# Patient Record
Sex: Male | Born: 1950 | Race: White | Hispanic: No | Smoking: Former smoker
Health system: Southern US, Community
[De-identification: ages and names within clinical notes are randomized; demographics above are authoritative.]

## PROBLEM LIST (undated history)

## (undated) DIAGNOSIS — I10 Essential (primary) hypertension: Secondary | ICD-10-CM

## (undated) DIAGNOSIS — J159 Unspecified bacterial pneumonia: Secondary | ICD-10-CM

## (undated) DIAGNOSIS — Z9889 Other specified postprocedural states: Secondary | ICD-10-CM

## (undated) DIAGNOSIS — J9811 Atelectasis: Secondary | ICD-10-CM

## (undated) DIAGNOSIS — E785 Hyperlipidemia, unspecified: Secondary | ICD-10-CM

## (undated) DIAGNOSIS — J449 Chronic obstructive pulmonary disease, unspecified: Secondary | ICD-10-CM

## (undated) DIAGNOSIS — K219 Gastro-esophageal reflux disease without esophagitis: Secondary | ICD-10-CM

## (undated) DIAGNOSIS — M1990 Unspecified osteoarthritis, unspecified site: Secondary | ICD-10-CM

## (undated) DIAGNOSIS — J45909 Unspecified asthma, uncomplicated: Secondary | ICD-10-CM

## (undated) DIAGNOSIS — J1282 Pneumonia due to coronavirus disease 2019: Secondary | ICD-10-CM

## (undated) DIAGNOSIS — R7303 Prediabetes: Secondary | ICD-10-CM

## (undated) DIAGNOSIS — R112 Nausea with vomiting, unspecified: Secondary | ICD-10-CM

## (undated) DIAGNOSIS — G629 Polyneuropathy, unspecified: Secondary | ICD-10-CM

## (undated) DIAGNOSIS — U071 COVID-19: Secondary | ICD-10-CM

## (undated) DIAGNOSIS — C61 Malignant neoplasm of prostate: Secondary | ICD-10-CM

## (undated) DIAGNOSIS — F4310 Post-traumatic stress disorder, unspecified: Secondary | ICD-10-CM

## (undated) HISTORY — PX: OTHER SURGICAL HISTORY: SHX169

---

## 2021-08-01 ENCOUNTER — Encounter (HOSPITAL_COMMUNITY): Payer: Self-pay

## 2021-08-01 ENCOUNTER — Other Ambulatory Visit: Payer: Self-pay

## 2021-08-01 ENCOUNTER — Ambulatory Visit (HOSPITAL_COMMUNITY)
Admission: EM | Admit: 2021-08-01 | Discharge: 2021-08-01 | Disposition: A | Discharge status: Home / Self Care | Payer: Medicare Other

## 2021-08-01 DIAGNOSIS — G629 Polyneuropathy, unspecified: Secondary | ICD-10-CM | POA: Diagnosis not present

## 2021-08-01 DIAGNOSIS — G479 Sleep disorder, unspecified: Secondary | ICD-10-CM | POA: Diagnosis not present

## 2021-08-01 DIAGNOSIS — Z760 Encounter for issue of repeat prescription: Secondary | ICD-10-CM | POA: Diagnosis not present

## 2021-08-01 DIAGNOSIS — G2581 Restless legs syndrome: Secondary | ICD-10-CM

## 2021-08-01 HISTORY — DX: Unspecified asthma, uncomplicated: J45.909

## 2021-08-01 HISTORY — DX: Malignant neoplasm of prostate: C61

## 2021-08-01 HISTORY — DX: Hyperlipidemia, unspecified: E78.5

## 2021-08-01 HISTORY — DX: Essential (primary) hypertension: I10

## 2021-08-01 HISTORY — DX: Gastro-esophageal reflux disease without esophagitis: K21.9

## 2021-08-01 MED ORDER — GABAPENTIN 300 MG PO CAPS
ORAL_CAPSULE | ORAL | 0 refills | Status: DC
Start: 2021-08-01 — End: 2023-03-30

## 2021-08-01 MED ORDER — TIZANIDINE HCL 2 MG PO CAPS: 2.0000 mg | ORAL_CAPSULE | Freq: Every evening | ORAL | 0 refills | Status: DC | PRN

## 2021-08-01 NOTE — ED Provider Notes (Signed)
[place]    CSN: [id number] Arrival date & time: 08/01/21  1558      History   Chief Complaint Chief Complaint  Patient presents with   Medication Refill    HPI [facility] Charles Weiss. is a 70 y.o. male.   Patient presents today requesting a refill of gabapentin.  He is new to the area and is in the process of establishing with a new primary care provider and has an appointment scheduled for Tuesday (08/05/2021).  He reports the medications he is primarily concerned about are the ones that manage his pain he has been having difficulty sleeping and is concerned that running out of these medications will exacerbate symptoms.  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  He also reports severe restless leg syndrome, that is only minimally improved with gabapentin.  He reports minimal sleep as result of symptoms and feels compelled to walk throughout the night as he gets a stinging/burning sensation throughout his legs.  He is open to any medications that may help provide some relief of the symptoms and allow him to rest.  He does have a history of hypertension but continues to take his medication as prescribed.  He intends to follow-up with primary care provider for ongoing management of this condition.  Denies any chest pain, shortness of breath, leg swelling.   Past Medical History:  Diagnosis Date   Asthma    GERD (gastroesophageal reflux disease)    Hyperlipidemia    Hypertension    Prostate cancer ([place])     There are no problems to display for this patient.   History reviewed. No pertinent surgical history.     Home Medications    Prior to Admission medications   Medication Sig Start Date End Date Taking? Authorizing Provider   acetaminophen (TYLENOL) 650 MG CR tablet Take 650 mg by mouth every 8 (eight) hours as needed for pain.   Yes [provider]  albuterol (VENTOLIN HFA) 108 (90 Base) MCG/ACT inhaler Inhale 2 puffs into the lungs every 6 (six) hours as needed for wheezing or shortness of breath.   Yes [provider]  Albuterol Sulfate 2.5 MG/0.5ML NEBU Take 1 each by nebulization.   Yes [provider]  amLODipine (NORVASC) 5 MG tablet Take 5 mg by mouth daily.   Yes [provider]  aspirin 81 MG chewable tablet Chew by mouth daily.   Yes [provider]  Cholecalciferol (VITAMIN D3) 25 MCG (1000 UT) CAPS Take by mouth.   Yes [provider]  ciclesonide (ALVESCO) 80 MCG/ACT inhaler Inhale 1 puff into the lungs 2 (two) times daily.   Yes [provider]  Dextran 70-Hypromellose 0.1-0.3 % SOLN Apply to eye.   Yes [provider]  docusate sodium (COLACE) 100 MG capsule Take 100 mg by mouth 2 (two) times daily.   Yes [provider]  hydrochlorothiazide (MICROZIDE) 12.5 MG capsule Take 12.5 mg by mouth daily.   Yes [provider]  indomethacin (INDOCIN SR) 75 MG CR capsule Take 75 mg by mouth 2 (two) times daily with a meal.   Yes [provider]  ipratropium (ATROVENT) 0.02 % nebulizer solution Take 0.5 mg by nebulization 4 (four) times daily.   Yes [provider]  ketotifen (ZADITOR) 0.025 % ophthalmic solution 1 drop 2 (two) times daily.   Yes [provider]  loratadine (CLARITIN) 10 MG tablet Take 10 mg by mouth daily.   Yes [provider]  losartan (COZAAR) 50 MG tablet Take 50 mg by mouth daily.   Yes [provider]  mirabegron ER (MYRBETRIQ) 25 MG TB24 tablet Take 25 mg by mouth daily.   Yes [provider]  omeprazole (PRILOSEC) 20 MG capsule Take 20 mg by mouth daily.   Yes [provider]  phenazopyridine (PYRIDIUM) 200 MG tablet Take 200 mg by mouth  3 (three) times daily as needed for pain.   Yes [provider]  polyethylene glycol (MIRALAX / GLYCOLAX) 17 g packet Take 17 g by mouth daily.   Yes [provider]  senna-docusate (SENOKOT-S) 8.6-50 MG tablet Take 1 tablet by mouth daily.   Yes [provider]  simvastatin (ZOCOR) 40 MG tablet Take 40 mg by mouth daily.   Yes [provider]  tamsulosin (FLOMAX) 0.4 MG CAPS capsule Take 0.4 mg by mouth.   Yes [provider]  tizanidine (ZANAFLEX) 2 MG capsule Take 1 capsule (2 mg total) by mouth at bedtime as needed for muscle spasms. 08/01/21  Yes Rosina Cressler K, PA-C  triamcinolone (NASACORT) 55 MCG/ACT AERO nasal inhaler Place 2 sprays into the nose daily.   Yes [provider]  trospium (SANCTURA) 20 MG tablet Take 20 mg by mouth 2 (two) times daily.   Yes [provider]  gabapentin (NEURONTIN) 300 MG capsule Take 1 tablet (300 mg) in the morning and 2 tablets (600 mg) before bed. 08/01/21   Jahzier Villalon, Derry Skill, PA-C    Family History History reviewed. No pertinent family history.  Social History Social History   Tobacco Use   Smoking status: Every Day    Types: Cigarettes   Smokeless tobacco: Never  Vaping Use   Vaping Use: Never used  Substance Use Topics   Alcohol use: Never   Drug use: Never     Allergies   Elavil [amitriptyline] and Tetracyclines & related   Review of Systems Review of Systems  Constitutional:  Positive for activity change and fatigue. Negative for appetite change and fever.  Respiratory:  Negative for cough and shortness of breath.   Cardiovascular:  Negative for chest pain, palpitations and leg swelling.  Musculoskeletal:  Positive for myalgias. Negative for arthralgias.  Neurological:  Negative for dizziness, light-headedness and headaches.  Psychiatric/Behavioral:  Positive for sleep disturbance.     Physical Exam Triage Vital Signs ED Triage Vitals  Enc Vitals Group     BP  08/01/21 1715 (!) 147/100     Pulse Rate 08/01/21 1715 90     Resp 08/01/21 1715 20     Temp 08/01/21 1715 98.5 F (36.9 C)     Temp Source 08/01/21 1715 Oral     SpO2 08/01/21 1715 97 %     Weight --      Height --      Head Circumference --      Peak Flow --      Pain Score 08/01/21 1709 0     Pain Loc --      Pain Edu? --      Excl. in [place]? --    No data found.  Updated Vital Signs BP (!) 147/100 (BP Location: Right Arm)   Pulse 90   Temp 98.5 F (36.9 C) (Oral)   Resp  20   SpO2 97%   Visual Acuity Right Eye Distance:   Left Eye Distance:   Bilateral Distance:    Right Eye Near:   Left Eye Near:    Bilateral Near:     Physical Exam Vitals reviewed.  Constitutional:      General: He is awake.     Appearance: Normal appearance. He is normal weight. He is not ill-appearing.     Comments: Very pleasant male appears stated age in no acute distress sitting comfortably in exam room  HENT:     Head: Normocephalic and atraumatic.     Mouth/Throat:     Pharynx: No oropharyngeal exudate, posterior oropharyngeal erythema or uvula swelling.  Cardiovascular:     Rate and Rhythm: Normal rate and regular rhythm.     Heart sounds: Normal heart sounds, S1 normal and S2 normal. No murmur heard.    Comments: Negative Homans' sign bilaterally Pulmonary:     Effort: Pulmonary effort is normal.     Breath sounds: Normal breath sounds. No stridor. No wheezing, rhonchi or rales.     Comments: Clear to auscultation bilaterally Abdominal:     Palpations: Abdomen is soft.     Tenderness: There is no abdominal tenderness.  Musculoskeletal:     Right lower leg: No deformity, tenderness or bony tenderness. No edema.     Left lower leg: No deformity, tenderness or bony tenderness. No edema.  Neurological:     Mental Status: He is alert.  Psychiatric:        Behavior: Behavior is cooperative.     UC Treatments / Results  Labs (all labs ordered are listed, but only abnormal  results are displayed) Labs Reviewed - No data to display  EKG   Radiology No results found.  Procedures Procedures (including critical care time)  Medications Ordered in UC Medications - No data to display  Initial Impression / Assessment and Plan / UC Course  I have reviewed the triage vital signs and the nursing notes.  Pertinent labs & imaging results that were available during my care of the patient were reviewed by me and considered in my medical decision making (see chart for details).      Patient was prescribed slightly higher dose of gabapentin to help manage symptoms with 300 mg for the day and 600 mg at night.  We will try low-dose tizanidine (2 mg) at night to see if this will help provide some relief of symptoms and allow him to rest.  Discussed conservative treatment measures including heating pads, stretch, massage for additional symptom relief.  Discussed the importance of following up with primary care for ongoing medication management.  Discussed alarm symptoms that warrant emergent evaluation.  Strict return precautions given to which patient expressed understanding.  Final Clinical Impressions(s) / UC Diagnoses   Final diagnoses:  Neuropathy  Restless leg  Sleep disturbance  Medication refill     Discharge Instructions      Take a slightly higher dose of gabapentin as we discussed to see if this will help manage your symptoms.  It is very important that you follow-up with primary care as we discussed.  I have called in tizanidine to be used at night.  This make you sleepy so do not drive or drink alcohol while taking it.  I recommend that you use heat, rest, stretch for additional symptom relief.  If you have any worsening symptoms please return for reevaluation as we discussed.     ED  Prescriptions     Medication Sig Dispense Auth. Provider   gabapentin (NEURONTIN) 300 MG capsule Take 1 tablet (300 mg) in the morning and 2 tablets (600 mg) before  bed. 90 capsule Muath Hallam K, PA-C   tizanidine (ZANAFLEX) 2 MG capsule Take 1 capsule (2 mg total) by mouth at bedtime as needed for muscle spasms. 10 capsule Latronda Spink, Derry Skill, PA-C      PDMP not reviewed this encounter.   Terrilee Croak, PA-C 08/01/21 1838

## 2021-08-01 NOTE — Discharge Instructions (Addendum)
Take a slightly higher dose of gabapentin as we discussed to see if this will help manage your symptoms.  It is very important that you follow-up with primary care as we discussed.  I have called in tizanidine to be used at night.  This make you sleepy so do not drive or drink alcohol while taking it.  I recommend that you use heat, rest, stretch for additional symptom relief.  If you have any worsening symptoms please return for reevaluation as we discussed.

## 2021-08-01 NOTE — ED Triage Notes (Signed)
Pt requesting medication refill for gabapentin.

## 2021-10-24 ENCOUNTER — Emergency Department (HOSPITAL_COMMUNITY): Payer: Medicare Other

## 2021-10-24 ENCOUNTER — Other Ambulatory Visit: Payer: Self-pay

## 2021-10-24 ENCOUNTER — Inpatient Hospital Stay (HOSPITAL_COMMUNITY)
Admission: EM | Admit: 2021-10-24 | Discharge: 2021-10-28 | DRG: 193 | Disposition: A | Discharge status: Home Health | Payer: Medicare Other | Attending: Internal Medicine | Admitting: Internal Medicine

## 2021-10-24 ENCOUNTER — Encounter (HOSPITAL_COMMUNITY): Payer: Self-pay | Admitting: Internal Medicine

## 2021-10-24 DIAGNOSIS — K5900 Constipation, unspecified: Secondary | ICD-10-CM | POA: Diagnosis present

## 2021-10-24 DIAGNOSIS — E871 Hypo-osmolality and hyponatremia: Secondary | ICD-10-CM | POA: Diagnosis present

## 2021-10-24 DIAGNOSIS — J101 Influenza due to other identified influenza virus with other respiratory manifestations: Secondary | ICD-10-CM | POA: Diagnosis present

## 2021-10-24 DIAGNOSIS — K219 Gastro-esophageal reflux disease without esophagitis: Secondary | ICD-10-CM | POA: Diagnosis present

## 2021-10-24 DIAGNOSIS — Z8546 Personal history of malignant neoplasm of prostate: Secondary | ICD-10-CM | POA: Diagnosis not present

## 2021-10-24 DIAGNOSIS — E876 Hypokalemia: Secondary | ICD-10-CM | POA: Diagnosis present

## 2021-10-24 DIAGNOSIS — Z8616 Personal history of COVID-19: Secondary | ICD-10-CM | POA: Diagnosis not present

## 2021-10-24 DIAGNOSIS — E669 Obesity, unspecified: Secondary | ICD-10-CM | POA: Diagnosis present

## 2021-10-24 DIAGNOSIS — E785 Hyperlipidemia, unspecified: Secondary | ICD-10-CM | POA: Diagnosis present

## 2021-10-24 DIAGNOSIS — Z6833 Body mass index (BMI) 33.0-33.9, adult: Secondary | ICD-10-CM

## 2021-10-24 DIAGNOSIS — I10 Essential (primary) hypertension: Secondary | ICD-10-CM | POA: Diagnosis present

## 2021-10-24 DIAGNOSIS — Z79899 Other long term (current) drug therapy: Secondary | ICD-10-CM

## 2021-10-24 DIAGNOSIS — Z20822 Contact with and (suspected) exposure to covid-19: Secondary | ICD-10-CM | POA: Diagnosis present

## 2021-10-24 DIAGNOSIS — J9601 Acute respiratory failure with hypoxia: Secondary | ICD-10-CM | POA: Diagnosis present

## 2021-10-24 DIAGNOSIS — D649 Anemia, unspecified: Secondary | ICD-10-CM | POA: Diagnosis present

## 2021-10-24 DIAGNOSIS — Z8249 Family history of ischemic heart disease and other diseases of the circulatory system: Secondary | ICD-10-CM | POA: Diagnosis not present

## 2021-10-24 DIAGNOSIS — Z888 Allergy status to other drugs, medicaments and biological substances status: Secondary | ICD-10-CM

## 2021-10-24 DIAGNOSIS — Z7982 Long term (current) use of aspirin: Secondary | ICD-10-CM

## 2021-10-24 DIAGNOSIS — J45901 Unspecified asthma with (acute) exacerbation: Secondary | ICD-10-CM | POA: Diagnosis present

## 2021-10-24 DIAGNOSIS — F17210 Nicotine dependence, cigarettes, uncomplicated: Secondary | ICD-10-CM | POA: Diagnosis present

## 2021-10-24 DIAGNOSIS — J45909 Unspecified asthma, uncomplicated: Secondary | ICD-10-CM | POA: Diagnosis present

## 2021-10-24 DIAGNOSIS — R0602 Shortness of breath: Secondary | ICD-10-CM

## 2021-10-24 HISTORY — DX: Unspecified bacterial pneumonia: J15.9

## 2021-10-24 HISTORY — DX: Atelectasis: J98.11

## 2021-10-24 HISTORY — DX: COVID-19: U07.1

## 2021-10-24 HISTORY — DX: Pneumonia due to coronavirus disease 2019: J12.82

## 2021-10-24 LAB — CBC WITH DIFFERENTIAL/PLATELET
Abs Immature Granulocytes: 0.01 10*3/uL (ref 0.00–0.07)
Basophils Absolute: 0 10*3/uL (ref 0.0–0.1)
Basophils Relative: 1 %
Eosinophils Absolute: 0 10*3/uL (ref 0.0–0.5)
Eosinophils Relative: 0 %
HCT: 34.8 % — ABNORMAL LOW (ref 39.0–52.0)
Hemoglobin: 11.9 g/dL — ABNORMAL LOW (ref 13.0–17.0)
Immature Granulocytes: 0 %
Lymphocytes Relative: 15 %
Lymphs Abs: 0.6 10*3/uL — ABNORMAL LOW (ref 0.7–4.0)
MCH: 31 pg (ref 26.0–34.0)
MCHC: 34.2 g/dL (ref 30.0–36.0)
MCV: 90.6 fL (ref 80.0–100.0)
Monocytes Absolute: 0.7 10*3/uL (ref 0.1–1.0)
Monocytes Relative: 17 %
Neutro Abs: 2.8 10*3/uL (ref 1.7–7.7)
Neutrophils Relative %: 67 %
Platelets: 151 10*3/uL (ref 150–400)
RBC: 3.84 MIL/uL — ABNORMAL LOW (ref 4.22–5.81)
RDW: 14.6 % (ref 11.5–15.5)
WBC: 4.2 10*3/uL (ref 4.0–10.5)
nRBC: 0 % (ref 0.0–0.2)

## 2021-10-24 LAB — COMPREHENSIVE METABOLIC PANEL
ALT: 27 U/L (ref 0–44)
AST: 33 U/L (ref 15–41)
Albumin: 3.5 g/dL (ref 3.5–5.0)
Alkaline Phosphatase: 64 U/L (ref 38–126)
Anion gap: 12 (ref 5–15)
BUN: 18 mg/dL (ref 8–23)
CO2: 23 mmol/L (ref 22–32)
Calcium: 8.6 mg/dL — ABNORMAL LOW (ref 8.9–10.3)
Chloride: 98 mmol/L (ref 98–111)
Creatinine, Ser: 1.03 mg/dL (ref 0.61–1.24)
GFR, Estimated: >60 mL/min (ref >60)
Glucose, Bld: 148 mg/dL — ABNORMAL HIGH (ref 70–99)
Potassium: 3.1 mmol/L — ABNORMAL LOW (ref 3.5–5.1)
Sodium: 133 mmol/L — ABNORMAL LOW (ref 135–145)
Total Bilirubin: 0.5 mg/dL (ref 0.3–1.2)
Total Protein: 6.7 g/dL (ref 6.5–8.1)

## 2021-10-24 LAB — RESP PANEL BY RT-PCR (FLU A&B, COVID) ARPGX2
Influenza A by PCR: POSITIVE — AB
Influenza B by PCR: NEGATIVE
SARS Coronavirus 2 by RT PCR: NEGATIVE

## 2021-10-24 LAB — TROPONIN I (HIGH SENSITIVITY)
Troponin I (High Sensitivity): 12 ng/L (ref <18)
Troponin I (High Sensitivity): 11 ng/L (ref <18)

## 2021-10-24 LAB — BRAIN NATRIURETIC PEPTIDE: B Natriuretic Peptide: 59 pg/mL (ref 0.0–100.0)

## 2021-10-24 LAB — PHOSPHORUS: Phosphorus: 4 mg/dL (ref 2.5–4.6)

## 2021-10-24 LAB — MAGNESIUM: Magnesium: 2 mg/dL (ref 1.7–2.4)

## 2021-10-24 IMAGING — DX DG CHEST 1V PORT
1 series · 1 of 1 positions shown · non-contrast
Comparison: None.

CLINICAL DATA: Shortness of breath

EXAM:
PORTABLE CHEST 1 VIEW

[chest ap]
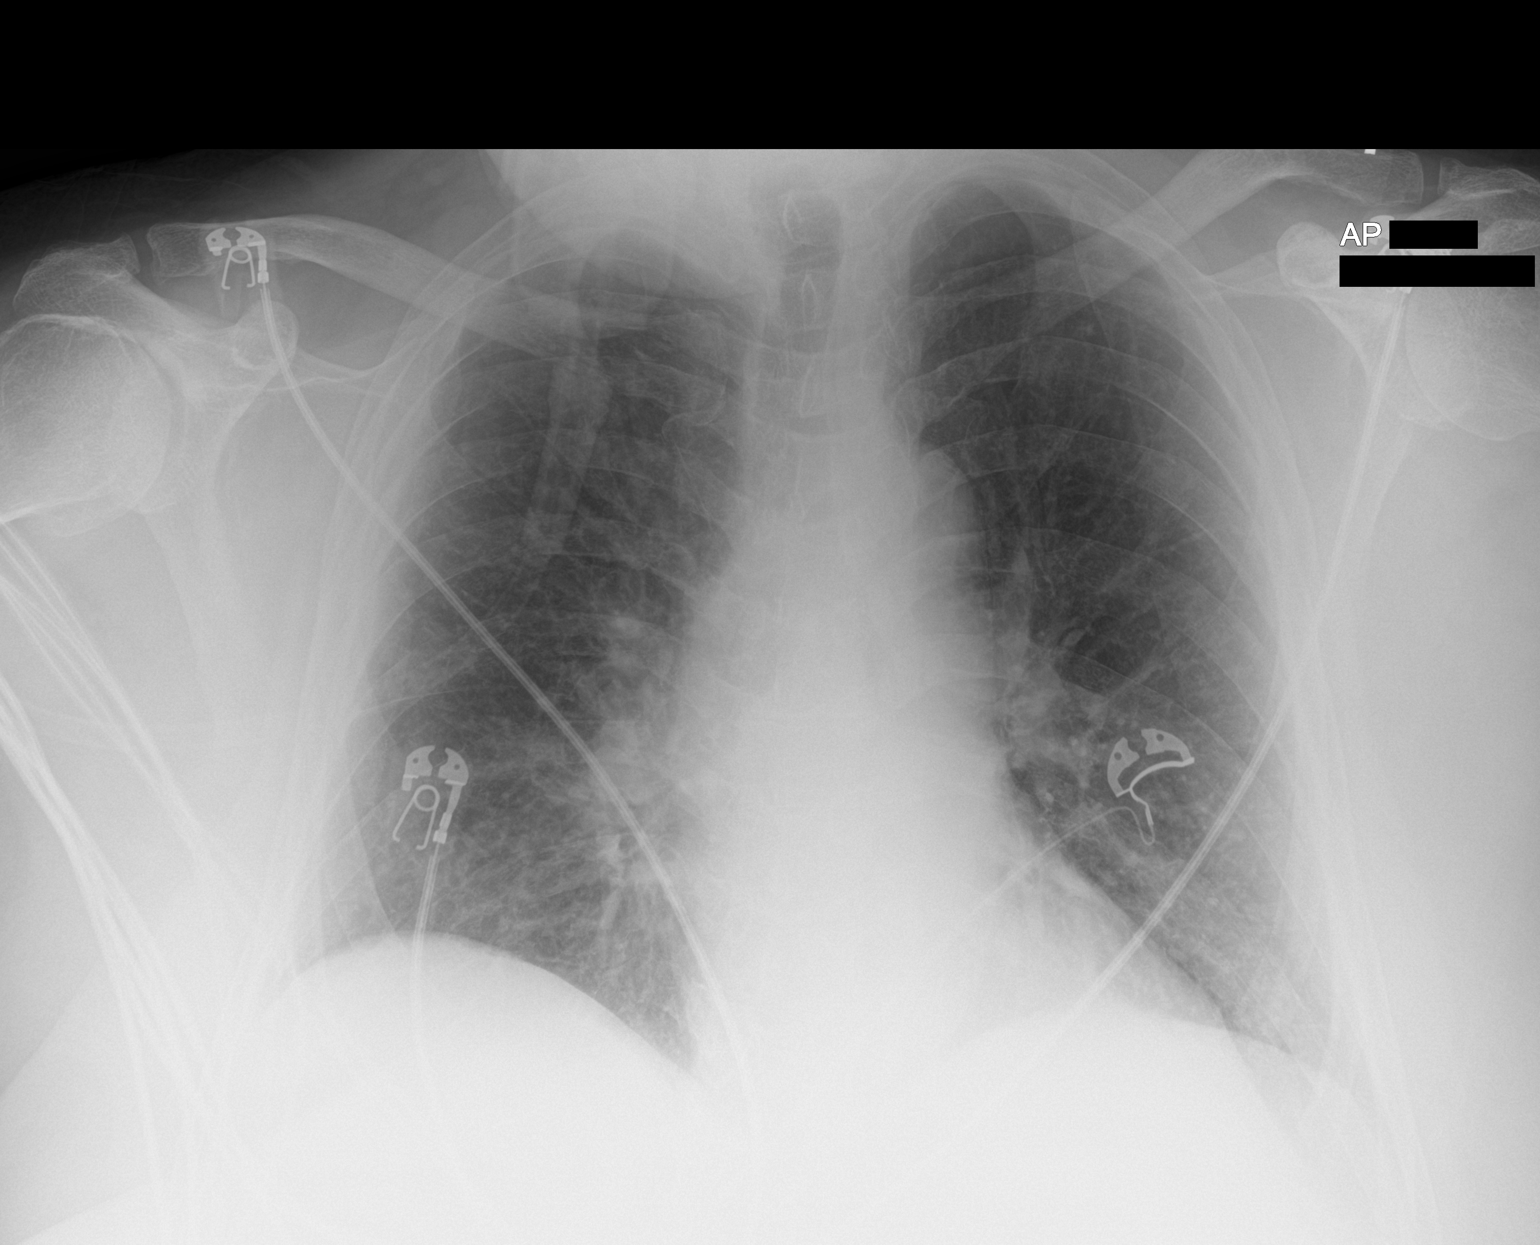

[1 of 1 positions shown; findings below may reference images not displayed]

FINDINGS: The heart size and mediastinal contours are within normal limits.
Both lungs are clear. The visualized skeletal structures are
unremarkable.
IMPRESSION: No active disease.

## 2021-10-24 MED ORDER — SODIUM CHLORIDE 0.9 % IV BOLUS
500.0000 mL | Freq: Once | INTRAVENOUS | Status: AC
  Administered 2021-10-24: 500 mL via INTRAVENOUS

## 2021-10-24 MED ORDER — BENZONATATE 100 MG PO CAPS
200.0000 mg | ORAL_CAPSULE | Freq: Three times a day (TID) | ORAL | Status: DC | PRN
  Filled 2021-10-24: qty 2

## 2021-10-24 MED ORDER — HYDROCOD POLST-CPM POLST ER 10-8 MG/5ML PO SUER
5.0000 mL | Freq: Two times a day (BID) | ORAL | Status: DC | PRN
  Administered 2021-10-24 – 2021-10-28 (×8): 5 mL via ORAL
  Filled 2021-10-24 (×8): qty 5

## 2021-10-24 MED ORDER — GABAPENTIN 300 MG PO CAPS
600.0000 mg | ORAL_CAPSULE | Freq: Every day | ORAL | Status: DC
  Administered 2021-10-24 – 2021-10-27 (×4): 600 mg via ORAL
  Filled 2021-10-24 (×4): qty 2

## 2021-10-24 MED ORDER — GABAPENTIN 300 MG PO CAPS
300.0000 mg | ORAL_CAPSULE | Freq: Every day | ORAL | Status: DC
  Administered 2021-10-24 – 2021-10-28 (×5): 300 mg via ORAL
  Filled 2021-10-24 (×5): qty 1

## 2021-10-24 MED ORDER — IPRATROPIUM-ALBUTEROL 0.5-2.5 (3) MG/3ML IN SOLN
3.0000 mL | Freq: Once | RESPIRATORY_TRACT | Status: AC
  Administered 2021-10-24: 3 mL via RESPIRATORY_TRACT
  Filled 2021-10-24: qty 3

## 2021-10-24 MED ORDER — SIMVASTATIN 40 MG PO TABS
40.0000 mg | ORAL_TABLET | Freq: Every day | ORAL | Status: DC
  Administered 2021-10-24: 40 mg via ORAL
  Filled 2021-10-24: qty 2

## 2021-10-24 MED ORDER — POTASSIUM CHLORIDE IN NACL 20-0.9 MEQ/L-% IV SOLN
INTRAVENOUS | Status: DC
  Filled 2021-10-24 (×2): qty 1000

## 2021-10-24 MED ORDER — LOSARTAN POTASSIUM 50 MG PO TABS
50.0000 mg | ORAL_TABLET | Freq: Every day | ORAL | Status: DC
  Administered 2021-10-24 – 2021-10-28 (×5): 50 mg via ORAL
  Filled 2021-10-24 (×4): qty 1
  Filled 2021-10-24: qty 2

## 2021-10-24 MED ORDER — METHYLPREDNISOLONE SODIUM SUCC 40 MG IJ SOLR
40.0000 mg | Freq: Once | INTRAMUSCULAR | Status: AC
Start: 2021-10-24 — End: 2021-10-24
  Administered 2021-10-24: 40 mg via INTRAVENOUS
  Filled 2021-10-24: qty 1

## 2021-10-24 MED ORDER — POTASSIUM CHLORIDE CRYS ER 20 MEQ PO TBCR
40.0000 meq | EXTENDED_RELEASE_TABLET | Freq: Once | ORAL | Status: AC
  Administered 2021-10-24: 40 meq via ORAL
  Filled 2021-10-24: qty 2

## 2021-10-24 MED ORDER — ONDANSETRON HCL 4 MG/2ML IJ SOLN: 4.0000 mg | Freq: Four times a day (QID) | INTRAMUSCULAR | Status: DC | PRN

## 2021-10-24 MED ORDER — PHENOL 1.4 % MT LIQD
1.0000 | OROMUCOSAL | Status: DC | PRN
  Administered 2021-10-24: 1 via OROMUCOSAL
  Filled 2021-10-24: qty 177

## 2021-10-24 MED ORDER — ASPIRIN 81 MG PO CHEW
81.0000 mg | CHEWABLE_TABLET | Freq: Every day | ORAL | Status: DC
  Administered 2021-10-24 – 2021-10-28 (×5): 81 mg via ORAL
  Filled 2021-10-24 (×5): qty 1

## 2021-10-24 MED ORDER — BENZONATATE 100 MG PO CAPS
200.0000 mg | ORAL_CAPSULE | Freq: Once | ORAL | Status: AC
  Administered 2021-10-24: 200 mg via ORAL
  Filled 2021-10-24: qty 2

## 2021-10-24 MED ORDER — AMLODIPINE BESYLATE 5 MG PO TABS
5.0000 mg | ORAL_TABLET | Freq: Every day | ORAL | Status: DC
  Administered 2021-10-25 – 2021-10-28 (×4): 5 mg via ORAL
  Filled 2021-10-24 (×4): qty 1

## 2021-10-24 MED ORDER — ENOXAPARIN SODIUM 60 MG/0.6ML IJ SOSY
60.0000 mg | PREFILLED_SYRINGE | INTRAMUSCULAR | Status: DC
  Administered 2021-10-24 – 2021-10-28 (×5): 60 mg via SUBCUTANEOUS
  Filled 2021-10-24 (×5): qty 0.6

## 2021-10-24 MED ORDER — ONDANSETRON HCL 4 MG PO TABS: 4.0000 mg | ORAL_TABLET | Freq: Four times a day (QID) | ORAL | Status: DC | PRN

## 2021-10-24 MED ORDER — ACETAMINOPHEN 325 MG PO TABS
650.0000 mg | ORAL_TABLET | Freq: Four times a day (QID) | ORAL | Status: DC | PRN
  Administered 2021-10-24 – 2021-10-28 (×5): 650 mg via ORAL
  Filled 2021-10-24 (×5): qty 2

## 2021-10-24 MED ORDER — GABAPENTIN 300 MG PO CAPS
300.0000 mg | ORAL_CAPSULE | ORAL | Status: DC
Start: 2021-10-24 — End: 2021-10-24

## 2021-10-24 MED ORDER — MAGNESIUM SULFATE 2 GM/50ML IV SOLN
2.0000 g | Freq: Once | INTRAVENOUS | Status: AC
  Administered 2021-10-24: 2 g via INTRAVENOUS
  Filled 2021-10-24: qty 50

## 2021-10-24 MED ORDER — PANTOPRAZOLE SODIUM 40 MG PO TBEC
40.0000 mg | DELAYED_RELEASE_TABLET | Freq: Every day | ORAL | Status: DC
  Administered 2021-10-24 – 2021-10-28 (×5): 40 mg via ORAL
  Filled 2021-10-24 (×5): qty 1

## 2021-10-24 MED ORDER — ALBUTEROL SULFATE (2.5 MG/3ML) 0.083% IN NEBU
2.5000 mg | INHALATION_SOLUTION | Freq: Once | RESPIRATORY_TRACT | Status: AC
  Administered 2021-10-24: 2.5 mg via RESPIRATORY_TRACT
  Filled 2021-10-24: qty 3

## 2021-10-24 MED ORDER — ACETAMINOPHEN 325 MG PO TABS
650.0000 mg | ORAL_TABLET | Freq: Once | ORAL | Status: AC
  Administered 2021-10-24: 650 mg via ORAL
  Filled 2021-10-24: qty 2

## 2021-10-24 MED ORDER — GUAIFENESIN 100 MG/5ML PO LIQD
10.0000 mL | Freq: Once | ORAL | Status: AC
  Administered 2021-10-24: 10 mL via ORAL
  Filled 2021-10-24: qty 10

## 2021-10-24 MED ORDER — OSELTAMIVIR PHOSPHATE 75 MG PO CAPS
75.0000 mg | ORAL_CAPSULE | Freq: Two times a day (BID) | ORAL | Status: DC
  Administered 2021-10-24 – 2021-10-26 (×5): 75 mg via ORAL
  Filled 2021-10-24 (×6): qty 1

## 2021-10-24 MED ORDER — SODIUM CHLORIDE 0.9 % IV SOLN
12.5000 mg | Freq: Once | INTRAVENOUS | Status: AC
  Administered 2021-10-24: 12.5 mg via INTRAVENOUS
  Filled 2021-10-24: qty 12.5

## 2021-10-24 MED ORDER — ATORVASTATIN CALCIUM 10 MG PO TABS
20.0000 mg | ORAL_TABLET | Freq: Every day | ORAL | Status: DC
  Administered 2021-10-25 – 2021-10-28 (×4): 20 mg via ORAL
  Filled 2021-10-24 (×4): qty 2

## 2021-10-24 MED ORDER — KETOTIFEN FUMARATE 0.025 % OP SOLN: 1.0000 [drp] | Freq: Two times a day (BID) | OPHTHALMIC | Status: DC | PRN

## 2021-10-24 MED ORDER — ACETAMINOPHEN 650 MG RE SUPP: 650.0000 mg | Freq: Four times a day (QID) | RECTAL | Status: DC | PRN

## 2021-10-24 MED ORDER — IPRATROPIUM BROMIDE 0.02 % IN SOLN
0.5000 mg | RESPIRATORY_TRACT | Status: DC | PRN
  Administered 2021-10-24: 0.5 mg via RESPIRATORY_TRACT
  Filled 2021-10-24 (×2): qty 2.5

## 2021-10-24 MED ORDER — ALBUTEROL SULFATE (2.5 MG/3ML) 0.083% IN NEBU
2.5000 mg | INHALATION_SOLUTION | RESPIRATORY_TRACT | Status: DC | PRN
  Filled 2021-10-24: qty 3

## 2021-10-24 NOTE — ED Triage Notes (Signed)
Patient BIB [facility] for evaluation of SHOB and cough x 2 days.  Reports hx of asthma.  Has recently taken care "of a sick room mate."  Recent negative COVID test.  Having increased non productive cough.  No fevers.  Increased SHOB with walking and go upstairs.  Given DuoNeb x 2, SoluMedrol 125 mg IV, and Magnesium 2 grams IV by EMS.  Reports slight improvement in symptoms

## 2021-10-24 NOTE — ED Notes (Signed)
Placed pt on 2L oxygen nasal cannula due to desaturation to 86% on room air. O2 increased to 98% on 2L

## 2021-10-24 NOTE — ED Provider Notes (Signed)
[place] DEPT Provider Note   CSN: [id number] Arrival date & time: 10/24/21  0016     History Chief Complaint  Patient presents with   Cough   Shortness of Breath    [facility] Charles Lucci. is a 70 y.o. male.  The history is provided by the patient and medical records.  Cough Associated symptoms: shortness of breath   Shortness of Breath Associated symptoms: cough   Charles Durrell Pleasant Bensinger. is a 70 y.o. male who presents to the Emergency Department complaining of sob.  He presents to the ED by EMS for evaluation of sob and cough.  Sxs have been present for the last 3-4 days.  Cough is dry in nature.  Has sob at rest.  Has chest pain with coughing.  Has subjective fever.  No N/V, abdominal pain.  Poor appetite.  No leg swelling.  Does have a sick contact. Symptoms are severe, constant, worsening.  Lives at home.        Past Medical History:  Diagnosis Date   Asthma    GERD (gastroesophageal reflux disease)    Hyperlipidemia    Hypertension    Prostate cancer ([place])     There are no problems to display for this patient.   No past surgical history on file.     No family history on file.  Social History   Tobacco Use   Smoking status: Every Day    Types: Cigarettes   Smokeless tobacco: Never  Vaping Use   Vaping Use: Never used  Substance Use Topics   Alcohol use: Never   Drug use: Never    Home Medications Prior to Admission medications   Medication Sig Start Date End Date Taking? Authorizing Provider  acetaminophen (TYLENOL) 650 MG CR tablet Take 650 mg by mouth every 8 (eight) hours as needed for pain.    [provider]  albuterol (VENTOLIN HFA) 108 (90 Base) MCG/ACT inhaler Inhale 2 puffs into the lungs every 6 (six) hours as needed for wheezing or shortness of breath.    [provider]  Albuterol Sulfate 2.5 MG/0.5ML NEBU Take 1 each by nebulization.    [provider]  amLODipine  (NORVASC) 5 MG tablet Take 5 mg by mouth daily.    [provider]  aspirin 81 MG chewable tablet Chew by mouth daily.    [provider]  Cholecalciferol (VITAMIN D3) 25 MCG (1000 UT) CAPS Take by mouth.    [provider]  ciclesonide (ALVESCO) 80 MCG/ACT inhaler Inhale 1 puff into the lungs 2 (two) times daily.    [provider]  Dextran 70-Hypromellose 0.1-0.3 % SOLN Apply to eye.    [provider]  docusate sodium (COLACE) 100 MG capsule Take 100 mg by mouth 2 (two) times daily.    [provider]  gabapentin (NEURONTIN) 300 MG capsule Take 1 tablet (300 mg) in the morning and 2 tablets (600 mg) before bed. 08/01/21   Raspet, Junie Panning K, PA-C  hydrochlorothiazide (MICROZIDE) 12.5 MG capsule Take 12.5 mg by mouth daily.    [provider]  indomethacin (INDOCIN SR) 75 MG CR capsule Take 75 mg by mouth 2 (two) times daily with a meal.    [provider]  ipratropium (ATROVENT) 0.02 % nebulizer solution Take 0.5 mg by nebulization 4 (four) times daily.    [provider]  ketotifen (ZADITOR) 0.025 % ophthalmic solution 1 drop 2 (two) times daily.    [provider]  loratadine (CLARITIN) 10 MG tablet Take 10 mg by mouth daily.    [provider]  losartan (COZAAR) 50 MG tablet Take 50 mg by mouth daily.    [provider]  mirabegron ER (MYRBETRIQ) 25 MG TB24 tablet Take 25 mg by mouth daily.    [provider]  omeprazole (PRILOSEC) 20 MG capsule Take 20 mg by mouth daily.    [provider]  phenazopyridine (PYRIDIUM) 200 MG tablet Take 200 mg by mouth 3 (three) times daily as needed for pain.    [provider]  polyethylene glycol (MIRALAX / GLYCOLAX) 17 g packet Take 17 g by mouth daily.    [provider]  senna-docusate (SENOKOT-S) 8.6-50 MG tablet Take 1 tablet by mouth daily.    [provider]  simvastatin (ZOCOR) 40 MG tablet Take 40 mg  by mouth daily.    [provider]  tamsulosin (FLOMAX) 0.4 MG CAPS capsule Take 0.4 mg by mouth.    [provider]  tizanidine (ZANAFLEX) 2 MG capsule Take 1 capsule (2 mg total) by mouth at bedtime as needed for muscle spasms. 08/01/21   Raspet, Derry Skill, PA-C  triamcinolone (NASACORT) 55 MCG/ACT AERO nasal inhaler Place 2 sprays into the nose daily.    [provider]  trospium (SANCTURA) 20 MG tablet Take 20 mg by mouth 2 (two) times daily.    [provider]    Allergies    Elavil [amitriptyline] and Tetracyclines & related  Review of Systems   Review of Systems  Respiratory:  Positive for cough and shortness of breath.   All other systems reviewed and are negative.  Physical Exam Updated Vital Signs BP (!) 105/56 (BP Location: Right Arm)   Pulse 97   Temp 98.5 F (36.9 C) (Oral)   Resp (!) 24   Ht 6' (1.829 m)   Wt 113.4 kg   SpO2 98%   BMI 33.91 kg/m   Physical Exam Vitals and nursing note reviewed.  Constitutional:      Appearance: He is well-developed.  HENT:     Head: Normocephalic and atraumatic.  Cardiovascular:     Rate and Rhythm: Normal rate and regular rhythm.     Heart sounds: No murmur heard. Pulmonary:     Effort: Pulmonary effort is normal. No respiratory distress.     Comments: Diffuse wheezes Abdominal:     Palpations: Abdomen is soft.     Tenderness: There is no abdominal tenderness. There is no guarding or rebound.  Musculoskeletal:        General: No swelling or tenderness.  Skin:    General: Skin is warm and dry.  Neurological:     Mental Status: He is alert and oriented to person, place, and time.  Psychiatric:        Behavior: Behavior normal.    ED Results / Procedures / Treatments   Labs (all labs ordered are listed, but only abnormal results are displayed) Labs Reviewed  RESP PANEL BY RT-PCR (FLU A&B, COVID) ARPGX2 - Abnormal; Notable for the following components:      Result Value   Influenza  A by PCR POSITIVE (*)    All other components within normal limits  COMPREHENSIVE METABOLIC PANEL - Abnormal; Notable for the following components:   Sodium 133 (*)    Potassium 3.1 (*)    Glucose, Bld 148 (*)    Calcium 8.6 (*)    All other components within normal limits  CBC WITH DIFFERENTIAL/PLATELET - Abnormal; Notable for the following components:   RBC 3.84 (*)    Hemoglobin 11.9 (*)    HCT 34.8 (*)    Lymphs Abs 0.6 (*)    All other components within normal limits  BRAIN NATRIURETIC PEPTIDE  TROPONIN I (HIGH SENSITIVITY)  TROPONIN I (HIGH SENSITIVITY)    EKG EKG Interpretation  Date/Time:  Friday October 24 2021 00:28:39 EDT Ventricular Rate:  112 PR Interval:  178 QRS Duration: 99 QT Interval:  335 QTC Calculation: 458 R Axis:   32 Text Interpretation: Sinus tachycardia Confirmed by Quintella Reichert [phone]) on 10/24/2021 1:52:45 AM  Radiology DG Chest Port 1 View  Result Date: 10/24/2021 CLINICAL DATA:  Shortness of breath EXAM: PORTABLE CHEST 1 VIEW COMPARISON:  None. FINDINGS: The heart size and mediastinal contours are within normal limits. Both lungs are clear. The visualized skeletal structures are unremarkable. IMPRESSION: No active disease. Electronically Signed   By: Ulyses Jarred M.D.   On: 10/24/2021 01:32    Procedures Procedures  CRITICAL CARE Performed by: Quintella Reichert   Total critical care time: 35 minutes  Critical care time was exclusive of separately billable procedures and treating other patients.  Critical care was necessary to treat or prevent imminent or life-threatening deterioration.  Critical care was time spent personally by me on the following activities: development of treatment plan with patient and/or surrogate as well as nursing, discussions with consultants, evaluation of patient's response to treatment, examination of patient, obtaining history from patient or surrogate, ordering and performing treatments and interventions,  ordering and review of laboratory studies, ordering and review of radiographic studies, pulse oximetry and re-evaluation of patient's condition.  Medications Ordered in ED Medications  albuterol (PROVENTIL) (2.5 MG/3ML) 0.083% nebulizer solution 2.5 mg (has no administration in time range)  ipratropium-albuterol (DUONEB) 0.5-2.5 (3) MG/3ML nebulizer solution 3 mL (3 mLs Nebulization Given 10/24/21 0055)  sodium chloride 0.9 % bolus 500 mL (0 mLs Intravenous Stopped 10/24/21 0146)  potassium chloride SA (KLOR-CON) CR tablet 40 mEq (40 mEq Oral Given 10/24/21 0305)  benzonatate (TESSALON) capsule 200 mg (200 mg Oral Given 10/24/21 0304)  guaiFENesin (ROBITUSSIN) 100 MG/5ML liquid 10 mL (10 mLs Oral Given 10/24/21 0304)  acetaminophen (TYLENOL) tablet 650 mg (650 mg Oral Given 10/24/21 0335)    ED Course  I have reviewed the triage vital signs and the nursing notes.  Pertinent labs & imaging results that were available during my care of the patient were reviewed by me and considered in my medical decision making (see chart for details).    MDM Rules/Calculators/A&P                          patient with history of asthma, prostate cancer, hypertension here for evaluation of cough, malaise and shortness of breath for the last 3 to 4 days. He is ill appearing on evaluation with tachycardia, wheezes, tachypnea. He is positive for influenza A today. He was treated with albuterol, steroids prehospital with no significant improvement in his symptoms. Developed hypoxia during his ED stay with that down to 84% at rest and he was started on supplemental oxygen. Given his hypoxia plan to admit for ongoing treatment.  Final Clinical Impression(s) / ED Diagnoses Final diagnoses:  Influenza A    Rx / DC Orders ED Discharge Orders     None        Quintella Reichert, MD 10/24/21 [phone]

## 2021-10-24 NOTE — Progress Notes (Signed)
The order for simvastatin(Zocor) was changed to an equivalent dose of atorvastatin(Lipitor) due to the potential drug interaction with amlodipine.  When taken in combination with medications that inhibit its metabolism, simvastatin can accumulate which increases the risk of liver toxicity, myopathy, or rhabdomyolysis.  Simvastatin dose should not exceed 20mg /day in patients taking amlodipine, ranolazine or amiodarone.   Please consider this potential interaction at discharge.  Charles Weiss A 10/24/2021 7:46 PM

## 2021-10-24 NOTE — ED Notes (Signed)
Patient out of bed to use urinal and " move my legs."  Oxygen saturations maintained between 90%-96%

## 2021-10-24 NOTE — H&P (Addendum)
History and Physical    Charles Weiss. TDD:[id number] DOB: [birth year] DOA: 10/24/2021  PCP: System, Provider Not In  Patient coming from: Home.  I have personally briefly reviewed patient's old medical records in [place]  Chief Complaint: Cough and shortness of breath.  HPI: Charles Spadafore Jedi Catalfamo. is a 70 y.o. male with medical history significant of asthma, GERD, prostate cancer, hyperlipidemia, hypertension, COVID-19 pneumonia, history of bacterial pneumonia with right lung collapse who became exposed to a sick contact about 6 days ago and started having generalized weakness, dry cough, sore throat, mild rhinorrhea, fatigue, malaise, poor appetite and subjective fever.  Positive pleuritic chest pain, but no palpitations, diaphoresis, PND, orthopnea or pitting edema of the lower extremities.  He denied abdominal pain, nausea, vomiting, diarrhea, constipation, melena or hematochezia.  No dysuria, frequency or hematuria.  No polyuria, polydipsia, polyphagia or blurred vision.  ED Course: Initial vital signs were temperature 98.5 F, pulse 110, respiration 18, BP 102/70 mmHg and O2 sat 95% on room air.  The patient was given an albuterol nebulizer treatment, a 200 mg Tessalon Perles, 10 mL of guaifenesin, magnesium sulfate 2 g IVPB, K-Lor 40 mEq p.o. x1 and a 500 mL NS bolus.  I added 40 mg of methylprednisolone x1, magnesium sulfate 2 g IVPB and a dose of Phenergan 12.5 mg IVPB.  Lab work: CBC showed a white count of 4.2, hemoglobin 11.9 g/dL platelets 151.  CMP showed normal LFTs and kidney function.  Sodium was 133 and potassium 3.1 mmol/L.  Glucose 148 mg/dL.  Calcium normalized with 1 corrected to albumin.  Troponin x2 and BNP were normal.  Magnesium is 2.0 and phosphorus 4.0 mg/dL.  Imaging: A one-view portable chest radiograph did not show any active disease.  Review of Systems: As per HPI otherwise all other systems reviewed and are negative.  Past Medical History:   Diagnosis Date   Asthma    Collapse of right lung due to pneumonia    GERD (gastroesophageal reflux disease)    Hyperlipidemia    Hypertension    Pneumonia due to COVID-19 virus    Pneumonia, bacterial    Prostate cancer ([place])    History reviewed. No pertinent surgical history.  Social History  reports that he has been smoking cigarettes. He has never used smokeless tobacco. He reports that he does not drink alcohol and does not use drugs.  Allergies  Allergen Reactions   Elavil [Amitriptyline]    Tetracyclines & Related    Family History  Problem Relation Age of Onset   Hypertension Mother    Skin cancer Mother    Osteoporosis Mother    Hypertension Father    Prior to Admission medications   Medication Sig Start Date End Date Taking? Authorizing Provider  acetaminophen (TYLENOL) 650 MG CR tablet Take 650 mg by mouth every 8 (eight) hours as needed for pain.   Yes [provider]  albuterol (PROVENTIL) (2.5 MG/3ML) 0.083% nebulizer solution Take 3 mLs by nebulization 3 (three) times daily as needed for wheezing or shortness of breath. 09/12/21  Yes [provider]  amLODipine (NORVASC) 5 MG tablet Take 5 mg by mouth daily.   Yes [provider]  aspirin 81 MG chewable tablet Chew by mouth daily.   Yes [provider]  Cholecalciferol (VITAMIN D3) 25 MCG (1000 UT) CAPS Take 1,000 Units by mouth daily.   Yes [provider]  Cyanocobalamin (B-12 PO) Take 1 tablet by mouth daily.  Yes [provider]  dextromethorphan (DELSYM) 30 MG/5ML liquid Take 30 mg by mouth 2 (two) times daily as needed for cough.   Yes [provider]  gabapentin (NEURONTIN) 300 MG capsule Take 1 tablet (300 mg) in the morning and 2 tablets (600 mg) before bed. Patient taking differently: Take 300-600 mg by mouth See admin instructions. Take 300 mg in the morning and afternoon and 600 mg before bed. 08/01/21  Yes Raspet, Erin K, PA-C   guaiFENesin (MUCINEX) 600 MG 12 hr tablet Take 600 mg by mouth 2 (two) times daily as needed for cough.   Yes [provider]  indomethacin (INDOCIN SR) 75 MG CR capsule Take 75 mg by mouth 2 (two) times daily with a meal.   Yes [provider]  ketotifen (ZADITOR) 0.025 % ophthalmic solution Place 1 drop into both eyes 2 (two) times daily as needed (allergies).   Yes [provider]  losartan (COZAAR) 50 MG tablet Take 50 mg by mouth daily.   Yes [provider]  omeprazole (PRILOSEC) 20 MG capsule Take 20 mg by mouth daily.   Yes [provider]  Pyridoxine HCl (B-6 PO) Take 1 tablet by mouth daily.   Yes [provider]  simvastatin (ZOCOR) 40 MG tablet Take 40 mg by mouth daily.   Yes [provider]  Grant Ruts INHUB 250-50 MCG/ACT AEPB Inhale 1 puff into the lungs 2 (two) times daily. 10/09/21  Yes [provider]  tizanidine (ZANAFLEX) 2 MG capsule Take 1 capsule (2 mg total) by mouth at bedtime as needed for muscle spasms. Patient not taking: Reported on 10/24/2021 08/01/21   Terrilee Croak, PA-C   Physical Exam: Vitals:   10/24/21 1120 10/24/21 1502 10/24/21 1600 10/24/21 1646  BP: 127/86 (!) 154/84 139/89 139/89  Pulse: 93 93 95 86  Resp: (!) 22 (!) 24 (!) 22 (!) 22  Temp:    99.2 F (37.3 C)  TempSrc:    Oral  SpO2: 93% 95% 95% 95%  Weight:      Height:       Constitutional: Acutely ill-appearing.  NAD, calm, comfortable Eyes: PERRL, lids and conjunctivae normal.  Injected sclera. ENMT: Mucous membranes are mildly dry.  Posterior pharynx clear of any exudate or lesions. Neck: normal, supple, no masses, no thyromegaly Respiratory: Mildly tachypneic in the low 20s.  Bilateral rhonchi with mild wheezing, no crackles. No accessory muscle use.  Cardiovascular: Regular rate and rhythm, no murmurs / rubs / gallops. No extremity edema. 2+ pedal pulses. No carotid bruits.  Abdomen: Obese, no distention.  Bowel sounds  positive.  Soft, no tenderness, no masses palpated. No hepatosplenomegaly. Musculoskeletal: no clubbing / cyanosis.  Moderate generalized weakness.  Good ROM, no contractures. Normal muscle tone.  Skin: no acute rashes, lesions, ulcers on limited dermatological examination. Neurologic: CN 2-12 grossly intact. Sensation intact, DTR normal. Strength 5/5 in all 4.  Psychiatric: Normal judgment and insight. Alert and oriented x 3. Normal mood.   Labs on Admission: I have personally reviewed following labs and imaging studies  CBC: Recent Labs  Lab 10/24/21 0052  WBC 4.2  NEUTROABS 2.8  HGB 11.9*  HCT 34.8*  MCV 90.6  PLT 277    Basic Metabolic Panel: Recent Labs  Lab 10/24/21 0052 10/24/21 0336  NA 133*  --   K 3.1*  --   CL 98  --   CO2 23  --   GLUCOSE 148*  --   BUN 18  --  CREATININE 1.03  --   CALCIUM 8.6*  --   MG  --  2.0  PHOS  --  4.0    GFR: Estimated Creatinine Clearance: 86.7 mL/min (by C-G formula based on SCr of 1.03 mg/dL).  Liver Function Tests: Recent Labs  Lab 10/24/21 0052  AST 33  ALT 27  ALKPHOS 64  BILITOT 0.5  PROT 6.7  ALBUMIN 3.5    Urine analysis: No results found for: COLORURINE, APPEARANCEUR, LABSPEC, PHURINE, GLUCOSEU, HGBUR, BILIRUBINUR, KETONESUR, PROTEINUR, UROBILINOGEN, NITRITE, LEUKOCYTESUR  Radiological Exams on Admission: DG Chest Port 1 View  Result Date: 10/24/2021 CLINICAL DATA:  Shortness of breath EXAM: PORTABLE CHEST 1 VIEW COMPARISON:  None. FINDINGS: The heart size and mediastinal contours are within normal limits. Both lungs are clear. The visualized skeletal structures are unremarkable. IMPRESSION: No active disease. Electronically Signed   By: Ulyses Jarred M.D.   On: 10/24/2021 01:32    EKG: Independently reviewed.  Vent. rate 112 BPM PR interval 178 ms QRS duration 99 ms QT/QTcB 335/458 ms P-R-T axes 70 32 34 Sinus tachycardia  Assessment/Plan Principal Problem:   Influenza  A Observation/telemetry. Continue gentle/time-limited IV hydration. Continue supplemental oxygen as needed. Continue DuoNebs every 4 hours as needed. Continue Tamiflu 75 mg p.o. twice daily. Antitussive as needed.  Active Problems:   Hyponatremia Due to poor oral intake. Continue IV fluids.    Hypokalemia Replacing. Follow-up potassium level. Monitoring with supplemented.    Hypertension Continue losartan 50 mg p.o. daily. Continue amlodipine 5 mg p.o. daily. Monitor blood pressure.    Hyperlipidemia Continue simvastatin 40 mg p.o. daily.    Normocytic anemia Monitor hematocrit and hemoglobin.  DVT prophylaxis: Lovenox SQ. Code Status:   Full code. Family Communication:   Disposition Plan:   Patient is from:  Home.  Anticipated DC to:  Home.  Anticipated DC date:  10/26/2021 .  Anticipated DC barriers: Clinical status.  Consults called:   Admission status:  Observation/telemetry.   Severity of Illness: High severity due to acute influenza A causing reactive airways with wheezing, generalized weakness, malaise and fatigue.   Reubin Milan MD [facility]  How to contact the [facility] Attending or Consulting provider Reserve or covering provider during after hours [place], for this patient?   Check the care team in [employer] and look for a) attending/consulting [facility] provider listed and b) the [facility] team listed Log into [email or web address] and use [place]'s universal password to access. If you do not have the password, please contact the hospital operator. Locate the [facility] provider you are looking for under [facility] and page to a number that you can be directly reached. If you still have difficulty reaching the provider, please page the [facility] (Director on Call) for the Hospitalists listed on amion for assistance.  10/24/2021, 5:36 PM   This document was prepared using Dragon voice recognition software and may contain some unintended transcription errors.

## 2021-10-25 DIAGNOSIS — J9601 Acute respiratory failure with hypoxia: Secondary | ICD-10-CM | POA: Diagnosis present

## 2021-10-25 DIAGNOSIS — J45909 Unspecified asthma, uncomplicated: Secondary | ICD-10-CM | POA: Diagnosis present

## 2021-10-25 DIAGNOSIS — J45901 Unspecified asthma with (acute) exacerbation: Secondary | ICD-10-CM | POA: Diagnosis present

## 2021-10-25 LAB — BASIC METABOLIC PANEL
Anion gap: 7 (ref 5–15)
BUN: 20 mg/dL (ref 8–23)
CO2: 26 mmol/L (ref 22–32)
Calcium: 8.5 mg/dL — ABNORMAL LOW (ref 8.9–10.3)
Chloride: 100 mmol/L (ref 98–111)
Creatinine, Ser: 0.77 mg/dL (ref 0.61–1.24)
GFR, Estimated: >60 mL/min (ref >60)
Glucose, Bld: 120 mg/dL — ABNORMAL HIGH (ref 70–99)
Potassium: 4.4 mmol/L (ref 3.5–5.1)
Sodium: 133 mmol/L — ABNORMAL LOW (ref 135–145)

## 2021-10-25 LAB — CBC
HCT: 34.9 % — ABNORMAL LOW (ref 39.0–52.0)
Hemoglobin: 11.5 g/dL — ABNORMAL LOW (ref 13.0–17.0)
MCH: 31 pg (ref 26.0–34.0)
MCHC: 33 g/dL (ref 30.0–36.0)
MCV: 94.1 fL (ref 80.0–100.0)
Platelets: UNDETERMINED 10*3/uL (ref 150–400)
RBC: 3.71 MIL/uL — ABNORMAL LOW (ref 4.22–5.81)
RDW: 14.6 % (ref 11.5–15.5)
WBC: 6.8 10*3/uL (ref 4.0–10.5)
nRBC: 0 % (ref 0.0–0.2)

## 2021-10-25 LAB — HIV ANTIBODY (ROUTINE TESTING W REFLEX): HIV Screen 4th Generation wRfx: NONREACTIVE

## 2021-10-25 MED ORDER — IPRATROPIUM-ALBUTEROL 0.5-2.5 (3) MG/3ML IN SOLN
3.0000 mL | RESPIRATORY_TRACT | Status: DC | PRN
  Administered 2021-10-25 – 2021-10-26 (×2): 3 mL via RESPIRATORY_TRACT
  Filled 2021-10-25 (×3): qty 3

## 2021-10-25 MED ORDER — METHYLPREDNISOLONE SODIUM SUCC 40 MG IJ SOLR
40.0000 mg | Freq: Every day | INTRAMUSCULAR | Status: DC
  Administered 2021-10-25 – 2021-10-26 (×2): 40 mg via INTRAVENOUS
  Filled 2021-10-25: qty 1

## 2021-10-25 MED ORDER — ALBUTEROL SULFATE (2.5 MG/3ML) 0.083% IN NEBU: 2.5000 mg | INHALATION_SOLUTION | Freq: Four times a day (QID) | RESPIRATORY_TRACT | Status: DC

## 2021-10-25 MED ORDER — ALBUTEROL SULFATE (2.5 MG/3ML) 0.083% IN NEBU
2.5000 mg | INHALATION_SOLUTION | Freq: Three times a day (TID) | RESPIRATORY_TRACT | Status: DC
  Administered 2021-10-25 – 2021-10-26 (×5): 2.5 mg via RESPIRATORY_TRACT
  Filled 2021-10-25 (×5): qty 3

## 2021-10-25 MED ORDER — BENZONATATE 100 MG PO CAPS
100.0000 mg | ORAL_CAPSULE | Freq: Three times a day (TID) | ORAL | Status: DC
  Administered 2021-10-25 – 2021-10-28 (×11): 100 mg via ORAL
  Filled 2021-10-25 (×10): qty 1

## 2021-10-25 MED ORDER — DM-GUAIFENESIN ER 30-600 MG PO TB12
1.0000 | ORAL_TABLET | Freq: Two times a day (BID) | ORAL | Status: DC
  Administered 2021-10-25 – 2021-10-28 (×7): 1 via ORAL
  Filled 2021-10-25 (×6): qty 1

## 2021-10-25 MED ORDER — IPRATROPIUM-ALBUTEROL 0.5-2.5 (3) MG/3ML IN SOLN
RESPIRATORY_TRACT | Status: AC
  Administered 2021-10-25: 3 mL
  Filled 2021-10-25: qty 3

## 2021-10-25 NOTE — Assessment & Plan Note (Signed)
Blood pressure stable.  Monitor. 

## 2021-10-25 NOTE — Assessment & Plan Note (Signed)
Currently 83% on room air.  Improved to 91% on 2 L of oxygen at rest. Bilateral expiratory wheezing with tachypnea and respiratory distress with ongoing cough. Continue treatment for influenza.

## 2021-10-25 NOTE — Progress Notes (Signed)
Pt c/o SOB, trouble breathing. Wheezing noted. 2L [place] applied. 91% oxygen saturation. Pt dangle at bed and this RN encouraged deep breathing technique.   Respiratory therapist notified and aware.

## 2021-10-25 NOTE — Progress Notes (Signed)
  Progress Note    [employer].   XFG:[id number]  DOB: 11/21/1951  DOA: 10/24/2021     1 Date of Service: 10/25/2021   Clinical Course Continues to have cough and shortness of breath.  Requires 2 L of oxygen.  Assessment and Plan * Influenza A Presents with cough and shortness of breath.  Found to have influenza A infection.  Currently receiving Tamiflu as the patient had some fever although symptoms actually started 7 days ago therefore not sure whether that will be effective. Continue supportive care  Acute respiratory failure with hypoxia (HCC) Currently 83% on room air.  Improved to 91% on 2 L of oxygen at rest. Bilateral expiratory wheezing with tachypnea and respiratory distress with ongoing cough. Continue treatment for influenza.  Asthma, chronic, unspecified asthma severity, with acute exacerbation Patient has chronic asthma and uses Wixela. Currently appears to have severe flareup secondary to influenza infection. I have ordered albuterol nebulizer therapy on a scheduled basis.  Patient was on as needed DuoNebs. Also added medication for cough suppression.  Monitor response.  Hyponatremia From poor p.o. intake. Now being corrected.  Monitor.  Hypokalemia Potassium replaced.  Monitor.  Hypertension Blood pressure stable.  Monitor.  Hyperlipidemia Continue statin although change from simvastatin to Lipitor due to interaction with Norvasc.  Normocytic anemia Hemoglobin stable.  Outpatient work-up recommended.     Subjective:  Continues to have cough.  Continues to have some chest pain.  No nausea or vomiting.  Objective Vitals:   10/25/21 0852 10/25/21 1003 10/25/21 1413 10/25/21 1638  BP: (!) 142/78  133/78   Pulse: 83  88   Resp:   20   Temp:   98 F (36.7 C)   TempSrc:      SpO2:  94% 95% 94%  Weight:      Height:       113.4 kg  Exam General: Appear in mild distress, no Rash; Oral Mucosa Clear, moist. no Abnormal Neck Mass Or  lumps, Conjunctiva normal  Cardiovascular: S1 and S2 Present, no Murmur, Respiratory: increased respiratory effort, Bilateral Air entry present and bilateral  Crackles, no wheezes Abdomen: Bowel Sound present, Soft and no tenderness Extremities: trace Pedal edema Neurology: alert and oriented to time, place, and person affect appropriate. no new focal deficit Gait not checked due to patient safety concerns   Labs / Other Information Sodium level stable.  Potassium level improving.  Hemoglobin stable.   Disposition Plan: Status is: Inpatient  Remains inpatient appropriate because: Ongoing hypoxia and severe respiratory distress.  Time spent: 35 minutes [facility] 10/25/2021, 6:40 PM

## 2021-10-25 NOTE — Assessment & Plan Note (Signed)
Potassium replaced.  Monitor.

## 2021-10-25 NOTE — Assessment & Plan Note (Signed)
Hemoglobin stable.  Outpatient work-up recommended.

## 2021-10-25 NOTE — Assessment & Plan Note (Signed)
Continue statin although change from simvastatin to Lipitor due to interaction with Norvasc.

## 2021-10-25 NOTE — Assessment & Plan Note (Signed)
Presents with cough and shortness of breath.  Found to have influenza A infection.  Currently receiving Tamiflu as the patient had some fever although symptoms actually started 7 days ago therefore not sure whether that will be effective. Continue supportive care

## 2021-10-25 NOTE — Assessment & Plan Note (Signed)
From poor p.o. intake. Now being corrected.  Monitor.

## 2021-10-25 NOTE — Assessment & Plan Note (Signed)
Patient has chronic asthma and uses Wixela. Currently appears to have severe flareup secondary to influenza infection. I have ordered albuterol nebulizer therapy on a scheduled basis.  Patient was on as needed DuoNebs. Also added medication for cough suppression.  Monitor response.

## 2021-10-26 ENCOUNTER — Inpatient Hospital Stay (HOSPITAL_COMMUNITY): Payer: Medicare Other

## 2021-10-26 LAB — CBC
HCT: 35.3 % — ABNORMAL LOW (ref 39.0–52.0)
Hemoglobin: 11.6 g/dL — ABNORMAL LOW (ref 13.0–17.0)
MCH: 30.4 pg (ref 26.0–34.0)
MCHC: 32.9 g/dL (ref 30.0–36.0)
MCV: 92.4 fL (ref 80.0–100.0)
Platelets: 160 10*3/uL (ref 150–400)
RBC: 3.82 MIL/uL — ABNORMAL LOW (ref 4.22–5.81)
RDW: 14.6 % (ref 11.5–15.5)
WBC: 4.2 10*3/uL (ref 4.0–10.5)
nRBC: 0 % (ref 0.0–0.2)

## 2021-10-26 LAB — BASIC METABOLIC PANEL
Anion gap: 8 (ref 5–15)
BUN: 15 mg/dL (ref 8–23)
CO2: 30 mmol/L (ref 22–32)
Calcium: 8.8 mg/dL — ABNORMAL LOW (ref 8.9–10.3)
Chloride: 98 mmol/L (ref 98–111)
Creatinine, Ser: 0.81 mg/dL (ref 0.61–1.24)
GFR, Estimated: >60 mL/min (ref >60)
Glucose, Bld: 107 mg/dL — ABNORMAL HIGH (ref 70–99)
Potassium: 3.7 mmol/L (ref 3.5–5.1)
Sodium: 136 mmol/L (ref 135–145)

## 2021-10-26 IMAGING — DX DG CHEST 2V
2 series · 2 of 2 positions shown · non-contrast
Comparison: Radiograph [DATE].

CLINICAL DATA: Productive cough and shortness of breath.

EXAM:
CHEST - 2 VIEW

[chest pa]
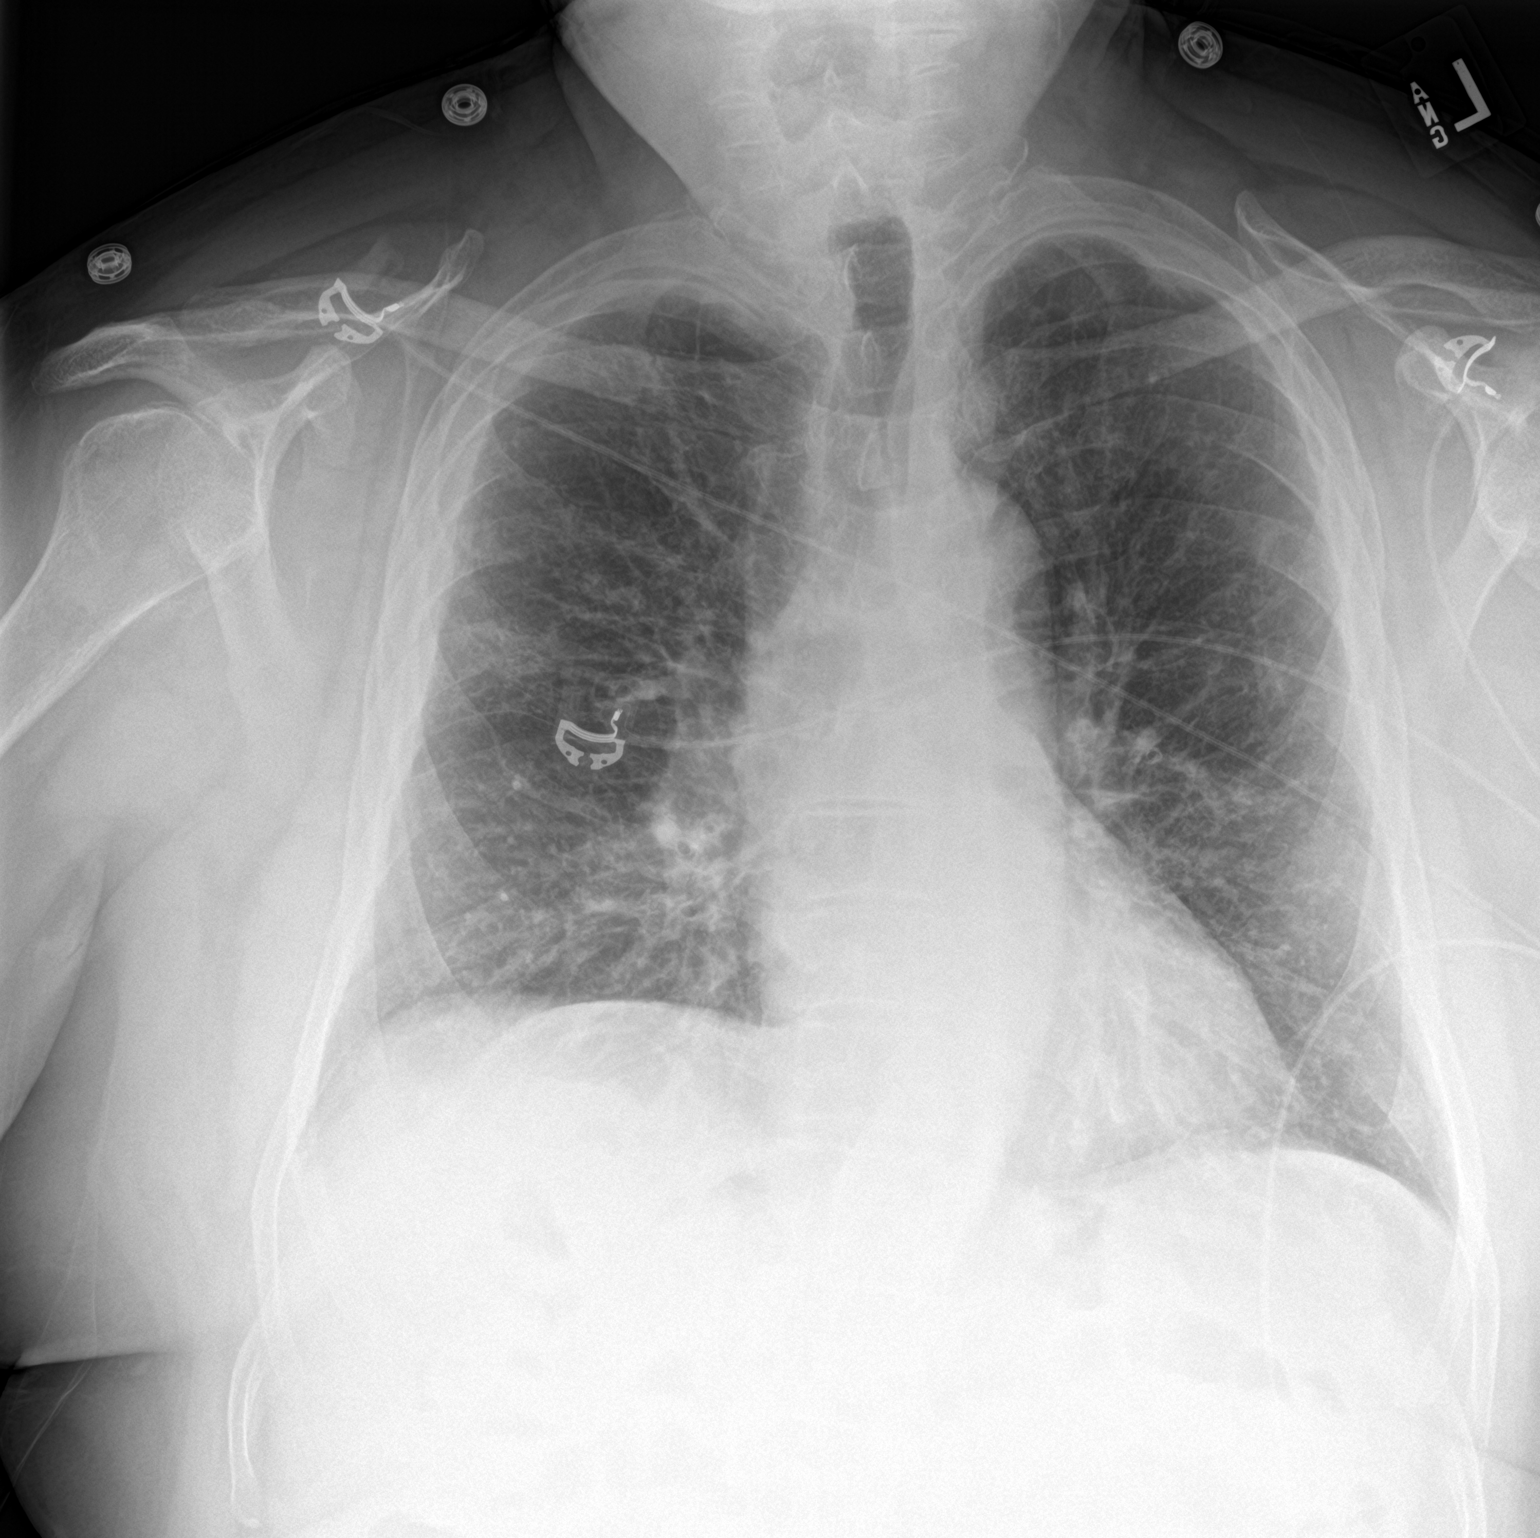

[chest lat]
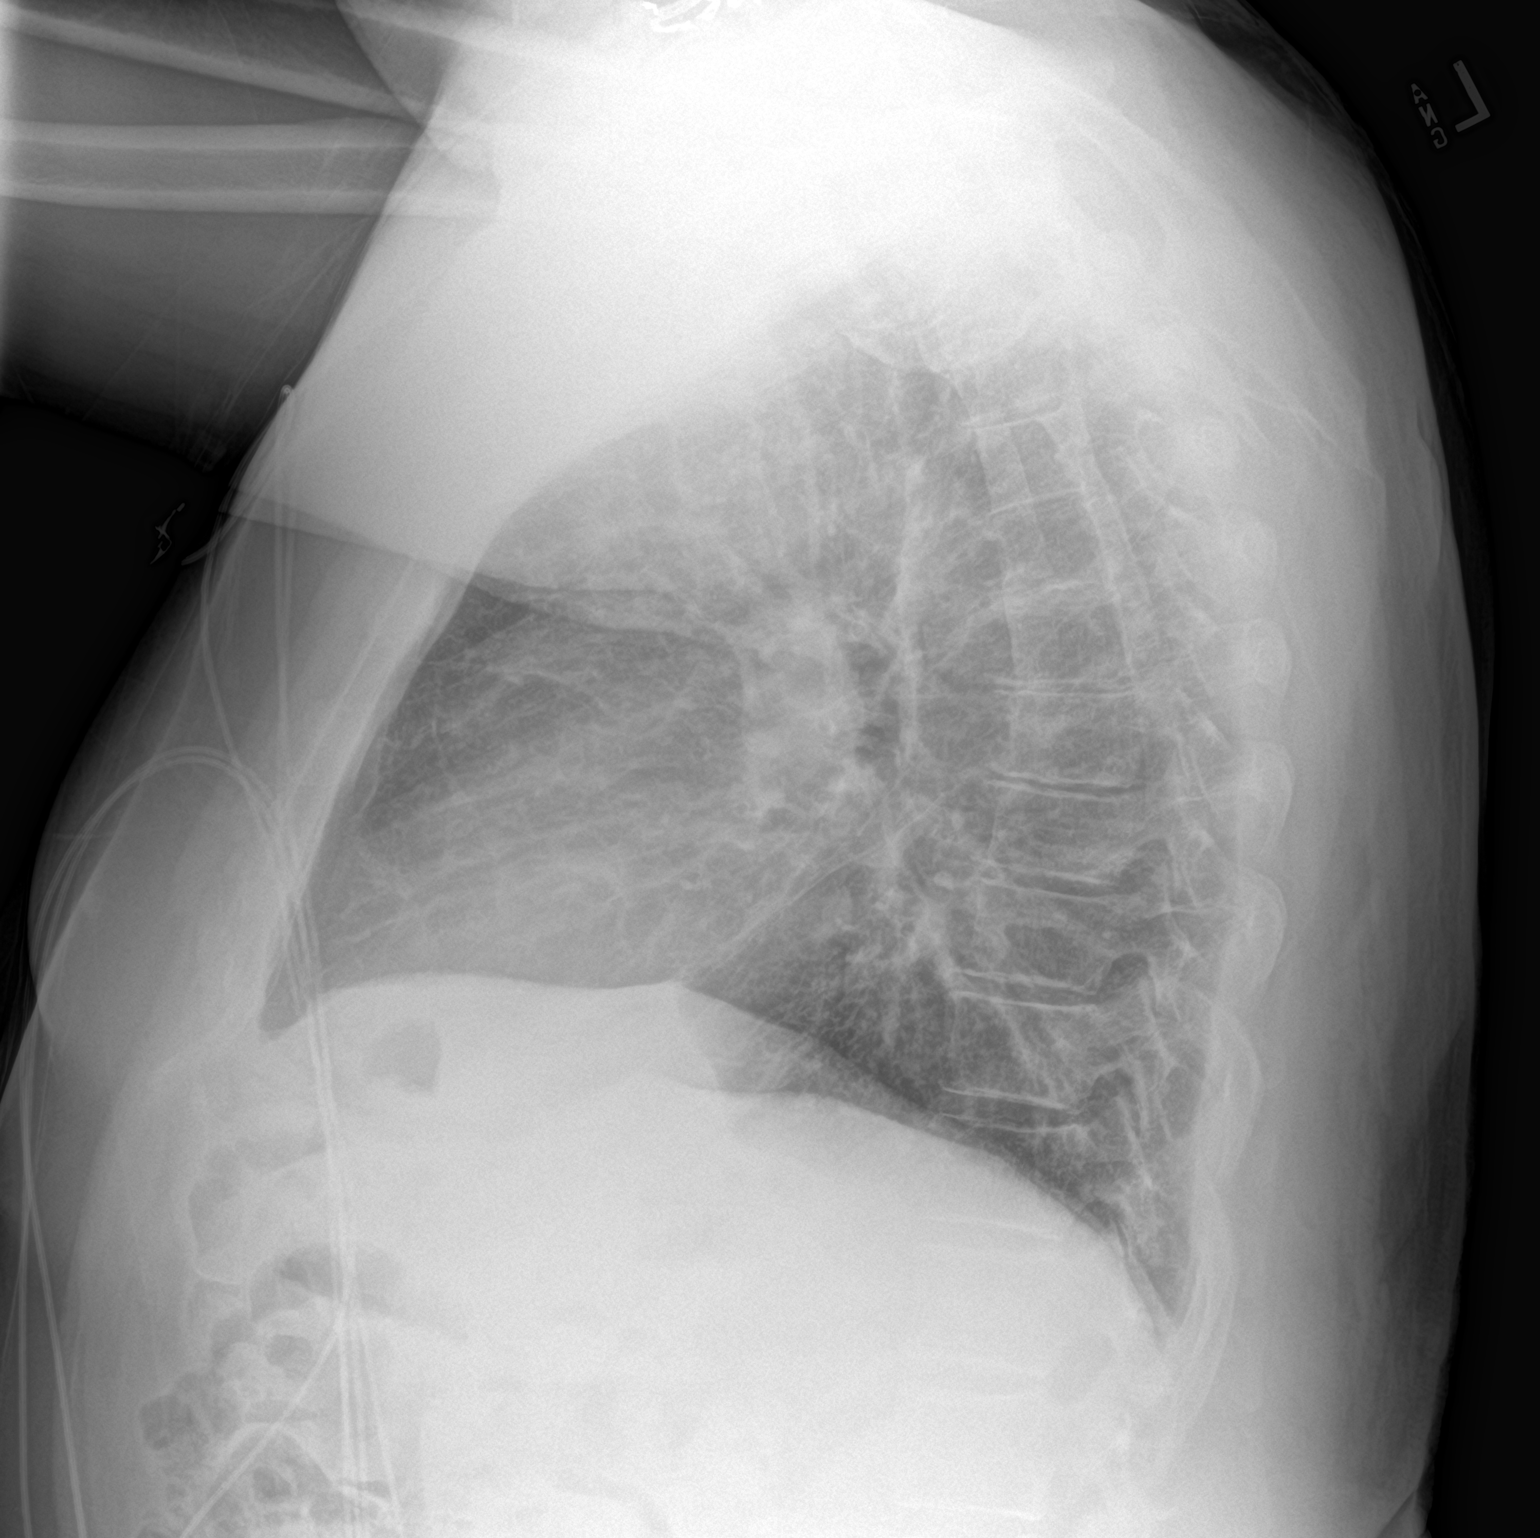

[2 of 2 positions shown; findings below may reference images not displayed]

FINDINGS: There is faint but new patchy bilateral airspace disease within both
lungs, slight peripheral predominant distribution. Mild interstitial
and bronchial thickening. Heart is normal in size. Mild aortic
tortuosity. There may be trace pleural effusions. No pneumothorax.
No acute osseous abnormalities are seen.
IMPRESSION: Faint new patchy bilateral airspace disease, slight peripheral
predominant distribution, suspicious for multifocal pneumonia,
including atypical viral infection.

## 2021-10-26 MED ORDER — ALBUTEROL SULFATE (2.5 MG/3ML) 0.083% IN NEBU
2.5000 mg | INHALATION_SOLUTION | Freq: Three times a day (TID) | RESPIRATORY_TRACT | Status: DC
Start: 2021-10-27 — End: 2021-10-27
  Administered 2021-10-27: 2.5 mg via RESPIRATORY_TRACT
  Filled 2021-10-26: qty 3

## 2021-10-26 NOTE — Assessment & Plan Note (Signed)
Blood pressure stable.  Monitor. 

## 2021-10-26 NOTE — Assessment & Plan Note (Signed)
From poor p.o. intake. Now being corrected.  Monitor.

## 2021-10-26 NOTE — Evaluation (Signed)
Physical Therapy Evaluation Patient Details Name: Charles Weiss. MRN: [id number] DOB: January 18, 1951 Today's Date: 10/26/2021  History of Present Illness  Charles Weiss. is a 70 y.o. male admitted with flu A. PMH: asthma, GERD, prostate cancer, hyperlipidemia, hypertension, COVID-19 pneumonia, history of bacterial pneumonia with right lung collapse  Clinical Impression  Pt admitted with above diagnosis. Pt independent at baseline, has rollator that he sometimes uses, no home O2 at baseline, admits to falls but able to get up by pulling on furniture. Pt currently shaky with transfers and ambulation without LOB, requiring 2L O2 to maintain O2>90%. Pt desats to 86% on RA with ambulation. Pt educated on pursed lip breathing with mobility with good carryover. Pt currently lives in 2nd floor bedroom on split level, hoping to move to downstairs room but unsure of timeline. Pt reports roommates or landlord can assist with grocery shopping if needed while recovering. Pt currently with functional limitations due to the deficits listed below (see PT Problem List). Pt will benefit from skilled PT to increase their independence and safety with mobility to allow discharge to the venue listed below.          Recommendations for follow up therapy are one component of a multi-disciplinary discharge planning process, led by the attending physician.  Recommendations may be updated based on patient status, additional functional criteria and insurance authorization.  Follow Up Recommendations Home health PT    Assistance Recommended at Discharge Intermittent Supervision/Assistance  Functional Status Assessment    Equipment Recommendations  None recommended by PT    Recommendations for Other Services       Precautions / Restrictions Precautions Precautions: Fall Precaution Comments: monitor O2 Restrictions Weight Bearing Restrictions: No      Mobility  Bed Mobility  General bed  mobility comments: sitting EOB upon arrival    Transfers Overall transfer level: Needs assistance Equipment used: Rolling walker (2 wheels) Transfers: Sit to/from Stand Sit to Stand: Min guard  General transfer comment: rocking momentum to power to stand from EOB, slightly shaky but no LOB    Ambulation/Gait Ambulation/Gait assistance: Supervision Gait Distance (Feet): 20 Feet Assistive device: Rolling walker (2 wheels) Gait Pattern/deviations: Step-through pattern;Decreased stride length Gait velocity: decreased   General Gait Details: step through pattern with RW, slightly shaky without unsteadiness or LOB, on RA with SpO2 86%, VC for pursed lip breathing while ambulating, limited by fatigue  Stairs            Wheelchair Mobility    Modified Rankin (Stroke Patients Only)       Balance Overall balance assessment: Needs assistance;History of Falls Sitting-balance support: No upper extremity supported Sitting balance-Leahy Scale: Good Sitting balance - Comments: seated EOB   Standing balance support: No upper extremity supported;During functional activity;Reliant on assistive device for balance Standing balance-Leahy Scale: Poor        Pertinent Vitals/Pain Pain Assessment: No/denies pain ("when I cough")    Home Living Family/patient expects to be discharged to:: Private residence Living Arrangements: Other (Comment) (roomate) Available Help at Discharge: Family Type of Home: House Home Access: Stairs to enter Entrance Stairs-Rails: None Entrance Stairs-Number of Steps: 5-6 Alternate Level Stairs-Number of Steps: 13 Home Layout: Multi-level Home Equipment: Rollator (4 wheels);Grab bars - tub/shower Additional Comments: pt hoping to move to downstairs room when another roommate moves out, unsure of timing    Prior Function Prior Level of Function : Independent/Modified Independent  Mobility Comments: pt reports independent with community ambulation and  transfers,  sometimes uses rollator and sometimes doesn't. ADLs Comments: pt reports ind with ADLs/IADLs     Hand Dominance        Extremity/Trunk Assessment   Upper Extremity Assessment Upper Extremity Assessment: Overall WFL for tasks assessed    Lower Extremity Assessment Lower Extremity Assessment: Overall WFL for tasks assessed (AROM WNL, strength 4/5, reports numbness to bottom of bil feet from neuropathy)    Cervical / Trunk Assessment Cervical / Trunk Assessment: Normal  Communication   Communication: No difficulties  Cognition Arousal/Alertness: Awake/alert Behavior During Therapy: WFL for tasks assessed/performed Overall Cognitive Status: Within Functional Limits for tasks assessed     General Comments General comments (skin integrity, edema, etc.): SpO2 86% on RA while ambulating, improves to 93% on 2L O2- RN notified    Exercises     Assessment/Plan    PT Assessment Patient needs continued PT services  PT Problem List Decreased strength;Decreased activity tolerance;Decreased balance;Cardiopulmonary status limiting activity       PT Treatment Interventions DME instruction;Gait training;Stair training;Functional mobility training;Therapeutic activities;Therapeutic exercise;Balance training;Patient/family education    PT Goals (Current goals can be found in the Care Plan section)  Acute Rehab PT Goals Patient Stated Goal: return home, move to bedroom downstairs PT Goal Formulation: With patient Time For Goal Achievement: 11/09/21 Potential to Achieve Goals: Good    Frequency Min 3X/week   Barriers to discharge        Co-evaluation               AM-PAC PT "6 Clicks" Mobility  Outcome Measure Help needed turning from your back to your side while in a flat bed without using bedrails?: A Little Help needed moving from lying on your back to sitting on the side of a flat bed without using bedrails?: A Little Help needed moving to and from a bed to a  chair (including a wheelchair)?: A Little Help needed standing up from a chair using your arms (e.g., wheelchair or bedside chair)?: A Little Help needed to walk in hospital room?: A Little Help needed climbing 3-5 steps with a railing? : A Little 6 Click Score: 18    End of Session Equipment Utilized During Treatment: Oxygen Activity Tolerance: Patient tolerated treatment well;Patient limited by fatigue Patient left: in bed;with call bell/phone within reach (sitting EOB) Nurse Communication: Mobility status;Other (comment) (O2) PT Visit Diagnosis: Unsteadiness on feet (R26.81);Other abnormalities of gait and mobility (R26.89)    Time: 0277-4128 PT Time Calculation (min) (ACUTE ONLY): 21 min   Charges:   PT Evaluation $PT Eval Low Complexity: 1 Low           Tori Ahmed Inniss PT, DPT 10/26/21, 1:16 PM

## 2021-10-26 NOTE — Assessment & Plan Note (Signed)
Was 83% on room air.  Improved to 91% on 2 L of oxygen at rest.  Currently 89% Bilateral expiratory wheezing with tachypnea and respiratory distress with ongoing cough. Continue treatment for influenza.

## 2021-10-26 NOTE — Assessment & Plan Note (Signed)
Continue statin, although change from simvastatin to Lipitor due to interaction with Norvasc.

## 2021-10-26 NOTE — Assessment & Plan Note (Signed)
Potassium replaced.  Monitor.

## 2021-10-26 NOTE — Progress Notes (Signed)
  Progress Note    [employer].   [id number]  DOB: 05/10/1951  DOA: 10/24/2021     2 Date of Service: 10/26/2021   Clinical Course Presents with influenza a and asthma flareup.  Severely hypoxic on admission.  Assessment and Plan * Influenza A Presents with cough and shortness of breath. Found to have influenza A infection.   Currently receiving Tamiflu as the patient had some fever although symptoms actually started 7 days ago therefore not sure whether that will be effective.  I will discontinue it. Continue supportive care  Acute respiratory failure with hypoxia (HCC) Was 83% on room air.  Improved to 91% on 2 L of oxygen at rest.  Currently 89% Bilateral expiratory wheezing with tachypnea and respiratory distress with ongoing cough. Continue treatment for influenza.  Asthma, chronic, unspecified asthma severity, with acute exacerbation Patient has chronic asthma and uses Wixela. Currently appears to have severe flareup secondary to influenza infection. I have ordered albuterol nebulizer therapy on a scheduled basis.  Still having severe wheezing. Patient was on as needed DuoNebs. Also added medication for cough suppression.  Monitor response.  Hyponatremia From poor p.o. intake. Now being corrected.  Monitor.  Hypokalemia Potassium replaced.  Monitor.  Hypertension Blood pressure stable.  Monitor.  Hyperlipidemia Continue statin, although change from simvastatin to Lipitor due to interaction with Norvasc.  Normocytic anemia Hemoglobin stable.  Outpatient work-up recommended.   Subjective:  Continues to have cough and shortness of breath.  Also reports pleuritic chest pain.  No nausea no vomiting.  Objective Vitals:   10/25/21 2003 10/25/21 2031 10/26/21 0646 10/26/21 0807  BP:  (!) 120/59 126/74   Pulse:  86 81   Resp:  18 20   Temp:  98.3 F (36.8 C) 98.2 F (36.8 C)   TempSrc:  Oral Oral   SpO2: 94% 96% 93% 96%  Weight:      Height:        113.4 kg  Exam General: Appear in mild distress, no Rash; Oral Mucosa Clear, moist. no Abnormal Neck Mass Or lumps, Conjunctiva normal  Cardiovascular: S1 and S2 Present, no Murmur, Respiratory: increased respiratory effort, Bilateral Air entry present and bilateral  Crackles, no wheezes Abdomen: Bowel Sound present, Soft and no tenderness Extremities: trace Pedal edema Neurology: alert and oriented to time, place, and person affect appropriate. no new focal deficit Gait not checked due to patient safety concerns   Labs / Other Information Electrolytes stable.   Disposition Plan: Status is: Inpatient  Remains inpatient appropriate because: Still 88% on room air.  No respiratory stress.  Needing nebulizer therapy and IV steroids  Time spent: 35 minutes [facility] 10/26/2021, 12:26 PM

## 2021-10-26 NOTE — Assessment & Plan Note (Signed)
Hemoglobin stable.  Outpatient work-up recommended.

## 2021-10-26 NOTE — Assessment & Plan Note (Signed)
Presents with cough and shortness of breath. Found to have influenza A infection.   Currently receiving Tamiflu as the patient had some fever although symptoms actually started 7 days ago therefore not sure whether that will be effective.  I will discontinue it. Continue supportive care

## 2021-10-26 NOTE — Assessment & Plan Note (Signed)
Patient has chronic asthma and uses Wixela. Currently appears to have severe flareup secondary to influenza infection. I have ordered albuterol nebulizer therapy on a scheduled basis.  Still having severe wheezing. Patient was on as needed DuoNebs. Also added medication for cough suppression.  Monitor response.

## 2021-10-27 MED ORDER — ALBUTEROL SULFATE (2.5 MG/3ML) 0.083% IN NEBU
2.5000 mg | INHALATION_SOLUTION | Freq: Four times a day (QID) | RESPIRATORY_TRACT | Status: DC
Start: 2021-10-27 — End: 2021-10-28
  Administered 2021-10-27 – 2021-10-28 (×3): 2.5 mg via RESPIRATORY_TRACT
  Filled 2021-10-27 (×3): qty 3

## 2021-10-27 MED ORDER — METHYLPREDNISOLONE SODIUM SUCC 40 MG IJ SOLR
40.0000 mg | Freq: Two times a day (BID) | INTRAMUSCULAR | Status: DC
Start: 2021-10-27 — End: 2021-10-29
  Administered 2021-10-27 – 2021-10-28 (×3): 40 mg via INTRAVENOUS
  Filled 2021-10-27 (×3): qty 1

## 2021-10-27 MED ORDER — POLYETHYLENE GLYCOL 3350 17 G PO PACK
17.0000 g | PACK | Freq: Every day | ORAL | Status: DC
  Administered 2021-10-27: 17 g via ORAL
  Filled 2021-10-27 (×2): qty 1

## 2021-10-27 MED ORDER — SENNOSIDES-DOCUSATE SODIUM 8.6-50 MG PO TABS
1.0000 | ORAL_TABLET | Freq: Two times a day (BID) | ORAL | Status: DC
  Administered 2021-10-27 – 2021-10-28 (×3): 1 via ORAL
  Filled 2021-10-27 (×3): qty 1

## 2021-10-27 NOTE — Assessment & Plan Note (Signed)
Patient has chronic asthma and uses Wixela. Currently appears to have severe flareup secondary to influenza infection. I have ordered albuterol nebulizer therapy on a scheduled basis.  Still having severe wheezing. Patient was on as needed DuoNebs. Also added medication for cough suppression.  Monitor response.

## 2021-10-27 NOTE — Assessment & Plan Note (Signed)
Was 83% on room air.  Improved to 91% on 2 L of oxygen at rest.  Currently 89% on room air. Bilateral expiratory wheezing with tachypnea and respiratory distress with ongoing cough. Continue treatment for influenza.

## 2021-10-27 NOTE — Assessment & Plan Note (Signed)
Potassium replaced.  Monitor.

## 2021-10-27 NOTE — Assessment & Plan Note (Signed)
Presents with cough and shortness of breath. Found to have influenza A infection.   Currently receiving Tamiflu as the patient had some fever although symptoms actually started 7 days ago therefore not sure whether that will be effective.  I will discontinue it. Continue supportive care

## 2021-10-27 NOTE — Progress Notes (Signed)
  Progress Note    [employer].   [id number]  DOB: 07-26-1951  DOA: 10/24/2021     3 Date of Service: 10/27/2021   Assessment and Plan * Influenza A Presents with cough and shortness of breath. Found to have influenza A infection.   Currently receiving Tamiflu as the patient had some fever although symptoms actually started 7 days ago therefore not sure whether that will be effective.  I will discontinue it. Continue supportive care  Acute respiratory failure with hypoxia (HCC) Was 83% on room air.  Improved to 91% on 2 L of oxygen at rest.  Currently 89% on room air. Bilateral expiratory wheezing with tachypnea and respiratory distress with ongoing cough. Continue treatment for influenza.  Asthma, chronic, unspecified asthma severity, with acute exacerbation Patient has chronic asthma and uses Wixela. Currently appears to have severe flareup secondary to influenza infection. I have ordered albuterol nebulizer therapy on a scheduled basis.  Still having severe wheezing. Patient was on as needed DuoNebs. Also added medication for cough suppression.  Monitor response.  Hypokalemia Potassium replaced.  Monitor.   Constipation. Aggressively treating.   Subjective:  Continues to have cough and shortness of breath.  Feeling weak and tired.  No nausea or vomiting.  Objective Vitals:   10/27/21 0835 10/27/21 1422 10/27/21 1941 10/27/21 2034  BP:  (!) 154/93  133/89  Pulse:  87  83  Resp:  (!) 21  20  Temp:  (!) 97.5 F (36.4 C)  98.2 F (36.8 C)  TempSrc:  Oral  Oral  SpO2: (!) 89% 92% 94% 92%  Weight:      Height:       113.4 kg  Exam General: Appear in mild distress, no Rash; Oral Mucosa Clear, moist. no Abnormal Neck Mass Or lumps, Conjunctiva normal  Cardiovascular: S1 and S2 Present, no Murmur, Respiratory: increased respiratory effort, Bilateral Air entry present and bilateral  Crackles, no wheezes Abdomen: Bowel Sound present, Soft and no  tenderness Extremities: trace Pedal edema Neurology: alert and oriented to time, place, and person affect appropriate. no new focal deficit Gait not checked due to patient safety concerns   Labs / Other Information    Disposition Plan: Status is: Inpatient  Remains inpatient appropriate because: Continues to have bilateral expiratory wheezing.  Increasing nebulizer therapy.  Time spent: 35 minutes [facility] 10/27/2021, 9:46 PM

## 2021-10-27 NOTE — Care Management Important Message (Signed)
Important Message  Patient Details IM Letter given to the Patient. Name: [facility] [name]. MRN: [id number] Date of Birth: 1951-02-22   Medicare Important Message Given:  Yes     Kerin Salen 10/27/2021, 12:11 PM

## 2021-10-28 MED ORDER — ALBUTEROL SULFATE (2.5 MG/3ML) 0.083% IN NEBU
2.5000 mg | INHALATION_SOLUTION | Freq: Three times a day (TID) | RESPIRATORY_TRACT | Status: DC
Start: 2021-10-28 — End: 2021-10-29
  Administered 2021-10-28: 2.5 mg via RESPIRATORY_TRACT
  Filled 2021-10-28: qty 3

## 2021-10-28 MED ORDER — BENZONATATE 100 MG PO CAPS: 100.0000 mg | ORAL_CAPSULE | Freq: Three times a day (TID) | ORAL | 0 refills | Status: DC

## 2021-10-28 MED ORDER — HYDROCOD POLST-CPM POLST ER 10-8 MG/5ML PO SUER: 5.0000 mL | Freq: Two times a day (BID) | ORAL | 0 refills | Status: DC | PRN

## 2021-10-28 MED ORDER — ALBUTEROL SULFATE (2.5 MG/3ML) 0.083% IN NEBU: 3.0000 mL | INHALATION_SOLUTION | Freq: Three times a day (TID) | RESPIRATORY_TRACT | 0 refills | Status: AC

## 2021-10-28 MED ORDER — DOCUSATE SODIUM 100 MG PO CAPS: 100.0000 mg | ORAL_CAPSULE | Freq: Two times a day (BID) | ORAL | 0 refills | Status: AC

## 2021-10-28 MED ORDER — DM-GUAIFENESIN ER 30-600 MG PO TB12: 1.0000 | ORAL_TABLET | Freq: Two times a day (BID) | ORAL | 0 refills | Status: DC

## 2021-10-28 MED ORDER — PREDNISONE 10 MG PO TABS: ORAL_TABLET | ORAL | 0 refills | Status: DC

## 2021-10-28 MED ORDER — POLYETHYLENE GLYCOL 3350 17 G PO PACK: 17.0000 g | PACK | Freq: Every day | ORAL | 0 refills | Status: DC

## 2021-10-28 MED ORDER — DEXTROMETHORPHAN POLISTIREX ER 30 MG/5ML PO SUER: 30.0000 mg | Freq: Two times a day (BID) | ORAL | 0 refills | Status: DC | PRN

## 2021-10-28 NOTE — Progress Notes (Signed)
Pt ia lert and oriented x 4 with no signs of distress and verbalized his readiness for discharge. He was escorted with RN and NT to the lobby where Melburn Popper is waiting.

## 2021-10-28 NOTE — Progress Notes (Signed)
SATURATION QUALIFICATIONS: (This note is used to comply with regulatory documentation for home oxygen)  Patient Saturations on Room Air at Rest = 95%  Patient Saturations on Room Air while Ambulating = 85%  Patient Saturations on 2 Liters of oxygen while Ambulating = 94%  Please briefly explain why patient needs home oxygen: Patient oxygen saturations drop while ambulating on room air.

## 2021-10-28 NOTE — Progress Notes (Signed)
Patient discharging home, home delivered at bedside. PIV removed. VSS. AVS printed and educational teaching completed with teach back method with Sonne RN. Patient awaiting ride.

## 2021-10-28 NOTE — TOC Transition Note (Signed)
Transition of Care [facility]) - CM/SW Discharge Note   Patient Details  Name: Charles Weiss. MRN: [id number] Date of Birth: Aug 11, 1951  Transition of Care [facility]) CM/SW Contact:  Trish Mage, LCSW Phone Number: 10/28/2021, 12:53 PM   Clinical Narrative:   Patient who is stable for d/c today is recommended for [facility] PT, home O2.  Orders seen and appreciated. Mr Morillo is appreciative of any help he can get, resides here in [place] at a half way house where he has been since May.  Cindie with [employer] agrees to provide [facility] services, [name] with [employer] will arrange for delivery of home unit, travel cannister. No further needs identified.  TOC sign off.    Final next level of care: [place] Barriers to Discharge: No Barriers Identified   Patient Goals and CMS Choice        Discharge Placement                       Discharge Plan and Services                                     Social Determinants of Health (SDOH) Interventions     Readmission Risk Interventions No flowsheet data found.

## 2021-10-29 NOTE — Assessment & Plan Note (Signed)
Hemoglobin stable.  Outpatient work-up recommended.

## 2021-10-29 NOTE — Assessment & Plan Note (Signed)
From poor p.o. intake. Now being corrected.  Monitor.

## 2021-10-29 NOTE — Assessment & Plan Note (Signed)
Patient has chronic asthma and uses Wixela. Currently appears to have severe flareup secondary to influenza infection. I have ordered albuterol nebulizer therapy on a scheduled basis.  Still having severe wheezing. Patient was on as needed DuoNebs. Also added medication for cough suppression.  Monitor response.

## 2021-10-29 NOTE — Assessment & Plan Note (Signed)
Presents with cough and shortness of breath. Found to have influenza A infection.   Was receiving Tamiflu as the patient had some fever although symptoms actually started 7 days ago therefore not sure whether that will be effective.  I will discontinue it. Continue supportive care

## 2021-10-29 NOTE — Assessment & Plan Note (Signed)
Was 83% on room air.  Improved to 91% on 2 L of oxygen at rest.  Bilateral expiratory wheezing with tachypnea and respiratory distress with ongoing cough. improving Continue supportive care.

## 2021-10-29 NOTE — Assessment & Plan Note (Signed)
Continue statin, discuss with PCP regarding interaction.

## 2021-10-29 NOTE — Discharge Summary (Signed)
Physician Discharge Summary   Patient name: Charles Weiss.  Admit date:     10/24/2021  Discharge date: 10/28/2021   Discharge Physician: Berle Mull   PCP: System, Provider Not In   Recommendations at discharge: follow up in 1 week  Discharge Diagnoses Principal Problem:   Influenza A Active Problems:   Acute respiratory failure with hypoxia (HCC)   Asthma, chronic, unspecified asthma severity, with acute exacerbation   Hypokalemia   Hyponatremia   Hypertension   Hyperlipidemia   Normocytic anemia   Resolved Diagnoses Resolved Problems:   * No resolved hospital problems. [facility] Course   No notes on file   * Influenza A Presents with cough and shortness of breath. Found to have influenza A infection.   Was receiving Tamiflu as the patient had some fever although symptoms actually started 7 days ago therefore not sure whether that will be effective.  I will discontinue it. Continue supportive care  Acute respiratory failure with hypoxia (HCC) Was 83% on room air.  Improved to 91% on 2 L of oxygen at rest.  Bilateral expiratory wheezing with tachypnea and respiratory distress with ongoing cough. improving Continue supportive care.   Asthma, chronic, unspecified asthma severity, with acute exacerbation Patient has chronic asthma and uses Wixela. Currently appears to have severe flareup secondary to influenza infection. I have ordered albuterol nebulizer therapy on a scheduled basis.  Still having severe wheezing. Patient was on as needed DuoNebs. Also added medication for cough suppression.  Monitor response.  Hyponatremia From poor p.o. intake. Now being corrected.  Monitor.  Hypokalemia Potassium replaced.  Monitor.  Hypertension Blood pressure stable.  Monitor.  Hyperlipidemia Continue statin, discuss with PCP regarding interaction.   Normocytic anemia Hemoglobin stable.  Outpatient work-up recommended.     Procedures performed:  none   Condition at discharge: good  Exam General: Appear in mild distress, no Rash; Oral Mucosa Clear, moist. no Abnormal Neck Mass Or lumps, Conjunctiva normal  Cardiovascular: S1 and S2 Present, no Murmur, Respiratory: increased respiratory effort, Bilateral Air entry present and no   Crackles, bilateral  wheezes Abdomen: Bowel Sound present, Soft and no tenderness Extremities: trace Pedal edema Neurology: alert and oriented to time, place, and person affect appropriate. no new focal deficit Gait not checked due to patient safety concerns     Disposition: Home  Discharge time: greater than 30 minutes.  Follow-up Information     [facility]. Schedule an appointment as soon as possible for a visit in 1 week(s).   Contact information: [place] [address] [phone] FNP        [facility], [facility] Follow up.   Specialty: [place] Why: They will call you to set up a time to come work with you Contact information: [place] [address] [phone]         Inc[facility] [place] Follow up.   Why: Your home oxygen company Contact information: [address] [place] [address] [phone]                 Allergies as of 10/28/2021       Reactions   Elavil [amitriptyline]    Tetracyclines & Related         Medication List     STOP taking these medications    guaiFENesin 600 MG 12 hr tablet Commonly known as: MUCINEX  indomethacin 75 MG CR capsule Commonly known as: INDOCIN SR       TAKE these medications    acetaminophen 650 MG CR tablet Commonly known as: TYLENOL Take 650 mg by mouth every 8 (eight) hours as needed for pain.   albuterol (2.5 MG/3ML) 0.083% nebulizer solution Commonly known as: PROVENTIL Take 3 mLs by nebulization 3 (three) times daily. What changed:  when to take this reasons to take this   amLODipine  5 MG tablet Commonly known as: NORVASC Take 5 mg by mouth daily.   aspirin 81 MG chewable tablet Chew by mouth daily.   B-12 PO Take 1 tablet by mouth daily.   B-6 PO Take 1 tablet by mouth daily.   benzonatate 100 MG capsule Commonly known as: TESSALON Take 1 capsule (100 mg total) by mouth 3 (three) times daily.   chlorpheniramine-HYDROcodone 10-8 MG/5ML Suer Commonly known as: TUSSIONEX Take 5 mLs by mouth every 12 (twelve) hours as needed for cough.   dextromethorphan 30 MG/5ML liquid Commonly known as: DELSYM Take 5 mLs (30 mg total) by mouth 2 (two) times daily as needed for cough.   dextromethorphan-guaiFENesin 30-600 MG 12hr tablet Commonly known as: MUCINEX DM Take 1 tablet by mouth 2 (two) times daily.   docusate sodium 100 MG capsule Commonly known as: Colace Take 1 capsule (100 mg total) by mouth 2 (two) times daily.   gabapentin 300 MG capsule Commonly known as: NEURONTIN Take 1 tablet (300 mg) in the morning and 2 tablets (600 mg) before bed. What changed:  how much to take how to take this when to take this additional instructions   ketotifen 0.025 % ophthalmic solution Commonly known as: ZADITOR Place 1 drop into both eyes 2 (two) times daily as needed (allergies).   losartan 50 MG tablet Commonly known as: COZAAR Take 50 mg by mouth daily.   omeprazole 20 MG capsule Commonly known as: PRILOSEC Take 20 mg by mouth daily.   polyethylene glycol 17 g packet Commonly known as: MIRALAX / GLYCOLAX Take 17 g by mouth daily.   predniSONE 10 MG tablet Commonly known as: DELTASONE Take 50mg  daily for 3days,Take 40mg  daily for 3days,Take 30mg  daily for 3days,Take 20mg  daily for 3days,Take 10mg  daily for 3days, then stop   simvastatin 40 MG tablet Commonly known as: ZOCOR Take 40 mg by mouth daily.   tizanidine 2 MG capsule Commonly known as: Zanaflex Take 1 capsule (2 mg total) by mouth at bedtime as needed for muscle spasms.   Vitamin D3 25  MCG (1000 UT) Caps Take 1,000 Units by mouth daily.   Wixela Inhub 250-50 MCG/ACT Aepb Generic drug: fluticasone-salmeterol Inhale 1 puff into the lungs 2 (two) times daily.        DG Chest 2 View  Result Date: 10/26/2021 CLINICAL DATA:  Productive cough and shortness of breath. EXAM: CHEST - 2 VIEW COMPARISON:  Radiograph 10/24/2021. FINDINGS: There is faint but new patchy bilateral airspace disease within both lungs, slight peripheral predominant distribution. Mild interstitial and bronchial thickening. Heart is normal in size. Mild aortic tortuosity. There may be trace pleural effusions. No pneumothorax. No acute osseous abnormalities are seen. IMPRESSION: Faint new patchy bilateral airspace disease, slight peripheral predominant distribution, suspicious for multifocal pneumonia, including atypical viral infection. Electronically Signed   By: Keith Rake M.D.   On: 10/26/2021 16:56   DG Chest Port 1 View  Result Date: 10/24/2021 CLINICAL DATA:  Shortness of breath EXAM: PORTABLE CHEST 1 VIEW COMPARISON:  None. FINDINGS: The heart size and mediastinal contours are within normal limits. Both lungs are clear. The visualized skeletal structures are unremarkable. IMPRESSION: No active disease. Electronically Signed   By: Ulyses Jarred M.D.   On: 10/24/2021 01:32   Results for orders placed or performed during the hospital encounter of 10/24/21  Resp Panel by RT-PCR (Flu A&B, Covid) Nasopharyngeal Swab     Status: Abnormal   Collection Time: 10/24/21 12:52 AM   Specimen: Nasopharyngeal Swab; Nasopharyngeal(NP) swabs in vial transport medium  Result Value Ref Range Status   SARS Coronavirus 2 by RT PCR NEGATIVE NEGATIVE Final    Comment: (NOTE) SARS-CoV-2 target nucleic acids are NOT DETECTED.  The SARS-CoV-2 RNA is generally detectable in upper respiratory specimens during the acute phase of infection. The lowest concentration of SARS-CoV-2 viral copies this assay can detect is 138  copies/mL. A negative result does not preclude SARS-Cov-2 infection and should not be used as the sole basis for treatment or other patient management decisions. A negative result may occur with  improper specimen collection/handling, submission of specimen other than nasopharyngeal swab, presence of viral mutation(s) within the areas targeted by this assay, and inadequate number of viral copies(<138 copies/mL). A negative result must be combined with clinical observations, patient history, and epidemiological information. The expected result is Negative.  Fact Sheet for Patients:  EntrepreneurPulse.com.au  Fact Sheet for Healthcare Providers:  IncredibleEmployment.be  This test is no t yet approved or cleared by the [place] FDA and  has been authorized for detection and/or diagnosis of SARS-CoV-2 by FDA under an Emergency Use Authorization (EUA). This EUA will remain  in effect (meaning this test can be used) for the duration of the COVID-19 declaration under Section 564(b)(1) of the Act, 21 U.S.C.section 360bbb-3(b)(1), unless the authorization is terminated  or revoked sooner.       Influenza A by PCR POSITIVE (A) NEGATIVE Final   Influenza B by PCR NEGATIVE NEGATIVE Final    Comment: (NOTE) The Xpert Xpress SARS-CoV-2/FLU/RSV plus assay is intended as an aid in the diagnosis of influenza from Nasopharyngeal swab specimens and should not be used as a sole basis for treatment. Nasal washings and aspirates are unacceptable for Xpert Xpress SARS-CoV-2/FLU/RSV testing.  Fact Sheet for Patients: EntrepreneurPulse.com.au  Fact Sheet for Healthcare Providers: IncredibleEmployment.be  This test is not yet approved or cleared by the [place] FDA and has been authorized for detection and/or diagnosis of SARS-CoV-2 by FDA under an Emergency Use Authorization (EUA). This EUA will remain in effect  (meaning this test can be used) for the duration of the COVID-19 declaration under Section 564(b)(1) of the Act, 21 U.S.C. section 360bbb-3(b)(1), unless the authorization is terminated or revoked.  Performed at [facility], [place] [address], [place], [place] [address]     Signed:  Berle Mull MD.  [facility] , 4:14 PM

## 2021-10-29 NOTE — Assessment & Plan Note (Signed)
Potassium replaced.  Monitor.

## 2021-10-29 NOTE — Assessment & Plan Note (Signed)
Blood pressure stable.  Monitor. 

## 2021-11-02 ENCOUNTER — Other Ambulatory Visit: Payer: Self-pay

## 2021-11-02 ENCOUNTER — Encounter (HOSPITAL_COMMUNITY): Payer: Self-pay

## 2021-11-02 ENCOUNTER — Emergency Department (HOSPITAL_COMMUNITY): Payer: Medicare Other

## 2021-11-02 ENCOUNTER — Inpatient Hospital Stay (HOSPITAL_COMMUNITY)
Admission: EM | Admit: 2021-11-02 | Discharge: 2021-11-25 | DRG: 871 | Disposition: A | Discharge status: Skilled Nursing Facility | Payer: Medicare Other | Attending: Internal Medicine | Admitting: Internal Medicine

## 2021-11-02 DIAGNOSIS — R1032 Left lower quadrant pain: Secondary | ICD-10-CM | POA: Diagnosis not present

## 2021-11-02 DIAGNOSIS — E8809 Other disorders of plasma-protein metabolism, not elsewhere classified: Secondary | ICD-10-CM | POA: Diagnosis not present

## 2021-11-02 DIAGNOSIS — R1031 Right lower quadrant pain: Secondary | ICD-10-CM

## 2021-11-02 DIAGNOSIS — F17210 Nicotine dependence, cigarettes, uncomplicated: Secondary | ICD-10-CM | POA: Diagnosis present

## 2021-11-02 DIAGNOSIS — R7881 Bacteremia: Secondary | ICD-10-CM | POA: Diagnosis not present

## 2021-11-02 DIAGNOSIS — I959 Hypotension, unspecified: Secondary | ICD-10-CM | POA: Diagnosis not present

## 2021-11-02 DIAGNOSIS — Z7951 Long term (current) use of inhaled steroids: Secondary | ICD-10-CM

## 2021-11-02 DIAGNOSIS — K5900 Constipation, unspecified: Secondary | ICD-10-CM | POA: Diagnosis not present

## 2021-11-02 DIAGNOSIS — R109 Unspecified abdominal pain: Secondary | ICD-10-CM

## 2021-11-02 DIAGNOSIS — J45909 Unspecified asthma, uncomplicated: Secondary | ICD-10-CM | POA: Diagnosis present

## 2021-11-02 DIAGNOSIS — Z8262 Family history of osteoporosis: Secondary | ICD-10-CM

## 2021-11-02 DIAGNOSIS — B9562 Methicillin resistant Staphylococcus aureus infection as the cause of diseases classified elsewhere: Secondary | ICD-10-CM | POA: Diagnosis not present

## 2021-11-02 DIAGNOSIS — M4622 Osteomyelitis of vertebra, cervical region: Secondary | ICD-10-CM | POA: Diagnosis present

## 2021-11-02 DIAGNOSIS — D649 Anemia, unspecified: Secondary | ICD-10-CM | POA: Diagnosis not present

## 2021-11-02 DIAGNOSIS — Z781 Physical restraint status: Secondary | ICD-10-CM | POA: Diagnosis not present

## 2021-11-02 DIAGNOSIS — R17 Unspecified jaundice: Secondary | ICD-10-CM | POA: Diagnosis not present

## 2021-11-02 DIAGNOSIS — Z5309 Procedure and treatment not carried out because of other contraindication: Secondary | ICD-10-CM | POA: Diagnosis present

## 2021-11-02 DIAGNOSIS — D638 Anemia in other chronic diseases classified elsewhere: Secondary | ICD-10-CM | POA: Diagnosis present

## 2021-11-02 DIAGNOSIS — Z808 Family history of malignant neoplasm of other organs or systems: Secondary | ICD-10-CM

## 2021-11-02 DIAGNOSIS — A419 Sepsis, unspecified organism: Secondary | ICD-10-CM | POA: Diagnosis not present

## 2021-11-02 DIAGNOSIS — G039 Meningitis, unspecified: Secondary | ICD-10-CM

## 2021-11-02 DIAGNOSIS — Z888 Allergy status to other drugs, medicaments and biological substances status: Secondary | ICD-10-CM

## 2021-11-02 DIAGNOSIS — J390 Retropharyngeal and parapharyngeal abscess: Secondary | ICD-10-CM | POA: Diagnosis present

## 2021-11-02 DIAGNOSIS — M4620 Osteomyelitis of vertebra, site unspecified: Secondary | ICD-10-CM | POA: Diagnosis present

## 2021-11-02 DIAGNOSIS — I10 Essential (primary) hypertension: Secondary | ICD-10-CM | POA: Diagnosis not present

## 2021-11-02 DIAGNOSIS — Z8616 Personal history of COVID-19: Secondary | ICD-10-CM

## 2021-11-02 DIAGNOSIS — Z20822 Contact with and (suspected) exposure to covid-19: Secondary | ICD-10-CM | POA: Diagnosis not present

## 2021-11-02 DIAGNOSIS — K219 Gastro-esophageal reflux disease without esophagitis: Secondary | ICD-10-CM | POA: Diagnosis present

## 2021-11-02 DIAGNOSIS — E669 Obesity, unspecified: Secondary | ICD-10-CM | POA: Diagnosis not present

## 2021-11-02 DIAGNOSIS — G061 Intraspinal abscess and granuloma: Secondary | ICD-10-CM | POA: Diagnosis not present

## 2021-11-02 DIAGNOSIS — A4102 Sepsis due to Methicillin resistant Staphylococcus aureus: Principal | ICD-10-CM | POA: Diagnosis present

## 2021-11-02 DIAGNOSIS — E785 Hyperlipidemia, unspecified: Secondary | ICD-10-CM | POA: Diagnosis present

## 2021-11-02 DIAGNOSIS — E871 Hypo-osmolality and hyponatremia: Secondary | ICD-10-CM | POA: Diagnosis not present

## 2021-11-02 DIAGNOSIS — K5730 Diverticulosis of large intestine without perforation or abscess without bleeding: Secondary | ICD-10-CM | POA: Diagnosis present

## 2021-11-02 DIAGNOSIS — Z8249 Family history of ischemic heart disease and other diseases of the circulatory system: Secondary | ICD-10-CM

## 2021-11-02 DIAGNOSIS — M4642 Discitis, unspecified, cervical region: Secondary | ICD-10-CM | POA: Diagnosis not present

## 2021-11-02 DIAGNOSIS — N390 Urinary tract infection, site not specified: Secondary | ICD-10-CM

## 2021-11-02 DIAGNOSIS — Z8546 Personal history of malignant neoplasm of prostate: Secondary | ICD-10-CM

## 2021-11-02 DIAGNOSIS — J9601 Acute respiratory failure with hypoxia: Secondary | ICD-10-CM | POA: Diagnosis not present

## 2021-11-02 DIAGNOSIS — G9341 Metabolic encephalopathy: Secondary | ICD-10-CM | POA: Diagnosis not present

## 2021-11-02 DIAGNOSIS — Z6833 Body mass index (BMI) 33.0-33.9, adult: Secondary | ICD-10-CM | POA: Diagnosis not present

## 2021-11-02 DIAGNOSIS — R6520 Severe sepsis without septic shock: Secondary | ICD-10-CM | POA: Diagnosis present

## 2021-11-02 DIAGNOSIS — Z7982 Long term (current) use of aspirin: Secondary | ICD-10-CM

## 2021-11-02 DIAGNOSIS — M4649 Discitis, unspecified, multiple sites in spine: Secondary | ICD-10-CM | POA: Diagnosis present

## 2021-11-02 DIAGNOSIS — M4647 Discitis, unspecified, lumbosacral region: Secondary | ICD-10-CM

## 2021-11-02 DIAGNOSIS — M549 Dorsalgia, unspecified: Secondary | ICD-10-CM

## 2021-11-02 DIAGNOSIS — M4626 Osteomyelitis of vertebra, lumbar region: Secondary | ICD-10-CM | POA: Diagnosis present

## 2021-11-02 DIAGNOSIS — G062 Extradural and subdural abscess, unspecified: Secondary | ICD-10-CM | POA: Diagnosis not present

## 2021-11-02 DIAGNOSIS — M542 Cervicalgia: Secondary | ICD-10-CM

## 2021-11-02 DIAGNOSIS — Z881 Allergy status to other antibiotic agents status: Secondary | ICD-10-CM

## 2021-11-02 DIAGNOSIS — Z79899 Other long term (current) drug therapy: Secondary | ICD-10-CM

## 2021-11-02 LAB — COMPREHENSIVE METABOLIC PANEL
ALT: 34 U/L (ref 0–44)
AST: 20 U/L (ref 15–41)
Albumin: 3.2 g/dL — ABNORMAL LOW (ref 3.5–5.0)
Alkaline Phosphatase: 66 U/L (ref 38–126)
Anion gap: 10 (ref 5–15)
BUN: 29 mg/dL — ABNORMAL HIGH (ref 8–23)
CO2: 27 mmol/L (ref 22–32)
Calcium: 8.9 mg/dL (ref 8.9–10.3)
Chloride: 95 mmol/L — ABNORMAL LOW (ref 98–111)
Creatinine, Ser: 0.92 mg/dL (ref 0.61–1.24)
GFR, Estimated: >60 mL/min (ref >60)
Glucose, Bld: 153 mg/dL — ABNORMAL HIGH (ref 70–99)
Potassium: 3.7 mmol/L (ref 3.5–5.1)
Sodium: 132 mmol/L — ABNORMAL LOW (ref 135–145)
Total Bilirubin: 0.8 mg/dL (ref 0.3–1.2)
Total Protein: 7.7 g/dL (ref 6.5–8.1)

## 2021-11-02 LAB — RESP PANEL BY RT-PCR (FLU A&B, COVID) ARPGX2
Influenza A by PCR: NEGATIVE
Influenza B by PCR: NEGATIVE
SARS Coronavirus 2 by RT PCR: NEGATIVE

## 2021-11-02 LAB — URINALYSIS, ROUTINE W REFLEX MICROSCOPIC
Bilirubin Urine: NEGATIVE
Glucose, UA: NEGATIVE mg/dL
Ketones, ur: NEGATIVE mg/dL
Leukocytes,Ua: NEGATIVE
Nitrite: POSITIVE — AB
Protein, ur: 100 mg/dL — AB
Specific Gravity, Urine: 1.004 — ABNORMAL LOW (ref 1.005–1.030)
pH: 6 (ref 5.0–8.0)

## 2021-11-02 LAB — CBC WITH DIFFERENTIAL/PLATELET
Abs Immature Granulocytes: 0.15 10*3/uL — ABNORMAL HIGH (ref 0.00–0.07)
Basophils Absolute: 0 10*3/uL (ref 0.0–0.1)
Basophils Relative: 0 %
Eosinophils Absolute: 0 10*3/uL (ref 0.0–0.5)
Eosinophils Relative: 0 %
HCT: 37.4 % — ABNORMAL LOW (ref 39.0–52.0)
Hemoglobin: 13.1 g/dL (ref 13.0–17.0)
Immature Granulocytes: 1 %
Lymphocytes Relative: 3 %
Lymphs Abs: 0.5 10*3/uL — ABNORMAL LOW (ref 0.7–4.0)
MCH: 31.3 pg (ref 26.0–34.0)
MCHC: 35 g/dL (ref 30.0–36.0)
MCV: 89.3 fL (ref 80.0–100.0)
Monocytes Absolute: 1.3 10*3/uL — ABNORMAL HIGH (ref 0.1–1.0)
Monocytes Relative: 7 %
Neutro Abs: 17.4 10*3/uL — ABNORMAL HIGH (ref 1.7–7.7)
Neutrophils Relative %: 89 %
Platelets: 292 10*3/uL (ref 150–400)
RBC: 4.19 MIL/uL — ABNORMAL LOW (ref 4.22–5.81)
RDW: 13.9 % (ref 11.5–15.5)
WBC: 19.4 10*3/uL — ABNORMAL HIGH (ref 4.0–10.5)
nRBC: 0 % (ref 0.0–0.2)

## 2021-11-02 LAB — SEDIMENTATION RATE: Sed Rate: 95 mm/hr — ABNORMAL HIGH (ref 0–16)

## 2021-11-02 LAB — PROTIME-INR
INR: 1.3 — ABNORMAL HIGH (ref 0.8–1.2)
Prothrombin Time: 15.7 seconds — ABNORMAL HIGH (ref 11.4–15.2)

## 2021-11-02 LAB — LACTIC ACID, PLASMA: Lactic Acid, Venous: 1.2 mmol/L (ref 0.5–1.9)

## 2021-11-02 LAB — C-REACTIVE PROTEIN: CRP: 33.5 mg/dL — ABNORMAL HIGH (ref <1.0)

## 2021-11-02 LAB — APTT: aPTT: 24 seconds (ref 24–36)

## 2021-11-02 IMAGING — DX DG CHEST 1V PORT
1 series · 1 of 1 positions shown · non-contrast
Comparison: Chest x-ray dated [DATE]

CLINICAL DATA: Cough

EXAM:
PORTABLE CHEST 1 VIEW

[chest ap]
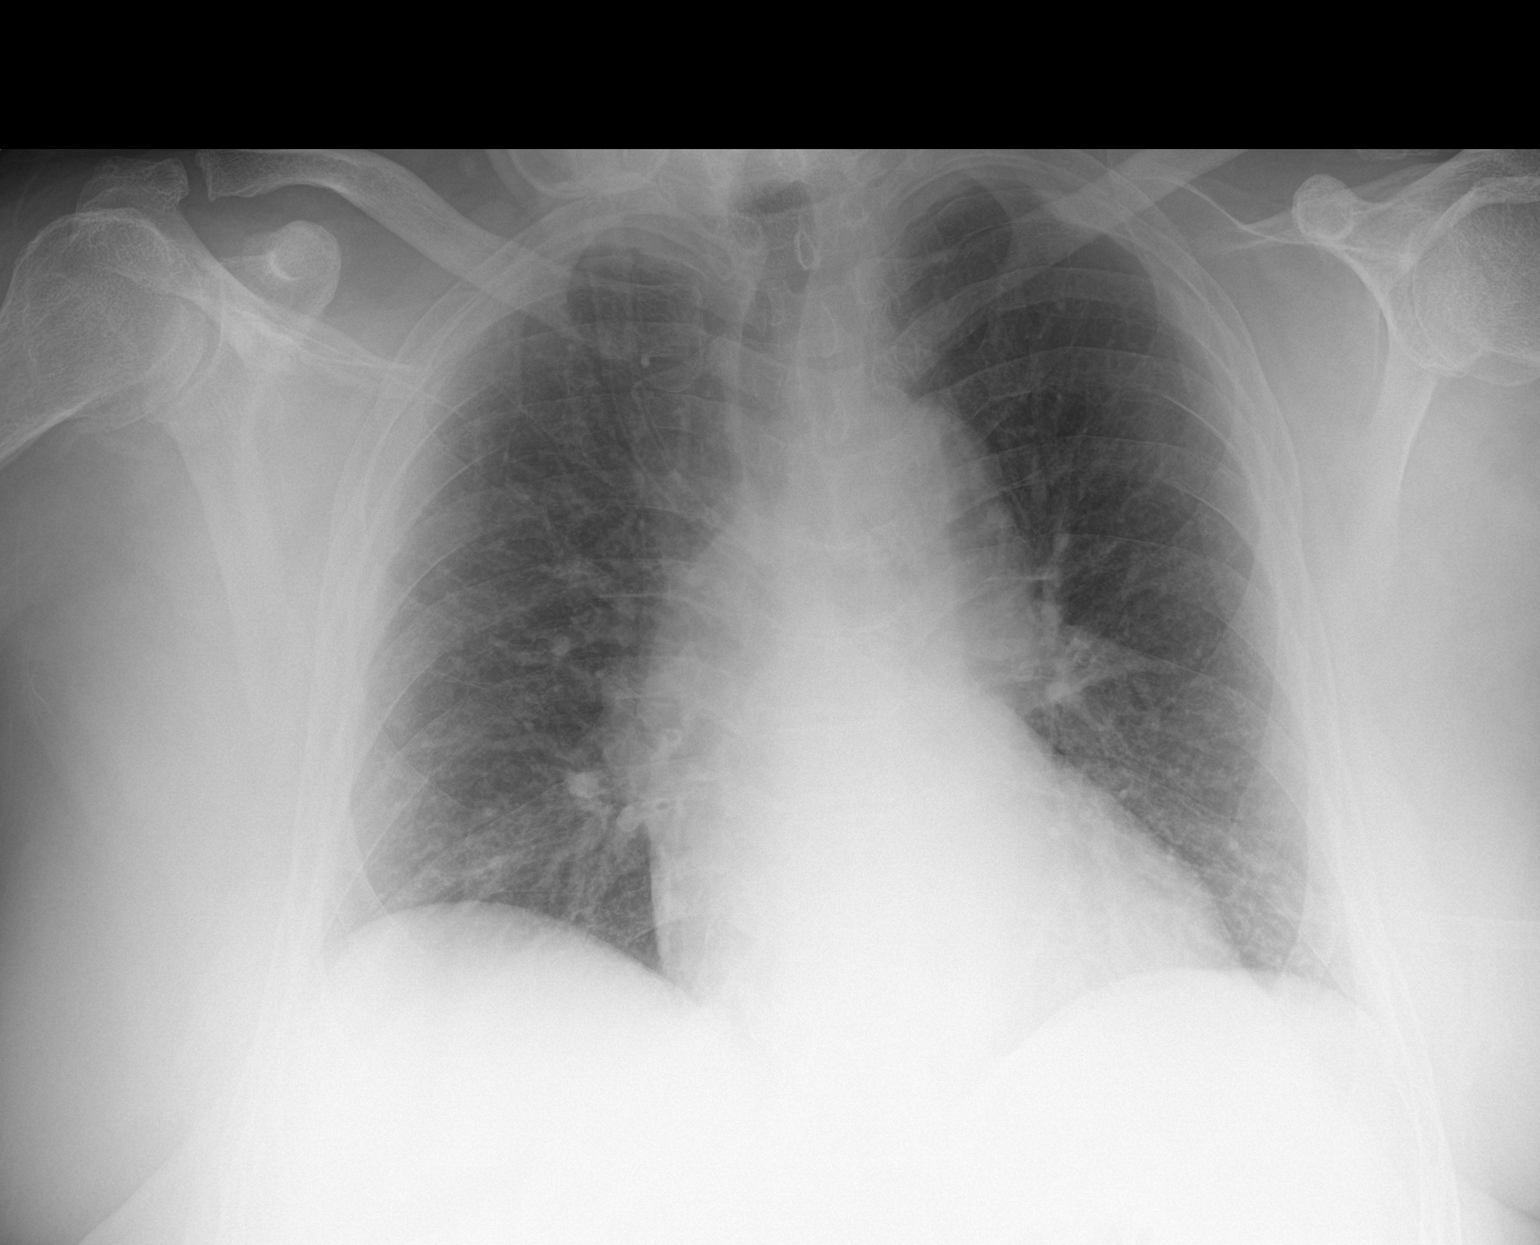

[1 of 1 positions shown; findings below may reference images not displayed]

FINDINGS: Cardiac and mediastinal contours within normal limits. Slightly
decreased bilateral patchy airspace disease. No large pleural
effusion or pneumothorax.
IMPRESSION: Bilateral patchy airspace disease is likely slightly decreased
compared with prior exam, although evaluation is somewhat limited
due to differences in technique. Recommend follow-up PA and lateral
chest x-ray in 6-8 weeks to ensure complete resolution.

## 2021-11-02 IMAGING — MR MR THORACIC SPINE WO/W CM
10 of 17 series · 28 of 48 positions shown · IV contrast (gadavist)
Comparison: Retropharyngeal edema.

CLINICAL DATA: Retropharyngeal edema. Meningitis/CNS infection
suspected.

EXAM:
MRI CERVICAL, THORACIC AND LUMBAR SPINE WITHOUT AND WITH CONTRAST
TECHNIQUE: Multiplanar and multiecho pulse sequences of the cervical spine, to
include the craniocervical junction and cervicothoracic junction,
and thoracic and lumbar spine, were obtained without and with
intravenous contrast.
CONTRAST:  10mL GADAVIST GADOBUTROL 1 MMOL/ML IV SOLN

[Series 18: T1 · sagittal · 4.0mm · 1.15mm/px · 3 of 14 slices shown (1 of 6)]
[im 1/14]
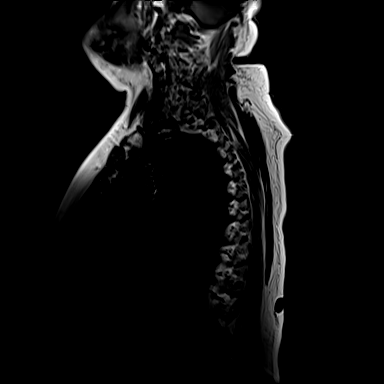
[im 7/14]
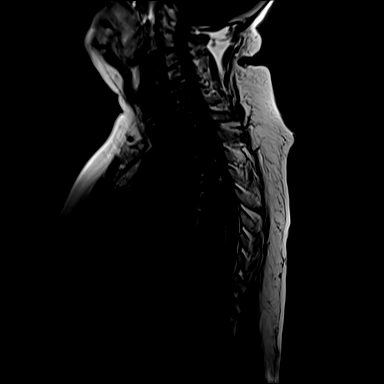
[im 14/14]
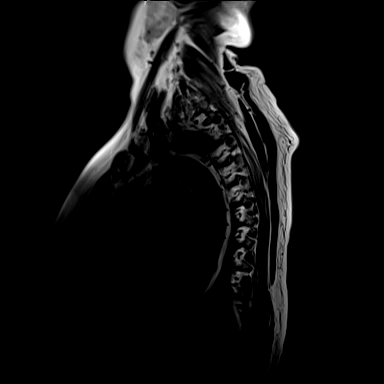

[Series 19: T1 · sagittal · 4.0mm · 1.00mm/px · 3 of 22 slices shown (2 of 6)]
[im 1/22]
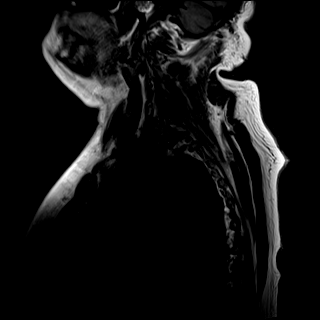
[im 11/22]
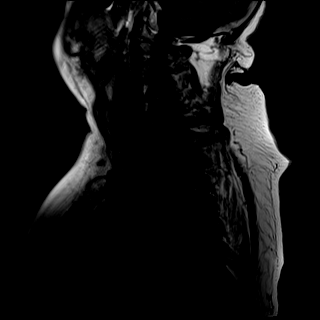
[im 22/22]
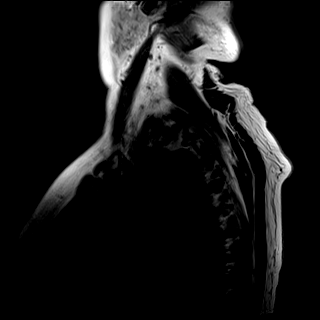

[Series 20: T1 · sagittal · 4.0mm · 1.19mm/px · 2 of 22 slices shown (3 of 6)]
[im 1/22]
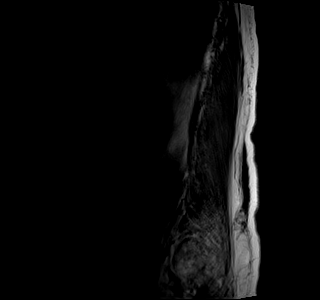
[im 22/22]
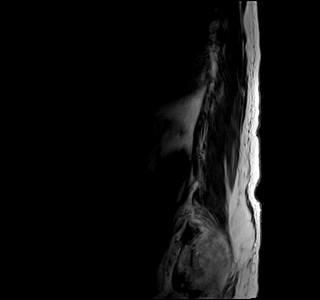

[Series 21: T1 · 2 of 21 slices shown (4 of 6)]
[im 1/21]
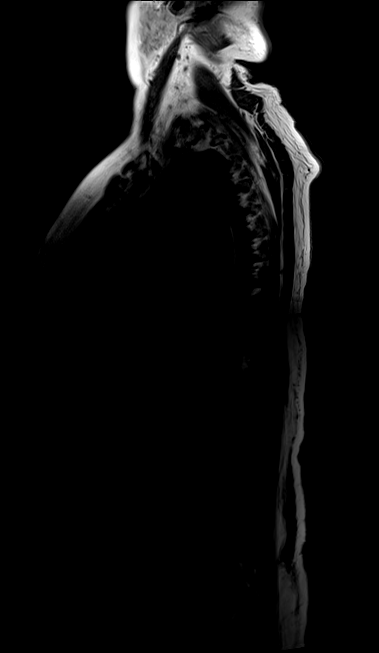
[im 21/21]
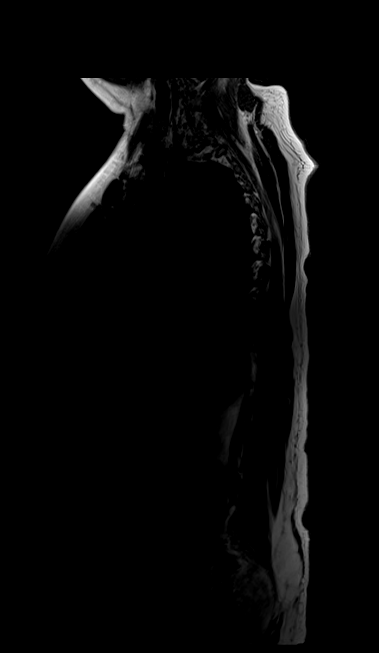

[Series 22: T1 · 2 of 21 slices shown (5 of 6)]
[im 1/21]
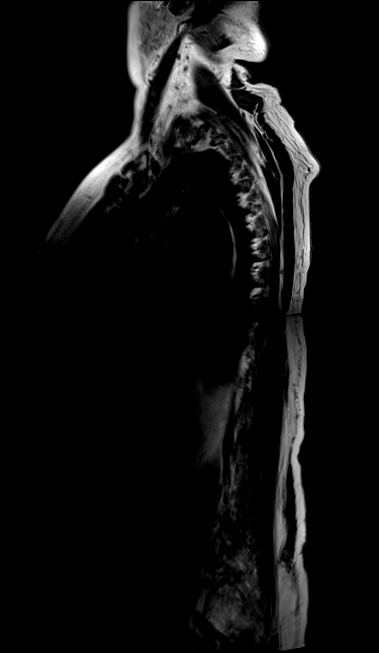
[im 21/21]
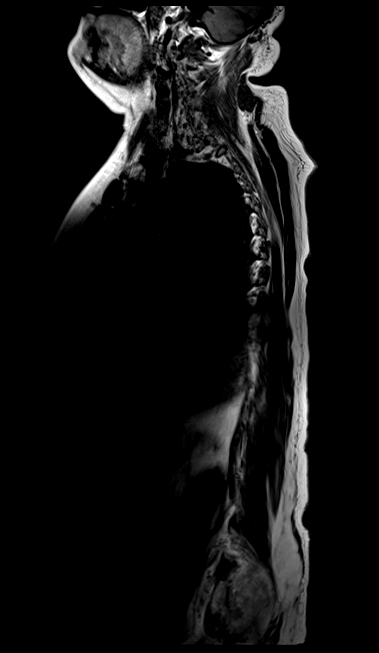

[Series 27: T2 · sagittal · 4.0mm · 1.00mm/px · 2 of 22 slices shown (1 of 4)]
[im 1/22]
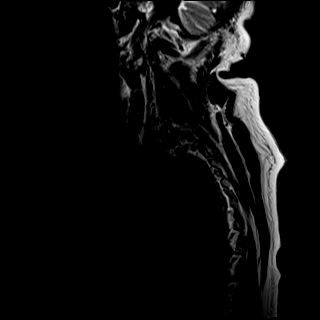
[im 22/22]
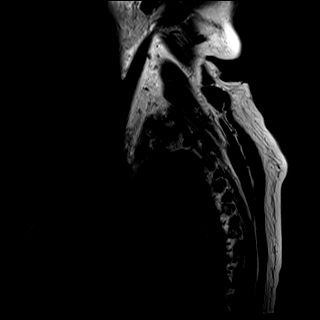

[Series 31: T2 · sagittal · 4.0mm · 1.00mm/px · 2 of 22 slices shown (2 of 4)]
[im 1/22]
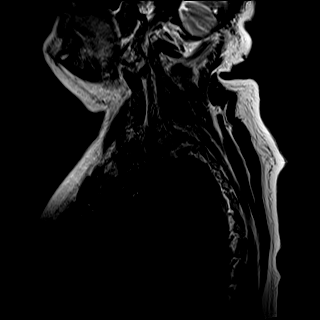
[im 22/22]
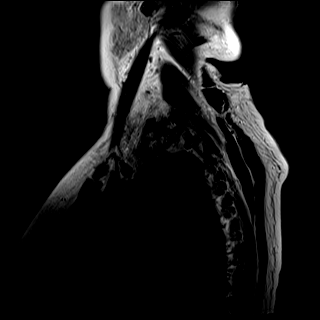

[Series 35: T2 · sagittal · 4.0mm · 0.83mm/px · 2 of 22 slices shown (3 of 4)]
[im 1/22]
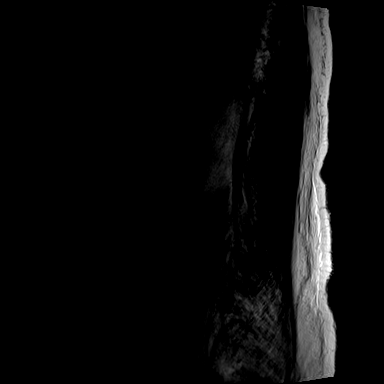
[im 22/22]
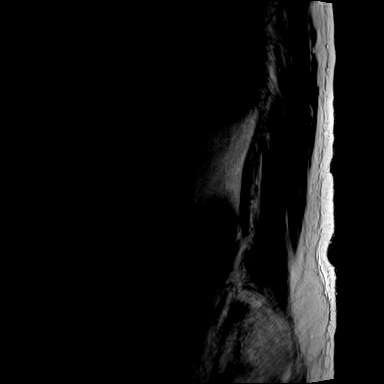

[Series 36: T2 · axial · 4.0mm · 0.78mm/px · z∈[-730,-478]mm · 6 of 54 slices shown (4 of 4)]
[im 1/54]
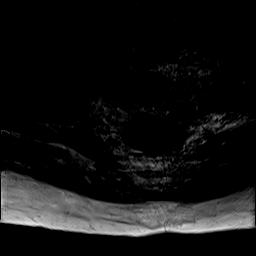
[im 11/54]
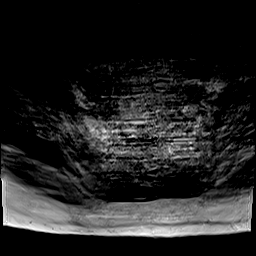
[im 22/54]
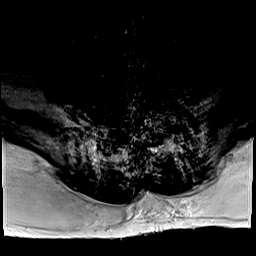
[im 32/54]
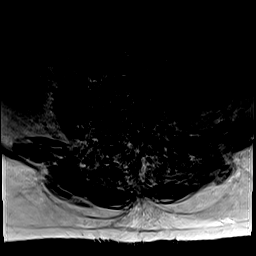
[im 43/54]
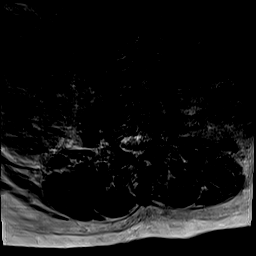
[im 54/54]
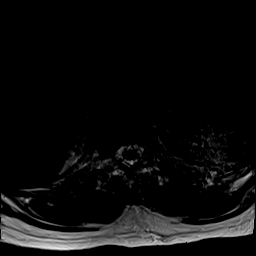

[Series 37: T1 · axial · 4.0mm · 0.39mm/px · z∈[-730,-587]mm · 4 of 54 slices shown (6 of 6)]
[im 1/54]
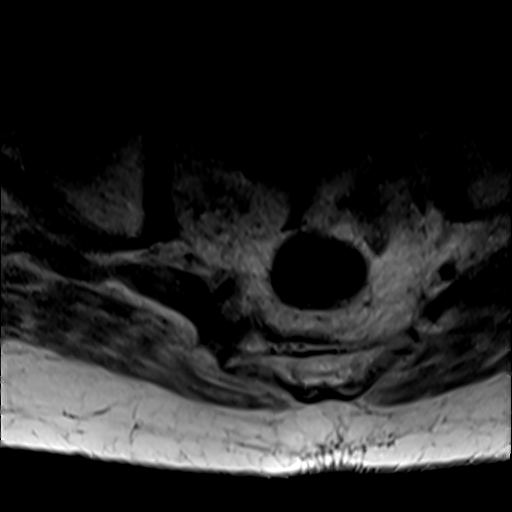
[im 11/54]
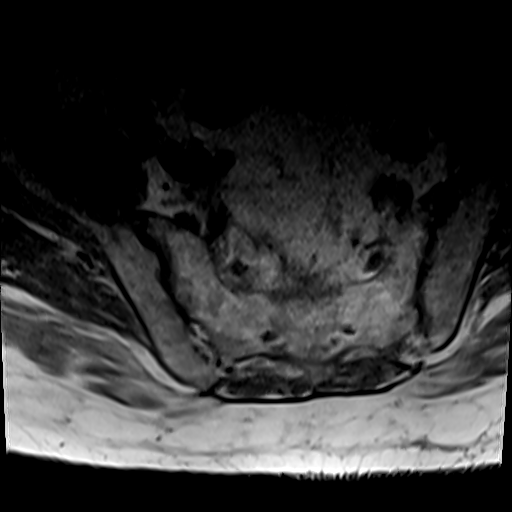
[im 22/54]
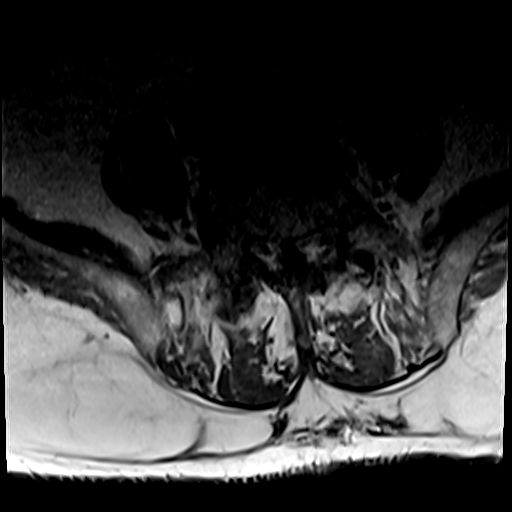
[im 32/54]
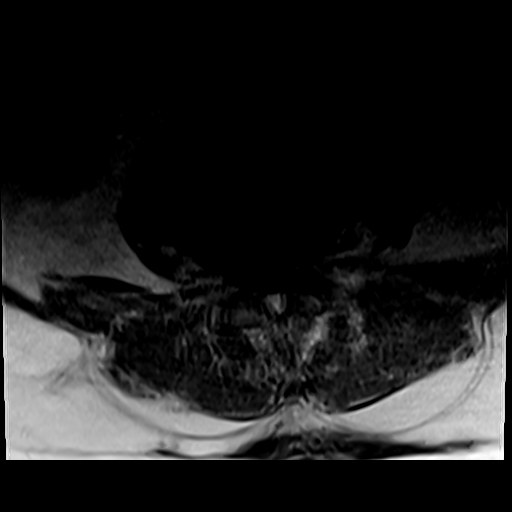

[28 of 48 positions shown; findings below may reference images not displayed]

FINDINGS: MRI CERVICAL SPINE FINDINGS

Alignment: No significant listhesis is present. Mild straightening
of the normal cervical lordosis is noted.

Vertebrae: Chronic fatty endplate marrow changes are noted from C3-4
through C7-T1. Vertebral body heights are maintained.

Cord: Normal signal and morphology.

Posterior Fossa, vertebral arteries, paraspinal tissues:

Prevertebral edema extends from the skull base through C5-6.
Peripheral postcontrast enhancement of the collection is noted.

Postcontrast images are moderately degraded by patient motion. No
definite intracanalicular enhancement is present in the cervical
spine.

Disc levels:

Axial images were not obtained in the cervical spine. Multilevel
disc disease is present in the cervical spine with effacement of the
ventral CSF at C3-4, C4-5, C5-6, and C6-7. Foraminal disease is
greatest at C5-6 on the left and C4-5 on the right.

MRI THORACIC SPINE FINDINGS

Alignment: No significant listhesis is present in the thoracic
spine. Straightening of the normal kyphosis is noted.

Vertebrae: Fatty endplate marrow changes present at T6-7. Marrow
signal and vertebral body heights normal.

Cord: Normal signal and morphology. No significant intracanalicular
enhancement is present in the thoracic spine.

Paraspinal and other soft tissues: Limited imaging the abdomen is
unremarkable. There is no significant adenopathy. No solid organ
lesions are present.

Disc levels:

Mild disc bulging is present at T5-6 and T6-7 without significant
stenosis. Mild foraminal narrowing is present at T7-8, T8-9 and
T9-10, right greater than left, secondary to facet spurring.

MRI LUMBAR SPINE FINDINGS

Segmentation: 5 non rib-bearing lumbar type vertebral bodies are
present. The lowest fully formed vertebral body is L5.

Alignment: No significant listhesis is present. Straightening of the
normal lumbar lordosis is noted.

Vertebrae: Study is moderately degraded by patient motion. Chronic
fatty endplate marrow changes are noted anteriorly at L3-4.
Edematous changes are present anteriorly at L4-5 with some
enhancement of the endplates and fluid in the disc space at L4-5.

Conus medullaris and cauda equina: Conus extends to the L1 level.
Conus and cauda equina appear normal.

Paraspinal and other soft tissues: Limited imaging the abdomen is
unremarkable. There is no significant adenopathy. No solid organ
lesions are present.

Disc levels:

T12-L1: Negative.

L1-2: Negative.

L2-3: Mild epidural enhancement is noted, anteriorly more
posteriorly. Broad-based disc protrusion extends into the foramina
bilaterally with moderate foraminal narrowing bilaterally.

L3-4: Broad-based disc protrusion is present. Moderate facet
hypertrophy is noted bilaterally. Moderate central bilateral
foraminal stenosis is present. Diffuse epidural enhancement is
present. No discrete epidural abscess is present.

L4-5: Fluid is present in the disc space. Enhancement is present
disc space and endplates. Extensive epidural enhancement is present.
Central and foraminal stenosis present.

L5-S1: Loss of disc height is present. Diffuse epidural enhancement
present. Central disc protrusion contributes to mild central canal
stenosis. Facet spurring contributes to mild foraminal narrowing
bilaterally.
IMPRESSION: 1. Fluid and enhancement within the disc space at L4-5 with adjacent
edema and enhancement of the endplates. Findings are consistent with
discitis-osteomyelitis.
2. Extensive epidural enhancement in the lower lumbar spine L2-3
through the sacrum compatible with infection. Recommend lumbar
puncture further evaluation.
3. Prevertebral edema with peripheral postcontrast enhancement
extending from the skull base through C5-6. Focal source for
infection in the upper cervical spine not identified.
4. The study is moderately degraded by patient motion.
5. Multilevel spondylosis of the cervical spine as described.
6. Diffuse epidural enhancement at L2-3, L3-4, L4-5, and L5-S1
without discrete epidural abscess.
7. Moderate central canal and bilateral foraminal stenosis at L3-4.
8. Mild foraminal narrowing bilaterally at T7-8, T8-9 and T9-10
secondary to facet spurring.

These results will be called to the ordering clinician or
representative by the Radiologist Assistant, and communication
documented in the PACS or [REDACTED].

## 2021-11-02 IMAGING — MR MR CERVICAL SPINE WO/W CM
10 of 17 series · 28 of 48 positions shown · IV contrast (gadavist)
Comparison: Retropharyngeal edema.

CLINICAL DATA: Retropharyngeal edema. Meningitis/CNS infection
suspected.

EXAM:
MRI CERVICAL, THORACIC AND LUMBAR SPINE WITHOUT AND WITH CONTRAST
TECHNIQUE: Multiplanar and multiecho pulse sequences of the cervical spine, to
include the craniocervical junction and cervicothoracic junction,
and thoracic and lumbar spine, were obtained without and with
intravenous contrast.
CONTRAST:  10mL GADAVIST GADOBUTROL 1 MMOL/ML IV SOLN

[Series 20: T1 · sagittal · 4.0mm · 1.15mm/px · 3 of 14 slices shown (1 of 6)]
[im 1/14]
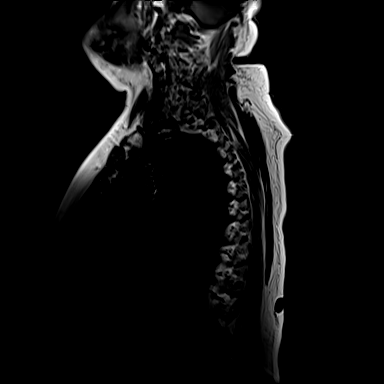
[im 7/14]
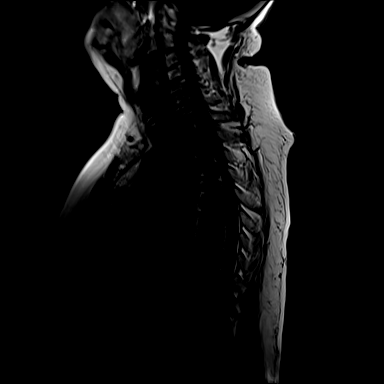
[im 14/14]
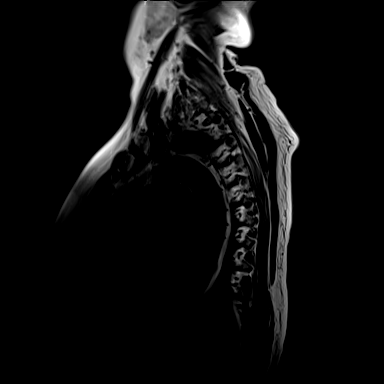

[Series 21: T1 · sagittal · 4.0mm · 1.00mm/px · 3 of 22 slices shown (2 of 6)]
[im 1/22]
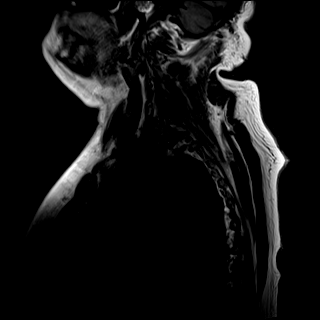
[im 11/22]
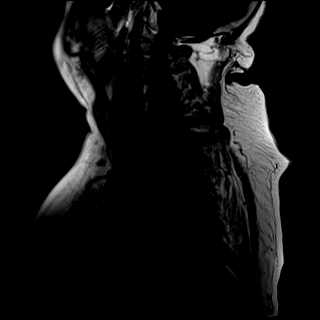
[im 22/22]
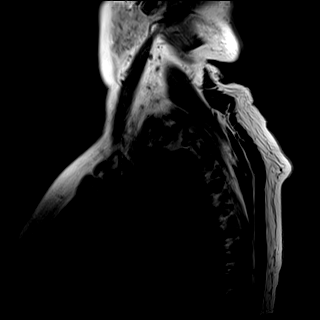

[Series 22: T1 · sagittal · 4.0mm · 1.19mm/px · 2 of 22 slices shown (3 of 6)]
[im 1/22]
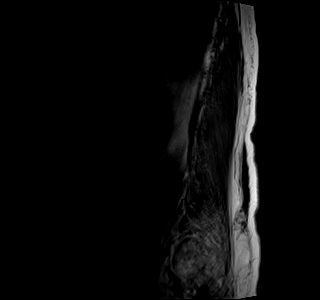
[im 22/22]
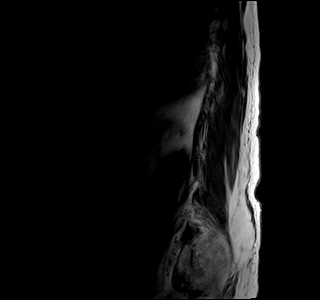

[Series 23: T1 · 2 of 21 slices shown (4 of 6)]
[im 1/21]
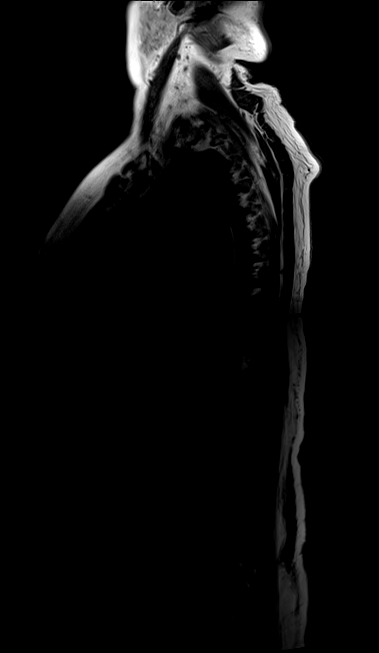
[im 21/21]
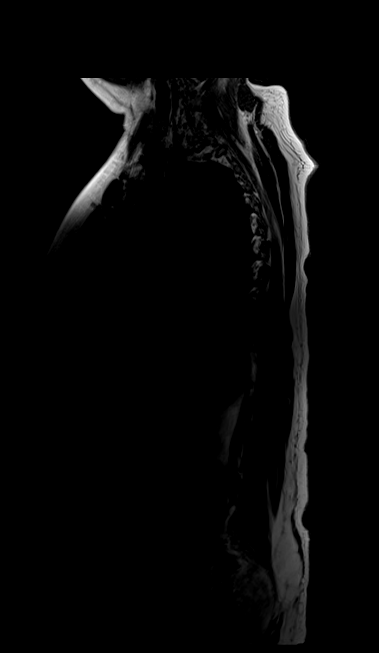

[Series 24: T1 · 2 of 21 slices shown (5 of 6)]
[im 1/21]
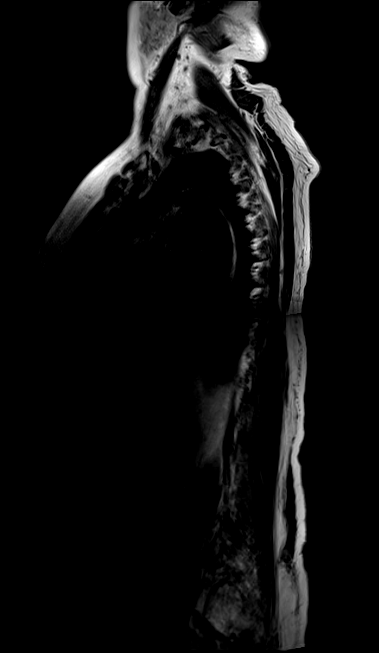
[im 21/21]
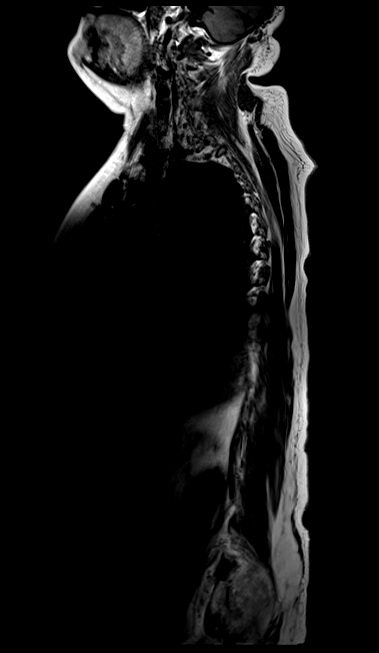

[Series 29: T2 · sagittal · 4.0mm · 1.00mm/px · 2 of 22 slices shown (1 of 4)]
[im 1/22]
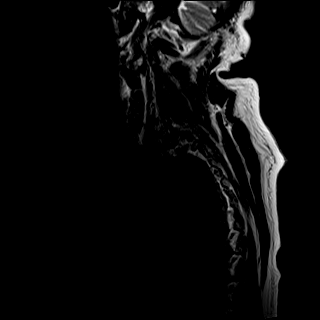
[im 22/22]
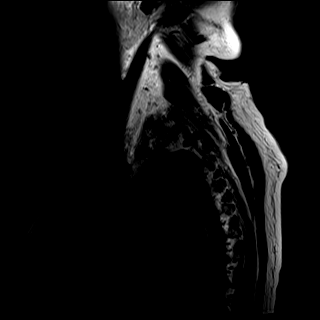

[Series 33: T2 · sagittal · 4.0mm · 1.00mm/px · 2 of 22 slices shown (2 of 4)]
[im 1/22]
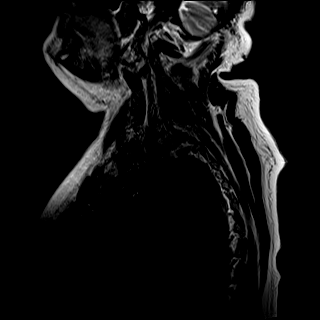
[im 22/22]
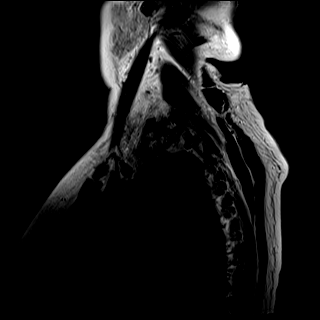

[Series 37: T2 · sagittal · 4.0mm · 0.83mm/px · 2 of 22 slices shown (3 of 4)]
[im 1/22]
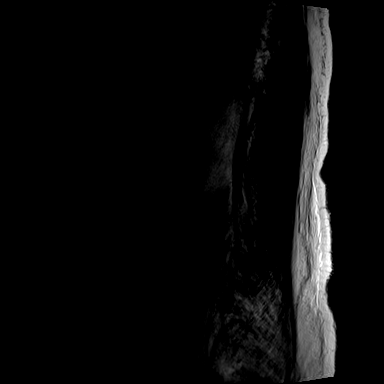
[im 22/22]
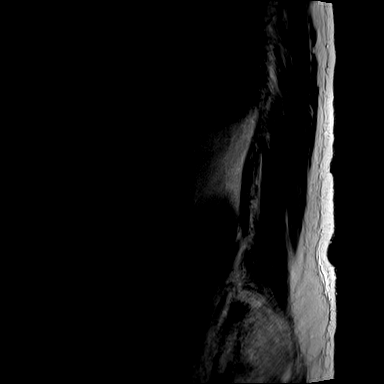

[Series 38: T2 · axial · 4.0mm · 0.78mm/px · z∈[-730,-478]mm · 6 of 54 slices shown (4 of 4)]
[im 1/54]
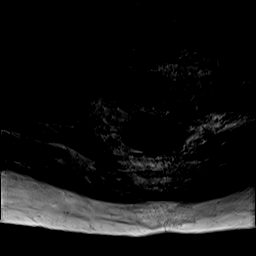
[im 11/54]
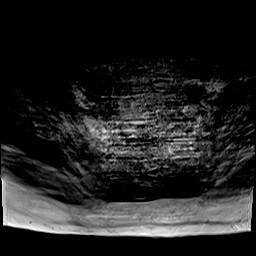
[im 22/54]
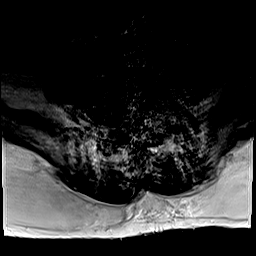
[im 32/54]
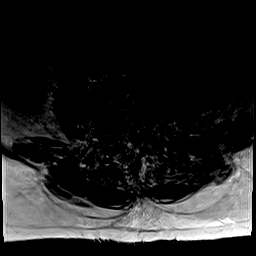
[im 43/54]
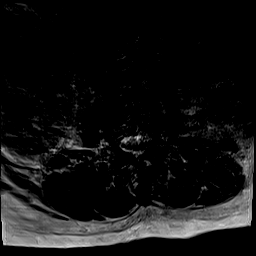
[im 54/54]
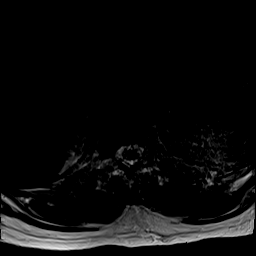

[Series 39: T1 · axial · 4.0mm · 0.39mm/px · z∈[-730,-587]mm · 4 of 54 slices shown (6 of 6)]
[im 1/54]
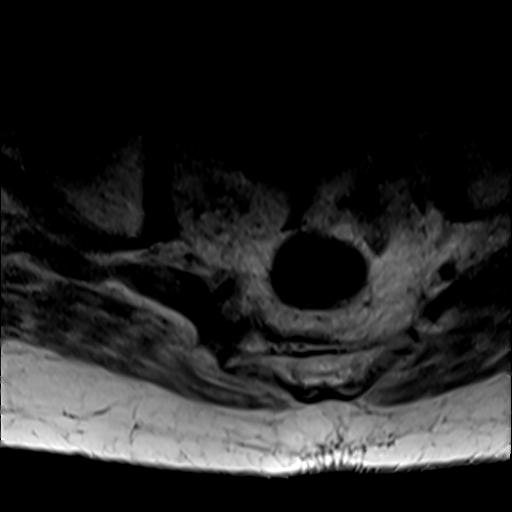
[im 11/54]
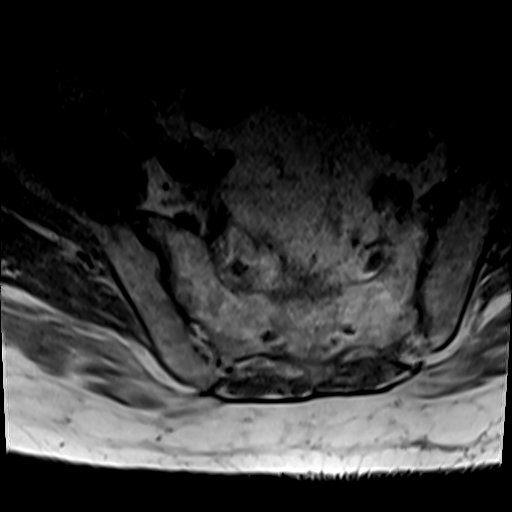
[im 22/54]
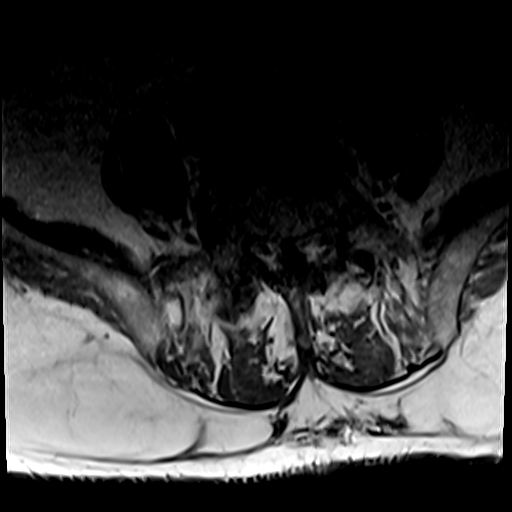
[im 32/54]
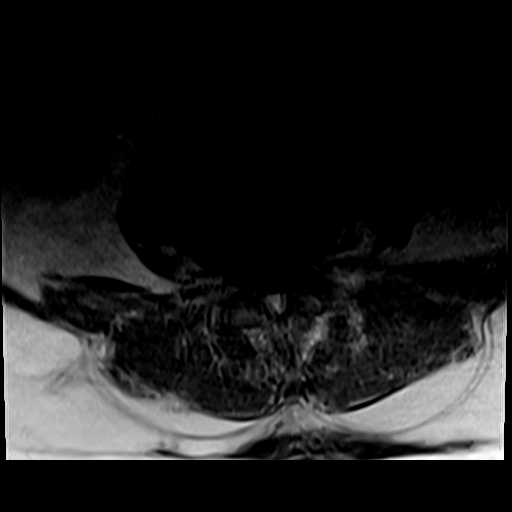

[28 of 48 positions shown; findings below may reference images not displayed]

FINDINGS: MRI CERVICAL SPINE FINDINGS

Alignment: No significant listhesis is present. Mild straightening
of the normal cervical lordosis is noted.

Vertebrae: Chronic fatty endplate marrow changes are noted from C3-4
through C7-T1. Vertebral body heights are maintained.

Cord: Normal signal and morphology.

Posterior Fossa, vertebral arteries, paraspinal tissues:

Prevertebral edema extends from the skull base through C5-6.
Peripheral postcontrast enhancement of the collection is noted.

Postcontrast images are moderately degraded by patient motion. No
definite intracanalicular enhancement is present in the cervical
spine.

Disc levels:

Axial images were not obtained in the cervical spine. Multilevel
disc disease is present in the cervical spine with effacement of the
ventral CSF at C3-4, C4-5, C5-6, and C6-7. Foraminal disease is
greatest at C5-6 on the left and C4-5 on the right.

MRI THORACIC SPINE FINDINGS

Alignment: No significant listhesis is present in the thoracic
spine. Straightening of the normal kyphosis is noted.

Vertebrae: Fatty endplate marrow changes present at T6-7. Marrow
signal and vertebral body heights normal.

Cord: Normal signal and morphology. No significant intracanalicular
enhancement is present in the thoracic spine.

Paraspinal and other soft tissues: Limited imaging the abdomen is
unremarkable. There is no significant adenopathy. No solid organ
lesions are present.

Disc levels:

Mild disc bulging is present at T5-6 and T6-7 without significant
stenosis. Mild foraminal narrowing is present at T7-8, T8-9 and
T9-10, right greater than left, secondary to facet spurring.

MRI LUMBAR SPINE FINDINGS

Segmentation: 5 non rib-bearing lumbar type vertebral bodies are
present. The lowest fully formed vertebral body is L5.

Alignment: No significant listhesis is present. Straightening of the
normal lumbar lordosis is noted.

Vertebrae: Study is moderately degraded by patient motion. Chronic
fatty endplate marrow changes are noted anteriorly at L3-4.
Edematous changes are present anteriorly at L4-5 with some
enhancement of the endplates and fluid in the disc space at L4-5.

Conus medullaris and cauda equina: Conus extends to the L1 level.
Conus and cauda equina appear normal.

Paraspinal and other soft tissues: Limited imaging the abdomen is
unremarkable. There is no significant adenopathy. No solid organ
lesions are present.

Disc levels:

T12-L1: Negative.

L1-2: Negative.

L2-3: Mild epidural enhancement is noted, anteriorly more
posteriorly. Broad-based disc protrusion extends into the foramina
bilaterally with moderate foraminal narrowing bilaterally.

L3-4: Broad-based disc protrusion is present. Moderate facet
hypertrophy is noted bilaterally. Moderate central bilateral
foraminal stenosis is present. Diffuse epidural enhancement is
present. No discrete epidural abscess is present.

L4-5: Fluid is present in the disc space. Enhancement is present
disc space and endplates. Extensive epidural enhancement is present.
Central and foraminal stenosis present.

L5-S1: Loss of disc height is present. Diffuse epidural enhancement
present. Central disc protrusion contributes to mild central canal
stenosis. Facet spurring contributes to mild foraminal narrowing
bilaterally.
IMPRESSION: 1. Fluid and enhancement within the disc space at L4-5 with adjacent
edema and enhancement of the endplates. Findings are consistent with
discitis-osteomyelitis.
2. Extensive epidural enhancement in the lower lumbar spine L2-3
through the sacrum compatible with infection. Recommend lumbar
puncture further evaluation.
3. Prevertebral edema with peripheral postcontrast enhancement
extending from the skull base through C5-6. Focal source for
infection in the upper cervical spine not identified.
4. The study is moderately degraded by patient motion.
5. Multilevel spondylosis of the cervical spine as described.
6. Diffuse epidural enhancement at L2-3, L3-4, L4-5, and L5-S1
without discrete epidural abscess.
7. Moderate central canal and bilateral foraminal stenosis at L3-4.
8. Mild foraminal narrowing bilaterally at T7-8, T8-9 and T9-10
secondary to facet spurring.

These results will be called to the ordering clinician or
representative by the Radiologist Assistant, and communication
documented in the PACS or [REDACTED].

## 2021-11-02 IMAGING — CT CT ANGIO NECK
1 of 10 series · 6 of 33 positions shown · IV contrast (OMNIPAQUE 350)
Comparison: None.

CLINICAL DATA: Headache with severe neck pain.



[Series 11: ax thin · axial · 0.46mm/px · z∈[-225,+16]mm · 6 of 338 slices shown]
[im 49/338  soft-tissue]
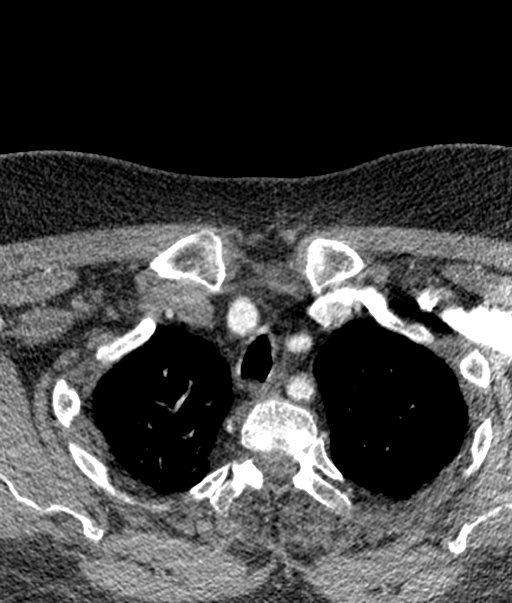
[im 97/338  bone]
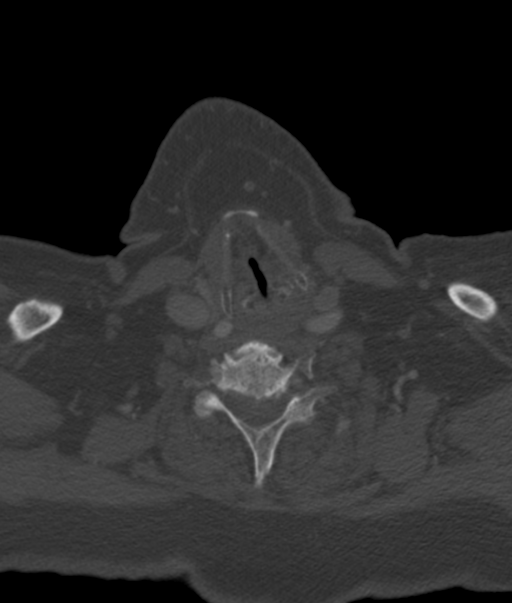
[im 145/338  soft-tissue]
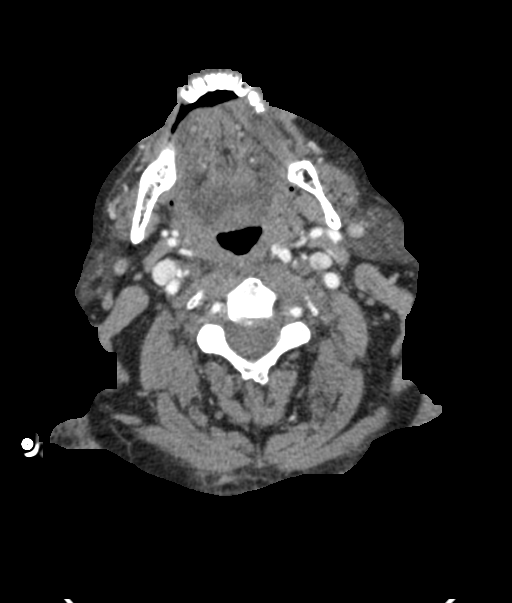
[im 193/338  bone]
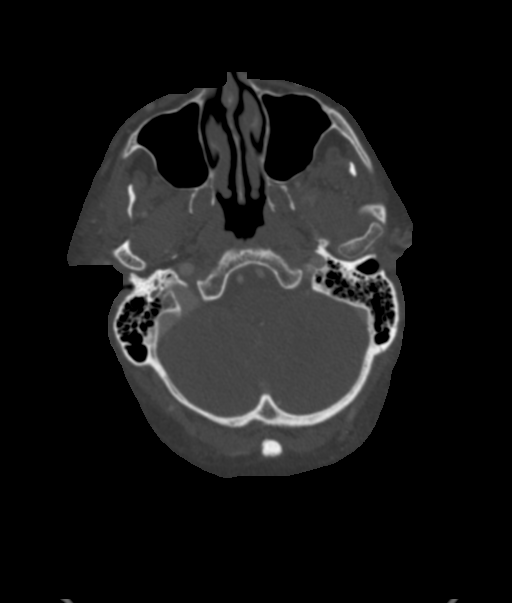
[im 241/338  soft-tissue]
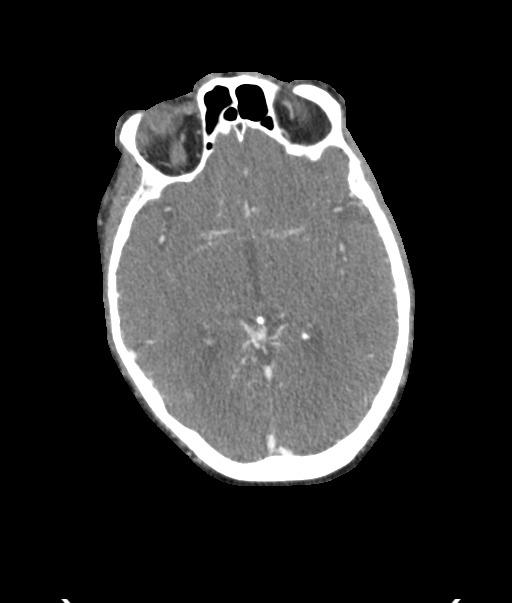
[im 289/338  bone]
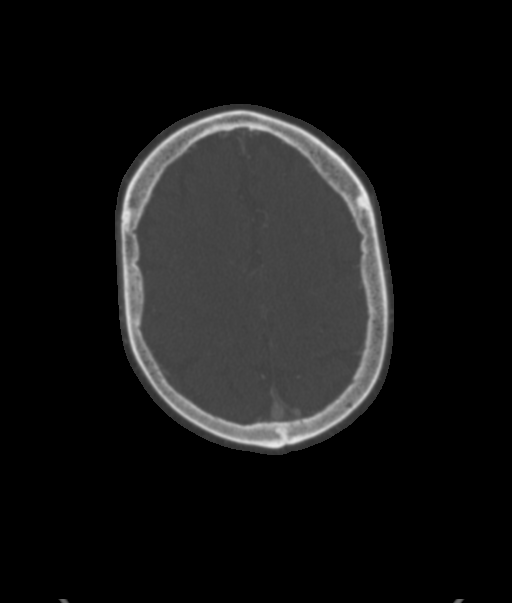

[6 of 33 positions shown; findings below may reference images not displayed]

FINDINGS: CT HEAD FINDINGS

Brain: No evidence of acute infarction, hemorrhage, hydrocephalus,
extra-axial collection or mass lesion/mass effect. Mild chronic
small vessel disease in the hemispheric white matter. Chronic lacune
at the right caudate head.

Vascular: See below

Skull: Negative

Sinuses: Negative

Orbits: Negative

Review of the MIP images confirms the above findings

CTA NECK FINDINGS

Aortic arch: Normal

Right carotid system: Vessels are smooth and widely patent

Left carotid system: Vessels are smooth and widely patent. Minimal
atheromatous changes.

Vertebral arteries: No proximal subclavian stenosis. Both vertebral
arteries are smoothly contoured and widely patent.

Skeleton: Subtle but convincing retropharyngeal/prevertebral fat
haziness. No associated muscular calcification or focal endplate
erosion. The cervical spine is diffusely degenerated. No evidence of
mucosal inflammation the level of the throat.

Other neck: As above

Upper chest: Negative

Review of the MIP images confirms the above findings

CTA HEAD FINDINGS

Anterior circulation: No significant stenosis, proximal occlusion,
aneurysm, or vascular malformation.

Posterior circulation: Vessels are smooth and diffusely patent.
Negative for aneurysm or vascular malformation.

Venous sinuses: Unremarkable

Anatomic variants: No significant variant.

Review of the MIP images confirms the above findings
IMPRESSION: 1. Retropharyngeal edema without visible pharyngeal or spinal
source, suggest enhanced cervical MRI and inflammatory labs.
2. Mild for age atherosclerosis.

## 2021-11-02 IMAGING — MR MR LUMBAR SPINE WO/W CM
10 of 17 series · 28 of 48 positions shown · IV contrast (gadavist)
Comparison: Retropharyngeal edema.

CLINICAL DATA: Retropharyngeal edema. Meningitis/CNS infection
suspected.

EXAM:
MRI CERVICAL, THORACIC AND LUMBAR SPINE WITHOUT AND WITH CONTRAST
TECHNIQUE: Multiplanar and multiecho pulse sequences of the cervical spine, to
include the craniocervical junction and cervicothoracic junction,
and thoracic and lumbar spine, were obtained without and with
intravenous contrast.
CONTRAST:  10mL GADAVIST GADOBUTROL 1 MMOL/ML IV SOLN

[Series 18: T1 · sagittal · 4.0mm · 1.15mm/px · 3 of 14 slices shown (1 of 6)]
[im 1/14]
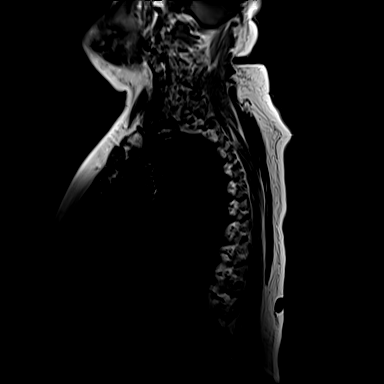
[im 7/14]
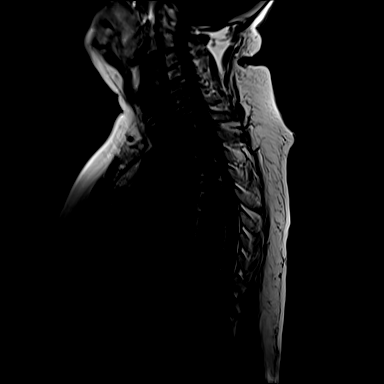
[im 14/14]
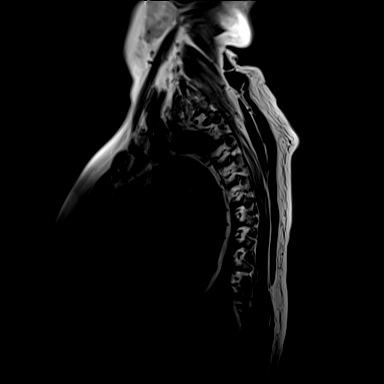

[Series 19: T1 · sagittal · 4.0mm · 1.00mm/px · 3 of 22 slices shown (2 of 6)]
[im 1/22]
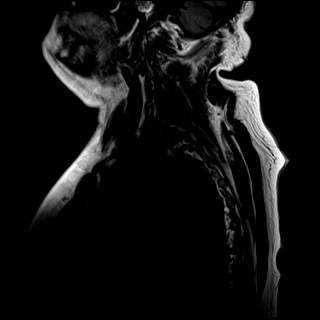
[im 11/22]
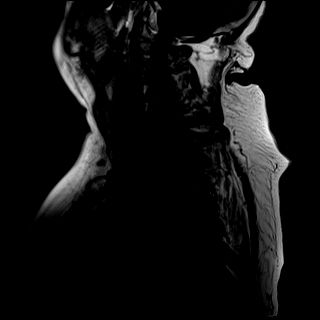
[im 22/22]
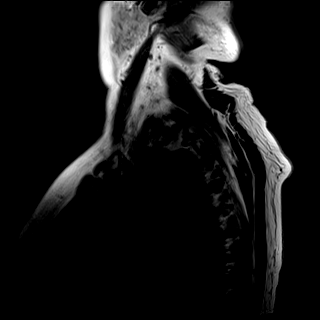

[Series 20: T1 · sagittal · 4.0mm · 1.19mm/px · 2 of 22 slices shown (3 of 6)]
[im 1/22]
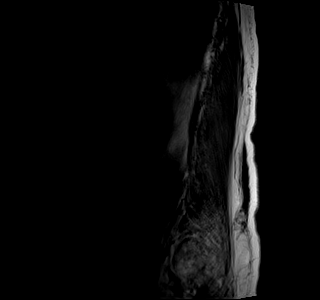
[im 22/22]
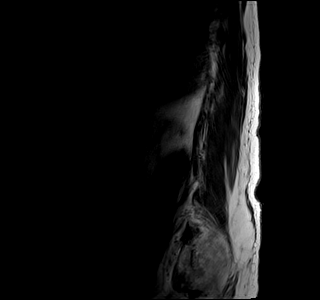

[Series 21: T1 · 2 of 21 slices shown (4 of 6)]
[im 1/21]
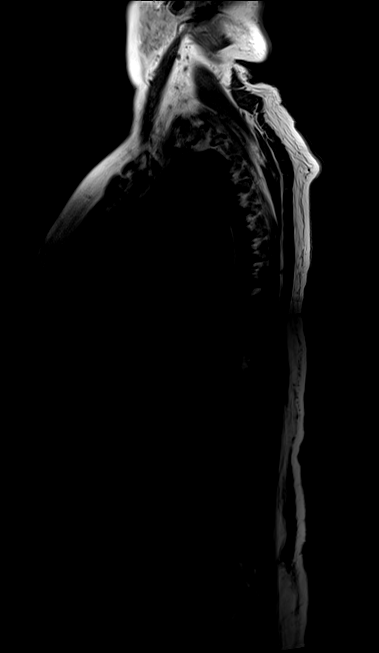
[im 21/21]
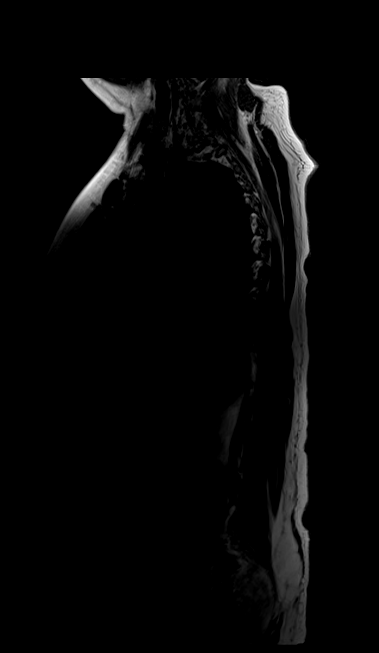

[Series 22: T1 · 2 of 21 slices shown (5 of 6)]
[im 1/21]
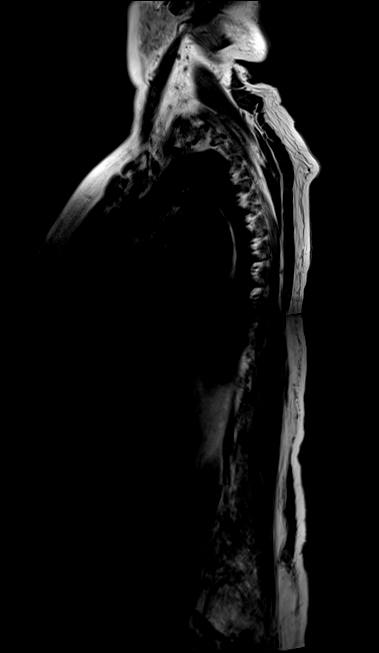
[im 21/21]
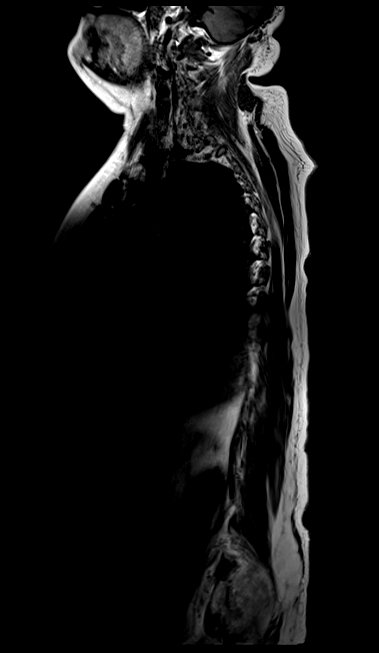

[Series 27: T2 · sagittal · 4.0mm · 1.00mm/px · 2 of 22 slices shown (1 of 4)]
[im 1/22]
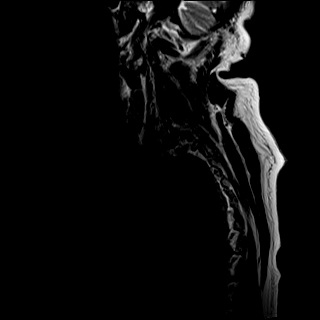
[im 22/22]
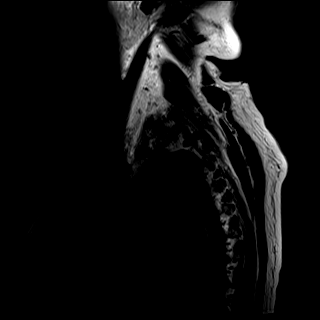

[Series 31: T2 · sagittal · 4.0mm · 1.00mm/px · 2 of 22 slices shown (2 of 4)]
[im 1/22]
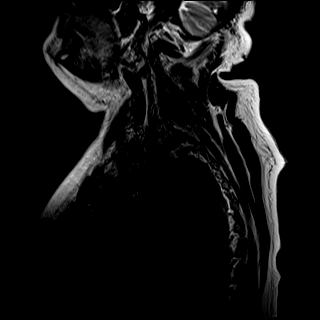
[im 22/22]
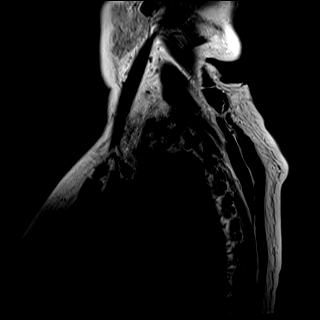

[Series 35: T2 · sagittal · 4.0mm · 0.83mm/px · 2 of 22 slices shown (3 of 4)]
[im 1/22]
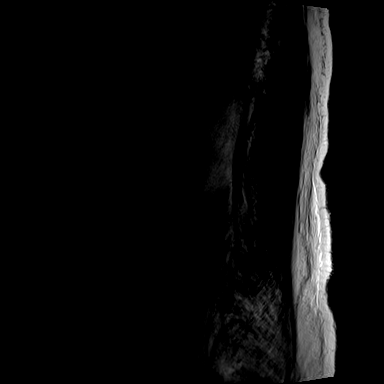
[im 22/22]
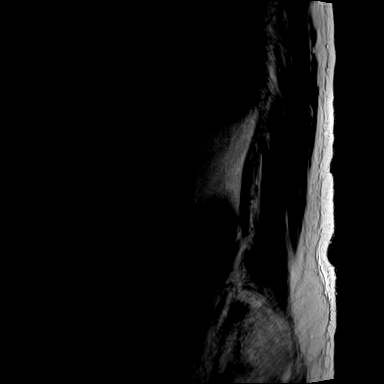

[Series 36: T2 · axial · 4.0mm · 0.78mm/px · z∈[-730,-478]mm · 6 of 54 slices shown (4 of 4)]
[im 1/54]
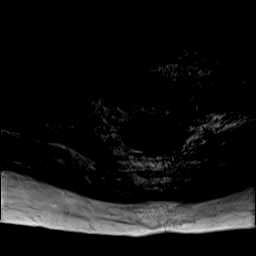
[im 11/54]
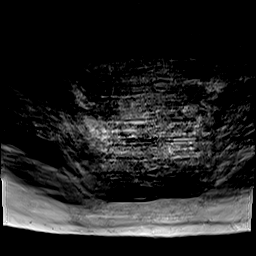
[im 22/54]
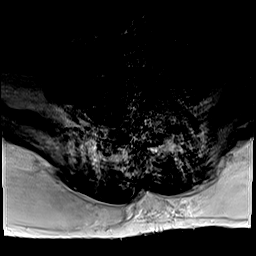
[im 32/54]
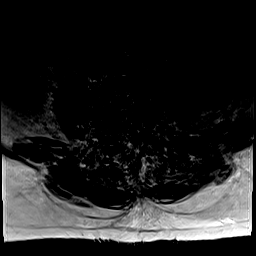
[im 43/54]
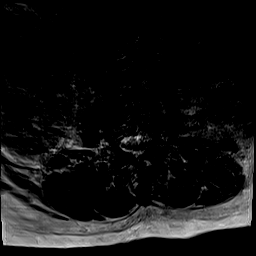
[im 54/54]
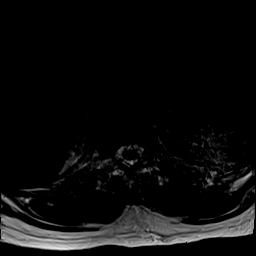

[Series 37: T1 · axial · 4.0mm · 0.39mm/px · z∈[-730,-587]mm · 4 of 54 slices shown (6 of 6)]
[im 1/54]
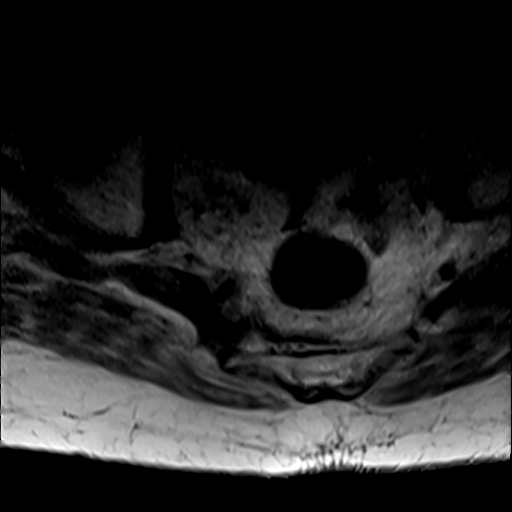
[im 11/54]
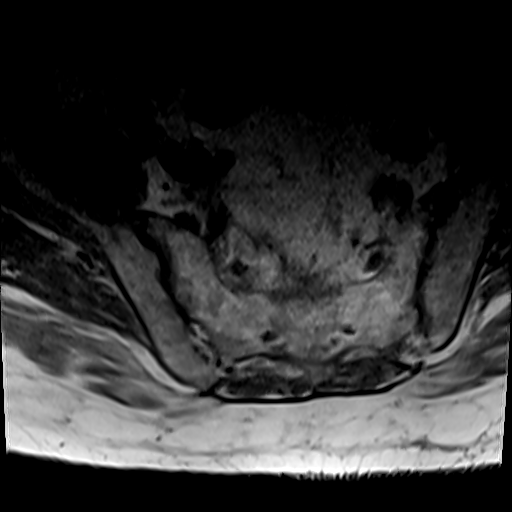
[im 22/54]
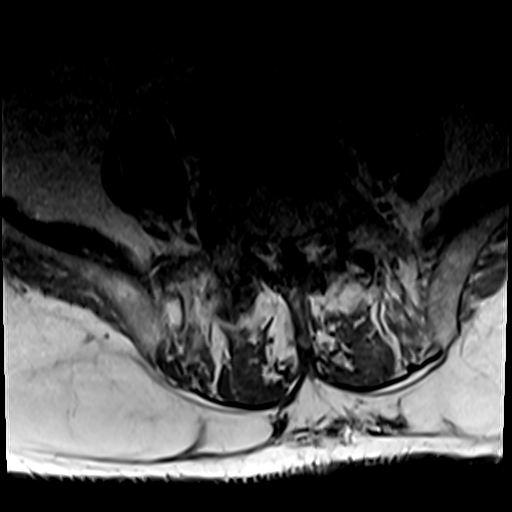
[im 32/54]
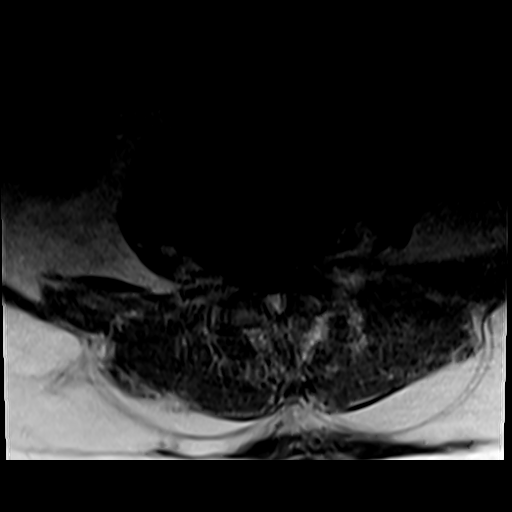

[28 of 48 positions shown; findings below may reference images not displayed]

FINDINGS: MRI CERVICAL SPINE FINDINGS

Alignment: No significant listhesis is present. Mild straightening
of the normal cervical lordosis is noted.

Vertebrae: Chronic fatty endplate marrow changes are noted from C3-4
through C7-T1. Vertebral body heights are maintained.

Cord: Normal signal and morphology.

Posterior Fossa, vertebral arteries, paraspinal tissues:

Prevertebral edema extends from the skull base through C5-6.
Peripheral postcontrast enhancement of the collection is noted.

Postcontrast images are moderately degraded by patient motion. No
definite intracanalicular enhancement is present in the cervical
spine.

Disc levels:

Axial images were not obtained in the cervical spine. Multilevel
disc disease is present in the cervical spine with effacement of the
ventral CSF at C3-4, C4-5, C5-6, and C6-7. Foraminal disease is
greatest at C5-6 on the left and C4-5 on the right.

MRI THORACIC SPINE FINDINGS

Alignment: No significant listhesis is present in the thoracic
spine. Straightening of the normal kyphosis is noted.

Vertebrae: Fatty endplate marrow changes present at T6-7. Marrow
signal and vertebral body heights normal.

Cord: Normal signal and morphology. No significant intracanalicular
enhancement is present in the thoracic spine.

Paraspinal and other soft tissues: Limited imaging the abdomen is
unremarkable. There is no significant adenopathy. No solid organ
lesions are present.

Disc levels:

Mild disc bulging is present at T5-6 and T6-7 without significant
stenosis. Mild foraminal narrowing is present at T7-8, T8-9 and
T9-10, right greater than left, secondary to facet spurring.

MRI LUMBAR SPINE FINDINGS

Segmentation: 5 non rib-bearing lumbar type vertebral bodies are
present. The lowest fully formed vertebral body is L5.

Alignment: No significant listhesis is present. Straightening of the
normal lumbar lordosis is noted.

Vertebrae: Study is moderately degraded by patient motion. Chronic
fatty endplate marrow changes are noted anteriorly at L3-4.
Edematous changes are present anteriorly at L4-5 with some
enhancement of the endplates and fluid in the disc space at L4-5.

Conus medullaris and cauda equina: Conus extends to the L1 level.
Conus and cauda equina appear normal.

Paraspinal and other soft tissues: Limited imaging the abdomen is
unremarkable. There is no significant adenopathy. No solid organ
lesions are present.

Disc levels:

T12-L1: Negative.

L1-2: Negative.

L2-3: Mild epidural enhancement is noted, anteriorly more
posteriorly. Broad-based disc protrusion extends into the foramina
bilaterally with moderate foraminal narrowing bilaterally.

L3-4: Broad-based disc protrusion is present. Moderate facet
hypertrophy is noted bilaterally. Moderate central bilateral
foraminal stenosis is present. Diffuse epidural enhancement is
present. No discrete epidural abscess is present.

L4-5: Fluid is present in the disc space. Enhancement is present
disc space and endplates. Extensive epidural enhancement is present.
Central and foraminal stenosis present.

L5-S1: Loss of disc height is present. Diffuse epidural enhancement
present. Central disc protrusion contributes to mild central canal
stenosis. Facet spurring contributes to mild foraminal narrowing
bilaterally.
IMPRESSION: 1. Fluid and enhancement within the disc space at L4-5 with adjacent
edema and enhancement of the endplates. Findings are consistent with
discitis-osteomyelitis.
2. Extensive epidural enhancement in the lower lumbar spine L2-3
through the sacrum compatible with infection. Recommend lumbar
puncture further evaluation.
3. Prevertebral edema with peripheral postcontrast enhancement
extending from the skull base through C5-6. Focal source for
infection in the upper cervical spine not identified.
4. The study is moderately degraded by patient motion.
5. Multilevel spondylosis of the cervical spine as described.
6. Diffuse epidural enhancement at L2-3, L3-4, L4-5, and L5-S1
without discrete epidural abscess.
7. Moderate central canal and bilateral foraminal stenosis at L3-4.
8. Mild foraminal narrowing bilaterally at T7-8, T8-9 and T9-10
secondary to facet spurring.

These results will be called to the ordering clinician or
representative by the Radiologist Assistant, and communication
documented in the PACS or [REDACTED].

## 2021-11-02 IMAGING — CT CT ANGIO HEAD
1 of 10 series · 6 of 33 positions shown · IV contrast (omnipaque)
Comparison: None.

CLINICAL DATA: Headache with severe neck pain.



[Series 11: ax thin · axial · 0.46mm/px · z∈[-225,+16]mm · 6 of 338 slices shown]
[im 49/338  soft-tissue]
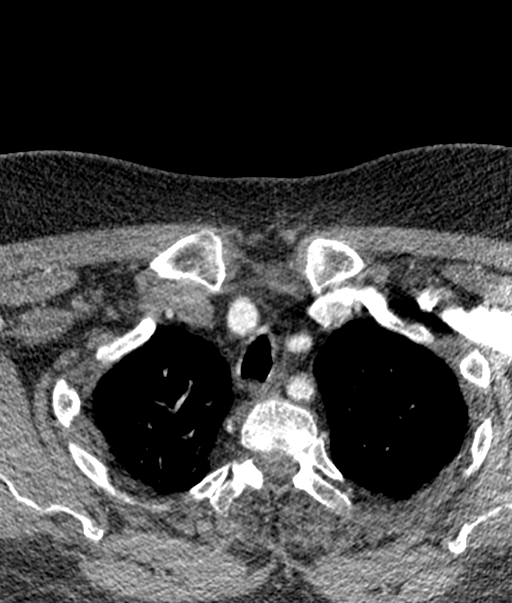
[im 97/338  bone]
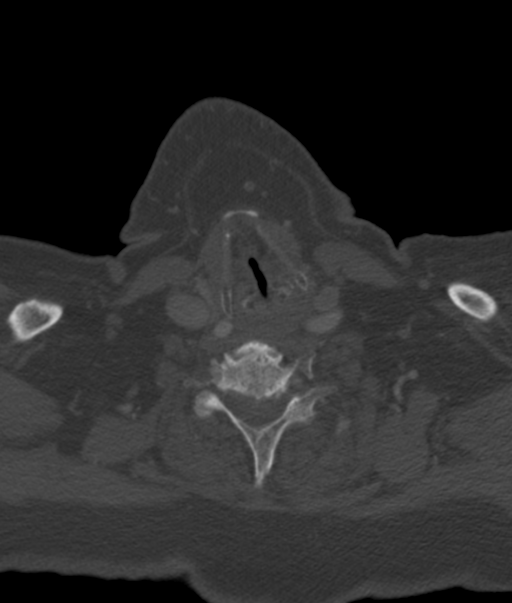
[im 145/338  soft-tissue]
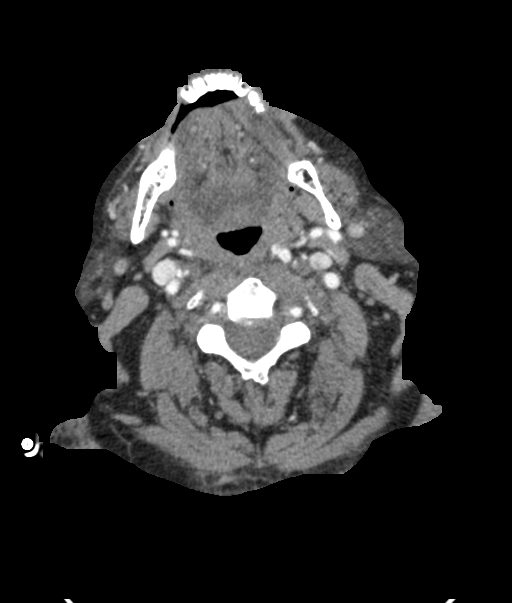
[im 193/338  bone]
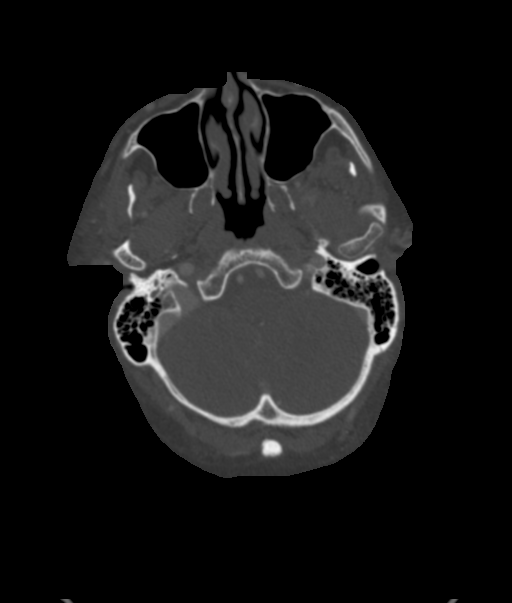
[im 241/338  soft-tissue]
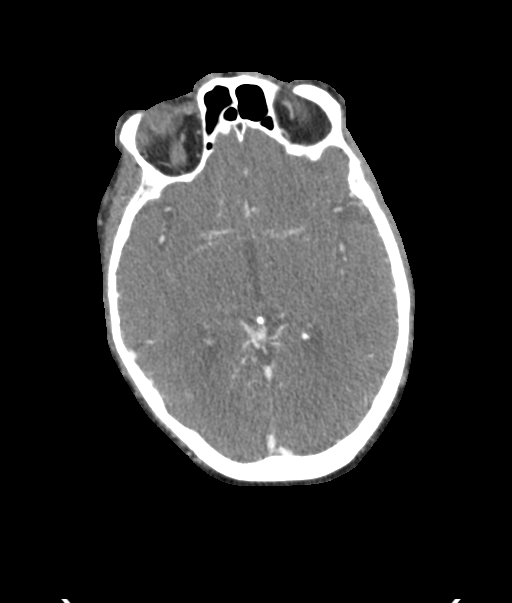
[im 289/338  bone]
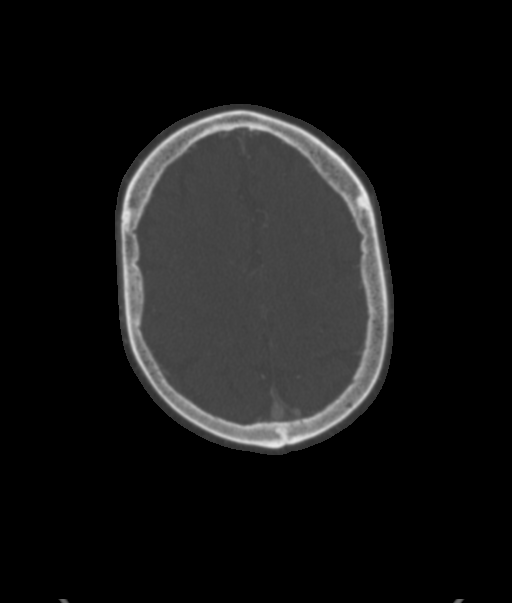

[6 of 33 positions shown; findings below may reference images not displayed]

FINDINGS: CT HEAD FINDINGS

Brain: No evidence of acute infarction, hemorrhage, hydrocephalus,
extra-axial collection or mass lesion/mass effect. Mild chronic
small vessel disease in the hemispheric white matter. Chronic lacune
at the right caudate head.

Vascular: See below

Skull: Negative

Sinuses: Negative

Orbits: Negative

Review of the MIP images confirms the above findings

CTA NECK FINDINGS

Aortic arch: Normal

Right carotid system: Vessels are smooth and widely patent

Left carotid system: Vessels are smooth and widely patent. Minimal
atheromatous changes.

Vertebral arteries: No proximal subclavian stenosis. Both vertebral
arteries are smoothly contoured and widely patent.

Skeleton: Subtle but convincing retropharyngeal/prevertebral fat
haziness. No associated muscular calcification or focal endplate
erosion. The cervical spine is diffusely degenerated. No evidence of
mucosal inflammation the level of the throat.

Other neck: As above

Upper chest: Negative

Review of the MIP images confirms the above findings

CTA HEAD FINDINGS

Anterior circulation: No significant stenosis, proximal occlusion,
aneurysm, or vascular malformation.

Posterior circulation: Vessels are smooth and diffusely patent.
Negative for aneurysm or vascular malformation.

Venous sinuses: Unremarkable

Anatomic variants: No significant variant.

Review of the MIP images confirms the above findings
IMPRESSION: 1. Retropharyngeal edema without visible pharyngeal or spinal
source, suggest enhanced cervical MRI and inflammatory labs.
2. Mild for age atherosclerosis.

## 2021-11-02 MED ORDER — SIMVASTATIN 40 MG PO TABS: 40.0000 mg | ORAL_TABLET | Freq: Every day | ORAL | Status: DC

## 2021-11-02 MED ORDER — LACTATED RINGERS IV BOLUS (SEPSIS)
1000.0000 mL | Freq: Once | INTRAVENOUS | Status: AC
  Administered 2021-11-02: 1000 mL via INTRAVENOUS

## 2021-11-02 MED ORDER — SODIUM CHLORIDE 0.9 % IV SOLN
2.0000 g | INTRAVENOUS | Status: DC
  Administered 2021-11-03 (×2): 2 g via INTRAVENOUS
  Filled 2021-11-02 (×4): qty 2000

## 2021-11-02 MED ORDER — VANCOMYCIN HCL 1000 MG/200ML IV SOLN
1000.0000 mg | Freq: Two times a day (BID) | INTRAVENOUS | Status: DC
Start: 2021-11-02 — End: 2021-11-06
  Administered 2021-11-02 – 2021-11-06 (×8): 1000 mg via INTRAVENOUS
  Filled 2021-11-02 (×8): qty 200

## 2021-11-02 MED ORDER — LOSARTAN POTASSIUM 50 MG PO TABS
50.0000 mg | ORAL_TABLET | Freq: Every day | ORAL | Status: DC
  Administered 2021-11-03 – 2021-11-19 (×17): 50 mg via ORAL
  Filled 2021-11-02 (×17): qty 1

## 2021-11-02 MED ORDER — SODIUM CHLORIDE 0.9% FLUSH
3.0000 mL | Freq: Two times a day (BID) | INTRAVENOUS | Status: DC
  Administered 2021-11-02 – 2021-11-24 (×17): 3 mL via INTRAVENOUS

## 2021-11-02 MED ORDER — MORPHINE SULFATE (PF) 2 MG/ML IV SOLN
2.0000 mg | INTRAVENOUS | Status: DC | PRN
  Administered 2021-11-02 – 2021-11-09 (×33): 2 mg via INTRAVENOUS
  Filled 2021-11-02 (×34): qty 1

## 2021-11-02 MED ORDER — SENNOSIDES-DOCUSATE SODIUM 8.6-50 MG PO TABS
1.0000 | ORAL_TABLET | Freq: Every evening | ORAL | Status: DC | PRN
  Administered 2021-11-14: 1 via ORAL
  Filled 2021-11-02: qty 1

## 2021-11-02 MED ORDER — METRONIDAZOLE 500 MG/100ML IV SOLN
500.0000 mg | Freq: Once | INTRAVENOUS | Status: AC
  Administered 2021-11-02: 500 mg via INTRAVENOUS
  Filled 2021-11-02: qty 100

## 2021-11-02 MED ORDER — ONDANSETRON HCL 4 MG PO TABS
4.0000 mg | ORAL_TABLET | Freq: Four times a day (QID) | ORAL | Status: DC | PRN
  Administered 2021-11-21: 4 mg via ORAL
  Filled 2021-11-02: qty 1

## 2021-11-02 MED ORDER — AMLODIPINE BESYLATE 5 MG PO TABS
5.0000 mg | ORAL_TABLET | Freq: Every day | ORAL | Status: DC
  Administered 2021-11-03 – 2021-11-08 (×6): 5 mg via ORAL
  Filled 2021-11-02 (×7): qty 1

## 2021-11-02 MED ORDER — METRONIDAZOLE 500 MG/100ML IV SOLN: 500.0000 mg | Freq: Two times a day (BID) | INTRAVENOUS | Status: DC

## 2021-11-02 MED ORDER — METOCLOPRAMIDE HCL 5 MG/ML IJ SOLN
10.0000 mg | Freq: Once | INTRAMUSCULAR | Status: AC
  Administered 2021-11-02: 10 mg via INTRAVENOUS
  Filled 2021-11-02: qty 2

## 2021-11-02 MED ORDER — NALOXONE HCL 0.4 MG/ML IJ SOLN: 0.4000 mg | INTRAMUSCULAR | Status: DC | PRN

## 2021-11-02 MED ORDER — ONDANSETRON HCL 4 MG/2ML IJ SOLN: 4.0000 mg | Freq: Four times a day (QID) | INTRAMUSCULAR | Status: DC | PRN

## 2021-11-02 MED ORDER — ALBUTEROL SULFATE (2.5 MG/3ML) 0.083% IN NEBU
2.5000 mg | INHALATION_SOLUTION | Freq: Once | RESPIRATORY_TRACT | Status: AC
  Administered 2021-11-02: 2.5 mg via RESPIRATORY_TRACT
  Filled 2021-11-02: qty 3

## 2021-11-02 MED ORDER — MOMETASONE FURO-FORMOTEROL FUM 200-5 MCG/ACT IN AERO
2.0000 | INHALATION_SPRAY | Freq: Two times a day (BID) | RESPIRATORY_TRACT | Status: DC
  Administered 2021-11-03 – 2021-11-24 (×43): 2 via RESPIRATORY_TRACT
  Filled 2021-11-02 (×2): qty 8.8

## 2021-11-02 MED ORDER — ALBUTEROL SULFATE (2.5 MG/3ML) 0.083% IN NEBU: 2.5000 mg | INHALATION_SOLUTION | RESPIRATORY_TRACT | Status: DC | PRN

## 2021-11-02 MED ORDER — MORPHINE SULFATE (PF) 2 MG/ML IV SOLN
2.0000 mg | Freq: Once | INTRAVENOUS | Status: AC
  Administered 2021-11-02: 2 mg via INTRAVENOUS
  Filled 2021-11-02: qty 1

## 2021-11-02 MED ORDER — LORAZEPAM 2 MG/ML IJ SOLN
1.0000 mg | Freq: Once | INTRAMUSCULAR | Status: AC | PRN
  Administered 2021-11-02: 1 mg via INTRAVENOUS
  Filled 2021-11-02: qty 1

## 2021-11-02 MED ORDER — ATORVASTATIN CALCIUM 20 MG PO TABS
20.0000 mg | ORAL_TABLET | Freq: Every day | ORAL | Status: DC
Start: 2021-11-03 — End: 2021-11-09
  Administered 2021-11-03 – 2021-11-09 (×7): 20 mg via ORAL
  Filled 2021-11-02 (×7): qty 1

## 2021-11-02 MED ORDER — LACTATED RINGERS IV SOLN: INTRAVENOUS | Status: AC

## 2021-11-02 MED ORDER — DEXTROSE 5 % IV SOLN
10.0000 mg/kg | Freq: Three times a day (TID) | INTRAVENOUS | Status: DC
  Administered 2021-11-03: 775 mg via INTRAVENOUS
  Filled 2021-11-02 (×2): qty 15.5

## 2021-11-02 MED ORDER — DIPHENHYDRAMINE HCL 50 MG/ML IJ SOLN
50.0000 mg | Freq: Once | INTRAMUSCULAR | Status: AC
  Administered 2021-11-02: 50 mg via INTRAVENOUS
  Filled 2021-11-02: qty 1

## 2021-11-02 MED ORDER — METHOCARBAMOL 500 MG PO TABS
500.0000 mg | ORAL_TABLET | Freq: Once | ORAL | Status: AC
  Administered 2021-11-02: 500 mg via ORAL
  Filled 2021-11-02: qty 1

## 2021-11-02 MED ORDER — OXYCODONE-ACETAMINOPHEN 5-325 MG PO TABS
1.0000 | ORAL_TABLET | Freq: Once | ORAL | Status: AC
  Administered 2021-11-02: 1 via ORAL
  Filled 2021-11-02: qty 1

## 2021-11-02 MED ORDER — VANCOMYCIN HCL IN DEXTROSE 1-5 GM/200ML-% IV SOLN
1000.0000 mg | Freq: Once | INTRAVENOUS | Status: AC
  Administered 2021-11-02: 1000 mg via INTRAVENOUS
  Filled 2021-11-02: qty 200

## 2021-11-02 MED ORDER — ACETAMINOPHEN 650 MG RE SUPP: 650.0000 mg | Freq: Four times a day (QID) | RECTAL | Status: DC | PRN

## 2021-11-02 MED ORDER — LACTATED RINGERS IV SOLN: INTRAVENOUS | Status: DC

## 2021-11-02 MED ORDER — MORPHINE SULFATE (PF) 4 MG/ML IV SOLN
4.0000 mg | Freq: Once | INTRAVENOUS | Status: AC
  Administered 2021-11-02: 4 mg via INTRAVENOUS
  Filled 2021-11-02: qty 1

## 2021-11-02 MED ORDER — SODIUM CHLORIDE 0.9 % IV SOLN
2.0000 g | Freq: Once | INTRAVENOUS | Status: AC
  Administered 2021-11-02: 2 g via INTRAVENOUS
  Filled 2021-11-02: qty 2

## 2021-11-02 MED ORDER — LORAZEPAM 2 MG/ML IJ SOLN: 2.0000 mg | Freq: Once | INTRAMUSCULAR | Status: DC | PRN

## 2021-11-02 MED ORDER — MORPHINE SULFATE (PF) 4 MG/ML IV SOLN: 4.0000 mg | Freq: Once | INTRAVENOUS | Status: DC

## 2021-11-02 MED ORDER — LACTATED RINGERS IV BOLUS
1000.0000 mL | Freq: Once | INTRAVENOUS | Status: AC
  Administered 2021-11-02: 1000 mL via INTRAVENOUS

## 2021-11-02 MED ORDER — ACETAMINOPHEN 325 MG PO TABS
650.0000 mg | ORAL_TABLET | Freq: Four times a day (QID) | ORAL | Status: DC | PRN
  Administered 2021-11-07 – 2021-11-25 (×5): 650 mg via ORAL
  Filled 2021-11-02 (×5): qty 2

## 2021-11-02 MED ORDER — MORPHINE SULFATE (PF) 2 MG/ML IV SOLN
2.0000 mg | INTRAVENOUS | Status: DC | PRN
  Administered 2021-11-02: 2 mg via INTRAVENOUS
  Filled 2021-11-02: qty 1

## 2021-11-02 MED ORDER — ACETAMINOPHEN 650 MG RE SUPP
650.0000 mg | Freq: Once | RECTAL | Status: AC
  Administered 2021-11-02: 650 mg via RECTAL
  Filled 2021-11-02: qty 1

## 2021-11-02 MED ORDER — SODIUM CHLORIDE 0.9 % IV SOLN
2.0000 g | Freq: Three times a day (TID) | INTRAVENOUS | Status: DC
  Administered 2021-11-03: 2 g via INTRAVENOUS
  Filled 2021-11-02 (×2): qty 2

## 2021-11-02 MED ORDER — DEXAMETHASONE SODIUM PHOSPHATE 10 MG/ML IJ SOLN
8.0000 mg | Freq: Three times a day (TID) | INTRAMUSCULAR | Status: AC
  Administered 2021-11-02 – 2021-11-03 (×3): 8 mg via INTRAVENOUS
  Filled 2021-11-02 (×3): qty 1

## 2021-11-02 MED ORDER — IOHEXOL 350 MG/ML SOLN
80.0000 mL | Freq: Once | INTRAVENOUS | Status: AC | PRN
  Administered 2021-11-02: 80 mL via INTRAVENOUS

## 2021-11-02 NOTE — Progress Notes (Signed)
Pharmacy Antibiotic Note  Charles Weiss Charles Weiss. is a 70 y.o. male admitted on 11/02/2021 with sepsis.  Pharmacy has been consulted for vanc/cefepime dosing.  Plan: Vanc 1g x 1 in ER then start 1g IV q12 thereafter - goal AUC 400-550. Note will start dosing now to in escence give 2g loading dose Cefepime 2g IV q8 per current renal function  Height: 6' (182.9 cm) Weight: 113.4 kg (250 lb) IBW/kg (Calculated) : 77.6  Temp (24hrs), Avg:100.3 F (37.9 C), Min:98.2 F (36.8 C), Max:103.4 F (39.7 C)  Recent Labs  Lab 11/02/21 0859 11/02/21 1028 11/02/21 1632  WBC 19.4*  --   --   CREATININE  --  0.92  --   LATICACIDVEN  --   --  1.2    Estimated Creatinine Clearance: 97.1 mL/min (by C-G formula based on SCr of 0.92 mg/dL).    Allergies  Allergen Reactions   Elavil [Amitriptyline] Other (See Comments)   Tetracyclines & Related Other (See Comments)     Thank you for allowing pharmacy to be a part of this patient's care.  Kara Mead 11/02/2021 8:56 PM

## 2021-11-02 NOTE — Progress Notes (Signed)
Pharmacy Antibiotic Note  Charles Weiss Joann Kulpa. is a 70 y.o. male admitted on 11/02/2021 with sepsis.  Pharmacy has been consulted for vanc/cefepime dosing. Now requesting to add ampicillin and acyclovir for empiric meningitis coverage Currently on LR at 162ml/hr; CrCl>38ml/min  Plan: Continue Vanc 1g IV q12h to target goal AUC 400-550.  Continue Cefepime 2g IV q8  Add Ampicillin 2gm IV q4h Add Acyclovir 10mg /kg IV q8h Monitor renal function and cx data    Height: 6' (182.9 cm) Weight: 113.4 kg (250 lb) IBW/kg (Calculated) : 77.6  Temp (24hrs), Avg:99.9 F (37.7 C), Min:97.9 F (36.6 C), Max:103.4 F (39.7 C)  Recent Labs  Lab 11/02/21 0859 11/02/21 1028 11/02/21 1632  WBC 19.4*  --   --   CREATININE  --  0.92  --   LATICACIDVEN  --   --  1.2     Estimated Creatinine Clearance: 97.1 mL/min (by C-G formula based on SCr of 0.92 mg/dL).    Allergies  Allergen Reactions   Elavil [Amitriptyline] Other (See Comments)   Tetracyclines & Related Other (See Comments)     Thank you for allowing pharmacy to be a part of this patient's care.  Netta Cedars PharmD 11/02/2021 10:59 PM

## 2021-11-02 NOTE — ED Provider Notes (Signed)
Assumed care at change of shift, see prior note for complete H&P. Briefly, patient is 70 yo here with neck pain x 5 days, constant with headache (occipital to forehead). Admitted just prior to onset of symptoms for hypoxia and flu.  CTA showed retropharyngeal edema without source, recommending MRI and inflammatory markers.  Epidural abscess vs discitis.    Physical Exam  BP (!) 127/94   Pulse (!) 110   Temp (!) 103.4 F (39.7 C) (Rectal)   Resp (!) 22   Ht 6' (1.829 m)   Wt 113.4 kg   SpO2 94%   BMI 33.91 kg/m   Physical Exam  ED Course/Procedures     .Critical Care Performed by: Tacy Learn, PA-C Authorized by: Tacy Learn, PA-C   Critical care provider statement:    Critical care time (minutes):  30   Critical care was time spent personally by me on the following activities:  Development of treatment plan with patient or surrogate, discussions with consultants, evaluation of patient's response to treatment, examination of patient, ordering and review of laboratory studies, ordering and review of radiographic studies, ordering and performing treatments and interventions, pulse oximetry, re-evaluation of patient's condition, review of old charts and obtaining history from patient or surrogate   Care discussed with: admitting provider    MDM  Patient is tachycardic with normal oral temperature, mildly tachypneic.  Requested rectal temperature which reveals a 103.4 fever.  Patient received oxycodone containing 300.5 mg of Tylenol 2 hours ago, will request 650 mg PR Tylenol.  Undifferentiated sepsis order sets added to current work-up along with your bolus of IV fluids.  Unfortunately, patient was given Ativan prior to receiving his MRI screening is unable to adequately answer questions per MRI.  Request MRI completed today for this patient.  Urinalysis returns positive for nitrites.  Patient on broad-spectrum antibiotics for his fever unknown origin/code sepsis  (tachycardic, tachypneic, febrile with white count of 19,000).  Lactic acid reassuring at 1.2.  Pending further work up.  Patient unable to tolerate MRI, is returned to the department with out imaging completed. Discussed with Dr. Eulis Foster, ER attending, recommends coverage for discitis and consult for admission.   Discussed available results with patient and plan of care. Patient states he has had dysuria for the past few days and has noticed some blood in his urine. States he has had kidney stones in the past but this does not feel similar to prior kidney stones.   Case discussed with Dr. Posey Pronto with [facility] service. Results reviewed, recommends call to ENT to discuss concerns for abscess.  Discussed with Dr. Fredric Dine, ENT, images were reviewed, edema without a drainable collection present.  Recommends 8 mg of Decadron every 8 hours x 3 doses as well as broad-spectrum antibiotics.  Recommend conservative management over the next 24 to 48 hours, reimage if symptoms worsen.  No source for infection seen on images, consider dental source.  Discussed with patient, he reports he does not have any teeth and is not having any pain in his mouth.  Contacted Dr. Posey Pronto who will admit.   Tacy Learn, PA-C 11/02/21 Hilarie Fredrickson, MD 11/03/21 1239

## 2021-11-02 NOTE — Progress Notes (Signed)
A consult was received from an ED physician for vancomycin and cefepime per pharmacy dosing (for an indication other than meningitis). The patient's profile has been reviewed for ht/wt/allergies/indication/available labs. A one time order has been placed for the above antibiotics.  Further antibiotics/pharmacy consults should be ordered by admitting physician if indicated.                       Reuel Boom, PharmD, BCPS [phone] 11/02/2021, 4:54 PM

## 2021-11-02 NOTE — ED Notes (Signed)
Pt transported to MRI 

## 2021-11-02 NOTE — ED Triage Notes (Signed)
Pt BIB EMS from home. Pt reports with neck pain and headache x 3 days. Pt was dx with the Flu on Tuesday.

## 2021-11-02 NOTE — ED Notes (Signed)
Pt sating 88% on room air. RN placed pt on 2LNC. O2 increased to 93%.

## 2021-11-02 NOTE — Sepsis Progress Note (Signed)
Dr. Posey Pronto made aware/confirmed the 2nd Lactic was D/C, continue to monitor from [place] standpoint for sepsis

## 2021-11-02 NOTE — Sepsis Progress Note (Signed)
Elink is following code sepsis 

## 2021-11-02 NOTE — H&P (Signed)
History and Physical    Charles Weiss. YOC:[id number] DOB: [birth year] DOA: 11/02/2021  PCP: System, Provider Not In  Patient coming from: Home via EMS  I have personally briefly reviewed patient's old medical records in [facility]  Chief Complaint: Neck pain, headache  HPI: Charles Weiss. is a 70 y.o. male with medical history significant for asthma, hypertension, hyperlipidemia, recent admission for acute hypoxic respiratory failure due to influenza pneumonia who presents to the ED for evaluation of neck pain and headache.  Patient recently admitted on 10/24/2021-10/28/2021 for acute hypoxic respiratory failure secondary to influenza pneumonia and asthma exacerbation.  Patient states that since he has been home he has been feeling unwell.  He has been having posterior headaches and neck pain since day of discharge.  Symptoms have been constant and progressively worse, more noticeable with movement and positional change.  He has had some shortness of breath and nonproductive cough.  He has had some chills and diaphoresis.  He denies any chest pain, nausea, vomiting, abdominal pain.  He does report dysuria.  He is also having back pain.  ED Course:  Initial vitals showed BP 143/94, pulse 96, RR 18, temp 98.2 F, SPO2 95% on room air.  While in the ED patient was febrile with T-max 103.4 F rectally.  He has been tachycardic with pulse 110s, tachypneic with RR high 20s.  SPO2 was 88% on room air and he was placed on 2 L O2 via [place] with improvement >94%.  Labs significant for WBC 19.4, hemoglobin 13.1, platelets 292,000, sodium 132, potassium 3.7, bicarb 27, BUN 29, creatinine 0.92, serum glucose 153, ESR 95, CRP 33.5, lactic acid 1.2.  INR 1.3.  Blood cultures collected and in process.  Urinalysis shows positive nitrates, negative leukocytes, 21-50 RBC/hpf, 6-10 WBC/hpf, rare bacteria microscopy.  Urine culture obtained and in process.  Portable chest x-ray shows  slightly decreased bilateral patchy airspace disease compared to prior exam.  CTA head/neck shows retropharyngeal edema without visible pharyngeal or spinal source.  MRI cervical, thoracic, lumbar spine ordered but were not able to be completed.  Patient was given several doses IV morphine, IV Ativan 1 mg, IV vancomycin, cefepime, Flagyl, 2 L LR followed by maintenance fluids, IV Reglan, albuterol nebulizer.  EDP discussed with on-call ENT, Dr. Marene Lenz, who reviewed the images and felt there was edema without a drainable collection present.  Recommendation was for 8 mg of Decadron every 8 hours x 3 doses, broad-spectrum antibiotics, conservative management over first 24-48 hours, consider repeat imaging symptoms worsen or as needed.  The hospitalist service was consulted to admit for further evaluation and management.  Review of Systems: All systems reviewed and are negative except as documented in history of present illness above.   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 No pertinent surgical history.  Social History:  reports that he has been smoking cigarettes. He has never used smokeless tobacco. He reports that he does not drink alcohol and does not use drugs.  Allergies  Allergen Reactions   Elavil [Amitriptyline] Other (See Comments)   Tetracyclines & Related Other (See Comments)    Family History  Problem Relation Age of Onset  Hypertension Mother    Skin cancer Mother    Osteoporosis Mother    Hypertension Father      Prior to Admission medications   Medication Sig Start Date End Date Taking? Authorizing Provider  acetaminophen (TYLENOL) 650 MG CR tablet Take 650 mg by mouth every 8 (eight) hours as needed for pain.    [provider]  albuterol (PROVENTIL) (2.5 MG/3ML) 0.083% nebulizer solution Take 3 mLs by nebulization 3 (three) times daily. 10/28/21   Lavina Hamman, MD  amLODipine (NORVASC) 5 MG tablet Take 5 mg by mouth daily.    [provider]  aspirin 81 MG chewable tablet Chew by mouth daily.    [provider]  benzonatate (TESSALON) 100 MG capsule Take 1 capsule (100 mg total) by mouth 3 (three) times daily. 10/28/21   Lavina Hamman, MD  chlorpheniramine-HYDROcodone (TUSSIONEX) 10-8 MG/5ML SUER Take 5 mLs by mouth every 12 (twelve) hours as needed for cough. 10/28/21   Lavina Hamman, MD  Cholecalciferol (VITAMIN D3) 25 MCG (1000 UT) CAPS Take 1,000 Units by mouth daily.    [provider]  Cyanocobalamin (B-12 PO) Take 1 tablet by mouth daily.    [provider]  dextromethorphan (DELSYM) 30 MG/5ML liquid Take 5 mLs (30 mg total) by mouth 2 (two) times daily as needed for cough. 10/28/21   Lavina Hamman, MD  dextromethorphan-guaiFENesin [facility] DM) 30-600 MG 12hr tablet Take 1 tablet by mouth 2 (two) times daily. 10/28/21   Lavina Hamman, MD  docusate sodium (COLACE) 100 MG capsule Take 1 capsule (100 mg total) by mouth 2 (two) times daily. 10/28/21 10/28/22  Lavina Hamman, MD  gabapentin (NEURONTIN) 300 MG capsule Take 1 tablet (300 mg) in the morning and 2 tablets (600 mg) before bed. Patient taking differently: Take 300-600 mg by mouth See admin instructions. Take 300 mg in the morning and afternoon and 600 mg before bed. 08/01/21   Raspet, Erin K, PA-C  ketotifen (ZADITOR) 0.025 % ophthalmic solution Place 1 drop into both eyes 2 (two) times daily as needed (allergies).    [provider]  losartan (COZAAR) 50 MG tablet Take 50 mg by mouth daily.    [provider]  omeprazole (PRILOSEC) 20 MG capsule Take 20 mg by mouth daily.    [provider]  polyethylene glycol (MIRALAX / GLYCOLAX) 17 g packet Take 17 g by mouth daily. 10/29/21    Lavina Hamman, MD  predniSONE (DELTASONE) 10 MG tablet Take $RemoveBef'50mg'PgEGYXiHDM$  daily for 3days,Take $RemoveBefore'40mg'epjpvBiIKTmGb$  daily for 3days,Take $RemoveBefore'30mg'pADeyeSAplHaT$  daily for 3days,Take $RemoveBefore'20mg'bQDeFoTjELlqv$  daily for 3days,Take $RemoveBefore'10mg'PnhmkaJEjgcaj$  daily for 3days, then stop 10/28/21   Lavina Hamman, MD  Pyridoxine HCl (B-6 PO) Take 1 tablet by mouth daily.    [provider]  simvastatin (ZOCOR) 40 MG tablet Take 40 mg by mouth daily.    [provider]  tizanidine (ZANAFLEX) 2 MG capsule Take 1 capsule (2 mg total) by mouth at bedtime as needed for muscle spasms. Patient not taking: Reported on 10/24/2021 08/01/21   Raspet, Erin K, PA-C  WIXELA INHUB 250-50 MCG/ACT AEPB Inhale 1 puff into the lungs 2 (two) times daily. 10/09/21   [provider]    Physical Exam: Vitals:   11/02/21 1815 11/02/21 1901 11/02/21 1902 11/02/21 2101  BP: (!) 127/94 (!) 146/85  128/78  Pulse: (!) 110 (!) 114  (!) 108  Resp: (!) 22   18  Temp:  (!) 100.5 F (  38.1 C) (!) 100.5 F (38.1 C) 97.9 F (36.6 C)  TempSrc:  Rectal Rectal Oral  SpO2: 94% 96%  93%  Weight:      Height:       Constitutional: Resting supine in bed, diaphoretic, appears fatigued and somewhat uncomfortable Eyes: PERRL, lids and conjunctivae normal ENMT: Mucous membranes are dry. Posterior pharynx clear of any exudate or lesions. Edentulous. Neck: normal, supple, no masses. Respiratory: Bilateral expiratory wheezing. Normal respiratory effort while on 2 L O2 via [place]. No accessory muscle use.  Cardiovascular: Regular rate and rhythm, no murmurs / rubs / gallops. No extremity edema. 2+ pedal pulses. Abdomen: no tenderness, no masses palpated. No hepatosplenomegaly. Bowel sounds positive.  Musculoskeletal: no clubbing / cyanosis. No joint deformity upper and lower extremities.  Pain limiting ROM at neck otherwise good ROM of extremities, no contractures. Normal muscle tone.  Skin: no rashes, lesions, ulcers. No induration Neurologic: CN 2-12 grossly intact. Sensation intact. Strength 5/5 in  all 4 although pain elicited at low back/left hip when moving LLE against resistance.  Psychiatric: Normal judgment and insight. Alert and oriented x 3. Normal mood.   Labs on Admission: I have personally reviewed following labs and imaging studies  CBC: Recent Labs  Lab 11/02/21 0859  WBC 19.4*  NEUTROABS 17.4*  HGB 13.1  HCT 37.4*  MCV 89.3  PLT 277   Basic Metabolic Panel: Recent Labs  Lab 11/02/21 1028  NA 132*  K 3.7  CL 95*  CO2 27  GLUCOSE 153*  BUN 29*  CREATININE 0.92  CALCIUM 8.9   GFR: Estimated Creatinine Clearance: 97.1 mL/min (by C-G formula based on SCr of 0.92 mg/dL). Liver Function Tests: Recent Labs  Lab 11/02/21 1028  AST 20  ALT 34  ALKPHOS 66  BILITOT 0.8  PROT 7.7  ALBUMIN 3.2*   No results for input(s): LIPASE, AMYLASE in the last 168 hours. No results for input(s): AMMONIA in the last 168 hours. Coagulation Profile: Recent Labs  Lab 11/02/21 1642  INR 1.3*   Cardiac Enzymes: No results for input(s): CKTOTAL, CKMB, CKMBINDEX, TROPONINI in the last 168 hours. BNP (last 3 results) No results for input(s): PROBNP in the last 8760 hours. HbA1C: No results for input(s): HGBA1C in the last 72 hours. CBG: No results for input(s): GLUCAP in the last 168 hours. Lipid Profile: No results for input(s): CHOL, HDL, LDLCALC, TRIG, CHOLHDL, LDLDIRECT in the last 72 hours. Thyroid Function Tests: No results for input(s): TSH, T4TOTAL, FREET4, T3FREE, THYROIDAB in the last 72 hours. Anemia Panel: No results for input(s): VITAMINB12, FOLATE, FERRITIN, TIBC, IRON, RETICCTPCT in the last 72 hours. Urine analysis:    Component Value Date/Time   COLORURINE YELLOW 11/02/2021 1633   APPEARANCEUR HAZY (A) 11/02/2021 1633   LABSPEC 1.004 (L) 11/02/2021 1633   PHURINE 6.0 11/02/2021 1633   GLUCOSEU NEGATIVE 11/02/2021 1633   HGBUR SMALL (A) 11/02/2021 1633   BILIRUBINUR NEGATIVE 11/02/2021 1633   KETONESUR NEGATIVE 11/02/2021 1633   PROTEINUR 100  (A) 11/02/2021 1633   NITRITE POSITIVE (A) 11/02/2021 1633   LEUKOCYTESUR NEGATIVE 11/02/2021 1633    Radiological Exams on Admission: CT Angio Head W or Wo Contrast  Result Date: 11/02/2021 CLINICAL DATA:  Headache with severe neck pain. EXAM: CT ANGIOGRAPHY HEAD AND NECK TECHNIQUE: Multidetector CT imaging of the head and neck was performed using the standard protocol during bolus administration of intravenous contrast. Multiplanar CT image reconstructions and MIPs were obtained to evaluate the vascular anatomy. Carotid stenosis  measurements (when applicable) are obtained utilizing NASCET criteria, using the distal internal carotid diameter as the denominator. CONTRAST:  51mL OMNIPAQUE IOHEXOL 350 MG/ML SOLN COMPARISON:  None. FINDINGS: CT HEAD FINDINGS Brain: No evidence of acute infarction, hemorrhage, hydrocephalus, extra-axial collection or mass lesion/mass effect. Mild chronic small vessel disease in the hemispheric white matter. Chronic lacune at the right caudate head. Vascular: See below Skull: Negative Sinuses: Negative Orbits: Negative Review of the MIP images confirms the above findings CTA NECK FINDINGS Aortic arch: Normal Right carotid system: Vessels are smooth and widely patent Left carotid system: Vessels are smooth and widely patent. Minimal atheromatous changes. Vertebral arteries: No proximal subclavian stenosis. Both vertebral arteries are smoothly contoured and widely patent. Skeleton: Subtle but convincing retropharyngeal/prevertebral fat haziness. No associated muscular calcification or focal endplate erosion. The cervical spine is diffusely degenerated. No evidence of mucosal inflammation the level of the throat. Other neck: As above Upper chest: Negative Review of the MIP images confirms the above findings CTA HEAD FINDINGS Anterior circulation: No significant stenosis, proximal occlusion, aneurysm, or vascular malformation. Posterior circulation: Vessels are smooth and diffusely  patent. Negative for aneurysm or vascular malformation. Venous sinuses: Unremarkable Anatomic variants: No significant variant. Review of the MIP images confirms the above findings IMPRESSION: 1. Retropharyngeal edema without visible pharyngeal or spinal source, suggest enhanced cervical MRI and inflammatory labs. 2. Mild for age atherosclerosis. Electronically Signed   By: Jorje Guild M.D.   On: 11/02/2021 12:02   CT Angio Neck W and/or Wo Contrast  Result Date: 11/02/2021 CLINICAL DATA:  Headache with severe neck pain. EXAM: CT ANGIOGRAPHY HEAD AND NECK TECHNIQUE: Multidetector CT imaging of the head and neck was performed using the standard protocol during bolus administration of intravenous contrast. Multiplanar CT image reconstructions and MIPs were obtained to evaluate the vascular anatomy. Carotid stenosis measurements (when applicable) are obtained utilizing NASCET criteria, using the distal internal carotid diameter as the denominator. CONTRAST:  13mL OMNIPAQUE IOHEXOL 350 MG/ML SOLN COMPARISON:  None. FINDINGS: CT HEAD FINDINGS Brain: No evidence of acute infarction, hemorrhage, hydrocephalus, extra-axial collection or mass lesion/mass effect. Mild chronic small vessel disease in the hemispheric white matter. Chronic lacune at the right caudate head. Vascular: See below Skull: Negative Sinuses: Negative Orbits: Negative Review of the MIP images confirms the above findings CTA NECK FINDINGS Aortic arch: Normal Right carotid system: Vessels are smooth and widely patent Left carotid system: Vessels are smooth and widely patent. Minimal atheromatous changes. Vertebral arteries: No proximal subclavian stenosis. Both vertebral arteries are smoothly contoured and widely patent. Skeleton: Subtle but convincing retropharyngeal/prevertebral fat haziness. No associated muscular calcification or focal endplate erosion. The cervical spine is diffusely degenerated. No evidence of mucosal inflammation the level  of the throat. Other neck: As above Upper chest: Negative Review of the MIP images confirms the above findings CTA HEAD FINDINGS Anterior circulation: No significant stenosis, proximal occlusion, aneurysm, or vascular malformation. Posterior circulation: Vessels are smooth and diffusely patent. Negative for aneurysm or vascular malformation. Venous sinuses: Unremarkable Anatomic variants: No significant variant. Review of the MIP images confirms the above findings IMPRESSION: 1. Retropharyngeal edema without visible pharyngeal or spinal source, suggest enhanced cervical MRI and inflammatory labs. 2. Mild for age atherosclerosis. Electronically Signed   By: Jorje Guild M.D.   On: 11/02/2021 12:02   DG Chest Portable 1 View  Result Date: 11/02/2021 CLINICAL DATA:  Cough EXAM: PORTABLE CHEST 1 VIEW COMPARISON:  Chest x-ray dated October 26, 2021 FINDINGS: Cardiac and mediastinal contours  within normal limits. Slightly decreased bilateral patchy airspace disease. No large pleural effusion or pneumothorax. IMPRESSION: Bilateral patchy airspace disease is likely slightly decreased compared with prior exam, although evaluation is somewhat limited due to differences in technique. Recommend follow-up PA and lateral chest x-ray in 6-8 weeks to ensure complete resolution. Electronically Signed   By: Yetta Glassman M.D.   On: 11/02/2021 09:43    EKG: Personally reviewed. Sinus tachycardia, rate 114.  Similar to prior.  Assessment/Plan Principal Problem:   Sepsis ([place]) Active Problems:   Hypertension   Hyperlipidemia   Asthma   Acute respiratory failure with hypoxia ([place])   Charles Weiss. is a 70 y.o. male with medical history significant for asthma, hypertension, hyperlipidemia, recent admission for acute hypoxic respiratory failure due to influenza pneumonia who is admitted with sepsis due to possible retropharyngeal and/or CNS infection.  Sepsis due to suspected retropharyngeal infection  versus discitis versus possible meningitis: Patient presenting with leukocytosis, fever, tachypnea, tachycardia.  CTA head/neck shows retropharyngeal edema without clear source.  ENT reviewed images, they do not see any discrete fluid collection.  Recommendation was for IV Decadron, broad-spectrum antibiotics, repeat imaging as needed.  They will be available if needed.  Patient does have neck stiffness and associated back pain concerning for meningitis.  Currently moving extremities appropriately with intact strength. -Continue empiric meningitis treatment with IV vancomycin, cefepime, ampicillin, acyclovir -IV Decadron 8 mg every 8 hours x 3 doses -Obtain MRI brain, MRI whole-spine -Order for IR guided LP with labs placed -Follow blood cultures -Continue IV fluid hydration overnight -Discussed with neurology, they will follow  Acute respiratory failure with hypoxia: Likely combination of residual inflammation from recent influenza pneumonia, asthma, as well as increased respiratory effort in setting of sepsis. -Continue supplemental oxygen and albuterol as needed -Continue Decadron/antibiotics as above  Asthma: Wheezing present on admission.  Continue supplemental oxygen, Decadron, Dulera, albuterol as needed.  Dysuria: UA does show positive nitrites and rare bacteria.  Urine culture pending.  On broad-spectrum antibiotics as above.  Hypertension: Resume home amlodipine and losartan.  Hyperlipidemia: Continue statin.  DVT prophylaxis: SCDs only for now with plan for lumbar puncture Code Status: Full code, confirmed with patient on admission Family Communication: Discussed with patient, he has discussed with family Disposition Plan: From home, dispo pending clinical progress Consults called: EDP discussed with on-call ENT Level of care: Progressive Admission status:  Status is: Inpatient  Remains inpatient appropriate because: High risk sepsis due to possible retropharyngeal  and/or CNS infection, requires ongoing broad-spectrum IV antibiotics pending further cultural data, IV steroids, and IV fluid resuscitation.   Zada Finders MD [facility]  If 7PM-7AM, please contact night-coverage [email or web address]  11/02/2021, 10:56 PM

## 2021-11-02 NOTE — ED Notes (Signed)
Pt transported to CT ?

## 2021-11-02 NOTE — ED Provider Notes (Signed)
[facility] DEPT Provider Note   CSN: [id number] Arrival date & time: 11/02/21  3532     History Chief Complaint  Patient presents with   Neck Pain   Headache    Charles Givens Wood Novacek. is a 70 y.o. male with a history of hypertension, hyperlipidemia, asthma.  Per chart review patient was admitted to the emergency department for hypoxia secondary to influenza from 11/4-11/8.  Presents to the emergency department today with a chief complaint of headache and neck pain.  Patient reports that his symptoms started Tuesday morning 11/8.  Symptoms have been constant since then.  Headache onset was gradual and pain progressively worse over time.  Patient rates pain 10/10 on the pain scale.  Patient reports that pain is behind his ears, bilateral neck, and across his forehead.  Pain is worse with movement and changing positions.  Patient denies any relief with over-the-counter pain medication.  Patient endorses neck stiffness.  Endorses productive cough.  Patient reports that with influenza his cough was nonproductive.  Denies any recent falls or injuries.  Denies any fever, chills, numbness, weakness, facial asymmetry, dysarthria, visual disturbance, saddle anesthesia, bowel or bladder dysfunction, abdominal pain, nausea, vomiting, diarrhea, chest pain, shortness of breath.   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 No pertinent surgical history.     Family History  Problem Relation Age of Onset   Hypertension Mother    Skin cancer Mother    Osteoporosis Mother    Hypertension Father     Social History   Tobacco Use   Smoking status: Every Day    Types: Cigarettes   Smokeless tobacco: Never  Vaping Use   Vaping Use: Never used  Substance Use Topics   Alcohol use: Never   Drug use: Never    Home Medications Prior to Admission medications   Medication Sig Start Date End Date Taking? Authorizing Provider  acetaminophen (TYLENOL) 650 MG CR tablet Take 650 mg by mouth every 8 (eight) hours as needed for pain.    [provider]  albuterol (PROVENTIL) (2.5 MG/3ML) 0.083% nebulizer solution Take 3 mLs by nebulization 3 (three) times daily. 10/28/21   Lavina Hamman, MD  amLODipine (NORVASC) 5 MG tablet Take 5 mg by mouth daily.    [provider]  aspirin 81 MG chewable tablet Chew by mouth daily.    [provider]  benzonatate (TESSALON) 100 MG capsule Take 1 capsule (100 mg total) by mouth 3 (  three) times daily. 10/28/21   Lavina Hamman, MD  chlorpheniramine-HYDROcodone (TUSSIONEX) 10-8 MG/5ML SUER Take 5 mLs by mouth every 12 (twelve) hours as needed for cough. 10/28/21   Lavina Hamman, MD  Cholecalciferol (VITAMIN D3) 25 MCG (1000 UT) CAPS Take 1,000 Units by mouth daily.    [provider]  Cyanocobalamin (B-12 PO) Take 1 tablet by mouth daily.    [provider]  dextromethorphan (DELSYM) 30 MG/5ML liquid Take 5 mLs (30 mg total) by mouth 2 (two) times daily as needed for cough. 10/28/21   Lavina Hamman, MD  dextromethorphan-guaiFENesin [facility] DM) 30-600 MG 12hr tablet Take 1 tablet by mouth 2 (two) times daily. 10/28/21   Lavina Hamman, MD  docusate sodium (COLACE) 100 MG capsule Take 1 capsule (100 mg total) by mouth 2 (two) times daily. 10/28/21 10/28/22  Lavina Hamman, MD  gabapentin (NEURONTIN) 300 MG capsule Take 1 tablet (300 mg) in the morning and 2 tablets (600 mg) before bed. Patient taking differently: Take 300-600 mg by mouth See admin instructions. Take 300 mg in the morning and afternoon and 600 mg before bed. 08/01/21   Raspet, Erin K, PA-C  ketotifen (ZADITOR) 0.025 % ophthalmic solution Place 1 drop into both eyes 2 (two) times daily as needed (allergies).    [provider]  losartan (COZAAR) 50 MG tablet Take 50 mg by mouth daily.    [provider]  omeprazole (PRILOSEC) 20 MG capsule Take 20 mg by mouth daily.    [provider]  polyethylene glycol (MIRALAX / GLYCOLAX) 17 g packet Take 17 g by mouth daily. 10/29/21   Lavina Hamman, MD  predniSONE (DELTASONE) 10 MG tablet Take $RemoveBef'50mg'dkbywYqmrb$  daily for 3days,Take $RemoveBefore'40mg'UViyRVNDQNivG$  daily for 3days,Take $RemoveBefore'30mg'NUCiKYMwVgQgT$  daily for 3days,Take $RemoveBefore'20mg'lXwMHzagTVpKq$  daily for 3days,Take $RemoveBefore'10mg'DdfgQBEDFzxvL$  daily for 3days, then stop 10/28/21   Lavina Hamman, MD  Pyridoxine HCl (B-6 PO) Take 1 tablet by mouth daily.    [provider]  simvastatin (ZOCOR) 40 MG tablet Take 40 mg by mouth daily.    [provider]  tizanidine (ZANAFLEX) 2 MG capsule Take 1 capsule (2 mg total) by mouth at bedtime as needed for muscle spasms. Patient not taking: Reported on 10/24/2021 08/01/21   Raspet, Erin K, PA-C  WIXELA INHUB 250-50 MCG/ACT AEPB Inhale 1 puff into the lungs 2 (two) times daily. 10/09/21   [provider]    Allergies    Elavil [amitriptyline] and Tetracyclines & related  Review of Systems   Review of Systems  Constitutional:  Negative for chills and fever.  Eyes:  Negative for visual disturbance.  Respiratory:  Positive for  cough. Negative for shortness of breath.   Cardiovascular:  Negative for chest pain.  Gastrointestinal:  Negative for abdominal pain, diarrhea, nausea and vomiting.  Genitourinary:  Negative for difficulty urinating and dysuria.  Musculoskeletal:  Positive for neck pain and neck stiffness. Negative for back pain.  Skin:  Negative for color change and rash.  Neurological:  Positive for headaches. Negative for dizziness, tremors, seizures, syncope, facial asymmetry, speech difficulty, weakness, light-headedness and numbness.  Psychiatric/Behavioral:  Negative for confusion.    Physical Exam Updated Vital Signs BP (!) 148/88   Pulse 96   Temp 98.2 F (36.8 C) (Oral)   Resp 16   Ht 6' (1.829 m)   Wt 113.4 kg   SpO2 92%   BMI 33.91 kg/m   Physical Exam Vitals and nursing note reviewed.  Constitutional:  General: He is not in acute distress.    Appearance: He is not ill-appearing, toxic-appearing or diaphoretic.     Comments: Appears uncomfortable d/t neck pain  Eyes:     General: No scleral icterus.       Right eye: No discharge.        Left eye: No discharge.     Extraocular Movements: Extraocular movements intact.     Conjunctiva/sclera: Conjunctivae normal.     Pupils: Pupils are equal, round, and reactive to light.  Neck:     Meningeal: Brudzinski's sign absent.     Comments: Decreased cervical range of motion secondary to complaints of pain.  Tenderness throughout neck.   Cardiovascular:     Rate and Rhythm: Normal rate.  Pulmonary:     Effort: Pulmonary effort is normal. No tachypnea, bradypnea or respiratory distress.     Breath sounds: Examination of the right-middle field reveals rhonchi. Examination of the right-lower field reveals rhonchi. Wheezing and rhonchi present. No decreased breath sounds or rales.     Comments: Expiratory wheezing noted to all lung fields.  Rhonchi to right mid and lower lobes.  Speaks in full sentences without difficulty Abdominal:      General: Abdomen is protuberant.     Palpations: Abdomen is soft. There is no mass or pulsatile mass.     Tenderness: There is no abdominal tenderness. There is no guarding or rebound.  Musculoskeletal:     Cervical back: Neck supple. No edema, erythema, signs of trauma, rigidity, torticollis or crepitus. Pain with movement and muscular tenderness present. No spinous process tenderness. Decreased range of motion.  Skin:    General: Skin is warm and dry.  Neurological:     General: No focal deficit present.     Mental Status: He is alert and oriented to person, place, and time.     GCS: GCS eye subscore is 4. GCS verbal subscore is 5. GCS motor subscore is 6.     Cranial Nerves: No cranial nerve deficit or facial asymmetry.     Sensory: Sensation is intact.     Motor: No weakness, tremor, seizure activity or pronator drift.     Coordination: Finger-Nose-Finger Test normal.     Comments: CN II-XII intact; performed in supine position, +5 strength to bilateral upper extremities, +5 strength to dorsiflexion and plantarflexion, patient able to lift both legs against gravity and hold each there without difficulty.  Sensation to light touch intact to bilateral upper and lower extremities.  Psychiatric:        Behavior: Behavior is cooperative.    ED Results / Procedures / Treatments   Labs (all labs ordered are listed, but only abnormal results are displayed) Labs Reviewed  CBC WITH DIFFERENTIAL/PLATELET - Abnormal; Notable for the following components:      Result Value   WBC 19.4 (*)    RBC 4.19 (*)    HCT 37.4 (*)    Neutro Abs 17.4 (*)    Lymphs Abs 0.5 (*)    Monocytes Absolute 1.3 (*)    Abs Immature Granulocytes 0.15 (*)    All other components within normal limits  COMPREHENSIVE METABOLIC PANEL - Abnormal; Notable for the following components:   Sodium 132 (*)    Chloride 95 (*)    Glucose, Bld 153 (*)    BUN 29 (*)    Albumin 3.2 (*)    All other components within normal  limits  SEDIMENTATION RATE - Abnormal; Notable for the following  components:   Sed Rate 95 (*)    All other components within normal limits  C-REACTIVE PROTEIN - Abnormal; Notable for the following components:   CRP 33.5 (*)    All other components within normal limits  CULTURE, BLOOD (ROUTINE X 2)  CULTURE, BLOOD (ROUTINE X 2)  RESP PANEL BY RT-PCR (FLU A&B, COVID) ARPGX2  URINE CULTURE  LACTIC ACID, PLASMA  LACTIC ACID, PLASMA  PROTIME-INR  APTT  URINALYSIS, ROUTINE W REFLEX MICROSCOPIC    EKG None  Radiology CT Angio Head W or Wo Contrast  Result Date: 11/02/2021 CLINICAL DATA:  Headache with severe neck pain. EXAM: CT ANGIOGRAPHY HEAD AND NECK TECHNIQUE: Multidetector CT imaging of the head and neck was performed using the standard protocol during bolus administration of intravenous contrast. Multiplanar CT image reconstructions and MIPs were obtained to evaluate the vascular anatomy. Carotid stenosis measurements (when applicable) are obtained utilizing NASCET criteria, using the distal internal carotid diameter as the denominator. CONTRAST:  22mL OMNIPAQUE IOHEXOL 350 MG/ML SOLN COMPARISON:  None. FINDINGS: CT HEAD FINDINGS Brain: No evidence of acute infarction, hemorrhage, hydrocephalus, extra-axial collection or mass lesion/mass effect. Mild chronic small vessel disease in the hemispheric white matter. Chronic lacune at the right caudate head. Vascular: See below Skull: Negative Sinuses: Negative Orbits: Negative Review of the MIP images confirms the above findings CTA NECK FINDINGS Aortic arch: Normal Right carotid system: Vessels are smooth and widely patent Left carotid system: Vessels are smooth and widely patent. Minimal atheromatous changes. Vertebral arteries: No proximal subclavian stenosis. Both vertebral arteries are smoothly contoured and widely patent. Skeleton: Subtle but convincing retropharyngeal/prevertebral fat haziness. No associated muscular calcification or focal  endplate erosion. The cervical spine is diffusely degenerated. No evidence of mucosal inflammation the level of the throat. Other neck: As above Upper chest: Negative Review of the MIP images confirms the above findings CTA HEAD FINDINGS Anterior circulation: No significant stenosis, proximal occlusion, aneurysm, or vascular malformation. Posterior circulation: Vessels are smooth and diffusely patent. Negative for aneurysm or vascular malformation. Venous sinuses: Unremarkable Anatomic variants: No significant variant. Review of the MIP images confirms the above findings IMPRESSION: 1. Retropharyngeal edema without visible pharyngeal or spinal source, suggest enhanced cervical MRI and inflammatory labs. 2. Mild for age atherosclerosis. Electronically Signed   By: Jorje Guild M.D.   On: 11/02/2021 12:02   CT Angio Neck W and/or Wo Contrast  Result Date: 11/02/2021 CLINICAL DATA:  Headache with severe neck pain. EXAM: CT ANGIOGRAPHY HEAD AND NECK TECHNIQUE: Multidetector CT imaging of the head and neck was performed using the standard protocol during bolus administration of intravenous contrast. Multiplanar CT image reconstructions and MIPs were obtained to evaluate the vascular anatomy. Carotid stenosis measurements (when applicable) are obtained utilizing NASCET criteria, using the distal internal carotid diameter as the denominator. CONTRAST:  18mL OMNIPAQUE IOHEXOL 350 MG/ML SOLN COMPARISON:  None. FINDINGS: CT HEAD FINDINGS Brain: No evidence of acute infarction, hemorrhage, hydrocephalus, extra-axial collection or mass lesion/mass effect. Mild chronic small vessel disease in the hemispheric white matter. Chronic lacune at the right caudate head. Vascular: See below Skull: Negative Sinuses: Negative Orbits: Negative Review of the MIP images confirms the above findings CTA NECK FINDINGS Aortic arch: Normal Right carotid system: Vessels are smooth and widely patent Left carotid system: Vessels are smooth  and widely patent. Minimal atheromatous changes. Vertebral arteries: No proximal subclavian stenosis. Both vertebral arteries are smoothly contoured and widely patent. Skeleton: Subtle but convincing retropharyngeal/prevertebral fat haziness. No associated muscular calcification  or focal endplate erosion. The cervical spine is diffusely degenerated. No evidence of mucosal inflammation the level of the throat. Other neck: As above Upper chest: Negative Review of the MIP images confirms the above findings CTA HEAD FINDINGS Anterior circulation: No significant stenosis, proximal occlusion, aneurysm, or vascular malformation. Posterior circulation: Vessels are smooth and diffusely patent. Negative for aneurysm or vascular malformation. Venous sinuses: Unremarkable Anatomic variants: No significant variant. Review of the MIP images confirms the above findings IMPRESSION: 1. Retropharyngeal edema without visible pharyngeal or spinal source, suggest enhanced cervical MRI and inflammatory labs. 2. Mild for age atherosclerosis. Electronically Signed   By: Jorje Guild M.D.   On: 11/02/2021 12:02   DG Chest Portable 1 View  Result Date: 11/02/2021 CLINICAL DATA:  Cough EXAM: PORTABLE CHEST 1 VIEW COMPARISON:  Chest x-ray dated October 26, 2021 FINDINGS: Cardiac and mediastinal contours within normal limits. Slightly decreased bilateral patchy airspace disease. No large pleural effusion or pneumothorax. IMPRESSION: Bilateral patchy airspace disease is likely slightly decreased compared with prior exam, although evaluation is somewhat limited due to differences in technique. Recommend follow-up PA and lateral chest x-ray in 6-8 weeks to ensure complete resolution. Electronically Signed   By: Yetta Glassman M.D.   On: 11/02/2021 09:43    Procedures Procedures   Medications Ordered in ED Medications - No data to display  ED Course  I have reviewed the triage vital signs and the nursing notes.  Pertinent labs  & imaging results that were available during my care of the patient were reviewed by me and considered in my medical decision making (see chart for details).    MDM Rules/Calculators/A&P                           Alert 70 year old male no acute distress, nontoxic-appearing.  Presents to ED with chief complaint of neck pain and headache.  Patient reports that his symptoms started on Tuesday morning 11/8 and have been constant since then.  Patient reports pain is worse with changing positions.  Endorses associated neck pain and stiffness.  Minimal relief with over-the-counter pain medication.  Denies any numbness, weakness, facial asymmetry, dysarthria, visual disturbance.  Neuro exam is reassuring.  Negative Brudzinski's.  Patient does have decreased range of motion to cervical neck secondary to complaints of pain.  Neck is supple.  No neck stiffness however patient does complain of pain with range of passive range of motion.  We will give patient migraine cocktail and muscle relaxer and reassess.  Additionally will obtain CTA of head and neck due to his pain and stiffness.  Patient noted to have expiratory wheezing in all lung fields.  Rhonchi to right mid and lower lobe.  Will obtain chest x-ray, basic lab work to assess for possible pneumonia.  We will give patient albuterol to help with wheezing.  CTA shows retropharyngeal edema present physical pharyngeal spinal source.  Cervical MRI and inflammatory labs were recommended.  CBC shows leukocytosis at 19 CMP shows BUN slightly elevated  Will obtain blood culture, ESR, CRP, MRI with and without contrast of cervical/thoracic/lumbar spine.  MRI, ESR CRP  pending at this time.  If MRI shows no abnormalities we will perform LP.    Patient care transferred to [place] at the end of my shift. Patient presentation, ED course, and plan of care discussed with review of all pertinent labs and imaging. Please see his/her note for further details  regarding further ED course  and disposition.   Patient was discussed with and evaluated by Dr. Maryan Rued.   Final Clinical Impression(s) / ED Diagnoses Final diagnoses:  None    Rx / DC Orders ED Discharge Orders     None        Loni Beckwith, PA-C 11/02/21 1709    Blanchie Dessert, MD 11/03/21 [phone]

## 2021-11-02 NOTE — ED Notes (Signed)
Pt refused MRI and was brought back into the room.

## 2021-11-03 ENCOUNTER — Inpatient Hospital Stay (HOSPITAL_COMMUNITY): Payer: Medicare Other

## 2021-11-03 DIAGNOSIS — R7881 Bacteremia: Secondary | ICD-10-CM

## 2021-11-03 DIAGNOSIS — J45909 Unspecified asthma, uncomplicated: Secondary | ICD-10-CM | POA: Diagnosis not present

## 2021-11-03 DIAGNOSIS — A419 Sepsis, unspecified organism: Secondary | ICD-10-CM

## 2021-11-03 DIAGNOSIS — J390 Retropharyngeal and parapharyngeal abscess: Secondary | ICD-10-CM

## 2021-11-03 DIAGNOSIS — M542 Cervicalgia: Secondary | ICD-10-CM | POA: Diagnosis not present

## 2021-11-03 DIAGNOSIS — M4620 Osteomyelitis of vertebra, site unspecified: Secondary | ICD-10-CM | POA: Diagnosis present

## 2021-11-03 DIAGNOSIS — G062 Extradural and subdural abscess, unspecified: Secondary | ICD-10-CM

## 2021-11-03 DIAGNOSIS — G039 Meningitis, unspecified: Secondary | ICD-10-CM | POA: Diagnosis not present

## 2021-11-03 DIAGNOSIS — B9562 Methicillin resistant Staphylococcus aureus infection as the cause of diseases classified elsewhere: Secondary | ICD-10-CM

## 2021-11-03 LAB — COMPREHENSIVE METABOLIC PANEL
ALT: 30 U/L (ref 0–44)
AST: 19 U/L (ref 15–41)
Albumin: 2.5 g/dL — ABNORMAL LOW (ref 3.5–5.0)
Alkaline Phosphatase: 68 U/L (ref 38–126)
Anion gap: 10 (ref 5–15)
BUN: 19 mg/dL (ref 8–23)
CO2: 26 mmol/L (ref 22–32)
Calcium: 8.7 mg/dL — ABNORMAL LOW (ref 8.9–10.3)
Chloride: 97 mmol/L — ABNORMAL LOW (ref 98–111)
Creatinine, Ser: 0.41 mg/dL — ABNORMAL LOW (ref 0.61–1.24)
GFR, Estimated: >60 mL/min (ref >60)
Glucose, Bld: 185 mg/dL — ABNORMAL HIGH (ref 70–99)
Potassium: 4.3 mmol/L (ref 3.5–5.1)
Sodium: 133 mmol/L — ABNORMAL LOW (ref 135–145)
Total Bilirubin: 1.1 mg/dL (ref 0.3–1.2)
Total Protein: 6.7 g/dL (ref 6.5–8.1)

## 2021-11-03 LAB — BLOOD CULTURE ID PANEL (REFLEXED) - BCID2

## 2021-11-03 LAB — ECHOCARDIOGRAM COMPLETE
Area-P 1/2: 3.6 cm2
Calc EF: 52.7 %
Height: 72 in
MV VTI: 3.25 cm2
S' Lateral: 3.6 cm
Single Plane A2C EF: 56 %
Single Plane A4C EF: 51 %
Weight: 4000 oz

## 2021-11-03 LAB — CBC
HCT: 33 % — ABNORMAL LOW (ref 39.0–52.0)
Hemoglobin: 11.5 g/dL — ABNORMAL LOW (ref 13.0–17.0)
MCH: 30.9 pg (ref 26.0–34.0)
MCHC: 34.8 g/dL (ref 30.0–36.0)
MCV: 88.7 fL (ref 80.0–100.0)
Platelets: 252 10*3/uL (ref 150–400)
RBC: 3.72 MIL/uL — ABNORMAL LOW (ref 4.22–5.81)
RDW: 14.3 % (ref 11.5–15.5)
WBC: 30 10*3/uL — ABNORMAL HIGH (ref 4.0–10.5)
nRBC: 0 % (ref 0.0–0.2)

## 2021-11-03 LAB — PROCALCITONIN: Procalcitonin: 2.69 ng/mL

## 2021-11-03 IMAGING — MR MR HEAD WO/W CM
9 of 13 series · 36 of 48 positions shown · IV contrast (10 GADAVIST)
Comparison: Head and neck CTA [DATE]

CLINICAL DATA: Meningitis/CNS infection suspected.

EXAM:
MRI HEAD WITHOUT AND WITH CONTRAST
TECHNIQUE: Multiplanar, multiecho pulse sequences of the brain and surrounding
structures were obtained without and with intravenous contrast.
CONTRAST:  10mL GADAVIST GADOBUTROL 1 MMOL/ML IV SOLN

[Series 5: DWI · axial · 3.0mm · 1.36mm/px · z∈[-62,+87]mm · 6 of 115 slices shown (1 of 2)]
[im 1/115]
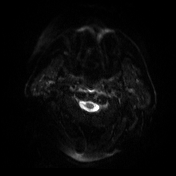
[im 23/115]
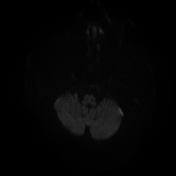
[im 46/115]
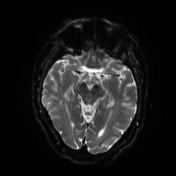
[im 69/115]
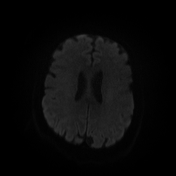
[im 92/115]
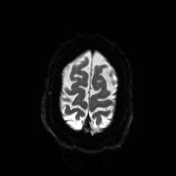
[im 115/115]
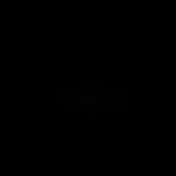

[Series 6: DWI · axial · 3.0mm · 1.36mm/px · z∈[-62,+87]mm · 3 of 58 slices shown (2 of 2)]
[im 1/58]
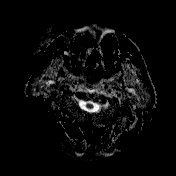
[im 29/58]
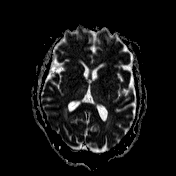
[im 58/58]
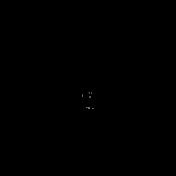

[Series 7: T1 · sagittal · 5.0mm · 0.75mm/px · 1 of 26 slices shown (1 of 2)]
[im 1/26]
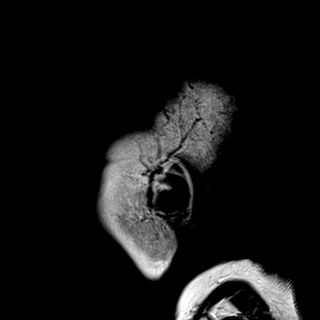

[Series 8: T2 · axial · 5.0mm · 0.62mm/px · 1 of 28 slices shown]
[im 1/28]
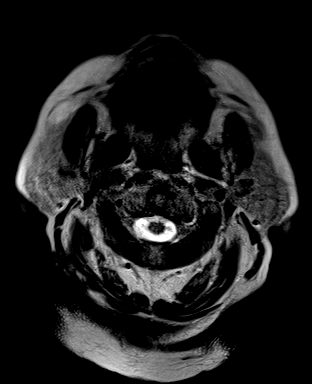

[Series 9: swi_images · axial · 3.0mm · 0.75mm/px · z∈[-77,+46]mm · 3 of 72 slices shown]
[im 1/72]
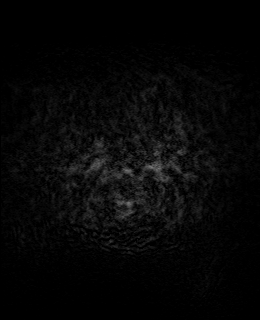
[im 24/72]
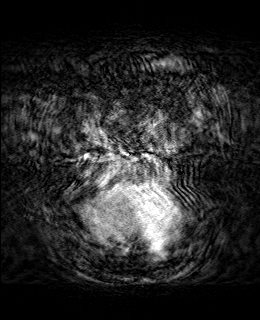
[im 48/72]
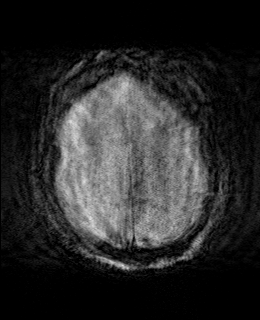

[Series 11: FLAIR · axial · 3.0mm · 0.75mm/px · z∈[-51,+83]mm · 3 of 52 slices shown]
[im 1/52]
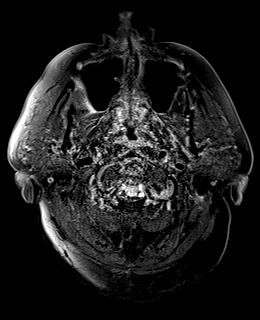
[im 26/52]
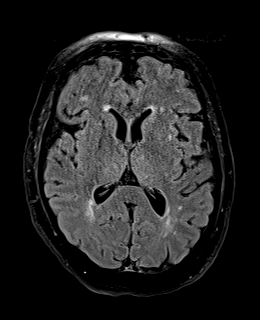
[im 52/52]
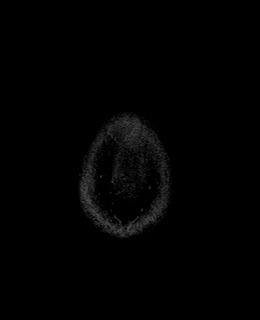

[Series 12: T1 · axial · 1.0mm · 0.47mm/px · z∈[-74,+93]mm · 8 of 192 slices shown (2 of 2)]
[im 1/192]
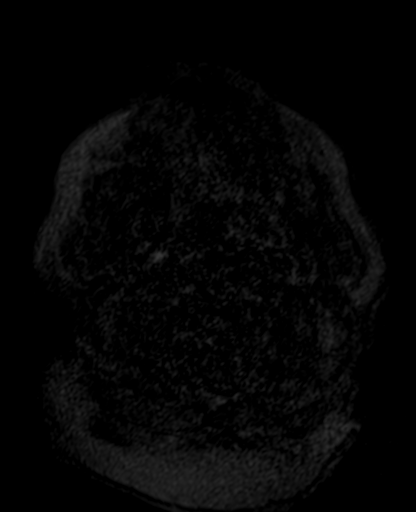
[im 24/192]
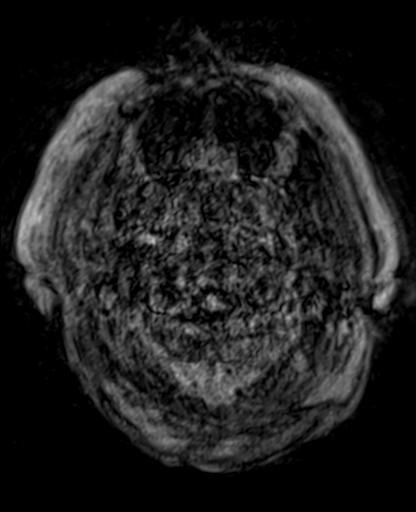
[im 48/192]
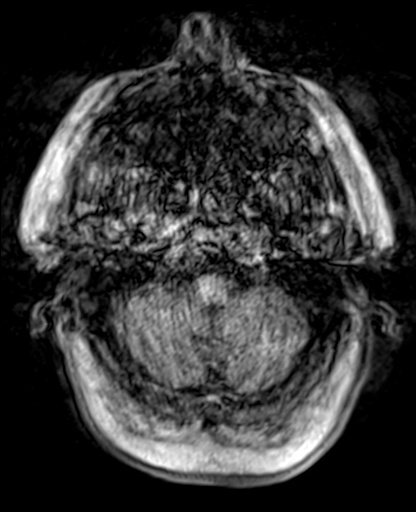
[im 72/192]
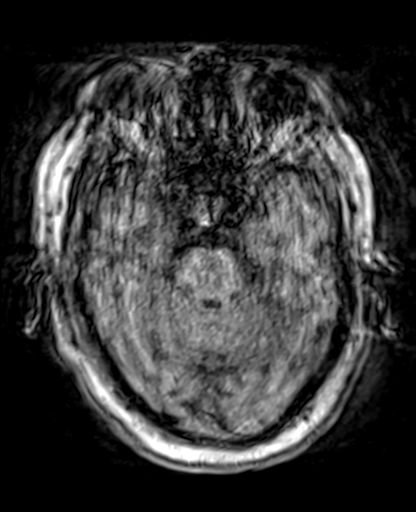
[im 120/192]
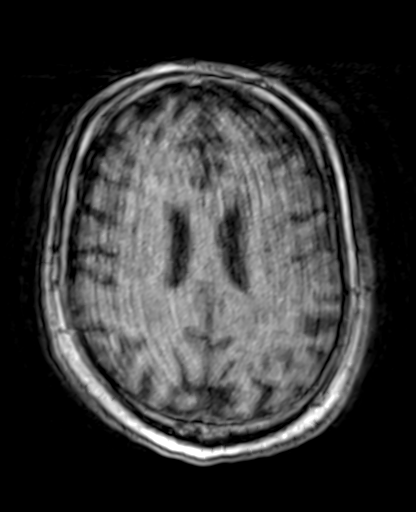
[im 144/192]
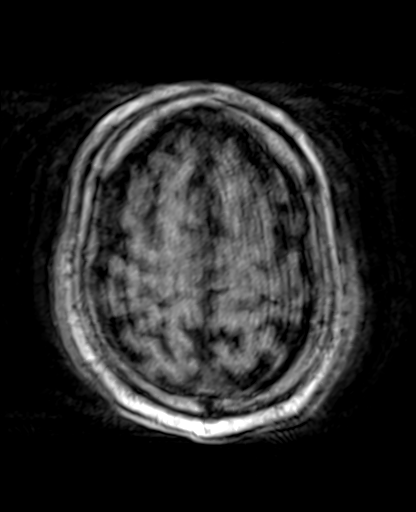
[im 168/192]
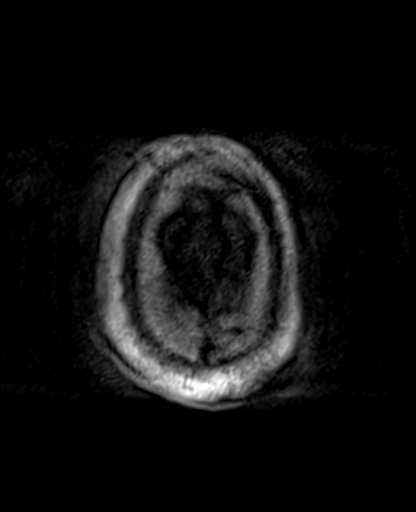
[im 192/192]
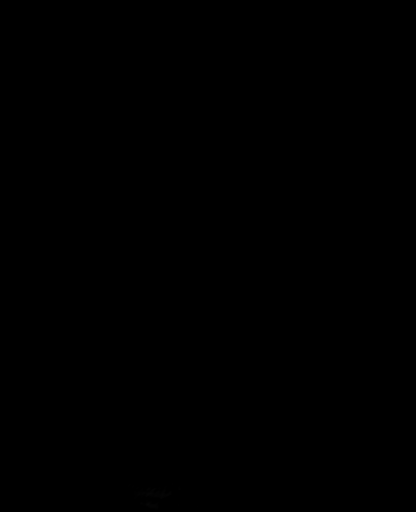

[Series 20: T2 post-contrast · coronal · 5.0mm · 0.57mm/px · 2 of 32 slices shown]
[im 1/32]
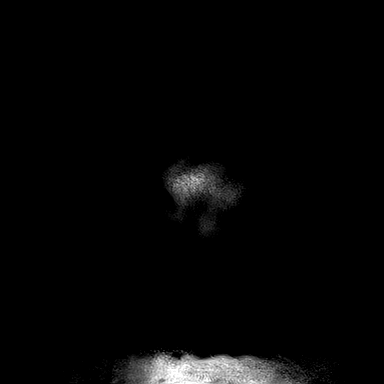
[im 32/32]
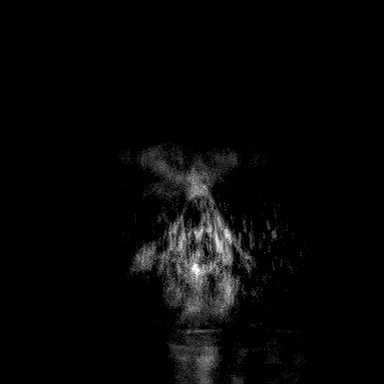

[Series 21: T1 post-contrast · axial · 1.0mm · 0.47mm/px · z∈[-74,+93]mm · 9 of 192 slices shown]
[im 1/192]
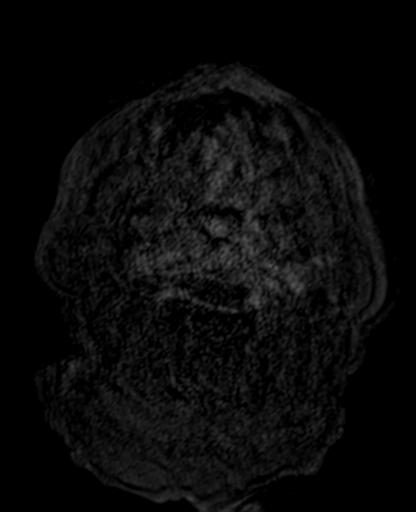
[im 24/192]
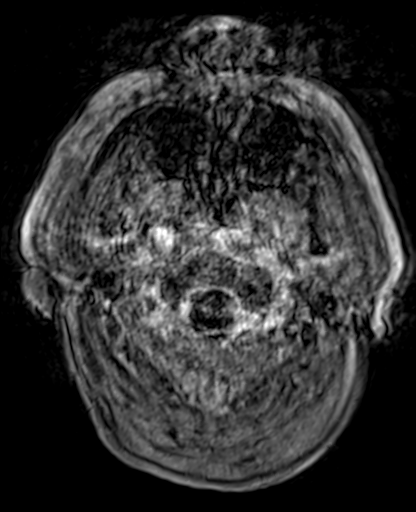
[im 48/192]
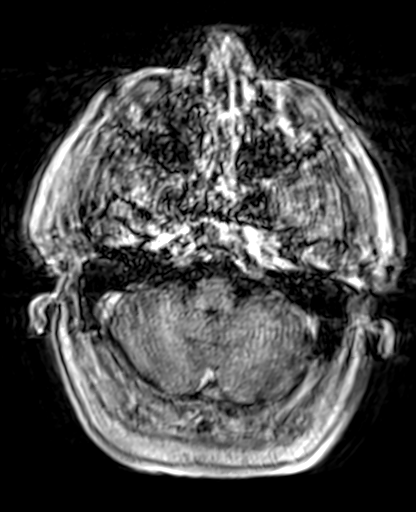
[im 72/192]
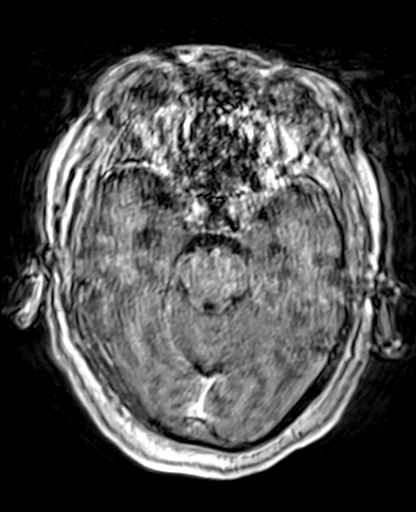
[im 96/192]
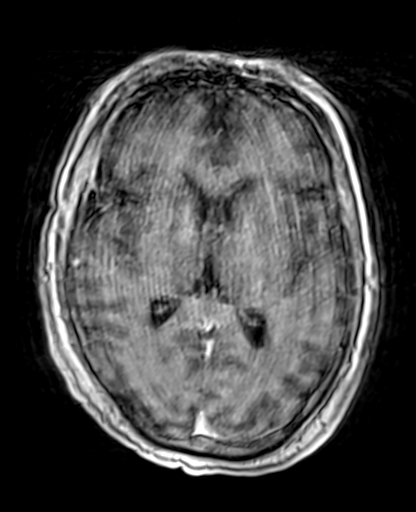
[im 120/192]
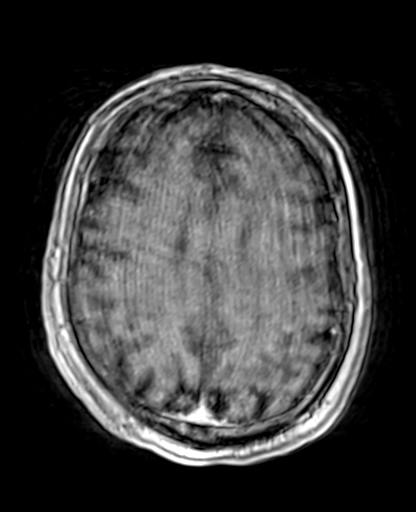
[im 144/192]
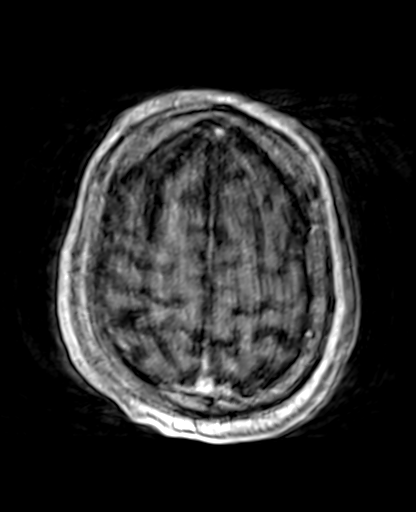
[im 168/192]
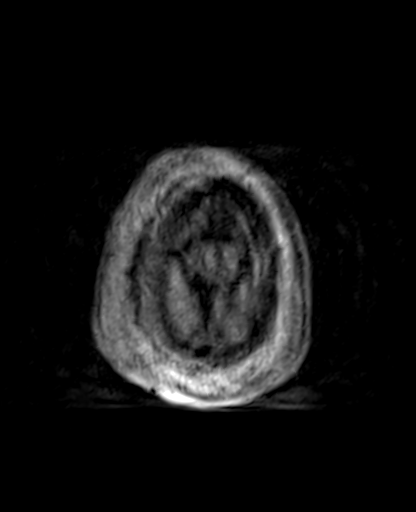
[im 192/192]
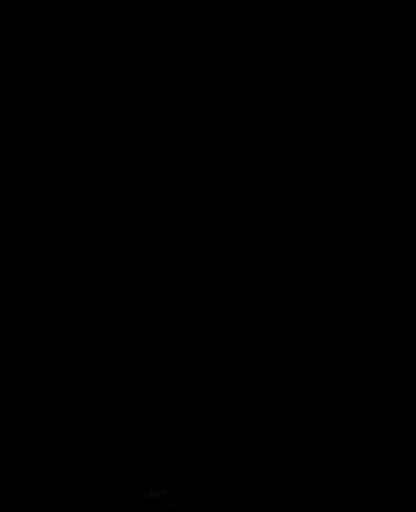

[36 of 48 positions shown; findings below may reference images not displayed]

FINDINGS: The study is motion degraded including severe motion artifact on
postcontrast and susceptibility weighted imaging.

Brain: There is no evidence of an acute infarct, intracranial
hemorrhage, mass, midline shift, or extra-axial fluid collection. T2
hyperintensities in the cerebral white matter and pons are
nonspecific but compatible with mildly to moderately age advanced
chronic small vessel ischemic disease. The ventricles and sulci are
within normal limits for age. No gross abnormal intracranial
enhancement is identified although assessment is severely limited by
motion. No subarachnoid space debris is evident on FLAIR or
diffusion weighted imaging.

Vascular: Major intracranial vascular flow voids are preserved.

Skull and upper cervical spine: Unremarkable bone marrow signal.

Sinuses/Orbits: Unremarkable orbits. Paranasal sinuses and mastoid
air cells are clear.

Other: None.
IMPRESSION: 1. Severely motion degraded examination without evidence of acute
intracranial abnormality.
2. Mild-to-moderate chronic small vessel ischemic disease.

## 2021-11-03 MED ORDER — GADOBUTROL 1 MMOL/ML IV SOLN
10.0000 mL | Freq: Once | INTRAVENOUS | Status: AC | PRN
  Administered 2021-11-03: 10 mL via INTRAVENOUS

## 2021-11-03 MED ORDER — LORAZEPAM 2 MG/ML IJ SOLN
2.0000 mg | Freq: Once | INTRAMUSCULAR | Status: AC | PRN
  Administered 2021-11-03: 2 mg via INTRAVENOUS
  Filled 2021-11-03: qty 1

## 2021-11-03 MED ORDER — LACTATED RINGERS IV SOLN
INTRAVENOUS | Status: DC
Start: 2021-11-03 — End: 2021-11-05

## 2021-11-03 MED ORDER — SODIUM CHLORIDE 0.9 % IV SOLN
2.0000 g | Freq: Three times a day (TID) | INTRAVENOUS | Status: DC
  Filled 2021-11-03: qty 2

## 2021-11-03 MED ORDER — SODIUM CHLORIDE 0.9 % IV SOLN
1.0000 g | Freq: Four times a day (QID) | INTRAVENOUS | Status: DC
  Filled 2021-11-03: qty 1000

## 2021-11-03 MED ORDER — PERFLUTREN LIPID MICROSPHERE
1.0000 mL | INTRAVENOUS | Status: AC | PRN
  Administered 2021-11-03: 2 mL via INTRAVENOUS
  Filled 2021-11-03: qty 10

## 2021-11-03 MED ORDER — LORAZEPAM 2 MG/ML IJ SOLN
1.0000 mg | Freq: Four times a day (QID) | INTRAMUSCULAR | Status: DC | PRN
  Administered 2021-11-04 – 2021-11-06 (×3): 1 mg via INTRAVENOUS
  Filled 2021-11-03 (×3): qty 1

## 2021-11-03 MED ORDER — SODIUM CHLORIDE 0.9 % IV SOLN: INTRAVENOUS | Status: DC | PRN

## 2021-11-03 NOTE — Consult Note (Signed)
[name]    Date of Admission:  11/02/2021     Reason for Consult: mrsa bacteremia    Referring Provider: Roderic Palau   Abx: 11/13-c vanc        Assessment: 70 yo male with asthma, gerd, htn/hlp, recent influenza pna admitted to ED 11/13 with neck pain found to have sepsis and mrsa bsi along with diffuse spinal osseous involvement and retropharyngeal space edema   Mri spine diffuse lumbar epidural enhancement without abscess. L4-5 OM changes. C5-6 prevertebral enhancement no osseous finding  No neurologic deficit. No ams or clinical concern of meningitis at this time  Doesn't appear to be a superimposed mrsa bacterial pna. Unclear source  11/13 bcx mrsa  No hardware/cardiac device  Plan: Repeat bcx tomorrow 11/14 Continue vancomycin for now Tte If persistent bacteremia >3 days from admission, will need tee and abd/pelv imaging Anticipate at least 8 weeks antibiotics treatment Don't feel need for LP at this time Discussed with primary care      ------------------------------------------------ Principal Problem:   Sepsis ([place]) Active Problems:   Hypertension   Hyperlipidemia   Asthma   Acute respiratory failure with hypoxia (HCC)    HPI: Charles Weiss. is a 70 y.o. male admitted with acute onset neck pain/fever found to have mrsa bacteremia  Patient has recent influenza pna/asthma exacerbation 11/4-8 admission  Hasn't felt quite at baseline yet and still experiencing malaise. Had progressive neck pain since discharge worse with movement. Dyspnea mild; cough stable nonproductive. Subjective f/c as well  Came due to increased neck pain  Febrile in ed Imaging with cervical prevetebral enhancement/edema and diffuse lumbar epidural enhancement (no abscess) and lumbar vertebral OM changes Cxr decreased bilateral airspace disease On room air when I spoke with him today 11/14  No neurologic deficit  No  confusion  Started on appropriate abx No hardware/cardiac device   No rash    Family History  Problem Relation Age of Onset   Hypertension Mother    Skin cancer Mother    Osteoporosis Mother    Hypertension Father     Social History   Tobacco Use   Smoking status: Every Day    Types: Cigarettes   Smokeless tobacco: Never  Vaping Use   Vaping Use: Never used  Substance Use Topics   Alcohol use: Never   Drug use: Never    Allergies  Allergen Reactions   Elavil [Amitriptyline] Other (See Comments)   Tetracyclines & Related Other (See Comments)    Review of Systems: ROS All Other ROS was negative, except mentioned above   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    GERD (gastroesophageal reflux disease)    Hyperlipidemia    Hypertension    Pneumonia due to COVID-19 virus    Pneumonia, bacterial    Prostate cancer ([place])        Scheduled Meds:  amLODipine  5 mg Oral Daily   atorvastatin  20 mg Oral Daily   dexamethasone (DECADRON) injection  8 mg Intravenous Q8H   losartan  50 mg Oral Daily   mometasone-formoterol  2 puff Inhalation BID   sodium chloride flush  3 mL Intravenous Q12H   Continuous Infusions:  sodium chloride 10 mL/hr at 11/03/21 1032   vancomycin 1,000 mg (11/03/21 1033)   PRN Meds:.sodium chloride, acetaminophen **OR** acetaminophen, albuterol, morphine injection, naLOXone (NARCAN)  injection,  ondansetron **OR** ondansetron (ZOFRAN) IV, perflutren lipid microspheres (DEFINITY) IV suspension, senna-docusate   OBJECTIVE: Blood pressure (!) 165/107, pulse (!) 105, temperature 98.4 F (36.9 C), temperature source Oral, resp. rate 20, height 6' (1.829 m), weight 113.4 kg, SpO2 99 %.  Physical Exam General/constitutional: no distress, pleasant HEENT: Normocephalic, PER, Conj Clear, EOMI, Oropharynx clear Neck supple CV: rrr no mrg Lungs: coarse bs, normal respiratory effort Abd:  Soft, Nontender Ext: no edema Skin: No Rash Neuro: nonfocal MSK: no peripheral joint swelling/tenderness/warmth; neck spine tender and also lumbar.  Psych alert/oriented   Lab Results Lab Results  Component Value Date   WBC 30.0 (H) 11/03/2021   HGB 11.5 (L) 11/03/2021   HCT 33.0 (L) 11/03/2021   MCV 88.7 11/03/2021   PLT 252 11/03/2021    Lab Results  Component Value Date   CREATININE 0.41 (L) 11/03/2021   BUN 19 11/03/2021   NA 133 (L) 11/03/2021   K 4.3 11/03/2021   CL 97 (L) 11/03/2021   CO2 26 11/03/2021    Lab Results  Component Value Date   ALT 30 11/03/2021   AST 19 11/03/2021   ALKPHOS 68 11/03/2021   BILITOT 1.1 11/03/2021      Microbiology: Recent Results (from the past 240 hour(s))  Blood culture (routine x 2)     Status: None (Preliminary result)   Collection Time: 11/02/21 12:12 PM   Specimen: BLOOD  Result Value Ref Range Status   Specimen Description   Final    BLOOD RIGHT ANTECUBITAL Performed at [facility], [place] [address] [place], [place] [address]    Special Requests   Final    BOTTLES DRAWN AEROBIC AND ANAEROBIC Blood Culture results may not be optimal due to an excessive volume of blood received in culture bottles Performed at [facility] [address] [place], [place] [address]    Culture  Setup Time   Final    GRAM POSITIVE COCCI IN CLUSTERS IN BOTH AEROBIC AND ANAEROBIC BOTTLES CRITICAL RESULT CALLED TO, READ BACK BY AND VERIFIED WITH: M LILLISTON,PHARMD@[id number]  11/03/21 [place] Performed at [facility], [place] [address], [place], [place] [address]    Culture GRAM POSITIVE COCCI  Final   Report Status PENDING  Incomplete  Blood Culture ID Panel (Reflexed)     Status: Abnormal   Collection Time: 11/02/21 12:12 PM  Result Value Ref Range Status   Enterococcus faecalis NOT DETECTED NOT DETECTED Final   Enterococcus Faecium NOT DETECTED NOT DETECTED Final   Listeria monocytogenes NOT DETECTED  NOT DETECTED Final   Staphylococcus species DETECTED (A) NOT DETECTED Final    Comment: CRITICAL RESULT CALLED TO, READ BACK BY AND VERIFIED WITH: M LILLISTON,PHARMD@[id number]  11/03/21 [place]    Staphylococcus aureus (BCID) DETECTED (A) NOT DETECTED Final    Comment: Methicillin (oxacillin)-resistant Staphylococcus aureus (MRSA). MRSA is predictably resistant to beta-lactam antibiotics (except ceftaroline). Preferred therapy is vancomycin unless clinically contraindicated. Patient requires contact precautions if  hospitalized. CRITICAL RESULT CALLED TO, READ BACK BY AND VERIFIED WITH: M LILLISTON,PHARMD@[id number]  11/03/21 [place]    Staphylococcus epidermidis NOT DETECTED NOT DETECTED Final   Staphylococcus lugdunensis NOT DETECTED NOT DETECTED Final   Streptococcus species NOT DETECTED NOT DETECTED Final   Streptococcus agalactiae NOT DETECTED NOT DETECTED Final   Streptococcus pneumoniae NOT DETECTED NOT DETECTED Final   Streptococcus pyogenes NOT DETECTED NOT DETECTED Final   A.calcoaceticus-baumannii NOT DETECTED NOT DETECTED Final   Bacteroides fragilis NOT DETECTED NOT DETECTED Final  Enterobacterales NOT DETECTED NOT DETECTED Final   Enterobacter cloacae complex NOT DETECTED NOT DETECTED Final   Escherichia coli NOT DETECTED NOT DETECTED Final   Klebsiella aerogenes NOT DETECTED NOT DETECTED Final   Klebsiella oxytoca NOT DETECTED NOT DETECTED Final   Klebsiella pneumoniae NOT DETECTED NOT DETECTED Final   Proteus species NOT DETECTED NOT DETECTED Final   Salmonella species NOT DETECTED NOT DETECTED Final   Serratia marcescens NOT DETECTED NOT DETECTED Final   Haemophilus influenzae NOT DETECTED NOT DETECTED Final   Neisseria meningitidis NOT DETECTED NOT DETECTED Final   Pseudomonas aeruginosa NOT DETECTED NOT DETECTED Final   Stenotrophomonas maltophilia NOT DETECTED NOT DETECTED Final   Candida albicans NOT DETECTED NOT DETECTED Final   Candida auris NOT DETECTED NOT DETECTED Final    Candida glabrata NOT DETECTED NOT DETECTED Final   Candida krusei NOT DETECTED NOT DETECTED Final   Candida parapsilosis NOT DETECTED NOT DETECTED Final   Candida tropicalis NOT DETECTED NOT DETECTED Final   Cryptococcus neoformans/gattii NOT DETECTED NOT DETECTED Final   Meth resistant mecA/C and MREJ DETECTED (A) NOT DETECTED Final    Comment: CRITICAL RESULT CALLED TO, READ BACK BY AND VERIFIED WITH: M LILLISTON,PHARMD@[id number]  11/03/21 [place] Performed at [facility], [address] [place], [place] [address]   Blood culture (routine x 2)     Status: None (Preliminary result)   Collection Time: 11/02/21 12:17 PM   Specimen: BLOOD  Result Value Ref Range Status   Specimen Description   Final    BLOOD LEFT ANTECUBITAL Performed at [facility] [address] [place], [place] [address]    Special Requests   Final    BOTTLES DRAWN AEROBIC AND ANAEROBIC Blood Culture adequate volume Performed at [facility] [address] [place], [place] [address]    Culture  Setup Time   Final    GRAM POSITIVE COCCI IN CLUSTERS IN BOTH AEROBIC AND ANAEROBIC BOTTLES CRITICAL VALUE NOTED.  VALUE IS CONSISTENT WITH PREVIOUSLY REPORTED AND CALLED VALUE.    Culture   Final    NO GROWTH < 12 HOURS Performed at [facility], [place] [address], [place], [place] [address]    Report Status PENDING  Incomplete  Resp Panel by RT-PCR (Flu A&B, Covid) Nasopharyngeal Swab     Status: None   Collection Time: 11/02/21  4:16 PM   Specimen: Nasopharyngeal Swab; Nasopharyngeal(NP) swabs in vial transport medium  Result Value Ref Range Status   SARS Coronavirus 2 by RT PCR NEGATIVE NEGATIVE Final    Comment: (NOTE) SARS-CoV-2 target nucleic acids are NOT DETECTED.  The SARS-CoV-2 RNA is generally detectable in upper respiratory specimens during the acute phase of infection. The lowest concentration of SARS-CoV-2 viral copies this assay can detect is 138 copies/mL.  A negative result does not preclude SARS-Cov-2 infection and should not be used as the sole basis for treatment or other patient management decisions. A negative result may occur with  improper specimen collection/handling, submission of specimen other than nasopharyngeal swab, presence of viral mutation(s) within the areas targeted by this assay, and inadequate number of viral copies(<138 copies/mL). A negative result must be combined with clinical observations, patient history, and epidemiological information. The expected result is Negative.  Fact Sheet for Patients:  EntrepreneurPulse.com.au  Fact Sheet for Healthcare Providers:  IncredibleEmployment.be  This test is no t yet approved or cleared by the [place] FDA and  has been authorized for detection and/or diagnosis of  SARS-CoV-2 by FDA under an Emergency Use Authorization (EUA). This EUA will remain  in effect (meaning this test can be used) for the duration of the COVID-19 declaration under Section 564(b)(1) of the Act, 21 U.S.C.section 360bbb-3(b)(1), unless the authorization is terminated  or revoked sooner.       Influenza A by PCR NEGATIVE NEGATIVE Final   Influenza B by PCR NEGATIVE NEGATIVE Final    Comment: (NOTE) The Xpert Xpress SARS-CoV-2/FLU/RSV plus assay is intended as an aid in the diagnosis of influenza from Nasopharyngeal swab specimens and should not be used as a sole basis for treatment. Nasal washings and aspirates are unacceptable for Xpert Xpress SARS-CoV-2/FLU/RSV testing.  Fact Sheet for Patients: EntrepreneurPulse.com.au  Fact Sheet for Healthcare Providers: IncredibleEmployment.be  This test is not yet approved or cleared by the [place] FDA and has been authorized for detection and/or diagnosis of SARS-CoV-2 by FDA under an Emergency Use Authorization (EUA). This EUA will remain in effect (meaning this test  can be used) for the duration of the COVID-19 declaration under Section 564(b)(1) of the Act, 21 U.S.C. section 360bbb-3(b)(1), unless the authorization is terminated or revoked.  Performed at [facility], [place] [address] [place], [place] [address]      Serology:    Imaging: If present, new imagings (plain films, ct scans, and mri) have been personally visualized and interpreted; radiology reports have been reviewed. Decision making incorporated into the Impression / Recommendations.  11/13 cxr Bilateral patchy airspace disease is likely slightly decreased compared with prior exam, although evaluation is somewhat limited due to differences in technique. Recommend follow-up PA and lateral chest x-ray in 6-8 weeks to ensure complete resolution.  11/13 ct angio head/neck 1. Retropharyngeal edema without visible pharyngeal or spinal source, suggest enhanced cervical MRI and inflammatory labs. 2. Mild for age atherosclerosis.  11/14 mri entire spine 1. Fluid and enhancement within the disc space at L4-5 with adjacent edema and enhancement of the endplates. 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 2. Extensive epidural enhancement in the lower lumbar spine L2-3 through the sacrum compatible with infection. Recommend lumbar puncture further evaluation. 3. Prevertebral edema with peripheral postcontrast enhancement extending from the skull base through C5-6. Focal source for infection in the upper cervical spine not identified. 4. The study is moderately degraded by patient motion. 5. 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 6. Diffuse epidural enhancement at L2-3, L3-4, L4-5, and L5-S1 without discrete epidural abscess. 7. Moderate central canal and bilateral foraminal stenosis at L3-4. 8. Mild foraminal narrowing bilaterally at T7-8, T8-9 and T9-10 secondary to facet spurring.    Jabier Mutton, [facility]  [facility] [place] [phone] pager    11/03/2021, 12:19 PM

## 2021-11-03 NOTE — TOC Progression Note (Signed)
Transition of Care (TOC) - Progression Note    Patient Details  Name: Charles Weiss. MRN: [id number] Date of Birth: 1951-01-08  Transition of Care [facility]) CM/SW Contact  Leeroy Cha, RN Phone Number: 11/03/2021, 9:35 AM  Clinical Narrative:     70 y.o. male with medical history significant for asthma, hypertension, hyperlipidemia, recent admission for acute hypoxic respiratory failure due to influenza pneumonia who presents to the ED for evaluation of neck pain and headache.  Patient recently admitted on 10/24/2021-10/28/2021 for acute hypoxic respiratory failure secondary to influenza pneumonia and asthma exacerbation.   Patient states that since he has been home he has been feeling unwell.  He has been having posterior headaches and neck pain since day of discharge.  Symptoms have been constant and progressively worse, more noticeable with movement and positional change.  He has had some shortness of breath and nonproductive cough.  He has had some chills and diaphoresis.  He denies any chest pain, nausea, vomiting, abdominal pain.  He does report dysuria.  He is also having back pain.   ED Course:  Initial vitals showed BP 143/94, pulse 96, RR 18, temp 98.2 F, SPO2 95% on room air.  While in the ED patient was febrile with T-max 103.4 F rectally.  He has been tachycardic with pulse 110s, tachypneic with RR high 20s.  SPO2 was 88% on room air and he was placed on 2 L O2 via [place] with improvement >94%.   Labs significant for WBC 19.4, hemoglobin 13.1, platelets 292,000, sodium 132, potassium 3.7, bicarb 27, BUN 29, creatinine 0.92, serum glucose 153, ESR 95, CRP 33.5, lactic acid 1.2.  INR 1.3.   Blood cultures collected and in process.  Urinalysis shows positive nitrates, negative leukocytes, 21-50 RBC/hpf, 6-10 WBC/hpf, rare bacteria microscopy.  Urine culture obtained and in process.   Portable chest x-ray shows slightly decreased bilateral patchy airspace disease compared  to prior exam.   CTA head/neck shows retropharyngeal edema without visible pharyngeal or spinal source.   MRI cervical, thoracic, lumbar spine ordered but were not able to be completed.   Patient was given several doses IV morphine, IV Ativan 1 mg, IV vancomycin, cefepime, Flagyl, 2 L LR followed by maintenance fluids, IV Reglan, albuterol nebulizer.   EDP discussed with on-call ENT, Dr. Fredric Dine, who reviewed the images and felt there was edema without a drainable collection present.  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    Expected Discharge Plan and Services                                                 Social Determinants of Health (SDOH) Interventions    Readmission Risk Interventions No flowsheet data found.

## 2021-11-03 NOTE — Progress Notes (Signed)
PROGRESS NOTE    Charles Durrell Parc.  OJJ:[id number] DOB: [birth year] DOA: 11/02/2021 PCP: System, Provider Not In    Brief Narrative:  70 year old male with a history of asthma, hypertension, with recent admission for influenza pneumonia and respiratory failure, comes back to the emergency room with neck pain and headache.  He is noted to have a high fever.  Upon work-up, he was found to have evidence of discitis in the lumbar vertebra.  Blood cultures positive for MRSA.  He was started on vancomycin.   Assessment & Plan:   Principal Problem:   Sepsis ([place]) Active Problems:   Hypertension   Hyperlipidemia   Asthma   Acute respiratory failure with hypoxia (HCC)   Vertebral osteomyelitis (HCC)   MRSA bacteremia   Retropharyngeal abscess   Sepsis, secondary to MRSA bacteremia -Noted to be febrile with T-max of 103.4, tachycardic heart rate in the 110s, tachypneic source of infection is discitis.  Evidence of endorgan damage includes encephalopathy related to sepsis. -Infectious disease following -Currently on vancomycin -Plan to repeat blood cultures on 11/15 -Will eventually need PICC line once cultures have cleared -2D echocardiogram performed that was unremarkable  Discitis/vertebral osteomyelitis -Noted to have significant discitis and lumbar vertebral/sacrum -Also noted to have significant epidural enhancement -Continue on IV antibiotics -Discussed with infectious disease/neurology was not felt that lumbar puncture would change management at this point  Acute encephalopathy -Related to sepsis -He is somnolent at this time -Continue to monitor  Hypertension -Currently on amlodipine -Holding hydrochlorothiazide since it is likely following completed -Blood pressure likely further elevated due to pain  Hyperlipidemia -Continued on statin  Sore throat -Noted on CT scan to have retropharyngeal erythema.  No comment of obvious abscess no CT -EDP discussed with  ENT who recommended conservative management with IV antibiotics -Was also recommended that he receive Decadron 8 mg every 8 hours x 3 doses -If symptoms worsen over the next 24 to 48 hours, recommendations are for reimaging  Asthma -Currently no wheezing -Continue on bronchodilators, Dulera   DVT prophylaxis: SCDs Start: 11/02/21 2028  Code Status: Full code Family Communication: Discussed with son at the bedside Disposition Plan: Status is: Inpatient  Remains inpatient appropriate because: Continued IV antibiotics for MRSA bacteremia         Consultants:  Infectious disease Neurology  Procedures:  Echocardiogram  Antimicrobials:  Vancomycin 11/13 >   Subjective: Continues to have back pain and neck pain.  No shortness of breath.  Objective: Vitals:   11/03/21 0255 11/03/21 0618 11/03/21 1255 11/03/21 1643  BP: (!) 156/95 (!) 165/107 (!) 162/99 (!) 176/99  Pulse: (!) 106 (!) 105 (!) 108 100  Resp: 20 20 20 18   Temp: 98.3 F (36.8 C) 98.4 F (36.9 C) 98.7 F (37.1 C) 97.9 F (36.6 C)  TempSrc: Oral Oral Oral Oral  SpO2: 97% 99% 97% 96%  Weight:      Height:        Intake/Output Summary (Last 24 hours) at 11/03/2021 1936 Last data filed at 11/03/2021 1801 Gross per 24 hour  Intake 2250.64 ml  Output 1225 ml  Net 1025.64 ml   Filed Weights   11/02/21 0701  Weight: 113.4 kg    Examination:  General exam: Patient is lethargic, but does open his eyes and interacts Respiratory system: Clear to auscultation. Respiratory effort normal. Cardiovascular system: S1 & S2 heard, RRR. No JVD, murmurs, rubs, gallops or clicks. No pedal edema. Gastrointestinal system: Abdomen is nondistended, soft and nontender. No  organomegaly or masses felt. Normal bowel sounds heard. Central nervous system: No focal neurological deficits. Extremities: Symmetric 5 x 5 power. Skin: No rashes, lesions or ulcers Psychiatry: Somnolent    Data Reviewed: I have personally  reviewed following labs and imaging studies  CBC: Recent Labs  Lab 11/02/21 0859 11/03/21 0440  WBC 19.4* 30.0*  NEUTROABS 17.4*  --   HGB 13.1 11.5*  HCT 37.4* 33.0*  MCV 89.3 88.7  PLT 292 017   Basic Metabolic Panel: Recent Labs  Lab 11/02/21 1028 11/03/21 0440  NA 132* 133*  K 3.7 4.3  CL 95* 97*  CO2 27 26  GLUCOSE 153* 185*  BUN 29* 19  CREATININE 0.92 0.41*  CALCIUM 8.9 8.7*   GFR: Estimated Creatinine Clearance: 111.7 mL/min (A) (by C-G formula based on SCr of 0.41 mg/dL (L)). Liver Function Tests: Recent Labs  Lab 11/02/21 1028 11/03/21 0440  AST 20 19  ALT 34 30  ALKPHOS 66 68  BILITOT 0.8 1.1  PROT 7.7 6.7  ALBUMIN 3.2* 2.5*   No results for input(s): LIPASE, AMYLASE in the last 168 hours. No results for input(s): AMMONIA in the last 168 hours. Coagulation Profile: Recent Labs  Lab 11/02/21 1642  INR 1.3*   Cardiac Enzymes: No results for input(s): CKTOTAL, CKMB, CKMBINDEX, TROPONINI in the last 168 hours. BNP (last 3 results) No results for input(s): PROBNP in the last 8760 hours. HbA1C: No results for input(s): HGBA1C in the last 72 hours. CBG: No results for input(s): GLUCAP in the last 168 hours. Lipid Profile: No results for input(s): CHOL, HDL, LDLCALC, TRIG, CHOLHDL, LDLDIRECT in the last 72 hours. Thyroid Function Tests: No results for input(s): TSH, T4TOTAL, FREET4, T3FREE, THYROIDAB in the last 72 hours. Anemia Panel: No results for input(s): VITAMINB12, FOLATE, FERRITIN, TIBC, IRON, RETICCTPCT in the last 72 hours. Sepsis Labs: Recent Labs  Lab 11/02/21 1632 11/03/21 0440  PROCALCITON  --  2.69  LATICACIDVEN 1.2  --     Recent Results (from the past 240 hour(s))  Blood culture (routine x 2)     Status: None (Preliminary result)   Collection Time: 11/02/21 12:12 PM   Specimen: BLOOD  Result Value Ref Range Status   Specimen Description   Final    BLOOD RIGHT ANTECUBITAL Performed at [facility],  [place] [address] [place], [place] [address]    Special Requests   Final    BOTTLES DRAWN AEROBIC AND ANAEROBIC Blood Culture results may not be optimal due to an excessive volume of blood received in culture bottles Performed at [place] [address], [place], [place] [address]    Culture  Setup Time   Final    GRAM POSITIVE COCCI IN CLUSTERS IN BOTH AEROBIC AND ANAEROBIC BOTTLES CRITICAL RESULT CALLED TO, READ BACK BY AND VERIFIED WITH: M LILLISTON,PHARMD@[id number]  11/03/21 [place] Performed at [facility], [place] [address] [place], [place] [address]    Culture GRAM POSITIVE COCCI  Final   Report Status PENDING  Incomplete  Blood Culture ID Panel (Reflexed)     Status: Abnormal   Collection Time: 11/02/21 12:12 PM  Result Value Ref Range Status   Enterococcus faecalis NOT DETECTED NOT DETECTED Final   Enterococcus Faecium NOT DETECTED NOT DETECTED Final   Listeria monocytogenes NOT DETECTED NOT DETECTED Final   Staphylococcus species DETECTED (A) NOT DETECTED Final    Comment: CRITICAL RESULT CALLED TO, READ BACK BY AND VERIFIED WITH: M LILLISTON,PHARMD@0431  11/03/21 [place]  Staphylococcus aureus (BCID) DETECTED (A) NOT DETECTED Final    Comment: Methicillin (oxacillin)-resistant Staphylococcus aureus (MRSA). MRSA is predictably resistant to beta-lactam antibiotics (except ceftaroline). Preferred therapy is vancomycin unless clinically contraindicated. Patient requires contact precautions if  hospitalized. CRITICAL RESULT CALLED TO, READ BACK BY AND VERIFIED WITH: M LILLISTON,PHARMD@0431  11/03/21 [place]    Staphylococcus epidermidis NOT DETECTED NOT DETECTED Final   Staphylococcus lugdunensis NOT DETECTED NOT DETECTED Final   Streptococcus species NOT DETECTED NOT DETECTED Final   Streptococcus agalactiae NOT DETECTED NOT DETECTED Final   Streptococcus pneumoniae NOT DETECTED NOT DETECTED Final   Streptococcus pyogenes NOT DETECTED NOT DETECTED Final    A.calcoaceticus-baumannii NOT DETECTED NOT DETECTED Final   Bacteroides fragilis NOT DETECTED NOT DETECTED Final   Enterobacterales NOT DETECTED NOT DETECTED Final   Enterobacter cloacae complex NOT DETECTED NOT DETECTED Final   Escherichia coli NOT DETECTED NOT DETECTED Final   Klebsiella aerogenes NOT DETECTED NOT DETECTED Final   Klebsiella oxytoca NOT DETECTED NOT DETECTED Final   Klebsiella pneumoniae NOT DETECTED NOT DETECTED Final   Proteus species NOT DETECTED NOT DETECTED Final   Salmonella species NOT DETECTED NOT DETECTED Final   Serratia marcescens NOT DETECTED NOT DETECTED Final   Haemophilus influenzae NOT DETECTED NOT DETECTED Final   Neisseria meningitidis NOT DETECTED NOT DETECTED Final   Pseudomonas aeruginosa NOT DETECTED NOT DETECTED Final   Stenotrophomonas maltophilia NOT DETECTED NOT DETECTED Final   Candida albicans NOT DETECTED NOT DETECTED Final   Candida auris NOT DETECTED NOT DETECTED Final   Candida glabrata NOT DETECTED NOT DETECTED Final   Candida krusei NOT DETECTED NOT DETECTED Final   Candida parapsilosis NOT DETECTED NOT DETECTED Final   Candida tropicalis NOT DETECTED NOT DETECTED Final   Cryptococcus neoformans/gattii NOT DETECTED NOT DETECTED Final   Meth resistant mecA/C and MREJ DETECTED (A) NOT DETECTED Final    Comment: CRITICAL RESULT CALLED TO, READ BACK BY AND VERIFIED WITH: M LILLISTON,PHARMD@0431  11/03/21 [place] Performed at [facility], [address] [place], [place] [address]   Blood culture (routine x 2)     Status: None (Preliminary result)   Collection Time: 11/02/21 12:17 PM   Specimen: BLOOD  Result Value Ref Range Status   Specimen Description   Final    BLOOD LEFT ANTECUBITAL Performed at [place] [address] [place], [place] [address]    Special Requests   Final    BOTTLES DRAWN AEROBIC AND ANAEROBIC Blood Culture adequate volume Performed at [place] [address], [place], [place] [address]    Culture  Setup Time   Final    GRAM POSITIVE COCCI IN CLUSTERS IN BOTH AEROBIC AND ANAEROBIC BOTTLES CRITICAL VALUE NOTED.  VALUE IS CONSISTENT WITH PREVIOUSLY REPORTED AND CALLED VALUE.    Culture   Final    NO GROWTH < 12 HOURS Performed at [facility], [place] [address], [place],  [address]    Report Status PENDING  Incomplete  Resp Panel by RT-PCR (Flu A&B, Covid) Nasopharyngeal Swab     Status: None   Collection Time: 11/02/21  4:16 PM   Specimen: Nasopharyngeal Swab; Nasopharyngeal(NP) swabs in vial transport medium  Result Value Ref Range Status   SARS Coronavirus 2 by RT PCR NEGATIVE NEGATIVE Final    Comment: (NOTE) SARS-CoV-2 target nucleic acids are NOT DETECTED.  The SARS-CoV-2 RNA is generally detectable in upper respiratory specimens during the acute phase of infection. The lowest concentration of  SARS-CoV-2 viral copies this assay can detect is 138 copies/mL. A negative result does not preclude SARS-Cov-2 infection and should not be used as the sole basis for treatment or other patient management decisions. A negative result may occur with  improper specimen collection/handling, submission of specimen other than nasopharyngeal swab, presence of viral mutation(s) within the areas targeted by this assay, and inadequate number of viral copies(<138 copies/mL). A negative result must be combined with clinical observations, patient history, and epidemiological information. The expected result is Negative.  Fact Sheet for Patients:  EntrepreneurPulse.com.au  Fact Sheet for Healthcare Providers:  IncredibleEmployment.be  This test is no t yet approved or cleared by the [place] FDA and  has been authorized for detection and/or diagnosis of SARS-CoV-2 by FDA under an Emergency Use Authorization (EUA). This EUA will remain  in effect (meaning this test can be used) for the duration of  the COVID-19 declaration under Section 564(b)(1) of the Act, 21 U.S.C.section 360bbb-3(b)(1), unless the authorization is terminated  or revoked sooner.       Influenza A by PCR NEGATIVE NEGATIVE Final   Influenza B by PCR NEGATIVE NEGATIVE Final    Comment: (NOTE) The Xpert Xpress SARS-CoV-2/FLU/RSV plus assay is intended as an aid in the diagnosis of influenza from Nasopharyngeal swab specimens and should not be used as a sole basis for treatment. Nasal washings and aspirates are unacceptable for Xpert Xpress SARS-CoV-2/FLU/RSV testing.  Fact Sheet for Patients: EntrepreneurPulse.com.au  Fact Sheet for Healthcare Providers: IncredibleEmployment.be  This test is not yet approved or cleared by the [place] FDA and has been authorized for detection and/or diagnosis of SARS-CoV-2 by FDA under an Emergency Use Authorization (EUA). This EUA will remain in effect (meaning this test can be used) for the duration of the COVID-19 declaration under Section 564(b)(1) of the Act, 21 U.S.C. section 360bbb-3(b)(1), unless the authorization is terminated or revoked.  Performed at [facility], [place] [address], [place], [place] [address]          Radiology Studies: CT Angio Head W or Wo Contrast  Result Date: 11/02/2021 CLINICAL DATA:  Headache with severe neck pain. EXAM: CT ANGIOGRAPHY HEAD AND NECK TECHNIQUE: Multidetector CT imaging of the head and neck was performed using the standard protocol during bolus administration of intravenous contrast. Multiplanar CT image reconstructions and MIPs were obtained to evaluate the vascular anatomy. Carotid stenosis measurements (when applicable) are obtained utilizing NASCET criteria, using the distal internal carotid diameter as the denominator. CONTRAST:  13mL OMNIPAQUE IOHEXOL 350 MG/ML SOLN COMPARISON:  None. FINDINGS: CT HEAD FINDINGS Brain: No evidence of acute infarction,  hemorrhage, hydrocephalus, extra-axial collection or mass lesion/mass effect. Mild chronic small vessel disease in the hemispheric white matter. Chronic lacune at the right caudate head. Vascular: See below Skull: Negative Sinuses: Negative Orbits: Negative Review of the MIP images confirms the above findings CTA NECK FINDINGS Aortic arch: Normal Right carotid system: Vessels are smooth and widely patent Left carotid system: Vessels are smooth and widely patent. Minimal atheromatous changes. Vertebral arteries: No proximal subclavian stenosis. Both vertebral arteries are smoothly contoured and widely patent. Skeleton: Subtle but convincing retropharyngeal/prevertebral fat haziness. No associated muscular calcification or focal endplate erosion. The cervical spine is diffusely degenerated. No evidence of mucosal inflammation the level of the throat. Other neck: As above Upper chest: Negative Review of the MIP images confirms the above findings CTA HEAD FINDINGS Anterior circulation: No significant stenosis, proximal occlusion, aneurysm, or vascular malformation. Posterior circulation: Vessels  are smooth and diffusely patent. Negative for aneurysm or vascular malformation. Venous sinuses: Unremarkable Anatomic variants: No significant variant. Review of the MIP images confirms the above findings IMPRESSION: 1. Retropharyngeal edema without visible pharyngeal or spinal source, suggest enhanced cervical MRI and inflammatory labs. 2. Mild for age atherosclerosis. Electronically Signed   By: Jorje Guild M.D.   On: 11/02/2021 12:02   CT Angio Neck W and/or Wo Contrast  Result Date: 11/02/2021 CLINICAL DATA:  Headache with severe neck pain. EXAM: CT ANGIOGRAPHY HEAD AND NECK TECHNIQUE: Multidetector CT imaging of the head and neck was performed using the standard protocol during bolus administration of intravenous contrast. Multiplanar CT image reconstructions and MIPs were obtained to evaluate the vascular  anatomy. Carotid stenosis measurements (when applicable) are obtained utilizing NASCET criteria, using the distal internal carotid diameter as the denominator. CONTRAST:  38mL OMNIPAQUE IOHEXOL 350 MG/ML SOLN COMPARISON:  None. FINDINGS: CT HEAD FINDINGS Brain: No evidence of acute infarction, hemorrhage, hydrocephalus, extra-axial collection or mass lesion/mass effect. Mild chronic small vessel disease in the hemispheric white matter. Chronic lacune at the right caudate head. Vascular: See below Skull: Negative Sinuses: Negative Orbits: Negative Review of the MIP images confirms the above findings CTA NECK FINDINGS Aortic arch: Normal Right carotid system: Vessels are smooth and widely patent Left carotid system: Vessels are smooth and widely patent. Minimal atheromatous changes. Vertebral arteries: No proximal subclavian stenosis. Both vertebral arteries are smoothly contoured and widely patent. Skeleton: Subtle but convincing retropharyngeal/prevertebral fat haziness. No associated muscular calcification or focal endplate erosion. The cervical spine is diffusely degenerated. No evidence of mucosal inflammation the level of the throat. Other neck: As above Upper chest: Negative Review of the MIP images confirms the above findings CTA HEAD FINDINGS Anterior circulation: No significant stenosis, proximal occlusion, aneurysm, or vascular malformation. Posterior circulation: Vessels are smooth and diffusely patent. Negative for aneurysm or vascular malformation. Venous sinuses: Unremarkable Anatomic variants: No significant variant. Review of the MIP images confirms the above findings IMPRESSION: 1. Retropharyngeal edema without visible pharyngeal or spinal source, suggest enhanced cervical MRI and inflammatory labs. 2. Mild for age atherosclerosis. Electronically Signed   By: Jorje Guild M.D.   On: 11/02/2021 12:02   MR BRAIN W WO CONTRAST  Result Date: 11/03/2021 CLINICAL DATA:  Meningitis/CNS infection  suspected. EXAM: MRI HEAD WITHOUT AND WITH CONTRAST TECHNIQUE: Multiplanar, multiecho pulse sequences of the brain and surrounding structures were obtained without and with intravenous contrast. CONTRAST:  55mL GADAVIST GADOBUTROL 1 MMOL/ML IV SOLN COMPARISON:  Head and neck CTA 11/02/2021 FINDINGS: The study is motion degraded including severe motion artifact on postcontrast and susceptibility weighted imaging. Brain: There is no evidence of an acute infarct, intracranial hemorrhage, mass, midline shift, or extra-axial fluid collection. T2 hyperintensities in the cerebral white matter and pons are nonspecific but compatible with mildly to moderately age advanced chronic small vessel ischemic disease. The ventricles and sulci are within normal limits for age. No gross abnormal intracranial enhancement is identified although assessment is severely limited by motion. No subarachnoid space debris is evident on FLAIR or diffusion weighted imaging. Vascular: Major intracranial vascular flow voids are preserved. Skull and upper cervical spine: Unremarkable bone marrow signal. Sinuses/Orbits: Unremarkable orbits. Paranasal sinuses and mastoid air cells are clear. Other: None. IMPRESSION: 1. Severely motion degraded examination without evidence of acute intracranial abnormality. 2. Mild-to-moderate chronic small vessel ischemic disease. Electronically Signed   By: Logan Bores M.D.   On: 11/03/2021 10:43   MR Cervical Spine  W or Wo Contrast  Result Date: 11/03/2021 CLINICAL DATA:  Retropharyngeal edema. Meningitis/CNS infection suspected. EXAM: MRI CERVICAL, THORACIC AND LUMBAR SPINE WITHOUT AND WITH CONTRAST TECHNIQUE: Multiplanar and multiecho pulse sequences of the cervical spine, to include the craniocervical junction and cervicothoracic junction, and thoracic and lumbar spine, were obtained without and with intravenous contrast. CONTRAST:  87mL GADAVIST GADOBUTROL 1 MMOL/ML IV SOLN COMPARISON:  Retropharyngeal  edema. FINDINGS: MRI CERVICAL SPINE FINDINGS Alignment: No significant listhesis is present. Mild straightening of the normal cervical lordosis is noted. Vertebrae: Chronic fatty endplate marrow changes are noted from C3-4 through C7-T1. Vertebral body heights are maintained. Cord: Normal signal and morphology. Posterior Fossa, vertebral arteries, paraspinal tissues: Prevertebral edema extends from the skull base through C5-6. Peripheral postcontrast enhancement of the collection is noted. Postcontrast images are moderately degraded by patient motion. No definite intracanalicular enhancement is present in the cervical spine. Disc levels: Axial images were not obtained in the cervical spine. Multilevel disc disease is present in the cervical spine with effacement of the ventral CSF at C3-4, C4-5, C5-6, and C6-7. Foraminal disease is greatest at C5-6 on the left and C4-5 on the right. MRI THORACIC SPINE FINDINGS Alignment: No significant listhesis is present in the thoracic spine. Straightening of the normal kyphosis is noted. Vertebrae: Fatty endplate marrow changes present at T6-7. Marrow signal and vertebral body heights normal. Cord: Normal signal and morphology. No significant intracanalicular enhancement is present in the thoracic spine. Paraspinal and other soft tissues: Limited imaging the abdomen is unremarkable. There is no significant adenopathy. No solid organ lesions are present. Disc levels: Mild disc bulging is present at T5-6 and T6-7 without significant stenosis. Mild foraminal narrowing is present at T7-8, T8-9 and T9-10, right greater than left, secondary to facet spurring. MRI LUMBAR SPINE FINDINGS Segmentation: 5 non rib-bearing lumbar type vertebral bodies are present. The lowest fully formed vertebral body is L5. Alignment: No significant listhesis is present. Straightening of the normal lumbar lordosis is noted. Vertebrae: Study is moderately degraded by patient motion. Chronic fatty endplate  marrow changes are noted anteriorly at L3-4. Edematous changes are present anteriorly at L4-5 with some enhancement of the endplates and fluid in the disc space at L4-5. Conus medullaris and cauda equina: Conus extends to the L1 level. Conus and cauda equina appear normal. Paraspinal and other soft tissues: Limited imaging the abdomen is unremarkable. There is no significant adenopathy. No solid organ lesions are present. Disc levels: T12-L1: Negative. L1-2: Negative. L2-3: Mild epidural enhancement is noted, anteriorly more posteriorly. Broad-based disc protrusion extends into the foramina bilaterally with moderate foraminal narrowing bilaterally. L3-4: Broad-based disc protrusion is present. Moderate facet hypertrophy is noted bilaterally. Moderate central bilateral foraminal stenosis is present. Diffuse epidural enhancement is present. No discrete epidural abscess is present. L4-5: Fluid is present in the disc space. Enhancement is present disc space and endplates. Extensive epidural enhancement is present. Central and foraminal stenosis present. L5-S1: Loss of disc height is present. Diffuse epidural enhancement present. Central disc protrusion contributes to mild central canal stenosis. Facet spurring contributes to mild foraminal narrowing bilaterally. IMPRESSION: 1. Fluid and enhancement within the disc space at L4-5 with adjacent edema and enhancement of the endplates. 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 2. Extensive epidural enhancement in the lower lumbar spine L2-3 through the sacrum compatible with infection. Recommend lumbar puncture further evaluation. 3. Prevertebral edema with peripheral postcontrast enhancement extending from the skull base through C5-6. Focal source for infection in the upper cervical spine not  identified. 4. The study is moderately degraded by patient motion. 5. 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 6. Diffuse epidural enhancement at L2-3,  L3-4, L4-5, and L5-S1 without discrete epidural abscess. 7. Moderate central canal and bilateral foraminal stenosis at L3-4. 8. Mild foraminal narrowing bilaterally at T7-8, T8-9 and T9-10 secondary to facet spurring. These results will be called to the ordering clinician or representative by the Radiologist Assistant, and communication documented in the PACS or [employer]. Electronically Signed   By: San Morelle M.D.   On: 11/03/2021 10:55   MR THORACIC SPINE W WO CONTRAST  Result Date: 11/03/2021 CLINICAL DATA:  Retropharyngeal edema. Meningitis/CNS infection suspected. EXAM: MRI CERVICAL, THORACIC AND LUMBAR SPINE WITHOUT AND WITH CONTRAST TECHNIQUE: Multiplanar and multiecho pulse sequences of the cervical spine, to include the craniocervical junction and cervicothoracic junction, and thoracic and lumbar spine, were obtained without and with intravenous contrast. CONTRAST:  42mL GADAVIST GADOBUTROL 1 MMOL/ML IV SOLN COMPARISON:  Retropharyngeal edema. FINDINGS: MRI CERVICAL SPINE FINDINGS Alignment: No significant listhesis is present. Mild straightening of the normal cervical lordosis is noted. Vertebrae: Chronic fatty endplate marrow changes are noted from C3-4 through C7-T1. Vertebral body heights are maintained. Cord: Normal signal and morphology. Posterior Fossa, vertebral arteries, paraspinal tissues: Prevertebral edema extends from the skull base through C5-6. Peripheral postcontrast enhancement of the collection is noted. Postcontrast images are moderately degraded by patient motion. No definite intracanalicular enhancement is present in the cervical spine. Disc levels: Axial images were not obtained in the cervical spine. Multilevel disc disease is present in the cervical spine with effacement of the ventral CSF at C3-4, C4-5, C5-6, and C6-7. Foraminal disease is greatest at C5-6 on the left and C4-5 on the right. MRI THORACIC SPINE FINDINGS Alignment: No significant listhesis is  present in the thoracic spine. Straightening of the normal kyphosis is noted. Vertebrae: Fatty endplate marrow changes present at T6-7. Marrow signal and vertebral body heights normal. Cord: Normal signal and morphology. No significant intracanalicular enhancement is present in the thoracic spine. Paraspinal and other soft tissues: Limited imaging the abdomen is unremarkable. There is no significant adenopathy. No solid organ lesions are present. Disc levels: Mild disc bulging is present at T5-6 and T6-7 without significant stenosis. Mild foraminal narrowing is present at T7-8, T8-9 and T9-10, right greater than left, secondary to facet spurring. MRI LUMBAR SPINE FINDINGS Segmentation: 5 non rib-bearing lumbar type vertebral bodies are present. The lowest fully formed vertebral body is L5. Alignment: No significant listhesis is present. Straightening of the normal lumbar lordosis is noted. Vertebrae: Study is moderately degraded by patient motion. Chronic fatty endplate marrow changes are noted anteriorly at L3-4. Edematous changes are present anteriorly at L4-5 with some enhancement of the endplates and fluid in the disc space at L4-5. Conus medullaris and cauda equina: Conus extends to the L1 level. Conus and cauda equina appear normal. Paraspinal and other soft tissues: Limited imaging the abdomen is unremarkable. There is no significant adenopathy. No solid organ lesions are present. Disc levels: T12-L1: Negative. L1-2: Negative. L2-3: Mild epidural enhancement is noted, anteriorly more posteriorly. Broad-based disc protrusion extends into the foramina bilaterally with moderate foraminal narrowing bilaterally. L3-4: Broad-based disc protrusion is present. Moderate facet hypertrophy is noted bilaterally. Moderate central bilateral foraminal stenosis is present. Diffuse epidural enhancement is present. No discrete epidural abscess is present. L4-5: Fluid is present in the disc space. Enhancement is present disc  space and endplates. Extensive epidural enhancement is present. Central and  foraminal stenosis present. L5-S1: Loss of disc height is present. Diffuse epidural enhancement present. Central disc protrusion contributes to mild central canal stenosis. Facet spurring contributes to mild foraminal narrowing bilaterally. IMPRESSION: 1. Fluid and enhancement within the disc space at L4-5 with adjacent edema and enhancement of the endplates. 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 2. Extensive epidural enhancement in the lower lumbar spine L2-3 through the sacrum compatible with infection. Recommend lumbar puncture further evaluation. 3. Prevertebral edema with peripheral postcontrast enhancement extending from the skull base through C5-6. Focal source for infection in the upper cervical spine not identified. 4. The study is moderately degraded by patient motion. 5. 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 6. Diffuse epidural enhancement at L2-3, L3-4, L4-5, and L5-S1 without discrete epidural abscess. 7. Moderate central canal and bilateral foraminal stenosis at L3-4. 8. Mild foraminal narrowing bilaterally at T7-8, T8-9 and T9-10 secondary to facet spurring. These results will be called to the ordering clinician or representative by the Radiologist Assistant, and communication documented in the PACS or [employer]. Electronically Signed   By: San Morelle M.D.   On: 11/03/2021 10:55   MR Lumbar Spine W Wo Contrast  Result Date: 11/03/2021 CLINICAL DATA:  Retropharyngeal edema. Meningitis/CNS infection suspected. EXAM: MRI CERVICAL, THORACIC AND LUMBAR SPINE WITHOUT AND WITH CONTRAST TECHNIQUE: Multiplanar and multiecho pulse sequences of the cervical spine, to include the craniocervical junction and cervicothoracic junction, and thoracic and lumbar spine, were obtained without and with intravenous contrast. CONTRAST:  65mL GADAVIST GADOBUTROL 1 MMOL/ML IV SOLN COMPARISON:   Retropharyngeal edema. FINDINGS: MRI CERVICAL SPINE FINDINGS Alignment: No significant listhesis is present. Mild straightening of the normal cervical lordosis is noted. Vertebrae: Chronic fatty endplate marrow changes are noted from C3-4 through C7-T1. Vertebral body heights are maintained. Cord: Normal signal and morphology. Posterior Fossa, vertebral arteries, paraspinal tissues: Prevertebral edema extends from the skull base through C5-6. Peripheral postcontrast enhancement of the collection is noted. Postcontrast images are moderately degraded by patient motion. No definite intracanalicular enhancement is present in the cervical spine. Disc levels: Axial images were not obtained in the cervical spine. Multilevel disc disease is present in the cervical spine with effacement of the ventral CSF at C3-4, C4-5, C5-6, and C6-7. Foraminal disease is greatest at C5-6 on the left and C4-5 on the right. MRI THORACIC SPINE FINDINGS Alignment: No significant listhesis is present in the thoracic spine. Straightening of the normal kyphosis is noted. Vertebrae: Fatty endplate marrow changes present at T6-7. Marrow signal and vertebral body heights normal. Cord: Normal signal and morphology. No significant intracanalicular enhancement is present in the thoracic spine. Paraspinal and other soft tissues: Limited imaging the abdomen is unremarkable. There is no significant adenopathy. No solid organ lesions are present. Disc levels: Mild disc bulging is present at T5-6 and T6-7 without significant stenosis. Mild foraminal narrowing is present at T7-8, T8-9 and T9-10, right greater than left, secondary to facet spurring. MRI LUMBAR SPINE FINDINGS Segmentation: 5 non rib-bearing lumbar type vertebral bodies are present. The lowest fully formed vertebral body is L5. Alignment: No significant listhesis is present. Straightening of the normal lumbar lordosis is noted. Vertebrae: Study is moderately degraded by patient motion. Chronic  fatty endplate marrow changes are noted anteriorly at L3-4. Edematous changes are present anteriorly at L4-5 with some enhancement of the endplates and fluid in the disc space at L4-5. Conus medullaris and cauda equina: Conus extends to the L1 level. Conus and cauda equina appear normal. Paraspinal and other  soft tissues: Limited imaging the abdomen is unremarkable. There is no significant adenopathy. No solid organ lesions are present. Disc levels: T12-L1: Negative. L1-2: Negative. L2-3: Mild epidural enhancement is noted, anteriorly more posteriorly. Broad-based disc protrusion extends into the foramina bilaterally with moderate foraminal narrowing bilaterally. L3-4: Broad-based disc protrusion is present. Moderate facet hypertrophy is noted bilaterally. Moderate central bilateral foraminal stenosis is present. Diffuse epidural enhancement is present. No discrete epidural abscess is present. L4-5: Fluid is present in the disc space. Enhancement is present disc space and endplates. Extensive epidural enhancement is present. Central and foraminal stenosis present. L5-S1: Loss of disc height is present. Diffuse epidural enhancement present. Central disc protrusion contributes to mild central canal stenosis. Facet spurring contributes to mild foraminal narrowing bilaterally. IMPRESSION: 1. Fluid and enhancement within the disc space at L4-5 with adjacent edema and enhancement of the endplates. 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 2. Extensive epidural enhancement in the lower lumbar spine L2-3 through the sacrum compatible with infection. Recommend lumbar puncture further evaluation. 3. Prevertebral edema with peripheral postcontrast enhancement extending from the skull base through C5-6. Focal source for infection in the upper cervical spine not identified. 4. The study is moderately degraded by patient motion. 5. 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 6. Diffuse epidural  enhancement at L2-3, L3-4, L4-5, and L5-S1 without discrete epidural abscess. 7. Moderate central canal and bilateral foraminal stenosis at L3-4. 8. Mild foraminal narrowing bilaterally at T7-8, T8-9 and T9-10 secondary to facet spurring. These results will be called to the ordering clinician or representative by the Radiologist Assistant, and communication documented in the PACS or [employer]. Electronically Signed   By: San Morelle M.D.   On: 11/03/2021 10:55   DG Chest Portable 1 View  Result Date: 11/02/2021 CLINICAL DATA:  Cough EXAM: PORTABLE CHEST 1 VIEW COMPARISON:  Chest x-ray dated October 26, 2021 FINDINGS: Cardiac and mediastinal contours within normal limits. Slightly decreased bilateral patchy airspace disease. No large pleural effusion or pneumothorax. IMPRESSION: Bilateral patchy airspace disease is likely slightly decreased compared with prior exam, although evaluation is somewhat limited due to differences in technique. Recommend follow-up PA and lateral chest x-ray in 6-8 weeks to ensure complete resolution. Electronically Signed   By: Yetta Glassman M.D.   On: 11/02/2021 09:43   ECHOCARDIOGRAM COMPLETE  Result Date: 11/03/2021    ECHOCARDIOGRAM REPORT   Patient Name:   Charles Weiss. Date of Exam: 11/03/2021 Medical Rec #:  [id number]                  Height:       72.0 in Accession #:    [id number]                 Weight:       250.0 lb Date of Birth:  [birth year]                   BSA:          2.343 m Patient Age:    69 years                   BP:           165/107 mmHg Patient Gender: M                          HR:           108 bpm. Exam Location:  Inpatient Procedure: 2D Echo, Cardiac Doppler, Color Doppler and Intracardiac            Opacification Agent Indications:    Bacteremia  History:        Patient has no prior history of Echocardiogram examinations.                 Signs/Symptoms:Bacteremia, Shortness of Breath and Dyspnea.                  Hypoxia. FLU.  Sonographer:    Roseanna Rainbow RDCS Referring Phys: [id number] [facility] T [facility]  Sonographer Comments: Technically difficult study due to poor echo windows and patient is morbidly obese. Image acquisition challenging due to patient body habitus. IMPRESSIONS  1. Left ventricular ejection fraction, by estimation, is 55 to 60%. The left ventricle has normal function. The left ventricle has no regional wall motion abnormalities. There is mild concentric left ventricular hypertrophy. Left ventricular diastolic parameters are consistent with Grade I diastolic dysfunction (impaired relaxation).  2. Right ventricular systolic function is normal. The right ventricular size is normal.  3. The mitral valve is normal in structure. No evidence of mitral valve regurgitation. No evidence of mitral stenosis.  4. The aortic valve is normal in structure. Aortic valve regurgitation is not visualized. No aortic stenosis is present.  5. There is borderline dilatation of the ascending aorta, measuring 36 mm.  6. The inferior vena cava is normal in size with greater than 50% respiratory variability, suggesting right atrial pressure of 3 mmHg. Comparison(s): No prior Echocardiogram. FINDINGS  Left Ventricle: Left ventricular ejection fraction, by estimation, is 55 to 60%. The left ventricle has normal function. The left ventricle has no regional wall motion abnormalities. Definity contrast agent was given IV to delineate the left ventricular  endocardial borders. The left ventricular internal cavity size was normal in size. There is mild concentric left ventricular hypertrophy. Left ventricular diastolic parameters are consistent with Grade I diastolic dysfunction (impaired relaxation). Right Ventricle: The right ventricular size is normal. No increase in right ventricular wall thickness. Right ventricular systolic function is normal. Left Atrium: Left atrial size was normal in size. Right Atrium: Right atrial size was normal in size.  Pericardium: There is no evidence of pericardial effusion. Presence of epicardial fat layer. Mitral Valve: The mitral valve is normal in structure. No evidence of mitral valve regurgitation. No evidence of mitral valve stenosis. MV peak gradient, 11.7 mmHg. The mean mitral valve gradient is 5.0 mmHg. Tricuspid Valve: The tricuspid valve is normal in structure. Tricuspid valve regurgitation is not demonstrated. No evidence of tricuspid stenosis. Aortic Valve: The aortic valve is normal in structure. Aortic valve regurgitation is not visualized. No aortic stenosis is present. Pulmonic Valve: The pulmonic valve was normal in structure. Pulmonic valve regurgitation is not visualized. No evidence of pulmonic stenosis. Aorta: There is borderline dilatation of the ascending aorta, measuring 36 mm. Venous: The inferior vena cava is normal in size with greater than 50% respiratory variability, suggesting right atrial pressure of 3 mmHg. IAS/Shunts: No atrial level shunt detected by color flow Doppler.  LEFT VENTRICLE PLAX 2D LVIDd:         4.50 cm      Diastology LVIDs:         3.60 cm      LV e' medial:    19.00 cm/s LV PW:         1.07 cm      LV E/e' medial:  8.2 LV IVS:  1.04 cm      LV e' lateral:   17.90 cm/s LVOT diam:     2.10 cm      LV E/e' lateral: 8.7 LV SV:         95 LV SV Index:   40 LVOT Area:     3.46 cm  LV Volumes (MOD) LV vol d, MOD A2C: 104.2 ml LV vol d, MOD A4C: 112.0 ml LV vol s, MOD A2C: 45.8 ml LV vol s, MOD A4C: 54.9 ml LV SV MOD A2C:     58.3 ml LV SV MOD A4C:     112.0 ml LV SV MOD BP:      57.0 ml RIGHT VENTRICLE            IVC RV S prime:     9.36 cm/s  IVC diam: 2.60 cm TAPSE (M-mode): 1.4 cm LEFT ATRIUM             Index        RIGHT ATRIUM           Index LA diam:        3.20 cm 1.37 cm/m   RA Area:     11.50 cm LA Vol (A2C):   73.4 ml 31.33 ml/m  RA Volume:   24.70 ml  10.54 ml/m LA Vol (A4C):   26.5 ml 11.31 ml/m LA Biplane Vol: 45.0 ml 19.21 ml/m  AORTIC VALVE LVOT Vmax:    141.00 cm/s LVOT Vmean:  94.600 cm/s LVOT VTI:    0.273 m  AORTA Ao Root diam: 3.80 cm Ao Asc diam:  3.60 cm MITRAL VALVE MV Area (PHT): 3.60 cm     SHUNTS MV Area VTI:   3.25 cm     Systemic VTI:  0.27 m MV Peak grad:  11.7 mmHg    Systemic Diam: 2.10 cm MV Mean grad:  5.0 mmHg MV Vmax:       1.71 m/s MV Vmean:      101.0 cm/s MV Decel Time: 211 msec MV E velocity: 155.00 cm/s Charles Tobb DO Electronically signed by Berniece Salines DO Signature Date/Time: 11/03/2021/12:08:04 PM    Final         Scheduled Meds:  amLODipine  5 mg Oral Daily   atorvastatin  20 mg Oral Daily   losartan  50 mg Oral Daily   mometasone-formoterol  2 puff Inhalation BID   sodium chloride flush  3 mL Intravenous Q12H   Continuous Infusions:  sodium chloride 10 mL/hr at 11/03/21 1032   lactated ringers 100 mL/hr at 11/03/21 1552   vancomycin 1,000 mg (11/03/21 1033)     LOS: 1 day    Time spent: 9mins    Kathie Dike, MD [facility]   If 7PM-7AM, please contact night-coverage [email or web address]  11/03/2021, 7:36 PM

## 2021-11-03 NOTE — Progress Notes (Incomplete)
°  Echocardiogram 2D Echocardiogram has been performed.  Bobbye Charleston 11/03/2021, 11:09 AM

## 2021-11-03 NOTE — Progress Notes (Signed)
Notified to [facility] MD about delayed MRI. Will continue pain management with pain medication

## 2021-11-03 NOTE — Progress Notes (Signed)
PHARMACY - PHYSICIAN COMMUNICATION CRITICAL VALUE ALERT - BLOOD CULTURE IDENTIFICATION (BCID)  Charles Weiss. is an 70 y.o. male who presented to [facility] on 11/02/2021 with a chief complaint of neck pain & headache.  Assessment:  BCID+MRSA  Name of physician (or Provider) Contacted: Hal Hope  Current antibiotics: Cefepime + Vancomycin + Ampicillin + Acyclovir  Changes to prescribed antibiotics recommended:  De-escalate abx regimen to Vancomycin monotherapy F/U ID recommendations Recommendations accepted by provider  Results for orders placed or performed during the hospital encounter of 11/02/21  Blood Culture ID Panel (Reflexed) (Collected: 11/02/2021 12:12 PM)  Result Value Ref Range   Enterococcus faecalis NOT DETECTED NOT DETECTED   Enterococcus Faecium NOT DETECTED NOT DETECTED   Listeria monocytogenes NOT DETECTED NOT DETECTED   Staphylococcus species DETECTED (A) NOT DETECTED   Staphylococcus aureus (BCID) DETECTED (A) NOT DETECTED   Staphylococcus epidermidis NOT DETECTED NOT DETECTED   Staphylococcus lugdunensis NOT DETECTED NOT DETECTED   Streptococcus species NOT DETECTED NOT DETECTED   Streptococcus agalactiae NOT DETECTED NOT DETECTED   Streptococcus pneumoniae NOT DETECTED NOT DETECTED   Streptococcus pyogenes NOT DETECTED NOT DETECTED   A.calcoaceticus-baumannii NOT DETECTED NOT DETECTED   Bacteroides fragilis NOT DETECTED NOT DETECTED   Enterobacterales NOT DETECTED NOT DETECTED   Enterobacter cloacae complex NOT DETECTED NOT DETECTED   Escherichia coli NOT DETECTED NOT DETECTED   Klebsiella aerogenes NOT DETECTED NOT DETECTED   Klebsiella oxytoca NOT DETECTED NOT DETECTED   Klebsiella pneumoniae NOT DETECTED NOT DETECTED   Proteus species NOT DETECTED NOT DETECTED   Salmonella species NOT DETECTED NOT DETECTED   Serratia marcescens NOT DETECTED NOT DETECTED   Haemophilus influenzae NOT DETECTED NOT DETECTED   Neisseria meningitidis NOT  DETECTED NOT DETECTED   Pseudomonas aeruginosa NOT DETECTED NOT DETECTED   Stenotrophomonas maltophilia NOT DETECTED NOT DETECTED   Candida albicans NOT DETECTED NOT DETECTED   Candida auris NOT DETECTED NOT DETECTED   Candida glabrata NOT DETECTED NOT DETECTED   Candida krusei NOT DETECTED NOT DETECTED   Candida parapsilosis NOT DETECTED NOT DETECTED   Candida tropicalis NOT DETECTED NOT DETECTED   Cryptococcus neoformans/gattii NOT DETECTED NOT DETECTED   Meth resistant mecA/C and MREJ DETECTED (A) NOT DETECTED    Netta Cedars PharmD 11/03/2021  5:56 AM

## 2021-11-03 NOTE — Consult Note (Signed)
NEUROLOGY CONSULTATION NOTE   Date of service: November 03, 2021 Patient Name: Charles Weiss. MRN:  [id number] DOB:  October 08, 1951 Reason for consult: "Fever + headache + Neck pain" Requesting Provider: Lenore Cordia, MD _ _ _   _ __   _ __ _ _  __ __   _ __   __ _  History of Present Illness  Charles Stapel Hillery Bhalla. is a 70 y.o. male with PMH significant for asthma, GERD, HLD, HTN, recent influenza Pneumonia who presented to the ED with headaches and neck pain and fever with Tmax of 103.4.  He was discharged from hospital last week for influenza leading to asthma exacerbation and hypoxic respiratory failure.  Reports headache and neck pain since discharge from hospital. Has worsened with pain in BL forehead, behind his ears, in the back of his neck. Pain worse with moving his neck and neck feels very stiff.  In the ED, he had CT Angio head and neck which demonstrated Retropharyngeal edema without visible pharyngeal and spina source. Case discussed with ENT, felt no drainable collection present.  He was started on decadron and empiric meningitis coverage.   ROS   Constitutional Denies weight loss, endorses fever and chills.  HEENT Denies changes in vision and hearing.  Respiratory Denies SOB but endorses cough.  CV Denies palpitations and CP  GI Denies abdominal pain, nausea, vomiting and diarrhea.  GU Denies dysuria and urinary frequency but endorses incontinence.  MSK Endorses myalgia and joint pain.  Skin Denies rash and pruritus.  Neurological Endorse headache but no syncope.   Psychiatric Denies recent changes in mood. Denies anxiety and depression.   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 No pertinent surgical  history. Family History  Problem Relation Age of Onset   Hypertension Mother    Skin cancer Mother    Osteoporosis Mother    Hypertension Father    Social History   Socioeconomic History   Marital status: Divorced    Spouse name: Not on file   Number of children: Not on file   Years of education: Not on file   Highest education level: Not on file  Occupational History   Not on file  Tobacco Use   Smoking status: Every Day    Types: Cigarettes   Smokeless tobacco: Never  Vaping Use   Vaping Use: Never used  Substance and Sexual Activity   Alcohol use: Never   Drug use: Never   Sexual activity: Not on file  Other Topics Concern   Not on file  Social History Narrative   Not on file   Social Determinants of Health   Financial Resource Strain: Not on file  Food Insecurity: Not on file  Transportation Needs: Not on file  Physical Activity: Not on file  Stress: Not on file  Social Connections: Not on file   Allergies  Allergen Reactions   Elavil [Amitriptyline] Other (See Comments)   Tetracyclines & Related Other (See Comments)    Medications   Medications Prior to Admission  Medication Sig Dispense Refill Last Dose   acetaminophen (TYLENOL) 650 MG CR tablet Take 650 mg by mouth every 8 (eight)  hours as needed for pain.      albuterol (PROVENTIL) (2.5 MG/3ML) 0.083% nebulizer solution Take 3 mLs by nebulization 3 (three) times daily. 100 mL 0    amLODipine (NORVASC) 5 MG tablet Take 5 mg by mouth daily.      aspirin 81 MG chewable tablet Chew by mouth daily.      benzonatate (TESSALON) 100 MG capsule Take 1 capsule (100 mg total) by mouth 3 (three) times daily. 20 capsule 0    chlorpheniramine-HYDROcodone (TUSSIONEX) 10-8 MG/5ML SUER Take 5 mLs by mouth every 12 (twelve) hours as needed for cough. 140 mL 0    Cholecalciferol (VITAMIN D3) 25 MCG (1000 UT) CAPS Take 1,000 Units by mouth daily.      Cyanocobalamin (B-12 PO) Take 1 tablet by mouth daily.       dextromethorphan (DELSYM) 30 MG/5ML liquid Take 5 mLs (30 mg total) by mouth 2 (two) times daily as needed for cough. 89 mL 0    dextromethorphan-guaiFENesin (MUCINEX DM) 30-600 MG 12hr tablet Take 1 tablet by mouth 2 (two) times daily. 60 tablet 0    docusate sodium (COLACE) 100 MG capsule Take 1 capsule (100 mg total) by mouth 2 (two) times daily. 60 capsule 0    gabapentin (NEURONTIN) 300 MG capsule Take 1 tablet (300 mg) in the morning and 2 tablets (600 mg) before bed. (Patient taking differently: Take 300-600 mg by mouth See admin instructions. Take 300 mg in the morning and afternoon and 600 mg before bed.) 90 capsule 0    ketotifen (ZADITOR) 0.025 % ophthalmic solution Place 1 drop into both eyes 2 (two) times daily as needed (allergies).      losartan (COZAAR) 50 MG tablet Take 50 mg by mouth daily.      omeprazole (PRILOSEC) 20 MG capsule Take 20 mg by mouth daily.      polyethylene glycol (MIRALAX / GLYCOLAX) 17 g packet Take 17 g by mouth daily. 14 each 0    predniSONE (DELTASONE) 10 MG tablet Take 50mg  daily for 3days,Take 40mg  daily for 3days,Take 30mg  daily for 3days,Take 20mg  daily for 3days,Take 10mg  daily for 3days, then stop 45 tablet 0    Pyridoxine HCl (B-6 PO) Take 1 tablet by mouth daily.      simvastatin (ZOCOR) 40 MG tablet Take 40 mg by mouth daily.      tizanidine (ZANAFLEX) 2 MG capsule Take 1 capsule (2 mg total) by mouth at bedtime as needed for muscle spasms. (Patient not taking: Reported on 10/24/2021) 10 capsule 0    WIXELA INHUB 250-50 MCG/ACT AEPB Inhale 1 puff into the lungs 2 (two) times daily.        Vitals   Vitals:   11/02/21 2101 11/02/21 2322 11/03/21 0115 11/03/21 0255  BP: 128/78 140/83 131/79 (!) 156/95  Pulse: (!) 108 100 96 (!) 106  Resp: 18  20 20   Temp: 97.9 F (36.6 C) 97.8 F (36.6 C) 98.6 F (37 C) 98.3 F (36.8 C)  TempSrc: Oral Oral Oral Oral  SpO2: 93% 96% 98% 97%  Weight:      Height:         Body mass index is 33.91  kg/m.  Physical Exam   General: Laying comfortably in bed; in no acute distress.  HENT: Normal oropharynx and mucosa. Normal external appearance of ears and nose.  Neck: Supple, no pain or tenderness  CV: No JVD. No peripheral edema.  Pulmonary: Symmetric Chest rise. Normal respiratory effort.  Abdomen:  Soft to touch, non-tender.  Ext: No cyanosis, edema, or deformity  Skin: No rash. Normal palpation of skin.   Musculoskeletal: Normal digits and nails by inspection. No clubbing.   Neurologic Examination  Mental status/Cognition: Alert, oriented to self, place, month but not year, poor attention and easily distracted.  Speech/language: Fluent, comprehension intact, object naming intact, repetition intact. Cranial nerves:   CN II Pupils equal and reactive to light, no VF deficits    CN III,IV,VI EOM intact, no gaze preference or deviation, no nystagmus    CN V normal sensation in V1, V2, and V3 segments bilaterally    CN VII no asymmetry, no nasolabial fold flattening    CN VIII normal hearing to speech    CN IX & X normal palatal elevation, no uvular deviation    CN XI 5/5 head turn and 5/5 shoulder shrug bilaterally    CN XII midline tongue protrusion    Motor:  Muscle bulk: normal, tone normal Limited motor exam 2/2 neck and back pain. Unable to assess Kernigs or Brudzinskis signs due to pain.  Mvmt Root Nerve  Muscle Right Left Comments  SA C5/6 Ax Deltoid   Unable to assess 2/2 pain  EF C5/6 Mc Biceps 5 5   EE C6/7/8 Rad Triceps 5 5   WF C6/7 Med FCR     WE C7/8 PIN ECU     F Ab C8/T1 U ADM/FDI 5 5   HF L1/2/3 Fem Illopsoas   Unable to assess 2/2 pain  KE L2/3/4 Fem Quad 3 3 Unable to assess full strength 2/2 pain  DF L4/5 D Peron Tib Ant 5 5   PF S1/2 Tibial Grc/Sol 5 5    Reflexes:  Right Left Comments  Pectoralis      Biceps (C5/6) 2 2   Brachioradialis (C5/6) 2 2    Triceps (C6/7) 2 2    Patellar (L3/4) 2+ 2+    Achilles (S1) 2 2    Hoffman      Plantar  mute mute   Jaw jerk    Sensation:  Light touch Intact throughout   Pin prick    Temperature    Vibration   Proprioception    Coordination/Complex Motor:  - Finger to Nose intact BL - Heel to shin unable to do due to pain - Rapid alternating movement are intact - Gait: unable to assess 2/2 pain.  Labs   CBC:  Recent Labs  Lab 11/02/21 0859 11/03/21 0440  WBC 19.4* 30.0*  NEUTROABS 17.4*  --   HGB 13.1 11.5*  HCT 37.4* 33.0*  MCV 89.3 88.7  PLT 292 789    Basic Metabolic Panel:  Lab Results  Component Value Date   NA 132 (L) 11/02/2021   K 3.7 11/02/2021   CO2 27 11/02/2021   GLUCOSE 153 (H) 11/02/2021   BUN 29 (H) 11/02/2021   CREATININE 0.92 11/02/2021   CALCIUM 8.9 11/02/2021   GFRNONAA >60 11/02/2021   Lipid Panel: No results found for: LDLCALC HgbA1c: No results found for: HGBA1C Urine Drug Screen: No results found for: LABOPIA, COCAINSCRNUR, LABBENZ, AMPHETMU, THCU, LABBARB  Alcohol Level No results found for: [facility]  CT angio Head and Neck with and without contrast(Personally reviewed): Personally reviewed with no acute intracranial abnormality. Notable for subtle Retropharyngeal edema that is hard for me to appreciate.   MRI Brain, C, T, L spine w + w/o C: pending Impression   Charles Falconi Shawnmichael Parenteau. is a 70 y.o. male  with PMH significant for with PMH significant for asthma, GERD, HLD, HTN, recent influenza Pneumonia who presented to the ED with headaches and neck pain and fever with Tmax of 103.4. In addition, endorses mid and low back pain to me. Symptoms concerning for potential discitis/osteomyelitis of his vertebral column with concern for an epidural abscess. His strength is limited in proximal musculature due to pain which makes it challenging to rule out cord compression but appears intact in his hands and feet. He feel a little numb in his genital region with some trouble sensing when he has to urinate which has me somewhat concerned about a cord  compression. Agree with starting out with MRI Brain, C, T, L spine with and without contrast. He does not appear floridly encephalopathic as would be expected in case of meningitis/encephalitis but still remains a concern.   I would recommend making a decision on pursuing LP based on the MRI results. Would recommend holdingg off if he has an abscess close to ar at the site of potential LP.  Recommendations  - Continue Vanc, Cefepime, Ampicillin. Can narrow based on cultures. - Discontinue Acyclovir. His presentation is not consistent with HSV encephalitis. - He is being taken for MRI Brain, C, T, L spine with and without contrast. - I would recommend making a decision on pursuing LP based on the MRI results. Would recommend holdingg off if he has an abscess close to ar at the site of potential LP. - If LP is pursued, recommend cell count and differential x 2, protein, glucose, CSF gram stain and cultures. - Neurology will continue to follow. ______________________________________________________________________  Discussed with Dr. Roderic Palau.  Thank you for the opportunity to take part in the care of this patient. If you have any further questions, please contact the neurology consultation attending.  Signed,  [place] Pager Number [phone] _ _ _   _ __   _ __ _ _  __ __   _ __   __ _

## 2021-11-03 NOTE — Progress Notes (Signed)
MRI reviewed, he has discitis and possible epidural fluid collection in the lumbar area.  At this point, I do not feel that an LP would necessarily add given that we already have positive blood cultures.  Will defer to infectious disease if LP is needed.  Treatment at this time would include antibiotics, defer further antibiotic choice to infectious disease.  Neurology will continue to be available on an as-needed basis, please call if there remains any further questions.  Roland Rack, MD [facility] [phone]  If 7pm- 7am, please page neurology on call as listed in [place].

## 2021-11-04 DIAGNOSIS — J45909 Unspecified asthma, uncomplicated: Secondary | ICD-10-CM | POA: Diagnosis not present

## 2021-11-04 DIAGNOSIS — J390 Retropharyngeal and parapharyngeal abscess: Secondary | ICD-10-CM | POA: Diagnosis not present

## 2021-11-04 LAB — BASIC METABOLIC PANEL
Anion gap: 9 (ref 5–15)
BUN: 26 mg/dL — ABNORMAL HIGH (ref 8–23)
CO2: 25 mmol/L (ref 22–32)
Calcium: 8.7 mg/dL — ABNORMAL LOW (ref 8.9–10.3)
Chloride: 97 mmol/L — ABNORMAL LOW (ref 98–111)
Creatinine, Ser: 0.76 mg/dL (ref 0.61–1.24)
GFR, Estimated: >60 mL/min (ref >60)
Glucose, Bld: 175 mg/dL — ABNORMAL HIGH (ref 70–99)
Potassium: 3.9 mmol/L (ref 3.5–5.1)
Sodium: 131 mmol/L — ABNORMAL LOW (ref 135–145)

## 2021-11-04 LAB — CBC
HCT: 32.2 % — ABNORMAL LOW (ref 39.0–52.0)
Hemoglobin: 11.2 g/dL — ABNORMAL LOW (ref 13.0–17.0)
MCH: 31.1 pg (ref 26.0–34.0)
MCHC: 34.8 g/dL (ref 30.0–36.0)
MCV: 89.4 fL (ref 80.0–100.0)
Platelets: 231 10*3/uL (ref 150–400)
RBC: 3.6 MIL/uL — ABNORMAL LOW (ref 4.22–5.81)
RDW: 14.6 % (ref 11.5–15.5)
WBC: 24 10*3/uL — ABNORMAL HIGH (ref 4.0–10.5)
nRBC: 0 % (ref 0.0–0.2)

## 2021-11-04 MED ORDER — OXYCODONE-ACETAMINOPHEN 5-325 MG PO TABS
1.0000 | ORAL_TABLET | ORAL | Status: DC | PRN
Start: 2021-11-04 — End: 2021-11-14
  Administered 2021-11-04: 1 via ORAL
  Administered 2021-11-05 – 2021-11-06 (×3): 2 via ORAL
  Administered 2021-11-06 – 2021-11-07 (×2): 1 via ORAL
  Administered 2021-11-07 – 2021-11-13 (×9): 2 via ORAL
  Filled 2021-11-04: qty 1
  Filled 2021-11-04 (×6): qty 2
  Filled 2021-11-04 (×4): qty 1
  Filled 2021-11-04 (×5): qty 2

## 2021-11-04 MED ORDER — ENOXAPARIN SODIUM 60 MG/0.6ML IJ SOSY
60.0000 mg | PREFILLED_SYRINGE | INTRAMUSCULAR | Status: DC
  Administered 2021-11-04 – 2021-11-24 (×21): 60 mg via SUBCUTANEOUS
  Filled 2021-11-04 (×21): qty 0.6

## 2021-11-04 NOTE — Progress Notes (Signed)
[facility]  Date of Admission:  11/02/2021      Abx: 11/13-c vanc                                                        Assessment: 70 yo male with asthma, gerd, htn/hlp, recent influenza pna admitted to ED 11/13 with neck pain found to have sepsis and mrsa bsi along with diffuse spinal osseous involvement and retropharyngeal space edema    Mri spine diffuse lumbar epidural enhancement without abscess. L4-5 OM changes. C5-6 prevertebral enhancement no osseous finding   No neurologic deficit. No ams or clinical concern of meningitis at this time   Doesn't appear to be a superimposed mrsa bacterial pna. Unclear source   11/13 bcx mrsa   No hardware/cardiac device  --------- 11/15 assessment Appears somewhat more confused today, but nonfocal on neurological exam otherwise 11/14 bcx repeat in progress 11/14 mri brain difficult but no obvious abscess 11/14 tte technically difficult study, but no obvious vegetation. Deferring tee unless recurrent bacteremia beyond 3 days    Plan: F/u repeat bcx from 11/14 Continue vancomycin; will check daptomycin susceptibility (requested 11/15) If confusion worse/persists, consider repeat brain mri in 3-4 days, r/o septic emboli/brain abscess as that potentially could change antimicrobial management If persistent bacteremia >3 days from admission, will need tee and abd/pelv imaging Discussed with primary care   Principal Problem:   Sepsis ([place]) Active Problems:   Hypertension   Hyperlipidemia   Asthma   Acute respiratory failure with hypoxia ([place])   Vertebral osteomyelitis ([place])   MRSA bacteremia   Retropharyngeal abscess   Allergies  Allergen Reactions   Elavil [Amitriptyline] Other (See Comments)   Tetracyclines & Related Other (See Comments)    Scheduled Meds:  amLODipine  5 mg Oral Daily   atorvastatin  20 mg Oral Daily   enoxaparin (LOVENOX) injection  60 mg Subcutaneous Q24H    losartan  50 mg Oral Daily   mometasone-formoterol  2 puff Inhalation BID   sodium chloride flush  3 mL Intravenous Q12H   Continuous Infusions:  sodium chloride 10 mL/hr at 11/03/21 1032   lactated ringers 100 mL/hr at 11/04/21 1412   vancomycin 1,000 mg (11/04/21 1006)   PRN Meds:.sodium chloride, acetaminophen **OR** acetaminophen, albuterol, LORazepam, morphine injection, naLOXone (NARCAN)  injection, ondansetron **OR** ondansetron (ZOFRAN) IV, oxyCODONE-acetaminophen, senna-docusate   SUBJECTIVE: Confused Complaint of lower back pain/neck pain No n/v/diarrhea No dyspnea No other joint pain Fever resolved Wbc improving  Review of Systems: ROS All other ROS was negative, except mentioned above     OBJECTIVE: Vitals:   11/04/21 0342 11/04/21 0953 11/04/21 0955 11/04/21 1203  BP: (!) 168/92 (!) 183/107 (!) 183/107 (!) 150/86  Pulse: 95  94 (!) 101  Resp: 16  20 18   Temp: 97.9 F (36.6 C)  99 F (37.2 C) 98.6 F (37 C)  TempSrc: Oral  Oral Oral  SpO2: 95%  97% 96%  Weight:      Height:       Body mass index is 33.91 kg/m.  Physical Exam  General/constitutional: a little agitated, pushing off blanket; somewhat cooperative/interactive with exam HEENT: Normocephalic, PER, Conj Clear, EOMI CV: rrr no mrg Lungs: clear to auscultation, normal respiratory effort Abd: Soft, Nontender Ext:  no edema Skin: No Rash Neuro: nonfocal outside of confusion MSK: no peripheral joint swelling/tenderness/warmth  Central line presence: no  Lab Results Lab Results  Component Value Date   WBC 24.0 (H) 11/04/2021   HGB 11.2 (L) 11/04/2021   HCT 32.2 (L) 11/04/2021   MCV 89.4 11/04/2021   PLT 231 11/04/2021    Lab Results  Component Value Date   CREATININE 0.76 11/04/2021   BUN 26 (H) 11/04/2021   NA 131 (L) 11/04/2021   K 3.9 11/04/2021   CL 97 (L) 11/04/2021   CO2 25 11/04/2021    Lab Results  Component Value Date   ALT 30 11/03/2021   AST 19 11/03/2021    ALKPHOS 68 11/03/2021   BILITOT 1.1 11/03/2021      Microbiology: Recent Results (from the past 240 hour(s))  Blood culture (routine x 2)     Status: Abnormal (Preliminary result)   Collection Time: 11/02/21 12:12 PM   Specimen: BLOOD  Result Value Ref Range Status   Specimen Description   Final    BLOOD RIGHT ANTECUBITAL Performed at [facility], [place] [address], [place], [place] [address]    Special Requests   Final    BOTTLES DRAWN AEROBIC AND ANAEROBIC Blood Culture results may not be optimal due to an excessive volume of blood received in culture bottles Performed at [facility] [address] [place], [place] [address]    Culture  Setup Time   Final    GRAM POSITIVE COCCI IN CLUSTERS IN BOTH AEROBIC AND ANAEROBIC BOTTLES CRITICAL RESULT CALLED TO, READ BACK BY AND VERIFIED WITH: M LILLISTON,PHARMD@[id number]  11/03/21 [place]    Culture (A)  Final    STAPHYLOCOCCUS AUREUS SUSCEPTIBILITIES TO FOLLOW Performed at [facility], [place] [address] [place], [place] [address]    Report Status PENDING  Incomplete  Blood Culture ID Panel (Reflexed)     Status: Abnormal   Collection Time: 11/02/21 12:12 PM  Result Value Ref Range Status   Enterococcus faecalis NOT DETECTED NOT DETECTED Final   Enterococcus Faecium NOT DETECTED NOT DETECTED Final   Listeria monocytogenes NOT DETECTED NOT DETECTED Final   Staphylococcus species DETECTED (A) NOT DETECTED Final    Comment: CRITICAL RESULT CALLED TO, READ BACK BY AND VERIFIED WITH: M LILLISTON,PHARMD@0431  11/03/21 [place]    Staphylococcus aureus (BCID) DETECTED (A) NOT DETECTED Final    Comment: Methicillin (oxacillin)-resistant Staphylococcus aureus (MRSA). MRSA is predictably resistant to beta-lactam antibiotics (except ceftaroline). Preferred therapy is vancomycin unless clinically contraindicated. Patient requires contact precautions if  hospitalized. CRITICAL RESULT CALLED TO, READ BACK BY AND  VERIFIED WITH: M LILLISTON,PHARMD@[id number]  11/03/21 [place]    Staphylococcus epidermidis NOT DETECTED NOT DETECTED Final   Staphylococcus lugdunensis NOT DETECTED NOT DETECTED Final   Streptococcus species NOT DETECTED NOT DETECTED Final   Streptococcus agalactiae NOT DETECTED NOT DETECTED Final   Streptococcus pneumoniae NOT DETECTED NOT DETECTED Final   Streptococcus pyogenes NOT DETECTED NOT DETECTED Final   A.calcoaceticus-baumannii NOT DETECTED NOT DETECTED Final   Bacteroides fragilis NOT DETECTED NOT DETECTED Final   Enterobacterales NOT DETECTED NOT DETECTED Final   Enterobacter cloacae complex NOT DETECTED NOT DETECTED Final   Escherichia coli NOT DETECTED NOT DETECTED Final   Klebsiella aerogenes NOT DETECTED NOT DETECTED Final   Klebsiella oxytoca NOT DETECTED NOT DETECTED Final   Klebsiella pneumoniae NOT DETECTED NOT DETECTED Final   Proteus species NOT DETECTED NOT DETECTED Final   Salmonella species NOT DETECTED NOT DETECTED  Final   Serratia marcescens NOT DETECTED NOT DETECTED Final   Haemophilus influenzae NOT DETECTED NOT DETECTED Final   Neisseria meningitidis NOT DETECTED NOT DETECTED Final   Pseudomonas aeruginosa NOT DETECTED NOT DETECTED Final   Stenotrophomonas maltophilia NOT DETECTED NOT DETECTED Final   Candida albicans NOT DETECTED NOT DETECTED Final   Candida auris NOT DETECTED NOT DETECTED Final   Candida glabrata NOT DETECTED NOT DETECTED Final   Candida krusei NOT DETECTED NOT DETECTED Final   Candida parapsilosis NOT DETECTED NOT DETECTED Final   Candida tropicalis NOT DETECTED NOT DETECTED Final   Cryptococcus neoformans/gattii NOT DETECTED NOT DETECTED Final   Meth resistant mecA/C and MREJ DETECTED (A) NOT DETECTED Final    Comment: CRITICAL RESULT CALLED TO, READ BACK BY AND VERIFIED WITH: M LILLISTON,PHARMD@0431  11/03/21 [place] Performed at [facility], [address] [place], [place] [address]   Blood culture (routine x 2)     Status: Abnormal  (Preliminary result)   Collection Time: 11/02/21 12:17 PM   Specimen: BLOOD  Result Value Ref Range Status   Specimen Description   Final    BLOOD LEFT ANTECUBITAL Performed at [facility] [address] [place], [place] [address]    Special Requests   Final    BOTTLES DRAWN AEROBIC AND ANAEROBIC Blood Culture adequate volume Performed at [facility] [address], [place], [place] [address]    Culture  Setup Time   Final    GRAM POSITIVE COCCI IN CLUSTERS IN BOTH AEROBIC AND ANAEROBIC BOTTLES CRITICAL VALUE NOTED.  VALUE IS CONSISTENT WITH PREVIOUSLY REPORTED AND CALLED VALUE.    Culture (A)  Final    STAPHYLOCOCCUS AUREUS CULTURE REINCUBATED FOR BETTER GROWTH Performed at [facility], [place] [address] [place], [place] [address]    Report Status PENDING  Incomplete  Resp Panel by RT-PCR (Flu A&B, Covid) Nasopharyngeal Swab     Status: None   Collection Time: 11/02/21  4:16 PM   Specimen: Nasopharyngeal Swab; Nasopharyngeal(NP) swabs in vial transport medium  Result Value Ref Range Status   SARS Coronavirus 2 by RT PCR NEGATIVE NEGATIVE Final    Comment: (NOTE) SARS-CoV-2 target nucleic acids are NOT DETECTED.  The SARS-CoV-2 RNA is generally detectable in upper respiratory specimens during the acute phase of infection. The lowest concentration of SARS-CoV-2 viral copies this assay can detect is 138 copies/mL. A negative result does not preclude SARS-Cov-2 infection and should not be used as the sole basis for treatment or other patient management decisions. A negative result may occur with  improper specimen collection/handling, submission of specimen other than nasopharyngeal swab, presence of viral mutation(s) within the areas targeted by this assay, and inadequate number of viral copies(<138 copies/mL). A negative result must be combined with clinical observations, patient history, and epidemiological information. The  expected result is Negative.  Fact Sheet for Patients:  EntrepreneurPulse.com.au  Fact Sheet for Healthcare Providers:  IncredibleEmployment.be  This test is no t yet approved or cleared by the [place] FDA and  has been authorized for detection and/or diagnosis of SARS-CoV-2 by FDA under an Emergency Use Authorization (EUA). This EUA will remain  in effect (meaning this test can be used) for the duration of the COVID-19 declaration under Section 564(b)(1) of the Act, 21 U.S.C.section 360bbb-3(b)(1), unless the authorization is terminated  or revoked sooner.       Influenza A by PCR NEGATIVE NEGATIVE Final   Influenza B by PCR NEGATIVE  NEGATIVE Final    Comment: (NOTE) The Xpert Xpress SARS-CoV-2/FLU/RSV plus assay is intended as an aid in the diagnosis of influenza from Nasopharyngeal swab specimens and should not be used as a sole basis for treatment. Nasal washings and aspirates are unacceptable for Xpert Xpress SARS-CoV-2/FLU/RSV testing.  Fact Sheet for Patients: EntrepreneurPulse.com.au  Fact Sheet for Healthcare Providers: IncredibleEmployment.be  This test is not yet approved or cleared by the [place] FDA and has been authorized for detection and/or diagnosis of SARS-CoV-2 by FDA under an Emergency Use Authorization (EUA). This EUA will remain in effect (meaning this test can be used) for the duration of the COVID-19 declaration under Section 564(b)(1) of the Act, 21 U.S.C. section 360bbb-3(b)(1), unless the authorization is terminated or revoked.  Performed at [facility], [place] [address], [place], [place] [address]   Urine Culture     Status: Abnormal (Preliminary result)   Collection Time: 11/02/21  4:33 PM   Specimen: In/Out Cath Urine  Result Value Ref Range Status   Specimen Description   Final    IN/OUT CATH URINE Performed at [facility] [address], [place], [place] [address]    Special Requests   Final    NONE Performed at [facility], [place] [address] [place], [place] [address]    Culture >=100,000 COLONIES/mL STAPHYLOCOCCUS AUREUS (A)  Final   Report Status PENDING  Incomplete  Culture, blood (routine x 2)     Status: None (Preliminary result)   Collection Time: 11/04/21  5:19 AM   Specimen: BLOOD RIGHT HAND  Result Value Ref Range Status   Specimen Description   Final    BLOOD RIGHT HAND Performed at [facility], [place] [address] [place], [place] [address]    Special Requests   Final    BOTTLES DRAWN AEROBIC ONLY Blood Culture adequate volume Performed at [facility] [address], [place], [place] [address]    Culture PENDING  Incomplete   Report Status PENDING  Incomplete  Culture, blood (routine x 2)     Status: None (Preliminary result)   Collection Time: 11/04/21  5:19 AM   Specimen: BLOOD LEFT HAND  Result Value Ref Range Status   Specimen Description   Final    BLOOD LEFT HAND Performed at [facility], [place] [address], [place], [place] [address]    Special Requests   Final    BOTTLES DRAWN AEROBIC ONLY Blood Culture adequate volume Performed at [facility] [address], [place], [place] [address]    Culture PENDING  Incomplete   Report Status PENDING  Incomplete     Serology:   Imaging: If present, new imagings (plain films, ct scans, and mri) have been personally visualized and interpreted; radiology reports have been reviewed. Decision making incorporated into the Impression / Recommendations.  11/13 cxr Bilateral patchy airspace disease is likely slightly decreased compared with prior exam, although evaluation is somewhat limited due to differences in technique. Recommend follow-up PA and lateral chest x-ray in 6-8 weeks to ensure complete resolution.   11/13 ct angio head/neck 1.  Retropharyngeal edema without visible pharyngeal or spinal source, suggest enhanced cervical MRI and inflammatory labs. 2. Mild for age atherosclerosis.   11/14 mri entire spine 1. Fluid and enhancement within the disc space at L4-5 with adjacent edema and enhancement of the endplates. 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 2. Extensive epidural enhancement in the lower lumbar spine  L2-3 through the sacrum compatible with infection. Recommend lumbar puncture further evaluation. 3. Prevertebral edema with peripheral postcontrast enhancement extending from the skull base through C5-6. Focal source for infection in the upper cervical spine not identified. 4. The study is moderately degraded by patient motion. 5. 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 6. Diffuse epidural enhancement at L2-3, L3-4, L4-5, and L5-S1 without discrete epidural abscess. 7. Moderate central canal and bilateral foraminal stenosis at L3-4. 8. Mild foraminal narrowing bilaterally at T7-8, T8-9 and T9-10 secondary to facet spurring.    11/14 mri brain 1. Severely motion degraded examination without evidence of acute intracranial abnormality. 2. Mild-to-moderate chronic small vessel ischemic disease.  11/15 tte  1. Left ventricular ejection fraction, by estimation, is 55 to 60%. The  left ventricle has normal function. The left ventricle has no regional  wall motion abnormalities. There is mild concentric left ventricular  hypertrophy. Left ventricular diastolic  parameters are consistent with Grade I diastolic dysfunction (impaired  relaxation).   2. Right ventricular systolic function is normal. The right ventricular  size is normal.   3. The mitral valve is normal in structure. No evidence of mitral valve  regurgitation. No evidence of mitral stenosis.   4. The aortic valve is normal in structure. Aortic valve regurgitation is  not visualized. No aortic stenosis is  present.   5. There is borderline dilatation of the ascending aorta, measuring 36  mm.   6. The inferior vena cava is normal in size with greater than 50%  respiratory variability, suggesting right atrial pressure of 3 mmHg.   Jabier Mutton, [place] [place] [phone] pager    11/04/2021, 3:26 PM

## 2021-11-04 NOTE — Progress Notes (Signed)
PROGRESS NOTE    Charles Weiss.  SWF:[id number] DOB: 22-Sep-1951 DOA: 11/02/2021 PCP: System, Provider Not In    Brief Narrative:  70 year old male with a history of asthma, hypertension, with recent admission for influenza pneumonia and respiratory failure, comes back to the emergency room with neck pain and headache.  He is noted to have a high fever.  Upon work-up, he was found to have evidence of discitis in the lumbar vertebra.  Blood cultures positive for MRSA.  He was started on vancomycin.   Assessment & Plan:   Principal Problem:   Sepsis ([place]) Active Problems:   Hypertension   Hyperlipidemia   Asthma   Acute respiratory failure with hypoxia (HCC)   Vertebral osteomyelitis (HCC)   MRSA bacteremia   Retropharyngeal abscess   Sepsis, secondary to MRSA bacteremia -Noted to be febrile with T-max of 103.4, tachycardic heart rate in the 110s, tachypneic source of infection is discitis.  Evidence of endorgan damage includes encephalopathy related to sepsis. -Infectious disease following -Currently on vancomycin -Repeat blood culture sent today -Will eventually need PICC line once cultures have cleared -2D echocardiogram performed that was unremarkable  Discitis/vertebral osteomyelitis -Noted to have significant discitis and lumbar vertebral/sacrum -Also noted to have significant epidural enhancement -Continue on IV antibiotics total of 8 weeks -Discussed with infectious disease/neurology was not felt that lumbar puncture would change management at this point  Abdominal pain -Noted to have diffuse abdominal tenderness -Unclear if this is contributing to underlying sepsis -Check CT abdomen  Acute encephalopathy -Related to sepsis -He is somnolent at this time -Continue to monitor  Hypertension -Currently on amlodipine and losartan -Holding hydrochlorothiazide since it is likely following completed -Blood pressure likely further elevated due to  pain  Hyperlipidemia -Continued on statin  Sore throat -Noted on CT scan to have retropharyngeal erythema.  No comment of obvious abscess no CT -EDP discussed with ENT who recommended conservative management with IV antibiotics -Was also recommended that he receive Decadron 8 mg every 8 hours x 3 doses -If symptoms worsen over the next 24 to 48 hours, recommendations are for reimaging  Asthma -Currently no wheezing -Continue on bronchodilators, Dulera   DVT prophylaxis: SCDs Start: 11/02/21 2028 Lovenox  Code Status: DNR, confirmed with sister Family Communication: Discussed with sister over the phone who is as per HCPOA Disposition Plan: Status is: Inpatient  Remains inpatient appropriate because: Continued IV antibiotics for MRSA bacteremia    Consultants:  Infectious disease Neurology  Procedures:  Echocardiogram  Antimicrobials:  Vancomycin 11/13 >   Subjective: Continues to have significant back pain.  Noted to have some agitation overnight.  Objective: Vitals:   11/04/21 0342 11/04/21 0953 11/04/21 0955 11/04/21 1203  BP: (!) 168/92 (!) 183/107 (!) 183/107 (!) 150/86  Pulse: 95  94 (!) 101  Resp: 16  20 18   Temp: 97.9 F (36.6 C)  99 F (37.2 C) 98.6 F (37 C)  TempSrc: Oral  Oral Oral  SpO2: 95%  97% 96%  Weight:      Height:        Intake/Output Summary (Last 24 hours) at 11/04/2021 2013 Last data filed at 11/04/2021 1501 Gross per 24 hour  Intake 1056.56 ml  Output 1775 ml  Net -718.44 ml   Filed Weights   11/02/21 0701  Weight: 113.4 kg    Examination:  General exam: Sleeping on my arrival, keeps eyes closed, but does answer some questions Respiratory system: Clear to auscultation. Respiratory effort normal. Cardiovascular system:RRR.  No murmurs, rubs, gallops. Gastrointestinal system: Abdomen is nondistended, soft and diffusely tender.  No organomegaly or masses felt. Normal bowel sounds heard. Central nervous system: No focal  neurological deficits. Extremities: No C/C/E, +pedal pulses Skin: No rashes, lesions or ulcers Psychiatry: Unable to assess     Data Reviewed: I have personally reviewed following labs and imaging studies  CBC: Recent Labs  Lab 11/02/21 0859 11/03/21 0440 11/04/21 0435  WBC 19.4* 30.0* 24.0*  NEUTROABS 17.4*  --   --   HGB 13.1 11.5* 11.2*  HCT 37.4* 33.0* 32.2*  MCV 89.3 88.7 89.4  PLT 292 252 144   Basic Metabolic Panel: Recent Labs  Lab 11/02/21 1028 11/03/21 0440 11/04/21 0435  NA 132* 133* 131*  K 3.7 4.3 3.9  CL 95* 97* 97*  CO2 27 26 25   GLUCOSE 153* 185* 175*  BUN 29* 19 26*  CREATININE 0.92 0.41* 0.76  CALCIUM 8.9 8.7* 8.7*   GFR: Estimated Creatinine Clearance: 111.7 mL/min (by C-G formula based on SCr of 0.76 mg/dL). Liver Function Tests: Recent Labs  Lab 11/02/21 1028 11/03/21 0440  AST 20 19  ALT 34 30  ALKPHOS 66 68  BILITOT 0.8 1.1  PROT 7.7 6.7  ALBUMIN 3.2* 2.5*   No results for input(s): LIPASE, AMYLASE in the last 168 hours. No results for input(s): AMMONIA in the last 168 hours. Coagulation Profile: Recent Labs  Lab 11/02/21 1642  INR 1.3*   Cardiac Enzymes: No results for input(s): CKTOTAL, CKMB, CKMBINDEX, TROPONINI in the last 168 hours. BNP (last 3 results) No results for input(s): PROBNP in the last 8760 hours. HbA1C: No results for input(s): HGBA1C in the last 72 hours. CBG: No results for input(s): GLUCAP in the last 168 hours. Lipid Profile: No results for input(s): CHOL, HDL, LDLCALC, TRIG, CHOLHDL, LDLDIRECT in the last 72 hours. Thyroid Function Tests: No results for input(s): TSH, T4TOTAL, FREET4, T3FREE, THYROIDAB in the last 72 hours. Anemia Panel: No results for input(s): VITAMINB12, FOLATE, FERRITIN, TIBC, IRON, RETICCTPCT in the last 72 hours. Sepsis Labs: Recent Labs  Lab 11/02/21 1632 11/03/21 0440  PROCALCITON  --  2.69  LATICACIDVEN 1.2  --     Recent Results (from the past 240 hour(s))  Blood  culture (routine x 2)     Status: Abnormal (Preliminary result)   Collection Time: 11/02/21 12:12 PM   Specimen: BLOOD  Result Value Ref Range Status   Specimen Description   Final    BLOOD RIGHT ANTECUBITAL Performed at [facility], [place] [address], [place], [place] [address]    Special Requests   Final    BOTTLES DRAWN AEROBIC AND ANAEROBIC Blood Culture results may not be optimal due to an excessive volume of blood received in culture bottles Performed at [facility] [address] [place], [place] [address]    Culture  Setup Time   Final    GRAM POSITIVE COCCI IN CLUSTERS IN BOTH AEROBIC AND ANAEROBIC BOTTLES CRITICAL RESULT CALLED TO, READ BACK BY AND VERIFIED WITH: M LILLISTON,PHARMD@0432  11/03/21 [place]    Culture (A)  Final    STAPHYLOCOCCUS AUREUS SUSCEPTIBILITIES TO FOLLOW Performed at [facility], [address], [place], [place] [address]    Report Status PENDING  Incomplete  Blood Culture ID Panel (Reflexed)     Status: Abnormal   Collection Time: 11/02/21 12:12 PM  Result Value Ref Range Status   Enterococcus faecalis NOT DETECTED NOT DETECTED Final   Enterococcus  Faecium NOT DETECTED NOT DETECTED Final   Listeria monocytogenes NOT DETECTED NOT DETECTED Final   Staphylococcus species DETECTED (A) NOT DETECTED Final    Comment: CRITICAL RESULT CALLED TO, READ BACK BY AND VERIFIED WITH: M LILLISTON,PHARMD@0431  11/03/21 [place]    Staphylococcus aureus (BCID) DETECTED (A) NOT DETECTED Final    Comment: Methicillin (oxacillin)-resistant Staphylococcus aureus (MRSA). MRSA is predictably resistant to beta-lactam antibiotics (except ceftaroline). Preferred therapy is vancomycin unless clinically contraindicated. Patient requires contact precautions if  hospitalized. CRITICAL RESULT CALLED TO, READ BACK BY AND VERIFIED WITH: M LILLISTON,PHARMD@0431  11/03/21 [place]    Staphylococcus epidermidis NOT DETECTED NOT DETECTED Final    Staphylococcus lugdunensis NOT DETECTED NOT DETECTED Final   Streptococcus species NOT DETECTED NOT DETECTED Final   Streptococcus agalactiae NOT DETECTED NOT DETECTED Final   Streptococcus pneumoniae NOT DETECTED NOT DETECTED Final   Streptococcus pyogenes NOT DETECTED NOT DETECTED Final   A.calcoaceticus-baumannii NOT DETECTED NOT DETECTED Final   Bacteroides fragilis NOT DETECTED NOT DETECTED Final   Enterobacterales NOT DETECTED NOT DETECTED Final   Enterobacter cloacae complex NOT DETECTED NOT DETECTED Final   Escherichia coli NOT DETECTED NOT DETECTED Final   Klebsiella aerogenes NOT DETECTED NOT DETECTED Final   Klebsiella oxytoca NOT DETECTED NOT DETECTED Final   Klebsiella pneumoniae NOT DETECTED NOT DETECTED Final   Proteus species NOT DETECTED NOT DETECTED Final   Salmonella species NOT DETECTED NOT DETECTED Final   Serratia marcescens NOT DETECTED NOT DETECTED Final   Haemophilus influenzae NOT DETECTED NOT DETECTED Final   Neisseria meningitidis NOT DETECTED NOT DETECTED Final   Pseudomonas aeruginosa NOT DETECTED NOT DETECTED Final   Stenotrophomonas maltophilia NOT DETECTED NOT DETECTED Final   Candida albicans NOT DETECTED NOT DETECTED Final   Candida auris NOT DETECTED NOT DETECTED Final   Candida glabrata NOT DETECTED NOT DETECTED Final   Candida krusei NOT DETECTED NOT DETECTED Final   Candida parapsilosis NOT DETECTED NOT DETECTED Final   Candida tropicalis NOT DETECTED NOT DETECTED Final   Cryptococcus neoformans/gattii NOT DETECTED NOT DETECTED Final   Meth resistant mecA/C and MREJ DETECTED (A) NOT DETECTED Final    Comment: CRITICAL RESULT CALLED TO, READ BACK BY AND VERIFIED WITH: M LILLISTON,PHARMD@0431  11/03/21 [place] Performed at [facility], [address], [place], [place] [address]   Blood culture (routine x 2)     Status: Abnormal (Preliminary result)   Collection Time: 11/02/21 12:17 PM   Specimen: BLOOD  Result Value Ref Range Status   Specimen  Description   Final    BLOOD LEFT ANTECUBITAL Performed at [facility] [address], [place], [place] [address]    Special Requests   Final    BOTTLES DRAWN AEROBIC AND ANAEROBIC Blood Culture adequate volume Performed at [facility] [address], [place], [place] [address]    Culture  Setup Time   Final    GRAM POSITIVE COCCI IN CLUSTERS IN BOTH AEROBIC AND ANAEROBIC BOTTLES CRITICAL VALUE NOTED.  VALUE IS CONSISTENT WITH PREVIOUSLY REPORTED AND CALLED VALUE.    Culture (A)  Final    STAPHYLOCOCCUS AUREUS CULTURE REINCUBATED FOR BETTER GROWTH Performed at [facility], [place] [address], [place], [place] [address]    Report Status PENDING  Incomplete  Resp Panel by RT-PCR (Flu A&B, Covid) Nasopharyngeal Swab     Status: None   Collection Time: 11/02/21  4:16 PM   Specimen: Nasopharyngeal Swab; Nasopharyngeal(NP) swabs in vial transport medium  Result Value  Ref Range Status   SARS Coronavirus 2 by RT PCR NEGATIVE NEGATIVE Final    Comment: (NOTE) SARS-CoV-2 target nucleic acids are NOT DETECTED.  The SARS-CoV-2 RNA is generally detectable in upper respiratory specimens during the acute phase of infection. The lowest concentration of SARS-CoV-2 viral copies this assay can detect is 138 copies/mL. A negative result does not preclude SARS-Cov-2 infection and should not be used as the sole basis for treatment or other patient management decisions. A negative result may occur with  improper specimen collection/handling, submission of specimen other than nasopharyngeal swab, presence of viral mutation(s) within the areas targeted by this assay, and inadequate number of viral copies(<138 copies/mL). A negative result must be combined with clinical observations, patient history, and epidemiological information. The expected result is Negative.  Fact Sheet for Patients:  EntrepreneurPulse.com.au  Fact Sheet for  Healthcare Providers:  IncredibleEmployment.be  This test is no t yet approved or cleared by the [place] FDA and  has been authorized for detection and/or diagnosis of SARS-CoV-2 by FDA under an Emergency Use Authorization (EUA). This EUA will remain  in effect (meaning this test can be used) for the duration of the COVID-19 declaration under Section 564(b)(1) of the Act, 21 U.S.C.section 360bbb-3(b)(1), unless the authorization is terminated  or revoked sooner.       Influenza A by PCR NEGATIVE NEGATIVE Final   Influenza B by PCR NEGATIVE NEGATIVE Final    Comment: (NOTE) The Xpert Xpress SARS-CoV-2/FLU/RSV plus assay is intended as an aid in the diagnosis of influenza from Nasopharyngeal swab specimens and should not be used as a sole basis for treatment. Nasal washings and aspirates are unacceptable for Xpert Xpress SARS-CoV-2/FLU/RSV testing.  Fact Sheet for Patients: EntrepreneurPulse.com.au  Fact Sheet for Healthcare Providers: IncredibleEmployment.be  This test is not yet approved or cleared by the [place] FDA and has been authorized for detection and/or diagnosis of SARS-CoV-2 by FDA under an Emergency Use Authorization (EUA). This EUA will remain in effect (meaning this test can be used) for the duration of the COVID-19 declaration under Section 564(b)(1) of the Act, 21 U.S.C. section 360bbb-3(b)(1), unless the authorization is terminated or revoked.  Performed at [facility], [place] [address] [place], [place] [address]   Urine Culture     Status: Abnormal (Preliminary result)   Collection Time: 11/02/21  4:33 PM   Specimen: In/Out Cath Urine  Result Value Ref Range Status   Specimen Description   Final    IN/OUT CATH URINE Performed at [facility] [address], [place], [place] [address]    Special Requests   Final    NONE Performed at [facility], [place] [address] [place], [place] [address]    Culture >=100,000 COLONIES/mL STAPHYLOCOCCUS AUREUS (A)  Final   Report Status PENDING  Incomplete  Culture, blood (routine x 2)     Status: None (Preliminary result)   Collection Time: 11/04/21  5:19 AM   Specimen: BLOOD RIGHT HAND  Result Value Ref Range Status   Specimen Description   Final    BLOOD RIGHT HAND Performed at [facility], [place] [address] [place], [place] [address]    Special Requests   Final    BOTTLES DRAWN AEROBIC ONLY Blood Culture adequate volume Performed at [facility] [address], [place], [place] [address]    Culture PENDING  Incomplete   Report Status PENDING  Incomplete  Culture, blood (routine x  2)     Status: None (Preliminary result)   Collection Time: 11/04/21  5:19 AM   Specimen: BLOOD LEFT HAND  Result Value Ref Range Status   Specimen Description   Final    BLOOD LEFT HAND Performed at [facility], [place] [address] [place], [place] [address]    Special Requests   Final    BOTTLES DRAWN AEROBIC ONLY Blood Culture adequate volume Performed at [place] [address], [place],  [address]    Culture PENDING  Incomplete   Report Status PENDING  Incomplete         Radiology Studies: MR BRAIN W WO CONTRAST  Result Date: 11/03/2021 CLINICAL DATA:  Meningitis/CNS infection suspected. EXAM: MRI HEAD WITHOUT AND WITH CONTRAST TECHNIQUE: Multiplanar, multiecho pulse sequences of the brain and surrounding structures were obtained without and with intravenous contrast. CONTRAST:  108mL GADAVIST GADOBUTROL 1 MMOL/ML IV SOLN COMPARISON:  Head and neck CTA 11/02/2021 FINDINGS: The study is motion degraded including severe motion artifact on postcontrast and susceptibility weighted imaging. Brain: There is no evidence of an acute infarct, intracranial hemorrhage, mass, midline shift, or extra-axial fluid collection. T2  hyperintensities in the cerebral white matter and pons are nonspecific but compatible with mildly to moderately age advanced chronic small vessel ischemic disease. The ventricles and sulci are within normal limits for age. No gross abnormal intracranial enhancement is identified although assessment is severely limited by motion. No subarachnoid space debris is evident on FLAIR or diffusion weighted imaging. Vascular: Major intracranial vascular flow voids are preserved. Skull and upper cervical spine: Unremarkable bone marrow signal. Sinuses/Orbits: Unremarkable orbits. Paranasal sinuses and mastoid air cells are clear. Other: None. IMPRESSION: 1. Severely motion degraded examination without evidence of acute intracranial abnormality. 2. Mild-to-moderate chronic small vessel ischemic disease. Electronically Signed   By: Logan Bores M.D.   On: 11/03/2021 10:43   MR Cervical Spine W or Wo Contrast  Result Date: 11/03/2021 CLINICAL DATA:  Retropharyngeal edema. Meningitis/CNS infection suspected. EXAM: MRI CERVICAL, THORACIC AND LUMBAR SPINE WITHOUT AND WITH CONTRAST TECHNIQUE: Multiplanar and multiecho pulse sequences of the cervical spine, to include the craniocervical junction and cervicothoracic junction, and thoracic and lumbar spine, were obtained without and with intravenous contrast. CONTRAST:  53mL GADAVIST GADOBUTROL 1 MMOL/ML IV SOLN COMPARISON:  Retropharyngeal edema. FINDINGS: MRI CERVICAL SPINE FINDINGS Alignment: No significant listhesis is present. Mild straightening of the normal cervical lordosis is noted. Vertebrae: Chronic fatty endplate marrow changes are noted from C3-4 through C7-T1. Vertebral body heights are maintained. Cord: Normal signal and morphology. Posterior Fossa, vertebral arteries, paraspinal tissues: Prevertebral edema extends from the skull base through C5-6. Peripheral postcontrast enhancement of the collection is noted. Postcontrast images are moderately degraded by patient  motion. No definite intracanalicular enhancement is present in the cervical spine. Disc levels: Axial images were not obtained in the cervical spine. Multilevel disc disease is present in the cervical spine with effacement of the ventral CSF at C3-4, C4-5, C5-6, and C6-7. Foraminal disease is greatest at C5-6 on the left and C4-5 on the right. MRI THORACIC SPINE FINDINGS Alignment: No significant listhesis is present in the thoracic spine. Straightening of the normal kyphosis is noted. Vertebrae: Fatty endplate marrow changes present at T6-7. Marrow signal and vertebral body heights normal. Cord: Normal signal and morphology. No significant intracanalicular enhancement is present in the thoracic spine. Paraspinal and other soft tissues: Limited imaging the abdomen is unremarkable. There is no significant adenopathy. No solid organ  lesions are present. Disc levels: Mild disc bulging is present at T5-6 and T6-7 without significant stenosis. Mild foraminal narrowing is present at T7-8, T8-9 and T9-10, right greater than left, secondary to facet spurring. MRI LUMBAR SPINE FINDINGS Segmentation: 5 non rib-bearing lumbar type vertebral bodies are present. The lowest fully formed vertebral body is L5. Alignment: No significant listhesis is present. Straightening of the normal lumbar lordosis is noted. Vertebrae: Study is moderately degraded by patient motion. Chronic fatty endplate marrow changes are noted anteriorly at L3-4. Edematous changes are present anteriorly at L4-5 with some enhancement of the endplates and fluid in the disc space at L4-5. Conus medullaris and cauda equina: Conus extends to the L1 level. Conus and cauda equina appear normal. Paraspinal and other soft tissues: Limited imaging the abdomen is unremarkable. There is no significant adenopathy. No solid organ lesions are present. Disc levels: T12-L1: Negative. L1-2: Negative. L2-3: Mild epidural enhancement is noted, anteriorly more posteriorly.  Broad-based disc protrusion extends into the foramina bilaterally with moderate foraminal narrowing bilaterally. L3-4: Broad-based disc protrusion is present. Moderate facet hypertrophy is noted bilaterally. Moderate central bilateral foraminal stenosis is present. Diffuse epidural enhancement is present. No discrete epidural abscess is present. L4-5: Fluid is present in the disc space. Enhancement is present disc space and endplates. Extensive epidural enhancement is present. Central and foraminal stenosis present. L5-S1: Loss of disc height is present. Diffuse epidural enhancement present. Central disc protrusion contributes to mild central canal stenosis. Facet spurring contributes to mild foraminal narrowing bilaterally. IMPRESSION: 1. Fluid and enhancement within the disc space at L4-5 with adjacent edema and enhancement of the endplates. 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 2. Extensive epidural enhancement in the lower lumbar spine L2-3 through the sacrum compatible with infection. Recommend lumbar puncture further evaluation. 3. Prevertebral edema with peripheral postcontrast enhancement extending from the skull base through C5-6. Focal source for infection in the upper cervical spine not identified. 4. The study is moderately degraded by patient motion. 5. 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 6. Diffuse epidural enhancement at L2-3, L3-4, L4-5, and L5-S1 without discrete epidural abscess. 7. Moderate central canal and bilateral foraminal stenosis at L3-4. 8. Mild foraminal narrowing bilaterally at T7-8, T8-9 and T9-10 secondary to facet spurring. These results will be called to the ordering clinician or representative by the Radiologist Assistant, and communication documented in the PACS or [employer]. Electronically Signed   By: San Morelle M.D.   On: 11/03/2021 10:55   MR THORACIC SPINE W WO CONTRAST  Result Date: 11/03/2021 CLINICAL DATA:   Retropharyngeal edema. Meningitis/CNS infection suspected. EXAM: MRI CERVICAL, THORACIC AND LUMBAR SPINE WITHOUT AND WITH CONTRAST TECHNIQUE: Multiplanar and multiecho pulse sequences of the cervical spine, to include the craniocervical junction and cervicothoracic junction, and thoracic and lumbar spine, were obtained without and with intravenous contrast. CONTRAST:  80mL GADAVIST GADOBUTROL 1 MMOL/ML IV SOLN COMPARISON:  Retropharyngeal edema. FINDINGS: MRI CERVICAL SPINE FINDINGS Alignment: No significant listhesis is present. Mild straightening of the normal cervical lordosis is noted. Vertebrae: Chronic fatty endplate marrow changes are noted from C3-4 through C7-T1. Vertebral body heights are maintained. Cord: Normal signal and morphology. Posterior Fossa, vertebral arteries, paraspinal tissues: Prevertebral edema extends from the skull base through C5-6. Peripheral postcontrast enhancement of the collection is noted. Postcontrast images are moderately degraded by patient motion. No definite intracanalicular enhancement is present in the cervical spine. Disc levels: Axial images were not obtained in the cervical spine. Multilevel disc disease is present in  the cervical spine with effacement of the ventral CSF at C3-4, C4-5, C5-6, and C6-7. Foraminal disease is greatest at C5-6 on the left and C4-5 on the right. MRI THORACIC SPINE FINDINGS Alignment: No significant listhesis is present in the thoracic spine. Straightening of the normal kyphosis is noted. Vertebrae: Fatty endplate marrow changes present at T6-7. Marrow signal and vertebral body heights normal. Cord: Normal signal and morphology. No significant intracanalicular enhancement is present in the thoracic spine. Paraspinal and other soft tissues: Limited imaging the abdomen is unremarkable. There is no significant adenopathy. No solid organ lesions are present. Disc levels: Mild disc bulging is present at T5-6 and T6-7 without significant stenosis.  Mild foraminal narrowing is present at T7-8, T8-9 and T9-10, right greater than left, secondary to facet spurring. MRI LUMBAR SPINE FINDINGS Segmentation: 5 non rib-bearing lumbar type vertebral bodies are present. The lowest fully formed vertebral body is L5. Alignment: No significant listhesis is present. Straightening of the normal lumbar lordosis is noted. Vertebrae: Study is moderately degraded by patient motion. Chronic fatty endplate marrow changes are noted anteriorly at L3-4. Edematous changes are present anteriorly at L4-5 with some enhancement of the endplates and fluid in the disc space at L4-5. Conus medullaris and cauda equina: Conus extends to the L1 level. Conus and cauda equina appear normal. Paraspinal and other soft tissues: Limited imaging the abdomen is unremarkable. There is no significant adenopathy. No solid organ lesions are present. Disc levels: T12-L1: Negative. L1-2: Negative. L2-3: Mild epidural enhancement is noted, anteriorly more posteriorly. Broad-based disc protrusion extends into the foramina bilaterally with moderate foraminal narrowing bilaterally. L3-4: Broad-based disc protrusion is present. Moderate facet hypertrophy is noted bilaterally. Moderate central bilateral foraminal stenosis is present. Diffuse epidural enhancement is present. No discrete epidural abscess is present. L4-5: Fluid is present in the disc space. Enhancement is present disc space and endplates. Extensive epidural enhancement is present. Central and foraminal stenosis present. L5-S1: Loss of disc height is present. Diffuse epidural enhancement present. Central disc protrusion contributes to mild central canal stenosis. Facet spurring contributes to mild foraminal narrowing bilaterally. IMPRESSION: 1. Fluid and enhancement within the disc space at L4-5 with adjacent edema and enhancement of the endplates. 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 2. Extensive epidural enhancement in the lower  lumbar spine L2-3 through the sacrum compatible with infection. Recommend lumbar puncture further evaluation. 3. Prevertebral edema with peripheral postcontrast enhancement extending from the skull base through C5-6. Focal source for infection in the upper cervical spine not identified. 4. The study is moderately degraded by patient motion. 5. 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 6. Diffuse epidural enhancement at L2-3, L3-4, L4-5, and L5-S1 without discrete epidural abscess. 7. Moderate central canal and bilateral foraminal stenosis at L3-4. 8. Mild foraminal narrowing bilaterally at T7-8, T8-9 and T9-10 secondary to facet spurring. These results will be called to the ordering clinician or representative by the Radiologist Assistant, and communication documented in the PACS or [employer]. Electronically Signed   By: San Morelle M.D.   On: 11/03/2021 10:55   MR Lumbar Spine W Wo Contrast  Result Date: 11/03/2021 CLINICAL DATA:  Retropharyngeal edema. Meningitis/CNS infection suspected. EXAM: MRI CERVICAL, THORACIC AND LUMBAR SPINE WITHOUT AND WITH CONTRAST TECHNIQUE: Multiplanar and multiecho pulse sequences of the cervical spine, to include the craniocervical junction and cervicothoracic junction, and thoracic and lumbar spine, were obtained without and with intravenous contrast. CONTRAST:  62mL GADAVIST GADOBUTROL 1 MMOL/ML IV SOLN COMPARISON:  Retropharyngeal edema. FINDINGS:  MRI CERVICAL SPINE FINDINGS Alignment: No significant listhesis is present. Mild straightening of the normal cervical lordosis is noted. Vertebrae: Chronic fatty endplate marrow changes are noted from C3-4 through C7-T1. Vertebral body heights are maintained. Cord: Normal signal and morphology. Posterior Fossa, vertebral arteries, paraspinal tissues: Prevertebral edema extends from the skull base through C5-6. Peripheral postcontrast enhancement of the collection is noted. Postcontrast images are  moderately degraded by patient motion. No definite intracanalicular enhancement is present in the cervical spine. Disc levels: Axial images were not obtained in the cervical spine. Multilevel disc disease is present in the cervical spine with effacement of the ventral CSF at C3-4, C4-5, C5-6, and C6-7. Foraminal disease is greatest at C5-6 on the left and C4-5 on the right. MRI THORACIC SPINE FINDINGS Alignment: No significant listhesis is present in the thoracic spine. Straightening of the normal kyphosis is noted. Vertebrae: Fatty endplate marrow changes present at T6-7. Marrow signal and vertebral body heights normal. Cord: Normal signal and morphology. No significant intracanalicular enhancement is present in the thoracic spine. Paraspinal and other soft tissues: Limited imaging the abdomen is unremarkable. There is no significant adenopathy. No solid organ lesions are present. Disc levels: Mild disc bulging is present at T5-6 and T6-7 without significant stenosis. Mild foraminal narrowing is present at T7-8, T8-9 and T9-10, right greater than left, secondary to facet spurring. MRI LUMBAR SPINE FINDINGS Segmentation: 5 non rib-bearing lumbar type vertebral bodies are present. The lowest fully formed vertebral body is L5. Alignment: No significant listhesis is present. Straightening of the normal lumbar lordosis is noted. Vertebrae: Study is moderately degraded by patient motion. Chronic fatty endplate marrow changes are noted anteriorly at L3-4. Edematous changes are present anteriorly at L4-5 with some enhancement of the endplates and fluid in the disc space at L4-5. Conus medullaris and cauda equina: Conus extends to the L1 level. Conus and cauda equina appear normal. Paraspinal and other soft tissues: Limited imaging the abdomen is unremarkable. There is no significant adenopathy. No solid organ lesions are present. Disc levels: T12-L1: Negative. L1-2: Negative. L2-3: Mild epidural enhancement is noted,  anteriorly more posteriorly. Broad-based disc protrusion extends into the foramina bilaterally with moderate foraminal narrowing bilaterally. L3-4: Broad-based disc protrusion is present. Moderate facet hypertrophy is noted bilaterally. Moderate central bilateral foraminal stenosis is present. Diffuse epidural enhancement is present. No discrete epidural abscess is present. L4-5: Fluid is present in the disc space. Enhancement is present disc space and endplates. Extensive epidural enhancement is present. Central and foraminal stenosis present. L5-S1: Loss of disc height is present. Diffuse epidural enhancement present. Central disc protrusion contributes to mild central canal stenosis. Facet spurring contributes to mild foraminal narrowing bilaterally. IMPRESSION: 1. Fluid and enhancement within the disc space at L4-5 with adjacent edema and enhancement of the endplates. 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 2. Extensive epidural enhancement in the lower lumbar spine L2-3 through the sacrum compatible with infection. Recommend lumbar puncture further evaluation. 3. Prevertebral edema with peripheral postcontrast enhancement extending from the skull base through C5-6. Focal source for infection in the upper cervical spine not identified. 4. The study is moderately degraded by patient motion. 5. 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 6. Diffuse epidural enhancement at L2-3, L3-4, L4-5, and L5-S1 without discrete epidural abscess. 7. Moderate central canal and bilateral foraminal stenosis at L3-4. 8. Mild foraminal narrowing bilaterally at T7-8, T8-9 and T9-10 secondary to facet spurring. These results will be called to the ordering clinician or representative by the Radiologist  Environmental consultant, and communication documented in the PACS or [employer]. Electronically Signed   By: San Morelle M.D.   On: 11/03/2021 10:55   ECHOCARDIOGRAM COMPLETE  Result Date: 11/03/2021     ECHOCARDIOGRAM REPORT   Patient Name:   [facility]. Date of Exam: 11/03/2021 Medical Rec #:  [id number]                  Height:       72.0 in Accession #:    [id number]                 Weight:       250.0 lb Date of Birth:  [birth year]                   BSA:          2.343 m Patient Age:    89 years                   BP:           165/107 mmHg Patient Gender: M                          HR:           108 bpm. Exam Location:  Inpatient Procedure: 2D Echo, Cardiac Doppler, Color Doppler and Intracardiac            Opacification Agent Indications:    Bacteremia  History:        Patient has no prior history of Echocardiogram examinations.                 Signs/Symptoms:Bacteremia, Shortness of Breath and Dyspnea.                 Hypoxia. FLU.  Sonographer:    Roseanna Rainbow RDCS Referring Phys: [id number] [facility] T VU  Sonographer Comments: Technically difficult study due to poor echo windows and patient is morbidly obese. Image acquisition challenging due to patient body habitus. IMPRESSIONS  1. Left ventricular ejection fraction, by estimation, is 55 to 60%. The left ventricle has normal function. The left ventricle has no regional wall motion abnormalities. There is mild concentric left ventricular hypertrophy. Left ventricular diastolic parameters are consistent with Grade I diastolic dysfunction (impaired relaxation).  2. Right ventricular systolic function is normal. The right ventricular size is normal.  3. The mitral valve is normal in structure. No evidence of mitral valve regurgitation. No evidence of mitral stenosis.  4. The aortic valve is normal in structure. Aortic valve regurgitation is not visualized. No aortic stenosis is present.  5. There is borderline dilatation of the ascending aorta, measuring 36 mm.  6. The inferior vena cava is normal in size with greater than 50% respiratory variability, suggesting right atrial pressure of 3 mmHg. Comparison(s): No prior Echocardiogram. FINDINGS  Left  Ventricle: Left ventricular ejection fraction, by estimation, is 55 to 60%. The left ventricle has normal function. The left ventricle has no regional wall motion abnormalities. Definity contrast agent was given IV to delineate the left ventricular  endocardial borders. The left ventricular internal cavity size was normal in size. There is mild concentric left ventricular hypertrophy. Left ventricular diastolic parameters are consistent with Grade I diastolic dysfunction (impaired relaxation). Right Ventricle: The right ventricular size is normal. No increase in right ventricular wall thickness. Right ventricular systolic function is normal. Left Atrium: Left atrial size was normal in size.  Right Atrium: Right atrial size was normal in size. Pericardium: There is no evidence of pericardial effusion. Presence of epicardial fat layer. Mitral Valve: The mitral valve is normal in structure. No evidence of mitral valve regurgitation. No evidence of mitral valve stenosis. MV peak gradient, 11.7 mmHg. The mean mitral valve gradient is 5.0 mmHg. Tricuspid Valve: The tricuspid valve is normal in structure. Tricuspid valve regurgitation is not demonstrated. No evidence of tricuspid stenosis. Aortic Valve: The aortic valve is normal in structure. Aortic valve regurgitation is not visualized. No aortic stenosis is present. Pulmonic Valve: The pulmonic valve was normal in structure. Pulmonic valve regurgitation is not visualized. No evidence of pulmonic stenosis. Aorta: There is borderline dilatation of the ascending aorta, measuring 36 mm. Venous: The inferior vena cava is normal in size with greater than 50% respiratory variability, suggesting right atrial pressure of 3 mmHg. IAS/Shunts: No atrial level shunt detected by color flow Doppler.  LEFT VENTRICLE PLAX 2D LVIDd:         4.50 cm      Diastology LVIDs:         3.60 cm      LV e' medial:    19.00 cm/s LV PW:         1.07 cm      LV E/e' medial:  8.2 LV IVS:        1.04  cm      LV e' lateral:   17.90 cm/s LVOT diam:     2.10 cm      LV E/e' lateral: 8.7 LV SV:         95 LV SV Index:   40 LVOT Area:     3.46 cm  LV Volumes (MOD) LV vol d, MOD A2C: 104.2 ml LV vol d, MOD A4C: 112.0 ml LV vol s, MOD A2C: 45.8 ml LV vol s, MOD A4C: 54.9 ml LV SV MOD A2C:     58.3 ml LV SV MOD A4C:     112.0 ml LV SV MOD BP:      57.0 ml RIGHT VENTRICLE            IVC RV S prime:     9.36 cm/s  IVC diam: 2.60 cm TAPSE (M-mode): 1.4 cm LEFT ATRIUM             Index        RIGHT ATRIUM           Index LA diam:        3.20 cm 1.37 cm/m   RA Area:     11.50 cm LA Vol (A2C):   73.4 ml 31.33 ml/m  RA Volume:   24.70 ml  10.54 ml/m LA Vol (A4C):   26.5 ml 11.31 ml/m LA Biplane Vol: 45.0 ml 19.21 ml/m  AORTIC VALVE LVOT Vmax:   141.00 cm/s LVOT Vmean:  94.600 cm/s LVOT VTI:    0.273 m  AORTA Ao Root diam: 3.80 cm Ao Asc diam:  3.60 cm MITRAL VALVE MV Area (PHT): 3.60 cm     SHUNTS MV Area VTI:   3.25 cm     Systemic VTI:  0.27 m MV Peak grad:  11.7 mmHg    Systemic Diam: 2.10 cm MV Mean grad:  5.0 mmHg MV Vmax:       1.71 m/s MV Vmean:      101.0 cm/s MV Decel Time: 211 msec MV E velocity: 155.00 cm/s Godfrey Pick Tobb DO Electronically signed by Berniece Salines  DO Signature Date/Time: 11/03/2021/12:08:04 PM    Final         Scheduled Meds:  amLODipine  5 mg Oral Daily   atorvastatin  20 mg Oral Daily   enoxaparin (LOVENOX) injection  60 mg Subcutaneous Q24H   losartan  50 mg Oral Daily   mometasone-formoterol  2 puff Inhalation BID   sodium chloride flush  3 mL Intravenous Q12H   Continuous Infusions:  sodium chloride 10 mL/hr at 11/03/21 1032   lactated ringers 100 mL/hr at 11/04/21 1412   vancomycin 1,000 mg (11/04/21 1006)     LOS: 2 days    Time spent: 58mins    Kathie Dike, MD [facility]   If 7PM-7AM, please contact night-coverage [email or web address]  11/04/2021, 8:13 PM

## 2021-11-05 ENCOUNTER — Inpatient Hospital Stay (HOSPITAL_COMMUNITY): Payer: Medicare Other

## 2021-11-05 DIAGNOSIS — R1031 Right lower quadrant pain: Secondary | ICD-10-CM | POA: Diagnosis not present

## 2021-11-05 DIAGNOSIS — J390 Retropharyngeal and parapharyngeal abscess: Secondary | ICD-10-CM | POA: Diagnosis not present

## 2021-11-05 DIAGNOSIS — M4620 Osteomyelitis of vertebra, site unspecified: Secondary | ICD-10-CM | POA: Diagnosis not present

## 2021-11-05 DIAGNOSIS — I10 Essential (primary) hypertension: Secondary | ICD-10-CM

## 2021-11-05 DIAGNOSIS — R7881 Bacteremia: Secondary | ICD-10-CM | POA: Diagnosis not present

## 2021-11-05 DIAGNOSIS — A4102 Sepsis due to Methicillin resistant Staphylococcus aureus: Principal | ICD-10-CM

## 2021-11-05 DIAGNOSIS — A419 Sepsis, unspecified organism: Secondary | ICD-10-CM | POA: Diagnosis not present

## 2021-11-05 LAB — CBC
HCT: 34 % — ABNORMAL LOW (ref 39.0–52.0)
Hemoglobin: 11.9 g/dL — ABNORMAL LOW (ref 13.0–17.0)
MCH: 30.6 pg (ref 26.0–34.0)
MCHC: 35 g/dL (ref 30.0–36.0)
MCV: 87.4 fL (ref 80.0–100.0)
Platelets: 243 10*3/uL (ref 150–400)
RBC: 3.89 MIL/uL — ABNORMAL LOW (ref 4.22–5.81)
RDW: 14.6 % (ref 11.5–15.5)
WBC: 21.4 10*3/uL — ABNORMAL HIGH (ref 4.0–10.5)
nRBC: 0 % (ref 0.0–0.2)

## 2021-11-05 LAB — BASIC METABOLIC PANEL
Anion gap: 9 (ref 5–15)
BUN: 22 mg/dL (ref 8–23)
CO2: 26 mmol/L (ref 22–32)
Calcium: 8.7 mg/dL — ABNORMAL LOW (ref 8.9–10.3)
Chloride: 95 mmol/L — ABNORMAL LOW (ref 98–111)
Creatinine, Ser: 0.72 mg/dL (ref 0.61–1.24)
GFR, Estimated: >60 mL/min (ref >60)
Glucose, Bld: 137 mg/dL — ABNORMAL HIGH (ref 70–99)
Potassium: 4.5 mmol/L (ref 3.5–5.1)
Sodium: 130 mmol/L — ABNORMAL LOW (ref 135–145)

## 2021-11-05 LAB — URINE CULTURE: Culture: >=100000 — AB

## 2021-11-05 LAB — GLUCOSE, CAPILLARY: Glucose-Capillary: 133 mg/dL — ABNORMAL HIGH (ref 70–99)

## 2021-11-05 LAB — CULTURE, BLOOD (ROUTINE X 2): Special Requests: ADEQUATE

## 2021-11-05 IMAGING — CT CT ABD-PELV W/ CM
2 of 5 series · 15 of 46 positions shown, 17 images · IV contrast (OMNIPAQUE)
Comparison: None.

CLINICAL DATA: Acute abdominal pain.

EXAM:
CT ABDOMEN AND PELVIS WITH CONTRAST
TECHNIQUE: Multidetector CT imaging of the abdomen and pelvis was performed
using the standard protocol following bolus administration of
intravenous contrast.
CONTRAST:  80mL OMNIPAQUE IOHEXOL 350 MG/ML SOLN

[Series 2: axial st · axial · 0.91mm/px · z∈[-438,+2]mm · 12 of 104 slices shown, 14 images]
[im 8/104  soft-tissue]
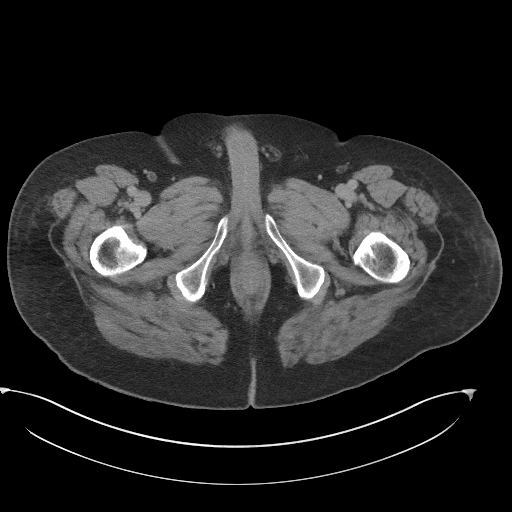
[im 8/104  bone]
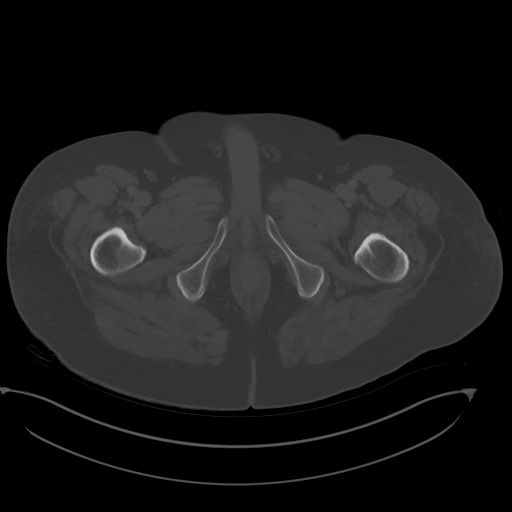
[im 16/104  soft-tissue]
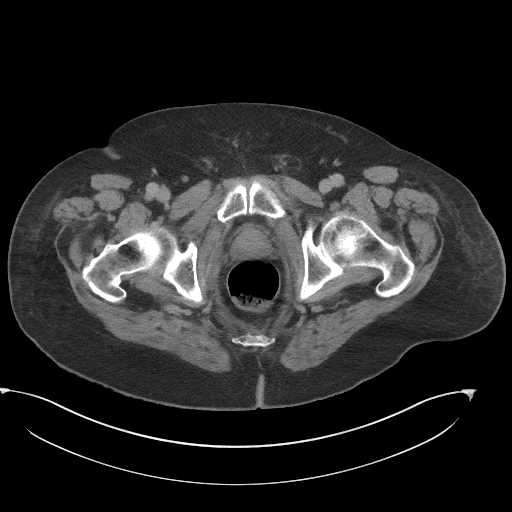
[im 24/104  soft-tissue]
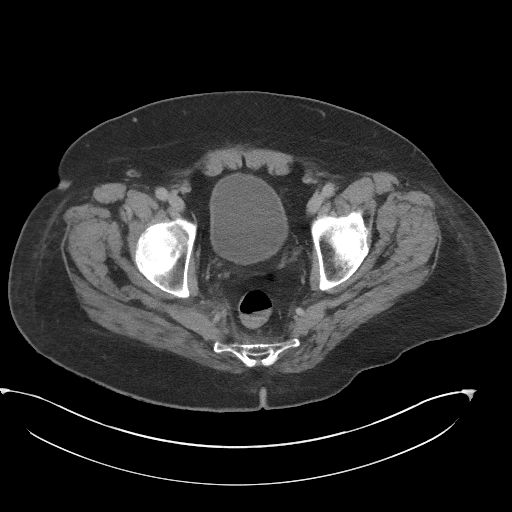
[im 32/104  soft-tissue]
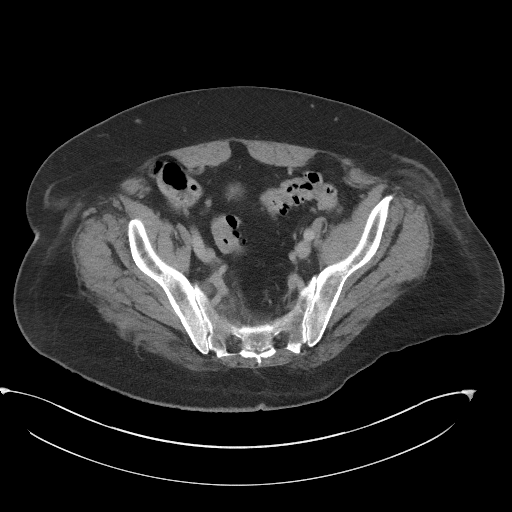
[im 40/104  soft-tissue]
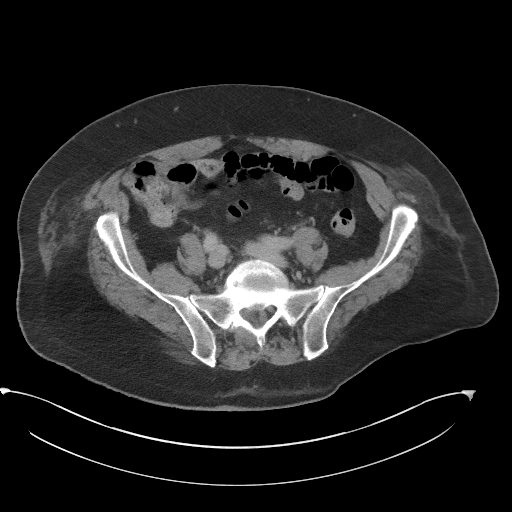
[im 48/104  soft-tissue]
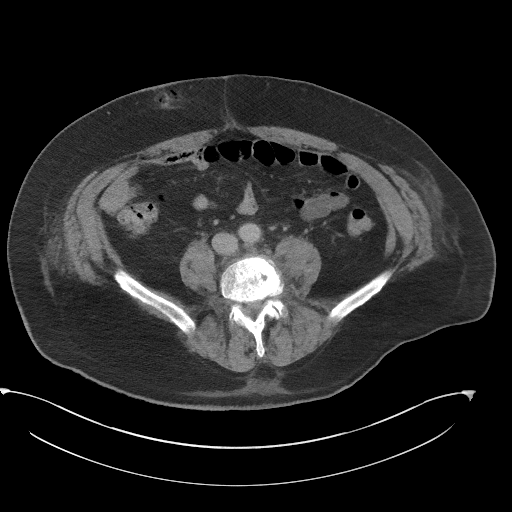
[im 56/104  soft-tissue]
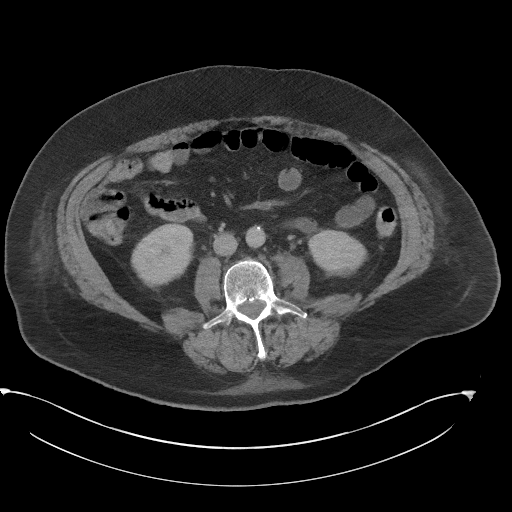
[im 64/104  soft-tissue]
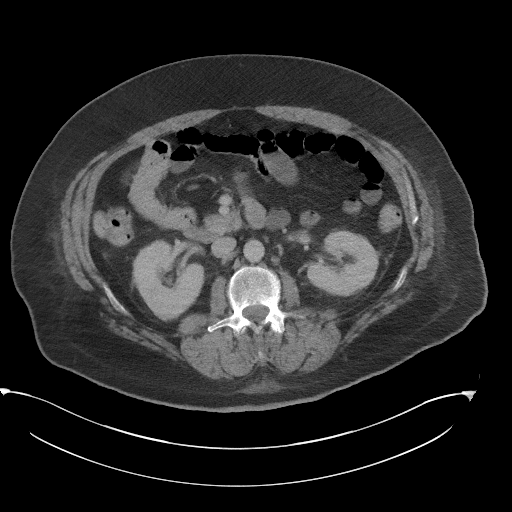
[im 72/104  soft-tissue]
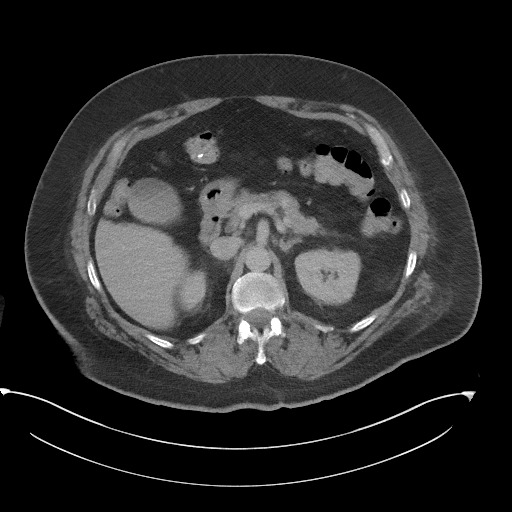
[im 72/104  bone]
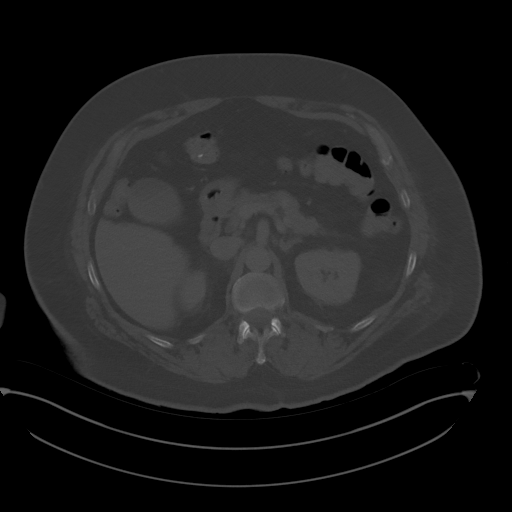
[im 80/104  soft-tissue]
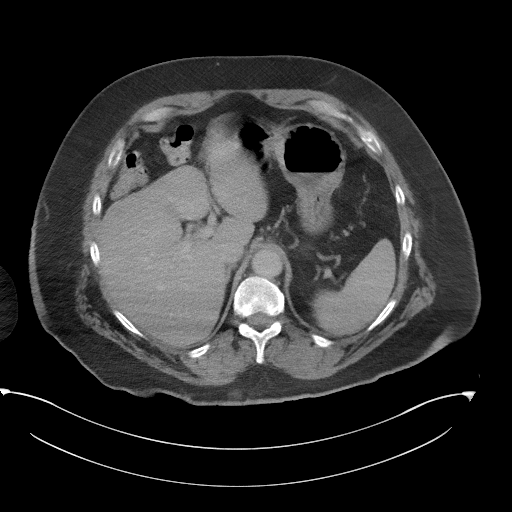
[im 88/104  soft-tissue]
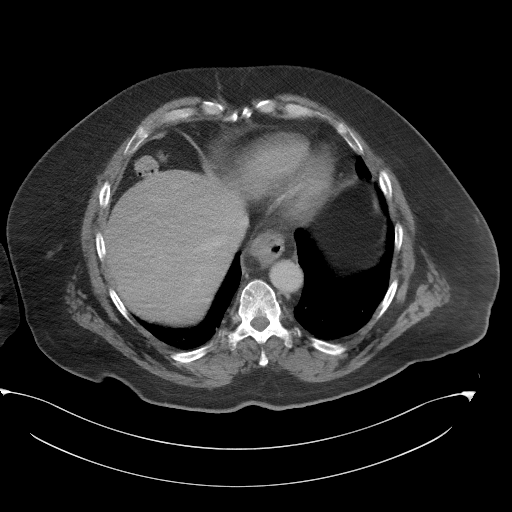
[im 96/104  soft-tissue]
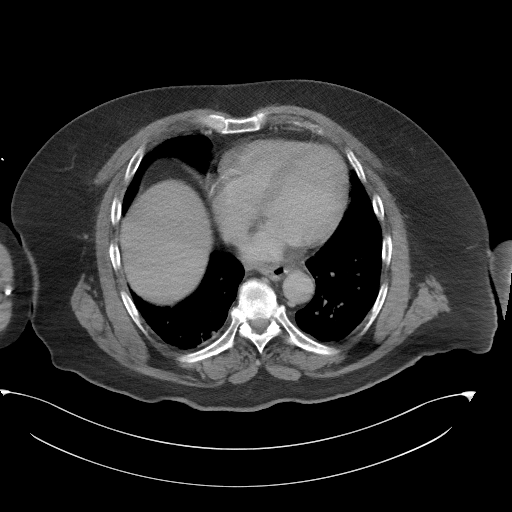

[Series 5: coronal st · coronal · 0.83mm/px · 3 of 106 slices shown]
[im 36/106  soft-tissue]
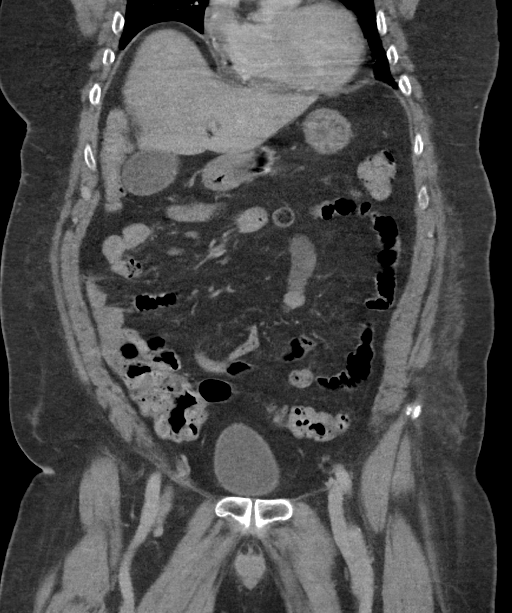
[im 47/106  soft-tissue]
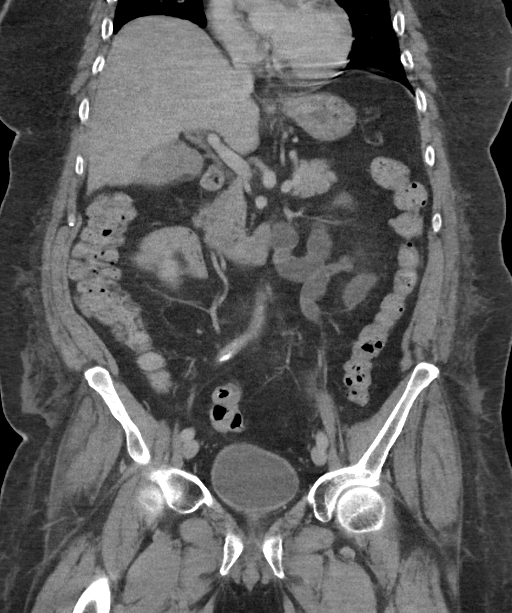
[im 59/106  soft-tissue]
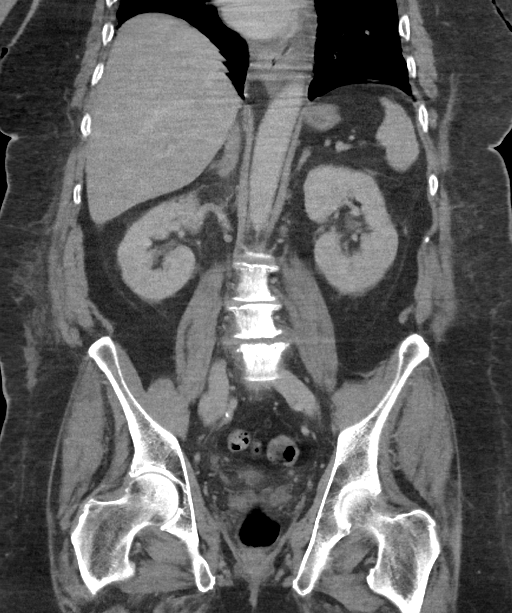

[15 of 46 positions shown; findings below may reference images not displayed]

FINDINGS: Lower chest: Clear.

Hepatobiliary: Gallbladder sludge is present. No calculi are
identified. Liver and bile ducts are within normal limits.

Pancreas: Unremarkable. No pancreatic ductal dilatation or
surrounding inflammatory changes.

Spleen: Normal in size without focal abnormality.

Adrenals/Urinary Tract: Adrenal glands are unremarkable. Kidneys are
normal, without renal calculi, focal lesion, or hydronephrosis.
Bladder is unremarkable.

Stomach/Bowel: There is a small hiatal hernia. Stomach is otherwise
within normal limits. Appendix appears normal. No evidence of bowel
wall thickening, distention, or inflammatory changes. There is
sigmoid and descending colon diverticulosis without evidence for
acute diverticulitis.

Vascular/Lymphatic: Aortic atherosclerosis. No enlarged abdominal or
pelvic lymph nodes.

Reproductive: Prostate is unremarkable.

Other: There is no ascites or free air. There is no significant
abdominal wall hernia. There is mild presacral edema. Injection site
noted in the subcutaneous tissues of the right anterior abdominal
wall.

Musculoskeletal: No acute fracture identified. There is disc space
narrowing and endplate sclerosis this cystic change at L3-L4 and
L4-L5. There is mild stranding and small lymph nodes in the anterior
paravertebral soft tissues at this level. No fluid collections are
identified.
IMPRESSION: 1. Presumably degenerative endplate changes at L3-L4 and L4-L5.
There are mild inflammatory changes and small lymph nodes in the
anterior paravertebral soft tissues at this level. If there is high
clinical concern for infection, recommend MRI.
2. Gallbladder sludge.
3. Colonic diverticulosis without evidence for acute diverticulitis.

## 2021-11-05 MED ORDER — ALBUTEROL SULFATE (2.5 MG/3ML) 0.083% IN NEBU
2.5000 mg | INHALATION_SOLUTION | Freq: Three times a day (TID) | RESPIRATORY_TRACT | Status: DC
  Administered 2021-11-06 – 2021-11-15 (×26): 2.5 mg via RESPIRATORY_TRACT
  Filled 2021-11-05 (×27): qty 3

## 2021-11-05 MED ORDER — LORAZEPAM 1 MG PO TABS
1.0000 mg | ORAL_TABLET | Freq: Four times a day (QID) | ORAL | Status: DC | PRN
  Administered 2021-11-05: 1 mg via ORAL
  Filled 2021-11-05: qty 1

## 2021-11-05 MED ORDER — IOHEXOL 350 MG/ML SOLN
80.0000 mL | Freq: Once | INTRAVENOUS | Status: AC | PRN
  Administered 2021-11-05: 80 mL via INTRAVENOUS

## 2021-11-05 MED ORDER — LACTATED RINGERS IV SOLN: INTRAVENOUS | Status: AC

## 2021-11-05 MED ORDER — HALOPERIDOL LACTATE 5 MG/ML IJ SOLN
2.0000 mg | Freq: Four times a day (QID) | INTRAMUSCULAR | Status: DC | PRN
  Administered 2021-11-05 – 2021-11-12 (×3): 2 mg via INTRAVENOUS
  Filled 2021-11-05 (×3): qty 1

## 2021-11-05 NOTE — Plan of Care (Signed)
  Problem: Education: Goal: Knowledge of General Education information will improve Description: Including pain rating scale, medication(s)/side effects and non-pharmacologic comfort measures Outcome: Not Progressing   Problem: Coping: Goal: Level of anxiety will decrease Outcome: Not Progressing   Problem: Safety: Goal: Ability to remain free from injury will improve Outcome: Not Progressing

## 2021-11-05 NOTE — Progress Notes (Signed)
Pt confused, noncompliant, agitated, and anxious. Pt removed, clothing, tubings including IV, and tele monitor. Pt got out of bed x two without assistance. Bed alarm alerted staff, pt safely redirected to bed. MD notified see order set.   Restraints ordered and applied. Pt assessed per protocol. Will continue with plan of care.   Lab attempted to draw ordered  trough for Vancomycin pt  restless and agitated. Lab unsuccessful. Pharmacy aware and order place for 11/17 am.

## 2021-11-05 NOTE — Evaluation (Signed)
Physical Therapy Evaluation Patient Details Name: Charles Weiss. MRN: [id number] DOB: 1951/07/19 Today's Date: 11/05/2021  History of Present Illness  70 yo male presents to [facility] on 11/13 with neck pain, headaches x3 days, recent diagnosis of flu with admission 11/4-8. CTA head/neck shows retropharyngeal edema without visible pharyngeal or spinal source, MRI shows spine diffuse lumbar epidural enhancement without abscess; L4-5 OM changes; C5-6 prevertebral enhancement no osseous finding. workup for sepsis and mrsa along with spinal osseous involvement/discitis. PMH includes HTN, HLD, asthma, GERD.  Clinical Impression   Pt presents with moderate to severe back pain, impaired activity tolerance, confusion with agitation/combativeness EOB, mod difficulty performing mobility tasks, and decreased activity tolerance vs baseline. Pt to benefit from acute PT to address deficits. Pt overall requiring mod +2 assist,  Pt became agitated and combative EOB and unsafely stood with PT and OT told pt to stop, peed all over the floor and required assist x4 to get pt safely returned to bed. PT to progress mobility as tolerated, and will continue to follow acutely.       Recommendations for follow up therapy are one component of a multi-disciplinary discharge planning process, led by the attending physician.  Recommendations may be updated based on patient status, additional functional criteria and insurance authorization.  Follow Up Recommendations Skilled nursing-short term rehab (<3 hours/day)    Assistance Recommended at Discharge Frequent or constant Supervision/Assistance  Functional Status Assessment Patient has had a recent decline in their functional status and demonstrates the ability to make significant improvements in function in a reasonable and predictable amount of time.  Equipment Recommendations  None recommended by PT    Recommendations for Other Services       Precautions /  Restrictions Precautions Precautions: Fall Restrictions Weight Bearing Restrictions: No      Mobility  Bed Mobility Overal bed mobility: Needs Assistance Bed Mobility: Rolling;Sidelying to Sit;Sit to Sidelying Rolling: Mod assist;+2 for physical assistance;+2 for safety/equipment Sidelying to sit: Mod assist;+2 for physical assistance;+2 for safety/equipment     Sit to sidelying: Mod assist;+2 for physical assistance;+2 for safety/equipment General bed mobility comments: mod +2 for log roll bilat for LE and truncal translation, trunk elevation/lowering moving to and from EOB, and LE management.    Transfers Overall transfer level: Needs assistance Equipment used: 2 person hand held assist Transfers: Sit to/from Stand Sit to Stand: Mod assist;+2 physical assistance           General transfer comment: mod +2 for steadying with rise, pt very unsafely and unsteadily reaching for environment to self-steady and stood against PT and OT wishes    Ambulation/Gait               General Gait Details: nt  Stairs            Wheelchair Mobility    Modified Rankin (Stroke Patients Only)       Balance Overall balance assessment: Needs assistance;History of Falls Sitting-balance support: No upper extremity supported;Feet supported Sitting balance-Leahy Scale: Fair     Standing balance support: Bilateral upper extremity supported;During functional activity Standing balance-Leahy Scale: Poor                               Pertinent Vitals/Pain Pain Assessment: Faces Faces Pain Scale: Hurts even more Pain Location: back Pain Descriptors / Indicators: Sore;Discomfort;Grimacing;Moaning Pain Intervention(s): Limited activity within patient's tolerance;Monitored during session;Repositioned    Home Living Family/patient expects  to be discharged to:: Private residence Living Arrangements: Other (Comment) Available Help at Discharge: Family Type of Home:  House Home Access: Stairs to enter Entrance Stairs-Rails: None Entrance Stairs-Number of Steps: 5-6 Alternate Level Stairs-Number of Steps: 13 Home Layout: Multi-level Home Equipment: Rollator (4 wheels);Grab bars - tub/shower Additional Comments: pt hoping to move to downstairs room when another roommate moves out, unsure of timing    Prior Function Prior Level of Function : Independent/Modified Independent;Patient poor historian/Family not available             Mobility Comments: Information mostly obtained from notes x1 week ago, pt not a reliable historian at this time. pt reports independent with community ambulation and transfers, sometimes uses rollator and sometimes doesn't. ADLs Comments: pt reports ind with ADLs/IADLs     Hand Dominance   Dominant Hand: Left    Extremity/Trunk Assessment   Upper Extremity Assessment Upper Extremity Assessment: Defer to OT evaluation    Lower Extremity Assessment Lower Extremity Assessment: Generalized weakness    Cervical / Trunk Assessment Cervical / Trunk Assessment: Other exceptions Cervical / Trunk Exceptions: forward flexed trunk, secondary to back pain  Communication   Communication: Expressive difficulties;Other (comment) (hard to understand)  Cognition Arousal/Alertness: Lethargic Behavior During Therapy: Restless;Impulsive;Agitated Overall Cognitive Status: Impaired/Different from baseline Area of Impairment: Orientation;Attention;Following commands;Safety/judgement;Problem solving                 Orientation Level: Disoriented to;Place;Situation;Time Current Attention Level: Focused   Following Commands: Follows one step commands inconsistently Safety/Judgement: Decreased awareness of safety;Decreased awareness of deficits   Problem Solving: Requires tactile cues;Requires verbal cues General Comments: pt able to state name and we are in [place], cannot state he is in hospital or why he is here. Pt is  very difficult to understand at this time, follows commands very inconsistently. Pt became agitated and combative EOB and unsafely stood with PT and OT told pt to stop, peed all over the floor        General Comments      Exercises     Assessment/Plan    PT Assessment Patient needs continued PT services  PT Problem List Decreased strength;Decreased mobility;Decreased safety awareness;Decreased coordination;Decreased range of motion;Decreased knowledge of precautions;Obesity;Decreased balance;Decreased knowledge of use of DME;Decreased activity tolerance;Decreased cognition;Pain       PT Treatment Interventions DME instruction;Gait training;Functional mobility training;Therapeutic activities;Therapeutic exercise;Balance training;Patient/family education    PT Goals (Current goals can be found in the Care Plan section)  Acute Rehab PT Goals PT Goal Formulation: With patient Time For Goal Achievement: 11/19/21 Potential to Achieve Goals: Good    Frequency Min 2X/week   Barriers to discharge        Co-evaluation PT/OT/SLP Co-Evaluation/Treatment: Yes Reason for Co-Treatment: To address functional/ADL transfers;For patient/therapist safety PT goals addressed during session: Mobility/safety with mobility;Balance OT goals addressed during session: ADL's and self-care       AM-PAC PT "6 Clicks" Mobility  Outcome Measure Help needed turning from your back to your side while in a flat bed without using bedrails?: A Little Help needed moving from lying on your back to sitting on the side of a flat bed without using bedrails?: A Little Help needed moving to and from a bed to a chair (including a wheelchair)?: A Little Help needed standing up from a chair using your arms (e.g., wheelchair or bedside chair)?: A Little Help needed to walk in hospital room?: A Little Help needed climbing 3-5 steps with a railing? : A Lot  6 Click Score: 17    End of Session   Activity Tolerance:  Patient limited by fatigue;Treatment limited secondary to agitation Patient left: in bed;with bed alarm set;with call bell/phone within reach Nurse Communication: Mobility status;Other (comment) (called RN phone, no response. PT sent RN secure chat regarding pt's confusion and agitation) PT Visit Diagnosis: Unsteadiness on feet (R26.81);Other abnormalities of gait and mobility (R26.89);Pain Pain - Right/Left:  (low) Pain - part of body:  (back)    Time: 2230-0979 PT Time Calculation (min) (ACUTE ONLY): 23 min   Charges:   PT Evaluation $PT Eval Moderate Complexity: 1 Mod        Kathaleya Mcduffee S, PT DPT Acute Rehabilitation Services Pager [phone]  Office [phone]   Glanda Spanbauer E Ruffin Pyo 11/05/2021, 9:53 AM

## 2021-11-05 NOTE — Evaluation (Signed)
Occupational Therapy Evaluation Patient Details Name: Charles Weiss. MRN: [id number] DOB: Jun 08, 1951 Today's Date: 11/05/2021   History of Present Illness 70 yo male presents to [facility] on 11/13 with neck pain, headaches x3 days, recent diagnosis of flu with admission 11/4-8. CTA head/neck shows retropharyngeal edema without visible pharyngeal or spinal source, MRI shows spine diffuse lumbar epidural enhancement without abscess; L4-5 OM changes; C5-6 prevertebral enhancement no osseous finding. workup for sepsis and mrsa along with spinal osseous involvement/discitis. PMH includes HTN, HLD, asthma, GERD.   Clinical Impression      Patient is a 70 year old male who presented with above. Patient was noted to have increased pain in back, decreased safety awareness, ability to follow commands and increased combative behaviors impacting ability to engage in ADLs. Patient was mod A x2 for for mobility with TD to participate in ADLs. Patient became agitated and combative during session on EOB with patient standing up against OT and PT wishes. Patient then proceeded to pee all over the floor with +4 to get back into bed safely at this time. Patient would continue to benefit from skilled OT services at this time while admitted and after d/c to address noted deficits in order to improve overall safety and independence in ADLs.     Recommendations for follow up therapy are one component of a multi-disciplinary discharge planning process, led by the attending physician.  Recommendations may be updated based on patient status, additional functional criteria and insurance authorization.   Follow Up Recommendations  Skilled nursing-short term rehab (<3 hours/day)    Assistance Recommended at Discharge Frequent or constant Supervision/Assistance  Functional Status Assessment  Patient has had a recent decline in their functional status and demonstrates the ability to make significant improvements in  function in a reasonable and predictable amount of time.  Equipment Recommendations  Other (comment) (TBD when patient is more appropirate)    Recommendations for Other Services       Precautions / Restrictions Precautions Precautions: Fall Restrictions Weight Bearing Restrictions: No      Mobility Bed Mobility Overal bed mobility: Needs Assistance Bed Mobility: Rolling;Sidelying to Sit;Sit to Sidelying Rolling: Mod assist;+2 for physical assistance;+2 for safety/equipment Sidelying to sit: Mod assist;+2 for physical assistance;+2 for safety/equipment     Sit to sidelying: Mod assist;+2 for physical assistance;+2 for safety/equipment General bed mobility comments: mod +2 for log roll bilat for LE and truncal translation, trunk elevation/lowering moving to and from EOB, and LE management.    Transfers Overall transfer level: Needs assistance Equipment used: 2 person hand held assist Transfers: Sit to/from Stand Sit to Stand: Mod assist;+2 physical assistance           General transfer comment: mod +2 for steadying with rise, pt very unsafely and unsteadily reaching for environment to self-steady and stood against PT and OT wishes. patient was noted to push back against support to maintain upright posture.      Balance Overall balance assessment: Needs assistance;History of Falls Sitting-balance support: No upper extremity supported;Feet supported Sitting balance-Leahy Scale: Fair Sitting balance - Comments: seated EOB   Standing balance support: Bilateral upper extremity supported;During functional activity Standing balance-Leahy Scale: Poor                             ADL either performed or assessed with clinical judgement   ADL Overall ADL's : Needs assistance/impaired   Eating/Feeding Details (indicate cue type and reason): unable  to assess Grooming: Wash/dry face;Maximal assistance Grooming Details (indicate cue type and reason): patient need  increased cues to attempt to wash faec with difficulty following directions Upper Body Bathing: Maximal assistance;Bed level   Lower Body Bathing: Maximal assistance;Bed level;Cueing for back precautions   Upper Body Dressing : Bed level;Maximal assistance   Lower Body Dressing: Bed level;Maximal assistance   Toilet Transfer: +2 for physical assistance;+2 for safety/equipment Toilet Transfer Details (indicate cue type and reason): patient stood while being educated to remain in bed with TD +2 to maintain standing while peeing on floor during behavioral outburst. patient was pressing fowards with poor safety awareness and inability to follow commands. patient as +4 to transition back to bed at this time with nursing staff.         Functional mobility during ADLs: +2 for physical assistance;+2 for safety/equipment       Vision   Additional Comments: unable to assess with patients current combative behaviors.     Perception     Praxis      Pertinent Vitals/Pain Pain Assessment: Faces Faces Pain Scale: Hurts even more Pain Location: back Pain Descriptors / Indicators: Sore;Discomfort;Grimacing;Moaning Pain Intervention(s): Limited activity within patient's tolerance;Monitored during session;Repositioned     Hand Dominance Left   Extremity/Trunk Assessment Upper Extremity Assessment Upper Extremity Assessment: Difficult to assess due to impaired cognition (patinet was noted to lift RUE overhead and push back on therapist with strong grasp during combative moments.)   Lower Extremity Assessment Lower Extremity Assessment: Generalized weakness   Cervical / Trunk Assessment Cervical / Trunk Assessment: Other exceptions Cervical / Trunk Exceptions: forward flexed trunk, secondary to back pain   Communication Communication Communication: Expressive difficulties;Other (comment)   Cognition Arousal/Alertness: Lethargic Behavior During Therapy:  Restless;Impulsive;Agitated Overall Cognitive Status: Impaired/Different from baseline Area of Impairment: Orientation;Attention;Following commands;Safety/judgement;Problem solving                 Orientation Level: Disoriented to;Place;Situation;Time Current Attention Level: Focused   Following Commands: Follows one step commands inconsistently Safety/Judgement: Decreased awareness of safety;Decreased awareness of deficits   Problem Solving: Requires tactile cues;Requires verbal cues General Comments: patient was able to state name and town. patient unable to communicate being in hospital. patient was difficult to understand with patient becoming aggitated and combative at EOB and unsteady standing with PT and this writter attempting to redirect.     General Comments       Exercises     Shoulder Instructions      Home Living Family/patient expects to be discharged to:: Private residence Living Arrangements: Other (Comment) Available Help at Discharge: Family Type of Home: House Home Access: Stairs to enter Technical brewer of Steps: 5-6 Entrance Stairs-Rails: None Home Layout: Multi-level Alternate Level Stairs-Number of Steps: 13 Alternate Level Stairs-Rails: Left Bathroom Shower/Tub: Tub/shower unit         Home Equipment: Rollator (4 wheels);Grab bars - tub/shower   Additional Comments: pt hoping to move to downstairs room when another roommate moves out, unsure of timing      Prior Functioning/Environment Prior Level of Function : Independent/Modified Independent;Patient poor historian/Family not available             Mobility Comments: Information mostly obtained from notes x1 week ago, pt not a reliable historian at this time. pt reports independent with community ambulation and transfers, sometimes uses rollator and sometimes doesn't. ADLs Comments: pt reports ind with ADLs/IADLs        OT Problem List: Decreased activity tolerance;Impaired  balance (  sitting and/or standing);Decreased cognition;Decreased safety awareness;Decreased knowledge of precautions;Decreased knowledge of use of DME or AE      OT Treatment/Interventions: Self-care/ADL training;Therapeutic exercise;Neuromuscular education;DME and/or AE instruction;Energy conservation;Therapeutic activities;Patient/family education;Balance training    OT Goals(Current goals can be found in the care plan section) Acute Rehab OT Goals Patient Stated Goal: to get up OT Goal Formulation: Patient unable to participate in goal setting Time For Goal Achievement: 11/19/21 Potential to Achieve Goals: Fair  OT Frequency: Min 2X/week   Barriers to D/C:            Co-evaluation PT/OT/SLP Co-Evaluation/Treatment: Yes Reason for Co-Treatment: To address functional/ADL transfers;For patient/therapist safety PT goals addressed during session: Mobility/safety with mobility OT goals addressed during session: ADL's and self-care      AM-PAC OT "6 Clicks" Daily Activity     Outcome Measure Help from another person eating meals?: A Little Help from another person taking care of personal grooming?: A Little Help from another person toileting, which includes using toliet, bedpan, or urinal?: A Lot Help from another person bathing (including washing, rinsing, drying)?: A Lot Help from another person to put on and taking off regular upper body clothing?: A Lot Help from another person to put on and taking off regular lower body clothing?: A Lot 6 Click Score: 14   End of Session Nurse Communication: Other (comment) (nurse cleared patient to participate and then nursing staff present when patient became combative)  Activity Tolerance: Treatment limited secondary to agitation Patient left: in bed;with call bell/phone within reach;with nursing/sitter in room  OT Visit Diagnosis: Unsteadiness on feet (R26.81);Other abnormalities of gait and mobility (R26.89);Muscle weakness (generalized)  (M62.81);History of falling (Z91.81)                Time: 5852-7782 OT Time Calculation (min): 26 min Charges:  OT General Charges $OT Visit: 1 Visit OT Evaluation $OT Eval Moderate Complexity: 1 Mod  Jackelyn Poling OTR/L, MS Acute Rehabilitation Department Office# [phone] Pager# [phone]   [place] 11/05/2021, 12:09 PM

## 2021-11-05 NOTE — TOC Initial Note (Signed)
Transition of Care (TOC) - Initial/Assessment Note    Patient Details  Name: Charles Weiss. MRN: [id number] Date of Birth: [birth year]  [facility] CM/SW Contact:    Leeroy Cha, RN Phone Number: 11/05/2021, 9:15 AM  Clinical Narrative:                 11/15 assessment Appears somewhat more confused today, but nonfocal on neurological exam otherwise 11/14 bcx repeat in progress 11/14 mri brain difficult but no obvious abscess 11/14 tte technically difficult study, but no obvious vegetation. Deferring tee unless recurrent bacteremia beyond 3 days     Plan: F/u repeat bcx from 11/14 Continue vancomycin; will check daptomycin susceptibility (requested 11/15) If confusion worse/persists, consider repeat brain mri in 3-4 days, r/o septic emboli/brain abscess as that potentially could change antimicrobial management If persistent bacteremia >3 days from admission, will need tee and abd/pelv imaging Discussed with primary care    Principal Problem:   Sepsis ([place]) Active Problems:   Hypertension   Hyperlipidemia   Asthma   Acute respiratory failure with hypoxia ([place])   Vertebral osteomyelitis ([place])   MRSA bacteremia   Retropharyngeal abscess   TOC PLAN OF CARE: Patient should be able to return home once stable. Urine culture is showing methicillin resistant staph. Auresis Wbc-21.4 on Vanco ready iv Following for progression.  Expected Discharge Plan: Home/Self Care     Patient Goals and CMS Choice Patient states their goals for this hospitalization and ongoing recovery are:: to go home CMS Medicare.gov Compare Post Acute Care list provided to:: Patient Choice offered to / list presented to : Patient  Expected Discharge Plan and Services Expected Discharge Plan: Home/Self Care   Discharge Planning Services: CM Consult   Living arrangements for the past 2 months: Single Family Home                                      Prior  Living Arrangements/Services Living arrangements for the past 2 months: Single Family Home Lives with:: Self Patient language and need for interpreter reviewed:: Yes Do you feel safe going back to the place where you live?: Yes            Criminal Activity/Legal Involvement Pertinent to Current Situation/Hospitalization: No - Comment as needed  Activities of Daily Living Home Assistive Devices/Equipment: Nebulizer, Eyeglasses ADL Screening (condition at time of admission) Patient's cognitive ability adequate to safely complete daily activities?: No Is the patient deaf or have difficulty hearing?: No Does the patient have difficulty seeing, even when wearing glasses/contacts?: No Does the patient have difficulty concentrating, remembering, or making decisions?: No Patient able to express need for assistance with ADLs?: Yes Does the patient have difficulty dressing or bathing?: Yes Independently performs ADLs?: No Communication: Independent Dressing (OT): Needs assistance Is this a change from baseline?: Change from baseline, expected to last <3days Grooming: Needs assistance Is this a change from baseline?: Change from baseline, expected to last <3 days Feeding: Independent Bathing: Needs assistance Is this a change from baseline?: Change from baseline, expected to last <3 days Toileting: Needs assistance Is this a change from baseline?: Change from baseline, expected to last <3 days In/Out Bed: Needs assistance Is this a change from baseline?: Change from baseline, expected to last <3 days Walks in Home: Independent Does the patient have difficulty walking or climbing stairs?: Yes Weakness of Legs: Both (r/t back  pain) Weakness of Arms/Hands: None  Permission Sought/Granted                  Emotional Assessment Appearance:: Appears stated age Attitude/Demeanor/Rapport: Engaged Affect (typically observed): Calm Orientation: : Oriented to Place, Oriented to Self,  Oriented to  Time, Oriented to Situation Alcohol / Substance Use: Not Applicable Psych Involvement: No (comment)  Admission diagnosis:  Neck pain [M54.2] Sepsis ([place]) [A41.9] Urinary tract infection with hematuria, site unspecified [N39.0, R31.9] Sepsis without acute organ dysfunction, due to unspecified organism [facility]) [A41.9] Patient Active Problem List   Diagnosis Date Noted   Vertebral osteomyelitis ([place])    MRSA bacteremia    Retropharyngeal abscess    Sepsis ([place]) 11/02/2021   Asthma 10/25/2021   Acute respiratory failure with hypoxia ([place]) 10/25/2021   Influenza A 10/24/2021   Hypertension    Hyperlipidemia    Hypokalemia    Normocytic anemia    Hyponatremia    PCP:  System, Provider Not In Pharmacy:   [id number] - [place], [place] [address] Phone: [phone] Fax: [phone]     Social Determinants of Health (SDOH) Interventions    Readmission Risk Interventions Readmission Risk Prevention Plan 11/03/2021  Transportation Screening Complete  PCP or Specialist Appt within 3-5 Days Complete  HRI or [place] Complete  Social Work Consult for [place] Planning/Counseling Complete  Palliative Care Screening Not Applicable  Medication Review Press photographer) Complete

## 2021-11-05 NOTE — Progress Notes (Signed)
[facility]  Date of Admission:  11/02/2021      Abx: 11/13-c vanc                                                        Assessment: 70 yo male with asthma, gerd, htn/hlp, recent influenza pna admitted to ED 11/13 with neck pain found to have sepsis and mrsa bsi along with diffuse spinal osseous involvement and retropharyngeal space edema    Mri spine diffuse lumbar epidural enhancement without abscess. L4-5 OM changes. C5-6 prevertebral enhancement no osseous finding   No neurologic deficit. No ams or clinical concern of meningitis at this time   Doesn't appear to be a superimposed mrsa bacterial pna. Unclear source   11/13 bcx mrsa   No hardware/cardiac device  --------- 11/16 assessment Mentation seems to improve with better pain control Complains of headache/neck/lower back pain  Right sided abd tenderness without rebound  At this time on appropriate with vanc even if cns involvement. However if abscess will need double abx coverage 11/15 bcx repeat ngtd 11/13 ucx mrsa descending involvement from mrsa septicemia -- primary team getting abd/pelv ct for abd tenderness, will see if kidney is involved  11/14 mri brain difficult but no obvious abscess 11/14 tte technically difficult study, but no obvious vegetation.  Deferring tee unless recurrent bacteremia beyond 3 days    Plan: Continue vancomycin; f/u daptomycin susceptibility (requested 11/15) Will continue daily evaluation for cns sx and repeat mri end of this week if persistent/worsening headache or other cns sx Await abd/pelv ct scan F/u repeat bcx result Discussed with primary care    Principal Problem:   Sepsis ([place]) Active Problems:   Hypertension   Hyperlipidemia   Asthma   Acute respiratory failure with hypoxia ([place])   Vertebral osteomyelitis ([place])   MRSA bacteremia   Retropharyngeal abscess   Allergies  Allergen Reactions   Elavil [Amitriptyline] Other  (See Comments)   Tetracyclines & Related Other (See Comments)    Scheduled Meds:  amLODipine  5 mg Oral Daily   atorvastatin  20 mg Oral Daily   enoxaparin (LOVENOX) injection  60 mg Subcutaneous Q24H   losartan  50 mg Oral Daily   mometasone-formoterol  2 puff Inhalation BID   sodium chloride flush  3 mL Intravenous Q12H   Continuous Infusions:  sodium chloride 10 mL/hr at 11/03/21 1032   lactated ringers 100 mL/hr at 11/05/21 0359   vancomycin 1,000 mg (11/05/21 1021)   PRN Meds:.sodium chloride, acetaminophen **OR** acetaminophen, albuterol, LORazepam, morphine injection, naLOXone (NARCAN)  injection, ondansetron **OR** ondansetron (ZOFRAN) IV, oxyCODONE-acetaminophen, senna-docusate   SUBJECTIVE: Stable moderate headache/back pain Right sided abd pain No n/v/diarrhea No visual change  Intermittent confusion better controlled with pain meds  Review of Systems: ROS All other ROS was negative, except mentioned above     OBJECTIVE: Vitals:   11/05/21 0017 11/05/21 0506 11/05/21 0757 11/05/21 1012  BP: (!) 165/99 (!) 157/87  (!) 158/96  Pulse: (!) 105 100    Resp: 20 17    Temp: 98.2 F (36.8 C) 100.1 F (37.8 C)    TempSrc: Oral Oral    SpO2: 93% 94% 93%   Weight:      Height:       Body  mass index is 33.91 kg/m.  Physical Exam General/constitutional: no distress, pleasant, conversant; appropriate/cooperative HEENT: Normocephalic, PER, Conj Clear, EOMI, Oropharynx clear Neck supple -- slightly stiff on forward flexion CV: rrr no mrg Lungs: clear to auscultation, normal respiratory effort Abd: Soft, slightly tender right side no rebound Ext: no edema Skin: No Rash Neuro: nonfocal -- no confusion at the time we examined him MSK: no peripheral joint swelling/tenderness/warmth; mild cervical and lumbar spine area tenderness  Central line presence: no  Lab Results Lab Results  Component Value Date   WBC 21.4 (H) 11/05/2021   HGB 11.9 (L) 11/05/2021    HCT 34.0 (L) 11/05/2021   MCV 87.4 11/05/2021   PLT 243 11/05/2021    Lab Results  Component Value Date   CREATININE 0.72 11/05/2021   BUN 22 11/05/2021   NA 130 (L) 11/05/2021   K 4.5 11/05/2021   CL 95 (L) 11/05/2021   CO2 26 11/05/2021    Lab Results  Component Value Date   ALT 30 11/03/2021   AST 19 11/03/2021   ALKPHOS 68 11/03/2021   BILITOT 1.1 11/03/2021      Microbiology: Recent Results (from the past 240 hour(s))  Blood culture (routine x 2)     Status: Abnormal (Preliminary result)   Collection Time: 11/02/21 12:12 PM   Specimen: BLOOD  Result Value Ref Range Status   Specimen Description   Final    BLOOD RIGHT ANTECUBITAL Performed at [facility], [address], [place], [place] [address]    Special Requests   Final    BOTTLES DRAWN AEROBIC AND ANAEROBIC Blood Culture results may not be optimal due to an excessive volume of blood received in culture bottles Performed at [facility] [address], [place], [place] [address]    Culture  Setup Time   Final    GRAM POSITIVE COCCI IN CLUSTERS IN BOTH AEROBIC AND ANAEROBIC BOTTLES CRITICAL RESULT CALLED TO, READ BACK BY AND VERIFIED WITH: M LILLISTON,PHARMD@0432  11/03/21 [place]    Culture (A)  Final    METHICILLIN RESISTANT STAPHYLOCOCCUS AUREUS Sent to [facility] for further susceptibility testing. Performed at [facility], [place] [address] [place], [place] [address]    Report Status PENDING  Incomplete   Organism ID, Bacteria METHICILLIN RESISTANT STAPHYLOCOCCUS AUREUS  Final      Susceptibility   Methicillin resistant staphylococcus aureus - MIC*    CIPROFLOXACIN >=8 RESISTANT Resistant     ERYTHROMYCIN >=8 RESISTANT Resistant     GENTAMICIN <=0.5 SENSITIVE Sensitive     OXACILLIN >=4 RESISTANT Resistant     TETRACYCLINE <=1 SENSITIVE Sensitive     VANCOMYCIN 1 SENSITIVE Sensitive     TRIMETH/SULFA <=10 SENSITIVE Sensitive     CLINDAMYCIN <=0.25 SENSITIVE  Sensitive     RIFAMPIN <=0.5 SENSITIVE Sensitive     Inducible Clindamycin NEGATIVE Sensitive     * METHICILLIN RESISTANT STAPHYLOCOCCUS AUREUS  Blood Culture ID Panel (Reflexed)     Status: Abnormal   Collection Time: 11/02/21 12:12 PM  Result Value Ref Range Status   Enterococcus faecalis NOT DETECTED NOT DETECTED Final   Enterococcus Faecium NOT DETECTED NOT DETECTED Final   Listeria monocytogenes NOT DETECTED NOT DETECTED Final   Staphylococcus species DETECTED (A) NOT DETECTED Final    Comment: CRITICAL RESULT CALLED TO, READ BACK BY AND VERIFIED WITH: M LILLISTON,PHARMD@0431  11/03/21 [place]    Staphylococcus aureus (BCID) DETECTED (A) NOT DETECTED Final    Comment: Methicillin (oxacillin)-resistant Staphylococcus aureus (  MRSA). MRSA is predictably resistant to beta-lactam antibiotics (except ceftaroline). Preferred therapy is vancomycin unless clinically contraindicated. Patient requires contact precautions if  hospitalized. CRITICAL RESULT CALLED TO, READ BACK BY AND VERIFIED WITH: M LILLISTON,PHARMD@0431  11/03/21 [place]    Staphylococcus epidermidis NOT DETECTED NOT DETECTED Final   Staphylococcus lugdunensis NOT DETECTED NOT DETECTED Final   Streptococcus species NOT DETECTED NOT DETECTED Final   Streptococcus agalactiae NOT DETECTED NOT DETECTED Final   Streptococcus pneumoniae NOT DETECTED NOT DETECTED Final   Streptococcus pyogenes NOT DETECTED NOT DETECTED Final   A.calcoaceticus-baumannii NOT DETECTED NOT DETECTED Final   Bacteroides fragilis NOT DETECTED NOT DETECTED Final   Enterobacterales NOT DETECTED NOT DETECTED Final   Enterobacter cloacae complex NOT DETECTED NOT DETECTED Final   Escherichia coli NOT DETECTED NOT DETECTED Final   Klebsiella aerogenes NOT DETECTED NOT DETECTED Final   Klebsiella oxytoca NOT DETECTED NOT DETECTED Final   Klebsiella pneumoniae NOT DETECTED NOT DETECTED Final   Proteus species NOT DETECTED NOT DETECTED Final   Salmonella species NOT  DETECTED NOT DETECTED Final   Serratia marcescens NOT DETECTED NOT DETECTED Final   Haemophilus influenzae NOT DETECTED NOT DETECTED Final   Neisseria meningitidis NOT DETECTED NOT DETECTED Final   Pseudomonas aeruginosa NOT DETECTED NOT DETECTED Final   Stenotrophomonas maltophilia NOT DETECTED NOT DETECTED Final   Candida albicans NOT DETECTED NOT DETECTED Final   Candida auris NOT DETECTED NOT DETECTED Final   Candida glabrata NOT DETECTED NOT DETECTED Final   Candida krusei NOT DETECTED NOT DETECTED Final   Candida parapsilosis NOT DETECTED NOT DETECTED Final   Candida tropicalis NOT DETECTED NOT DETECTED Final   Cryptococcus neoformans/gattii NOT DETECTED NOT DETECTED Final   Meth resistant mecA/C and MREJ DETECTED (A) NOT DETECTED Final    Comment: CRITICAL RESULT CALLED TO, READ BACK BY AND VERIFIED WITH: M LILLISTON,PHARMD@0431  11/03/21 [place] Performed at [facility], [address], [place], [place] [address]   Blood culture (routine x 2)     Status: Abnormal   Collection Time: 11/02/21 12:17 PM   Specimen: BLOOD  Result Value Ref Range Status   Specimen Description   Final    BLOOD LEFT ANTECUBITAL Performed at [facility] [address] [place], [place] [address]    Special Requests   Final    BOTTLES DRAWN AEROBIC AND ANAEROBIC Blood Culture adequate volume Performed at [facility] [address] [place], [place] [address]    Culture  Setup Time   Final    GRAM POSITIVE COCCI IN CLUSTERS IN BOTH AEROBIC AND ANAEROBIC BOTTLES CRITICAL VALUE NOTED.  VALUE IS CONSISTENT WITH PREVIOUSLY REPORTED AND CALLED VALUE.    Culture (A)  Final    STAPHYLOCOCCUS AUREUS SUSCEPTIBILITIES PERFORMED ON PREVIOUS CULTURE WITHIN THE LAST 5 DAYS. Performed at [facility], [place] [address], [place], [place] [address]    Report Status 11/05/2021 FINAL  Final  Resp Panel by RT-PCR (Flu A&B, Covid) Nasopharyngeal Swab     Status: None    Collection Time: 11/02/21  4:16 PM   Specimen: Nasopharyngeal Swab; Nasopharyngeal(NP) swabs in vial transport medium  Result Value Ref Range Status   SARS Coronavirus 2 by RT PCR NEGATIVE NEGATIVE Final    Comment: (NOTE) SARS-CoV-2 target nucleic acids are NOT DETECTED.  The SARS-CoV-2 RNA is generally detectable in upper respiratory specimens during the acute phase of infection. The lowest concentration of SARS-CoV-2 viral copies this assay can detect is 138 copies/mL.  A negative result does not preclude SARS-Cov-2 infection and should not be used as the sole basis for treatment or other patient management decisions. A negative result may occur with  improper specimen collection/handling, submission of specimen other than nasopharyngeal swab, presence of viral mutation(s) within the areas targeted by this assay, and inadequate number of viral copies(<138 copies/mL). A negative result must be combined with clinical observations, patient history, and epidemiological information. The expected result is Negative.  Fact Sheet for Patients:  EntrepreneurPulse.com.au  Fact Sheet for Healthcare Providers:  IncredibleEmployment.be  This test is no t yet approved or cleared by the [place] FDA and  has been authorized for detection and/or diagnosis of SARS-CoV-2 by FDA under an Emergency Use Authorization (EUA). This EUA will remain  in effect (meaning this test can be used) for the duration of the COVID-19 declaration under Section 564(b)(1) of the Act, 21 U.S.C.section 360bbb-3(b)(1), unless the authorization is terminated  or revoked sooner.       Influenza A by PCR NEGATIVE NEGATIVE Final   Influenza B by PCR NEGATIVE NEGATIVE Final    Comment: (NOTE) The Xpert Xpress SARS-CoV-2/FLU/RSV plus assay is intended as an aid in the diagnosis of influenza from Nasopharyngeal swab specimens and should not be used as a sole basis for treatment.  Nasal washings and aspirates are unacceptable for Xpert Xpress SARS-CoV-2/FLU/RSV testing.  Fact Sheet for Patients: EntrepreneurPulse.com.au  Fact Sheet for Healthcare Providers: IncredibleEmployment.be  This test is not yet approved or cleared by the [place] FDA and has been authorized for detection and/or diagnosis of SARS-CoV-2 by FDA under an Emergency Use Authorization (EUA). This EUA will remain in effect (meaning this test can be used) for the duration of the COVID-19 declaration under Section 564(b)(1) of the Act, 21 U.S.C. section 360bbb-3(b)(1), unless the authorization is terminated or revoked.  Performed at [facility], [place] [address], [place], [place] [address]   Urine Culture     Status: Abnormal   Collection Time: 11/02/21  4:33 PM   Specimen: In/Out Cath Urine  Result Value Ref Range Status   Specimen Description   Final    IN/OUT CATH URINE Performed at [facility] [address] [place], [place] [address]    Special Requests   Final    NONE Performed at [facility], [place] [address] [place], [place] [address]    Culture (A)  Final    >=100,000 COLONIES/mL METHICILLIN RESISTANT STAPHYLOCOCCUS AUREUS   Report Status 11/05/2021 FINAL  Final   Organism ID, Bacteria METHICILLIN RESISTANT STAPHYLOCOCCUS AUREUS (A)  Final      Susceptibility   Methicillin resistant staphylococcus aureus - MIC*    CIPROFLOXACIN >=8 RESISTANT Resistant     GENTAMICIN <=0.5 SENSITIVE Sensitive     NITROFURANTOIN <=16 SENSITIVE Sensitive     OXACILLIN >=4 RESISTANT Resistant     TETRACYCLINE <=1 SENSITIVE Sensitive     VANCOMYCIN 1 SENSITIVE Sensitive     TRIMETH/SULFA <=10 SENSITIVE Sensitive     CLINDAMYCIN <=0.25 SENSITIVE Sensitive     RIFAMPIN <=0.5 SENSITIVE Sensitive     Inducible Clindamycin NEGATIVE Sensitive     * >=100,000 COLONIES/mL METHICILLIN RESISTANT  STAPHYLOCOCCUS AUREUS  Culture, blood (routine x 2)     Status: None (Preliminary result)   Collection Time: 11/04/21  5:19 AM   Specimen: BLOOD RIGHT HAND  Result Value Ref Range Status   Specimen Description   Final    BLOOD RIGHT HAND Performed  at [facility], [place] [address] [place], [place] [address]    Special Requests   Final    BOTTLES DRAWN AEROBIC ONLY Blood Culture adequate volume Performed at [facility] [address] [place], [place] [address]    Culture   Final    NO GROWTH 1 DAY Performed at [facility], [place] [address] [place], [place] [address]    Report Status PENDING  Incomplete  Culture, blood (routine x 2)     Status: None (Preliminary result)   Collection Time: 11/04/21  5:19 AM   Specimen: BLOOD LEFT HAND  Result Value Ref Range Status   Specimen Description   Final    BLOOD LEFT HAND Performed at [facility], [place] [address] [place], [place] [address]    Special Requests   Final    BOTTLES DRAWN AEROBIC ONLY Blood Culture adequate volume Performed at [facility] [address], [place], [place] [address]    Culture   Final    NO GROWTH 1 DAY Performed at [facility], [place] [address] [place], [place] [address]    Report Status PENDING  Incomplete     Serology:   Imaging: If present, new imagings (plain films, ct scans, and mri) have been personally visualized and interpreted; radiology reports have been reviewed. Decision making incorporated into the Impression / Recommendations.  11/13 cxr Bilateral patchy airspace disease is likely slightly decreased compared with prior exam, although evaluation is somewhat limited due to differences in technique. Recommend follow-up PA and lateral chest x-ray in 6-8 weeks to ensure complete resolution.   11/13 ct angio head/neck 1. Retropharyngeal edema without visible pharyngeal or spinal source, suggest enhanced cervical MRI and  inflammatory labs. 2. Mild for age atherosclerosis.   11/14 mri entire spine 1. Fluid and enhancement within the disc space at L4-5 with adjacent edema and enhancement of the endplates. 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 2. Extensive epidural enhancement in the lower lumbar spine L2-3 through the sacrum compatible with infection. Recommend lumbar puncture further evaluation. 3. Prevertebral edema with peripheral postcontrast enhancement extending from the skull base through C5-6. Focal source for infection in the upper cervical spine not identified. 4. The study is moderately degraded by patient motion. 5. 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 6. Diffuse epidural enhancement at L2-3, L3-4, L4-5, and L5-S1 without discrete epidural abscess. 7. Moderate central canal and bilateral foraminal stenosis at L3-4. 8. Mild foraminal narrowing bilaterally at T7-8, T8-9 and T9-10 secondary to facet spurring.    11/14 mri brain 1. Severely motion degraded examination without evidence of acute intracranial abnormality. 2. Mild-to-moderate chronic small vessel ischemic disease.  11/15 tte  1. Left ventricular ejection fraction, by estimation, is 55 to 60%. The  left ventricle has normal function. The left ventricle has no regional  wall motion abnormalities. There is mild concentric left ventricular  hypertrophy. Left ventricular diastolic  parameters are consistent with Grade I diastolic dysfunction (impaired  relaxation).   2. Right ventricular systolic function is normal. The right ventricular  size is normal.   3. The mitral valve is normal in structure. No evidence of mitral valve  regurgitation. No evidence of mitral stenosis.   4. The aortic valve is normal in structure. Aortic valve regurgitation is  not visualized. No aortic stenosis is present.   5. There is borderline dilatation of the ascending aorta, measuring 36  mm.   6. The inferior  vena cava is normal in size with greater than 50%  respiratory variability, suggesting right atrial pressure of 3 mmHg.   Jabier Mutton, [place] [facility] [place] [phone] pager    11/05/2021, 12:51 PM

## 2021-11-05 NOTE — Progress Notes (Signed)
PROGRESS NOTE    Charles Weiss [place].  UXN:[id number] DOB: 1951/11/02 DOA: 11/02/2021 PCP: System, Provider Not In  Brief Narrative:  The patient is a 70 year old Caucasian male with a past medical history significant for but limited to asthma, hypertension as well as other comorbidities with recent admission for influenza pneumonia and respiratory failure who came back to the emergency room with neck pain and headache.  He is noted to have a high fever and upon further work-up he is found to have evidence of discitis in the lumbar vertebra.  Blood cultures were positive for MRSA.  He was started on vancomycin and ID was consulted.  He is undergoing further work-up and his mental status is a little off and he is agitated.  MRI was unrevealing.  ID recommending repeating MRI later this week if mentation has not improved.  Assessment & Plan:   Principal Problem:   Sepsis ([place]) Active Problems:   Hypertension   Hyperlipidemia   Asthma   Acute respiratory failure with hypoxia (HCC)   Vertebral osteomyelitis (HCC)   MRSA bacteremia   Retropharyngeal abscess   Right lower quadrant abdominal pain  Sepsis, secondary to MRSA bacteremia -Noted to be febrile with T-max of 103.4, tachycardic heart rate in the 110s, tachypneic source of infection is discitis.  Evidence of endorgan damage includes encephalopathy related to sepsis. -Infectious disease following and recommending continuing daily evaluation for CNS symptoms and repeat MRI at the end of the week if persistent or worsening headache or other CNS symptoms -Currently on vancomycin and ID is following  -Repeat blood culture sent yesterday -Acute urinary 100 MLS per hour but will reduce to 75 MLS per hour -Will eventually need PICC line once cultures have cleared -2D echocardiogram performed that was unremarkable -PT OT recommending SNF   Discitis/vertebral osteomyelitis -Noted to have significant discitis and lumbar  vertebral/sacrum -Also noted to have significant epidural enhancement -Continue on IV antibiotics total of 8 weeks -Discussed with infectious disease/neurology was not felt that lumbar puncture would change management at this point -Checking ECHOCardiogram as above -Repeat blood cultures x2   Abdominal pain -Noted to have diffuse abdominal tenderness -Unclear if this is contributing to underlying sepsis -Check CT abdomen and showed "Presumably degenerative endplate changes at U5-K2 and L4-L5. There are mild inflammatory changes and small lymph nodes in the anterior paravertebral soft tissues at this level. If there is high clinical concern for infection, recommend MRI.  Gallbladder sludge. Colonic diverticulosis without evidence for acute diverticulitis." -ID is continuing IV vancomycin and recommending follow-up with Romycin sensibilities   Acute Encephalopathy with Agitation -Related to sepsis -He is intermittently agitated and confused and combative -C/w Lorazepam 1 mg po/IV q6hprn  -Place on Delirium Precautions -MRI Brain done and showed "Severely motion degraded examination without evidence of acute intracranial abnormality. Mild-to-moderate chronic small vessel ischemic disease."  -Continue to monitor -Could be related to pain as his mentation improved with better pain control -ID recommending repeating MRI at the end of this week if he continues with persistent worsening headache or other CNS symptoms   Hypertension -Currently on amlodipine and losartan and being continued on both of those with amlodipine 5 mg p.o. daily and losartan 50 mg p.o. daily -Holding hydrochlorothiazide since it is likely following completed -Blood pressure likely further elevated due to pain -Continue to Monitor BP per Protocol -Last BP reading was 158/96   Hyperlipidemia -Continued on atorvastatin 20 g p.o. daily   Sore throat -Noted on CT scan to  have retropharyngeal erythema.  No comment of  obvious abscess no CT -EDP discussed with ENT who recommended conservative management with IV antibiotics -Was also recommended that he receive Decadron 8 mg every 8 hours x 3 doses -If symptoms worsen over the next 24 to 48 hours, recommendations are for reimaging but did not complain of Sore Throat today  -Continue to Monitor closely    Asthma -Currently no wheezing -Continue on bronchodilators, Dulera -Continue to Monitor Respiratory Status carefully   Obesity -Complicates overall prognosis and care -Estimated body mass index is 33.91 kg/m as calculated from the following:   Height as of this encounter: 6' (1.829 m).   Weight as of this encounter: 113.4 kg. -Weight loss and Dietary Counseling given    DVT prophylaxis: Enoxaparin Code Status: DO NOT RESUSCITATE Family Communication: No family currently at bedside Disposition Plan: Pending further clinical improvement and clearance by ID  Status is: Inpatient  Remains inpatient appropriate because: Remains somewhat confused and encephalopathic we will need ID clearance and antibiotic recommendations prior to safe discharge disposition   Consultants:  Infectious diseases  Procedures:  ECHOCARDIOGRAM IMPRESSIONS     1. Left ventricular ejection fraction, by estimation, is 55 to 60%. The  left ventricle has normal function. The left ventricle has no regional  wall motion abnormalities. There is mild concentric left ventricular  hypertrophy. Left ventricular diastolic  parameters are consistent with Grade I diastolic dysfunction (impaired  relaxation).   2. Right ventricular systolic function is normal. The right ventricular  size is normal.   3. The mitral valve is normal in structure. No evidence of mitral valve  regurgitation. No evidence of mitral stenosis.   4. The aortic valve is normal in structure. Aortic valve regurgitation is  not visualized. No aortic stenosis is present.   5. There is borderline dilatation  of the ascending aorta, measuring 36  mm.   6. The inferior vena cava is normal in size with greater than 50%  respiratory variability, suggesting right atrial pressure of 3 mmHg.   Comparison(s): No prior Echocardiogram.   FINDINGS   Left Ventricle: Left ventricular ejection fraction, by estimation, is 55  to 60%. The left ventricle has normal function. The left ventricle has no  regional wall motion abnormalities. Definity contrast agent was given IV  to delineate the left ventricular   endocardial borders. The left ventricular internal cavity size was normal  in size. There is mild concentric left ventricular hypertrophy. Left  ventricular diastolic parameters are consistent with Grade I diastolic  dysfunction (impaired relaxation).   Right Ventricle: The right ventricular size is normal. No increase in  right ventricular wall thickness. Right ventricular systolic function is  normal.   Left Atrium: Left atrial size was normal in size.   Right Atrium: Right atrial size was normal in size.   Pericardium: There is no evidence of pericardial effusion. Presence of  epicardial fat layer.   Mitral Valve: The mitral valve is normal in structure. No evidence of  mitral valve regurgitation. No evidence of mitral valve stenosis. MV peak  gradient, 11.7 mmHg. The mean mitral valve gradient is 5.0 mmHg.   Tricuspid Valve: The tricuspid valve is normal in structure. Tricuspid  valve regurgitation is not demonstrated. No evidence of tricuspid  stenosis.   Aortic Valve: The aortic valve is normal in structure. Aortic valve  regurgitation is not visualized. No aortic stenosis is present.   Pulmonic Valve: The pulmonic valve was normal in structure. Pulmonic valve  regurgitation is not visualized. No evidence of pulmonic stenosis.   Aorta: There is borderline dilatation of the ascending aorta, measuring 36  mm.   Venous: The inferior vena cava is normal in size with greater than 50%   respiratory variability, suggesting right atrial pressure of 3 mmHg.   IAS/Shunts: No atrial level shunt detected by color flow Doppler.      LEFT VENTRICLE  PLAX 2D  LVIDd:         4.50 cm      Diastology  LVIDs:         3.60 cm      LV e' medial:    19.00 cm/s  LV PW:         1.07 cm      LV E/e' medial:  8.2  LV IVS:        1.04 cm      LV e' lateral:   17.90 cm/s  LVOT diam:     2.10 cm      LV E/e' lateral: 8.7  LV SV:         95  LV SV Index:   40  LVOT Area:     3.46 cm     LV Volumes (MOD)  LV vol d, MOD A2C: 104.2 ml  LV vol d, MOD A4C: 112.0 ml  LV vol s, MOD A2C: 45.8 ml  LV vol s, MOD A4C: 54.9 ml  LV SV MOD A2C:     58.3 ml  LV SV MOD A4C:     112.0 ml  LV SV MOD BP:      57.0 ml   RIGHT VENTRICLE            IVC  RV S prime:     9.36 cm/s  IVC diam: 2.60 cm  TAPSE (M-mode): 1.4 cm   LEFT ATRIUM             Index        RIGHT ATRIUM           Index  LA diam:        3.20 cm 1.37 cm/m   RA Area:     11.50 cm  LA Vol (A2C):   73.4 ml 31.33 ml/m  RA Volume:   24.70 ml  10.54 ml/m  LA Vol (A4C):   26.5 ml 11.31 ml/m  LA Biplane Vol: 45.0 ml 19.21 ml/m   AORTIC VALVE  LVOT Vmax:   141.00 cm/s  LVOT Vmean:  94.600 cm/s  LVOT VTI:    0.273 m     AORTA  Ao Root diam: 3.80 cm  Ao Asc diam:  3.60 cm   MITRAL VALVE  MV Area (PHT): 3.60 cm     SHUNTS  MV Area VTI:   3.25 cm     Systemic VTI:  0.27 m  MV Peak grad:  11.7 mmHg    Systemic Diam: 2.10 cm  MV Mean grad:  5.0 mmHg  MV Vmax:       1.71 m/s  MV Vmean:      101.0 cm/s  MV Decel Time: 211 msec  MV E velocity: 155.00 cm/s   Antimicrobials:  Anti-infectives (From admission, onward)    Start     Dose/Rate Route Frequency Ordered Stop   11/03/21 1400  ceFEPIme (MAXIPIME) 2 g in sodium chloride 0.9 % 100 mL IVPB  Status:  Discontinued        2 g 200 mL/hr over 30  Minutes Intravenous Every 8 hours 11/03/21 1048 11/03/21 1119   11/03/21 1200  ampicillin (OMNIPEN) 1 g in sodium chloride 0.9 % 100  mL IVPB  Status:  Discontinued        1 g 300 mL/hr over 20 Minutes Intravenous Every 6 hours 11/03/21 1041 11/03/21 1119   11/03/21 0600  metroNIDAZOLE (FLAGYL) IVPB 500 mg  Status:  Discontinued        500 mg 100 mL/hr over 60 Minutes Intravenous Every 12 hours 11/02/21 2031 11/02/21 2248   11/03/21 0200  ceFEPIme (MAXIPIME) 2 g in sodium chloride 0.9 % 100 mL IVPB  Status:  Discontinued        2 g 200 mL/hr over 30 Minutes Intravenous Every 8 hours 11/02/21 2101 11/03/21 0445   11/03/21 0100  acyclovir (ZOVIRAX) 775 mg in dextrose 5 % 100 mL IVPB  Status:  Discontinued        10 mg/kg  77.6 kg (Ideal) 115.5 mL/hr over 60 Minutes Intravenous Every 8 hours 11/02/21 2308 11/03/21 0445   11/03/21 0000  ampicillin (OMNIPEN) 2 g in sodium chloride 0.9 % 100 mL IVPB  Status:  Discontinued        2 g 300 mL/hr over 20 Minutes Intravenous Every 4 hours 11/02/21 2308 11/03/21 0445   11/02/21 2200  vancomycin (VANCOREADY) IVPB 1000 mg/200 mL        1,000 mg 200 mL/hr over 60 Minutes Intravenous Every 12 hours 11/02/21 2101     11/02/21 1700  ceFEPIme (MAXIPIME) 2 g in sodium chloride 0.9 % 100 mL IVPB        2 g 200 mL/hr over 30 Minutes Intravenous  Once 11/02/21 1646 11/02/21 1805   11/02/21 1700  metroNIDAZOLE (FLAGYL) IVPB 500 mg        500 mg 100 mL/hr over 60 Minutes Intravenous  Once 11/02/21 1646 11/02/21 2015   11/02/21 1700  vancomycin (VANCOCIN) IVPB 1000 mg/200 mL premix        1,000 mg 200 mL/hr over 60 Minutes Intravenous  Once 11/02/21 1646 11/02/21 2130        Subjective: Seen and examined at bedside and he had pulled out his IV after I left the room.  He remained a little agitated and continued to have neck pain.  No nausea or vomiting.  Did not complain of sore throat.  No chest pain or shortness of breath.  Did not feel well.  Wanted to urinate.  No other concerns or complaints this time  Objective: Vitals:   11/05/21 0017 11/05/21 0506 11/05/21 0757 11/05/21 1012   BP: (!) 165/99 (!) 157/87  (!) 158/96  Pulse: (!) 105 100    Resp: 20 17    Temp: 98.2 F (36.8 C) 100.1 F (37.8 C)    TempSrc: Oral Oral    SpO2: 93% 94% 93%   Weight:      Height:        Intake/Output Summary (Last 24 hours) at 11/05/2021 1810 Last data filed at 11/05/2021 0600 Gross per 24 hour  Intake 1310.67 ml  Output 650 ml  Net 660.67 ml   Filed Weights   11/02/21 0701  Weight: 113.4 kg   Examination: Physical Exam:  Constitutional: WN/WD obese Caucasian male currently in mild distress appears little agitated and slightly uncomfortable Eyes: Lids and conjunctivae normal, sclerae anicteric  ENMT: External Ears, Nose appear normal. Grossly normal hearing. Mucous membranes are moist.  Neck: Appears normal, supple, no cervical masses, normal ROM, no  appreciable thyromegaly no appreciable JVD Respiratory: Diminished to auscultation bilaterally, no wheezing, rales, rhonchi or crackles. Normal respiratory effort and patient is not tachypenic. No accessory muscle use.  Unlabored breathing Cardiovascular: RRR, no murmurs / rubs / gallops. S1 and S2 auscultated. No extremity edema.  Abdomen: Soft, non-tender, distended secondary body habitus.  Bowel sounds positive.  GU: Deferred. Musculoskeletal: No clubbing / cyanosis of digits/nails. No joint deformity upper and lower extremities. Skin: No rashes, lesions, ulcers on limited skin evaluation. No induration; Warm and dry.  Neurologic: CN 2-12 grossly intact with no focal deficits.  Little agitated Psychiatric: Impaired judgment and insight. Alert and oriented x 2.  Agitated mood and appropriate affect.   Data Reviewed: I have personally reviewed following labs and imaging studies  CBC: Recent Labs  Lab 11/02/21 0859 11/03/21 0440 11/04/21 0435 11/05/21 0441  WBC 19.4* 30.0* 24.0* 21.4*  NEUTROABS 17.4*  --   --   --   HGB 13.1 11.5* 11.2* 11.9*  HCT 37.4* 33.0* 32.2* 34.0*  MCV 89.3 88.7 89.4 87.4  PLT 292 252  231 096   Basic Metabolic Panel: Recent Labs  Lab 11/02/21 1028 11/03/21 0440 11/04/21 0435 11/05/21 0441  NA 132* 133* 131* 130*  K 3.7 4.3 3.9 4.5  CL 95* 97* 97* 95*  CO2 27 26 25 26   GLUCOSE 153* 185* 175* 137*  BUN 29* 19 26* 22  CREATININE 0.92 0.41* 0.76 0.72  CALCIUM 8.9 8.7* 8.7* 8.7*   GFR: Estimated Creatinine Clearance: 111.7 mL/min (by C-G formula based on SCr of 0.72 mg/dL). Liver Function Tests: Recent Labs  Lab 11/02/21 1028 11/03/21 0440  AST 20 19  ALT 34 30  ALKPHOS 66 68  BILITOT 0.8 1.1  PROT 7.7 6.7  ALBUMIN 3.2* 2.5*   No results for input(s): LIPASE, AMYLASE in the last 168 hours. No results for input(s): AMMONIA in the last 168 hours. Coagulation Profile: Recent Labs  Lab 11/02/21 1642  INR 1.3*   Cardiac Enzymes: No results for input(s): CKTOTAL, CKMB, CKMBINDEX, TROPONINI in the last 168 hours. BNP (last 3 results) No results for input(s): PROBNP in the last 8760 hours. HbA1C: No results for input(s): HGBA1C in the last 72 hours. CBG: Recent Labs  Lab 11/05/21 1626  GLUCAP 133*   Lipid Profile: No results for input(s): CHOL, HDL, LDLCALC, TRIG, CHOLHDL, LDLDIRECT in the last 72 hours. Thyroid Function Tests: No results for input(s): TSH, T4TOTAL, FREET4, T3FREE, THYROIDAB in the last 72 hours. Anemia Panel: No results for input(s): VITAMINB12, FOLATE, FERRITIN, TIBC, IRON, RETICCTPCT in the last 72 hours. Sepsis Labs: Recent Labs  Lab 11/02/21 1632 11/03/21 0440  PROCALCITON  --  2.69  LATICACIDVEN 1.2  --     Recent Results (from the past 240 hour(s))  Blood culture (routine x 2)     Status: Abnormal (Preliminary result)   Collection Time: 11/02/21 12:12 PM   Specimen: BLOOD  Result Value Ref Range Status   Specimen Description   Final    BLOOD RIGHT ANTECUBITAL Performed at [facility], [place] [address], [place], [place] [address]    Special Requests   Final    BOTTLES DRAWN AEROBIC AND  ANAEROBIC Blood Culture results may not be optimal due to an excessive volume of blood received in culture bottles Performed at [place] [address] [place], [place] [address]    Culture  Setup Time   Final    GRAM POSITIVE COCCI IN CLUSTERS IN  BOTH AEROBIC AND ANAEROBIC BOTTLES CRITICAL RESULT CALLED TO, READ BACK BY AND VERIFIED WITH: M LILLISTON,PHARMD@0432  11/03/21 [place]    Culture (A)  Final    METHICILLIN RESISTANT STAPHYLOCOCCUS AUREUS Sent to [facility] for further susceptibility testing. Performed at [facility], [place] [address], [place], [place] [address]    Report Status PENDING  Incomplete   Organism ID, Bacteria METHICILLIN RESISTANT STAPHYLOCOCCUS AUREUS  Final      Susceptibility   Methicillin resistant staphylococcus aureus - MIC*    CIPROFLOXACIN >=8 RESISTANT Resistant     ERYTHROMYCIN >=8 RESISTANT Resistant     GENTAMICIN <=0.5 SENSITIVE Sensitive     OXACILLIN >=4 RESISTANT Resistant     TETRACYCLINE <=1 SENSITIVE Sensitive     VANCOMYCIN 1 SENSITIVE Sensitive     TRIMETH/SULFA <=10 SENSITIVE Sensitive     CLINDAMYCIN <=0.25 SENSITIVE Sensitive     RIFAMPIN <=0.5 SENSITIVE Sensitive     Inducible Clindamycin NEGATIVE Sensitive     * METHICILLIN RESISTANT STAPHYLOCOCCUS AUREUS  Blood Culture ID Panel (Reflexed)     Status: Abnormal   Collection Time: 11/02/21 12:12 PM  Result Value Ref Range Status   Enterococcus faecalis NOT DETECTED NOT DETECTED Final   Enterococcus Faecium NOT DETECTED NOT DETECTED Final   Listeria monocytogenes NOT DETECTED NOT DETECTED Final   Staphylococcus species DETECTED (A) NOT DETECTED Final    Comment: CRITICAL RESULT CALLED TO, READ BACK BY AND VERIFIED WITH: M LILLISTON,PHARMD@0431  11/03/21 [place]    Staphylococcus aureus (BCID) DETECTED (A) NOT DETECTED Final    Comment: Methicillin (oxacillin)-resistant Staphylococcus aureus (MRSA). MRSA is predictably resistant to beta-lactam antibiotics (except  ceftaroline). Preferred therapy is vancomycin unless clinically contraindicated. Patient requires contact precautions if  hospitalized. CRITICAL RESULT CALLED TO, READ BACK BY AND VERIFIED WITH: M LILLISTON,PHARMD@0431  11/03/21 [place]    Staphylococcus epidermidis NOT DETECTED NOT DETECTED Final   Staphylococcus lugdunensis NOT DETECTED NOT DETECTED Final   Streptococcus species NOT DETECTED NOT DETECTED Final   Streptococcus agalactiae NOT DETECTED NOT DETECTED Final   Streptococcus pneumoniae NOT DETECTED NOT DETECTED Final   Streptococcus pyogenes NOT DETECTED NOT DETECTED Final   A.calcoaceticus-baumannii NOT DETECTED NOT DETECTED Final   Bacteroides fragilis NOT DETECTED NOT DETECTED Final   Enterobacterales NOT DETECTED NOT DETECTED Final   Enterobacter cloacae complex NOT DETECTED NOT DETECTED Final   Escherichia coli NOT DETECTED NOT DETECTED Final   Klebsiella aerogenes NOT DETECTED NOT DETECTED Final   Klebsiella oxytoca NOT DETECTED NOT DETECTED Final   Klebsiella pneumoniae NOT DETECTED NOT DETECTED Final   Proteus species NOT DETECTED NOT DETECTED Final   Salmonella species NOT DETECTED NOT DETECTED Final   Serratia marcescens NOT DETECTED NOT DETECTED Final   Haemophilus influenzae NOT DETECTED NOT DETECTED Final   Neisseria meningitidis NOT DETECTED NOT DETECTED Final   Pseudomonas aeruginosa NOT DETECTED NOT DETECTED Final   Stenotrophomonas maltophilia NOT DETECTED NOT DETECTED Final   Candida albicans NOT DETECTED NOT DETECTED Final   Candida auris NOT DETECTED NOT DETECTED Final   Candida glabrata NOT DETECTED NOT DETECTED Final   Candida krusei NOT DETECTED NOT DETECTED Final   Candida parapsilosis NOT DETECTED NOT DETECTED Final   Candida tropicalis NOT DETECTED NOT DETECTED Final   Cryptococcus neoformans/gattii NOT DETECTED NOT DETECTED Final   Meth resistant mecA/C and MREJ DETECTED (A) NOT DETECTED Final    Comment: CRITICAL RESULT CALLED TO, READ BACK BY AND  VERIFIED WITH: M LILLISTON,PHARMD@0431  11/03/21 [place] Performed at [facility],  [address] [place], [place] [address]   Blood culture (routine x 2)     Status: Abnormal   Collection Time: 11/02/21 12:17 PM   Specimen: BLOOD  Result Value Ref Range Status   Specimen Description   Final    BLOOD LEFT ANTECUBITAL Performed at [place] [address] [place], [place] [address]    Special Requests   Final    BOTTLES DRAWN AEROBIC AND ANAEROBIC Blood Culture adequate volume Performed at [place] [address], [place], [place] [address]    Culture  Setup Time   Final    GRAM POSITIVE COCCI IN CLUSTERS IN BOTH AEROBIC AND ANAEROBIC BOTTLES CRITICAL VALUE NOTED.  VALUE IS CONSISTENT WITH PREVIOUSLY REPORTED AND CALLED VALUE.    Culture (A)  Final    STAPHYLOCOCCUS AUREUS SUSCEPTIBILITIES PERFORMED ON PREVIOUS CULTURE WITHIN THE LAST 5 DAYS. Performed at [facility], [place] [address] [place], [place] [address]    Report Status 11/05/2021 FINAL  Final  Resp Panel by RT-PCR (Flu A&B, Covid) Nasopharyngeal Swab     Status: None   Collection Time: 11/02/21  4:16 PM   Specimen: Nasopharyngeal Swab; Nasopharyngeal(NP) swabs in vial transport medium  Result Value Ref Range Status   SARS Coronavirus 2 by RT PCR NEGATIVE NEGATIVE Final    Comment: (NOTE) SARS-CoV-2 target nucleic acids are NOT DETECTED.  The SARS-CoV-2 RNA is generally detectable in upper respiratory specimens during the acute phase of infection. The lowest concentration of SARS-CoV-2 viral copies this assay can detect is 138 copies/mL. A negative result does not preclude SARS-Cov-2 infection and should not be used as the sole basis for treatment or other patient management decisions. A negative result may occur with  improper specimen collection/handling, submission of specimen other than nasopharyngeal swab, presence of viral mutation(s) within the areas  targeted by this assay, and inadequate number of viral copies(<138 copies/mL). A negative result must be combined with clinical observations, patient history, and epidemiological information. The expected result is Negative.  Fact Sheet for Patients:  EntrepreneurPulse.com.au  Fact Sheet for Healthcare Providers:  IncredibleEmployment.be  This test is no t yet approved or cleared by the [place] FDA and  has been authorized for detection and/or diagnosis of SARS-CoV-2 by FDA under an Emergency Use Authorization (EUA). This EUA will remain  in effect (meaning this test can be used) for the duration of the COVID-19 declaration under Section 564(b)(1) of the Act, 21 U.S.C.section 360bbb-3(b)(1), unless the authorization is terminated  or revoked sooner.       Influenza A by PCR NEGATIVE NEGATIVE Final   Influenza B by PCR NEGATIVE NEGATIVE Final    Comment: (NOTE) The Xpert Xpress SARS-CoV-2/FLU/RSV plus assay is intended as an aid in the diagnosis of influenza from Nasopharyngeal swab specimens and should not be used as a sole basis for treatment. Nasal washings and aspirates are unacceptable for Xpert Xpress SARS-CoV-2/FLU/RSV testing.  Fact Sheet for Patients: EntrepreneurPulse.com.au  Fact Sheet for Healthcare Providers: IncredibleEmployment.be  This test is not yet approved or cleared by the [place] FDA and has been authorized for detection and/or diagnosis of SARS-CoV-2 by FDA under an Emergency Use Authorization (EUA). This EUA will remain in effect (meaning this test can be used) for the duration of the COVID-19 declaration under Section 564(b)(1) of the Act, 21 U.S.C. section 360bbb-3(b)(1), unless the authorization is terminated or revoked.  Performed at [facility], [place] [address] [place],  [place] [address]   Urine Culture     Status: Abnormal   Collection  Time: 11/02/21  4:33 PM   Specimen: In/Out Cath Urine  Result Value Ref Range Status   Specimen Description   Final    IN/OUT CATH URINE Performed at [facility], [place] [address], [place], [place] [address]    Special Requests   Final    NONE Performed at [facility], [place] [address] [place], [place] [address]    Culture (A)  Final    >=100,000 COLONIES/mL METHICILLIN RESISTANT STAPHYLOCOCCUS AUREUS   Report Status 11/05/2021 FINAL  Final   Organism ID, Bacteria METHICILLIN RESISTANT STAPHYLOCOCCUS AUREUS (A)  Final      Susceptibility   Methicillin resistant staphylococcus aureus - MIC*    CIPROFLOXACIN >=8 RESISTANT Resistant     GENTAMICIN <=0.5 SENSITIVE Sensitive     NITROFURANTOIN <=16 SENSITIVE Sensitive     OXACILLIN >=4 RESISTANT Resistant     TETRACYCLINE <=1 SENSITIVE Sensitive     VANCOMYCIN 1 SENSITIVE Sensitive     TRIMETH/SULFA <=10 SENSITIVE Sensitive     CLINDAMYCIN <=0.25 SENSITIVE Sensitive     RIFAMPIN <=0.5 SENSITIVE Sensitive     Inducible Clindamycin NEGATIVE Sensitive     * >=100,000 COLONIES/mL METHICILLIN RESISTANT STAPHYLOCOCCUS AUREUS  Culture, blood (routine x 2)     Status: None (Preliminary result)   Collection Time: 11/04/21  5:19 AM   Specimen: BLOOD RIGHT HAND  Result Value Ref Range Status   Specimen Description   Final    BLOOD RIGHT HAND Performed at [facility], [address], [place], [place] [address]    Special Requests   Final    BOTTLES DRAWN AEROBIC ONLY Blood Culture adequate volume Performed at [place] [address] [place], [place] [address]    Culture   Final    NO GROWTH 1 DAY Performed at [facility], [place] [address], [place], [place] [address]    Report Status PENDING  Incomplete  Culture, blood (routine x 2)     Status: None (Preliminary result)   Collection Time: 11/04/21  5:19 AM   Specimen: BLOOD LEFT HAND  Result Value Ref Range Status    Specimen Description   Final    BLOOD LEFT HAND Performed at [facility], [place] [address], [place], [place] [address]    Special Requests   Final    BOTTLES DRAWN AEROBIC ONLY Blood Culture adequate volume Performed at [facility] [address], [place], [place] [address]    Culture   Final    NO GROWTH 1 DAY Performed at [facility], [place] [address] [place],  [address]    Report Status PENDING  Incomplete    RN Pressure Injury Documentation:     Estimated body mass index is 33.91 kg/m as calculated from the following:   Height as of this encounter: 6' (1.829 m).   Weight as of this encounter: 113.4 kg.  Malnutrition Type:   Malnutrition Characteristics:   Nutrition Interventions:     Radiology Studies: CT ABDOMEN PELVIS W CONTRAST  Result Date: 11/05/2021 CLINICAL DATA:  Acute abdominal pain. EXAM: CT ABDOMEN AND PELVIS WITH CONTRAST TECHNIQUE: Multidetector CT imaging of the abdomen and pelvis was performed using the standard protocol following bolus administration of intravenous contrast. CONTRAST:  41mL OMNIPAQUE IOHEXOL 350 MG/ML SOLN COMPARISON:  None. FINDINGS: Lower chest: Clear. Hepatobiliary: Gallbladder sludge is  present. No calculi are identified. Liver and bile ducts are within normal limits. Pancreas: Unremarkable. No pancreatic ductal dilatation or surrounding inflammatory changes. Spleen: Normal in size without focal abnormality. Adrenals/Urinary Tract: Adrenal glands are unremarkable. Kidneys are normal, without renal calculi, focal lesion, or hydronephrosis. Bladder is unremarkable. Stomach/Bowel: There is a small hiatal hernia. Stomach is otherwise within normal limits. Appendix appears normal. No evidence of bowel wall thickening, distention, or inflammatory changes. There is sigmoid and descending colon diverticulosis without evidence for acute diverticulitis. Vascular/Lymphatic: Aortic atherosclerosis. No enlarged  abdominal or pelvic lymph nodes. Reproductive: Prostate is unremarkable. Other: There is no ascites or free air. There is no significant abdominal wall hernia. There is mild presacral edema. Injection site noted in the subcutaneous tissues of the right anterior abdominal wall. Musculoskeletal: No acute fracture identified. There is disc space narrowing and endplate sclerosis this cystic change at L3-L4 and L4-L5. There is mild stranding and small lymph nodes in the anterior paravertebral soft tissues at this level. No fluid collections are identified. IMPRESSION: 1. Presumably degenerative endplate changes at K2-I0 and L4-L5. There are mild inflammatory changes and small lymph nodes in the anterior paravertebral soft tissues at this level. If there is high clinical concern for infection, recommend MRI. 2. Gallbladder sludge. 3. Colonic diverticulosis without evidence for acute diverticulitis. Electronically Signed   By: Ronney Asters M.D.   On: 11/05/2021 15:40    Scheduled Meds:  amLODipine  5 mg Oral Daily   atorvastatin  20 mg Oral Daily   enoxaparin (LOVENOX) injection  60 mg Subcutaneous Q24H   losartan  50 mg Oral Daily   mometasone-formoterol  2 puff Inhalation BID   sodium chloride flush  3 mL Intravenous Q12H   Continuous Infusions:  sodium chloride 10 mL/hr at 11/03/21 1032   lactated ringers 100 mL/hr at 11/05/21 0359   vancomycin 1,000 mg (11/05/21 1021)    LOS: 3 days    Kerney Elbe, DO [facility] PAGER is on [place]  If 7PM-7AM, please contact night-coverage [email or web address]

## 2021-11-06 DIAGNOSIS — R7881 Bacteremia: Secondary | ICD-10-CM | POA: Diagnosis not present

## 2021-11-06 DIAGNOSIS — A4102 Sepsis due to Methicillin resistant Staphylococcus aureus: Secondary | ICD-10-CM | POA: Diagnosis not present

## 2021-11-06 DIAGNOSIS — J390 Retropharyngeal and parapharyngeal abscess: Secondary | ICD-10-CM | POA: Diagnosis not present

## 2021-11-06 DIAGNOSIS — R1031 Right lower quadrant pain: Secondary | ICD-10-CM | POA: Diagnosis not present

## 2021-11-06 DIAGNOSIS — M4620 Osteomyelitis of vertebra, site unspecified: Secondary | ICD-10-CM | POA: Diagnosis not present

## 2021-11-06 DIAGNOSIS — B9562 Methicillin resistant Staphylococcus aureus infection as the cause of diseases classified elsewhere: Secondary | ICD-10-CM | POA: Diagnosis not present

## 2021-11-06 LAB — CBC WITH DIFFERENTIAL/PLATELET
Abs Immature Granulocytes: 0.37 10*3/uL — ABNORMAL HIGH (ref 0.00–0.07)
Basophils Absolute: 0 10*3/uL (ref 0.0–0.1)
Basophils Relative: 0 %
Eosinophils Absolute: 0 10*3/uL (ref 0.0–0.5)
Eosinophils Relative: 0 %
HCT: 36.8 % — ABNORMAL LOW (ref 39.0–52.0)
Hemoglobin: 12.7 g/dL — ABNORMAL LOW (ref 13.0–17.0)
Immature Granulocytes: 2 %
Lymphocytes Relative: 5 %
Lymphs Abs: 0.8 10*3/uL (ref 0.7–4.0)
MCH: 30.2 pg (ref 26.0–34.0)
MCHC: 34.5 g/dL (ref 30.0–36.0)
MCV: 87.4 fL (ref 80.0–100.0)
Monocytes Absolute: 1.4 10*3/uL — ABNORMAL HIGH (ref 0.1–1.0)
Monocytes Relative: 8 %
Neutro Abs: 14.3 10*3/uL — ABNORMAL HIGH (ref 1.7–7.7)
Neutrophils Relative %: 85 %
Platelets: 218 10*3/uL (ref 150–400)
RBC: 4.21 MIL/uL — ABNORMAL LOW (ref 4.22–5.81)
RDW: 14.7 % (ref 11.5–15.5)
WBC: 16.9 10*3/uL — ABNORMAL HIGH (ref 4.0–10.5)
nRBC: 0 % (ref 0.0–0.2)

## 2021-11-06 LAB — MISC LABCORP TEST (SEND OUT): Labcorp test code: 96388

## 2021-11-06 LAB — COMPREHENSIVE METABOLIC PANEL
ALT: 27 U/L (ref 0–44)
AST: 23 U/L (ref 15–41)
Albumin: 2.7 g/dL — ABNORMAL LOW (ref 3.5–5.0)
Alkaline Phosphatase: 68 U/L (ref 38–126)
Anion gap: 13 (ref 5–15)
BUN: 24 mg/dL — ABNORMAL HIGH (ref 8–23)
CO2: 24 mmol/L (ref 22–32)
Calcium: 9 mg/dL (ref 8.9–10.3)
Chloride: 94 mmol/L — ABNORMAL LOW (ref 98–111)
Creatinine, Ser: 0.76 mg/dL (ref 0.61–1.24)
GFR, Estimated: >60 mL/min (ref >60)
Glucose, Bld: 110 mg/dL — ABNORMAL HIGH (ref 70–99)
Potassium: 3.9 mmol/L (ref 3.5–5.1)
Sodium: 131 mmol/L — ABNORMAL LOW (ref 135–145)
Total Bilirubin: 2.4 mg/dL — ABNORMAL HIGH (ref 0.3–1.2)
Total Protein: 7.7 g/dL (ref 6.5–8.1)

## 2021-11-06 LAB — VANCOMYCIN, TROUGH: Vancomycin Tr: 8 ug/mL — ABNORMAL LOW (ref 15–20)

## 2021-11-06 LAB — PHOSPHORUS: Phosphorus: 3.4 mg/dL (ref 2.5–4.6)

## 2021-11-06 LAB — MAGNESIUM: Magnesium: 1.8 mg/dL (ref 1.7–2.4)

## 2021-11-06 LAB — VANCOMYCIN, PEAK: Vancomycin Pk: 19 ug/mL — ABNORMAL LOW (ref 30–40)

## 2021-11-06 MED ORDER — QUETIAPINE FUMARATE 25 MG PO TABS
25.0000 mg | ORAL_TABLET | Freq: Every day | ORAL | Status: DC
  Administered 2021-11-06 – 2021-11-24 (×19): 25 mg via ORAL
  Filled 2021-11-06 (×19): qty 1

## 2021-11-06 MED ORDER — VANCOMYCIN HCL 1500 MG/300ML IV SOLN
1500.0000 mg | Freq: Two times a day (BID) | INTRAVENOUS | Status: DC
  Administered 2021-11-06 – 2021-11-08 (×5): 1500 mg via INTRAVENOUS
  Filled 2021-11-06 (×6): qty 300

## 2021-11-06 MED ORDER — MAGNESIUM SULFATE 2 GM/50ML IV SOLN
2.0000 g | Freq: Once | INTRAVENOUS | Status: AC
  Administered 2021-11-06: 15:00:00 2 g via INTRAVENOUS
  Filled 2021-11-06: qty 50

## 2021-11-06 NOTE — Progress Notes (Signed)
Pharmacy Antibiotic Note  Charles Weiss. is a 70 y.o. male admitted on 11/02/2021 with MRSA bacteremia. Pharmacy has been consulted for vancomycin dosing.  11/06/21 WBC 16.9 trending down; creatinine 0.76 stable; afebrile. Peak 19; Trough 8; Ke 0.0961; Cmax 23.2; Cmin 8.1; AUC 346.2 New predicted parameters for vancomycin 1500 mg Q12H: AUC 518.5; Cmax 34; Cmin 12.4  Plan:  Change vancomycin dose to 1500 mg Q12H with target goal AUC 400-550. F/U daptomycin sens & ability to change to daptomycin Anticipate 8-wk course of abx F/U renal function, WBC, creatinine, BCx results  Height: 6' (182.9 cm) Weight: 113.4 kg (250 lb) IBW/kg (Calculated) : 77.6  No data recorded.  Recent Labs  Lab 11/02/21 0859 11/02/21 1028 11/02/21 1632 11/03/21 0440 11/04/21 0435 11/05/21 0441 11/06/21 0100 11/06/21 1000  WBC 19.4*  --   --  30.0* 24.0* 21.4* 16.9*  --   CREATININE  --  0.92  --  0.41* 0.76 0.72 0.76  --   LATICACIDVEN  --   --  1.2  --   --   --   --   --   VANCOTROUGH  --   --   --   --   --   --   --  8*  VANCOPEAK  --   --   --   --   --   --  19*  --     Estimated Creatinine Clearance: 111.7 mL/min (by C-G formula based on SCr of 0.76 mg/dL).    Allergies  Allergen Reactions   Elavil [Amitriptyline] Other (See Comments)   Tetracyclines & Related Other (See Comments)   Antimicrobials this admission: 11/13 Vanc >>  11/13 Cefepime >>11/14 11/13 Flagyl x1 in ED 11/14 Acyclovir >>11/14 11/14 Ampicillin >>11/14  Dose adjustments this admission: 11/17 VP 19, VT 8 on 1 gm q12> incr to 1500 q12  Microbiology results: 11/13 BCx: 4/4 GPCC (BCID+MRSA) 11/13 UCx: >100K MRSA F 11/13 Resp PCR: negative for COVID/Influenza 11/15 BCx2: (aerobic only each "set") ngtd 11/17 BCx2: sent  Thank you for allowing pharmacy to be a part of this patient's care.  Thomos Domine A Davyd Podgorski 11/06/2021 11:58 AM

## 2021-11-06 NOTE — Progress Notes (Signed)
Charles Weiss for Infectious Disease  Date of Admission:  11/02/2021      Abx: 11/13-c vanc                                                        Assessment: 70 yo male with asthma, gerd, htn/hlp, recent influenza pna admitted to ED 11/13 with neck pain found to have sepsis and mrsa bsi along with diffuse spinal osseous involvement and retropharyngeal space edema    Mri spine diffuse lumbar epidural enhancement without abscess. L4-5 OM changes. C5-6 prevertebral enhancement no osseous finding   No neurologic deficit. No ams or clinical concern of meningitis at this time   Doesn't appear to be a superimposed mrsa bacterial pna. Unclear source   11/13 bcx mrsa   No hardware/cardiac device  --------- 11/17 assessment Clinically stable overall Wbc continues to improve; no fever Abd/pelv ct with contrast no renal abscess or psoas muscle abscess mentioned  11/15 bcx repeat ngtd 11/13 ucx mrsa descending involvement from mrsa septicemia -- primary team getting abd/pelv ct for abd tenderness, will see if kidney is involved  11/14 mri brain difficult but no obvious abscess 11/14 tte technically difficult study, but no obvious vegetation.  Deferring tee unless recurrent bacteremia beyond 3 days    Plan: Continue vancomycin (dapto susceptibility pending) Repeat bcx once more today F/u repeat bcx result Discussed with primary care    Principal Problem:   Sepsis ([name]) Active Problems:   Hypertension   Hyperlipidemia   Asthma   Acute respiratory failure with hypoxia (HCC)   Vertebral osteomyelitis ([name])   MRSA bacteremia   Retropharyngeal abscess   Right lower quadrant abdominal pain   Allergies  Allergen Reactions   Elavil [Amitriptyline] Other (See Comments)   Tetracyclines & Related Other (See Comments)    Scheduled Meds:  albuterol  2.5 mg Nebulization TID   amLODipine  5 mg Oral Daily   atorvastatin  20 mg Oral Daily   enoxaparin (LOVENOX)  injection  60 mg Subcutaneous Q24H   losartan  50 mg Oral Daily   mometasone-formoterol  2 puff Inhalation BID   sodium chloride flush  3 mL Intravenous Q12H   Continuous Infusions:  sodium chloride 10 mL/hr at 11/03/21 1032   lactated ringers 75 mL/hr at 11/05/21 2007   magnesium sulfate bolus IVPB     vancomycin     PRN Meds:.sodium chloride, acetaminophen **OR** acetaminophen, albuterol, haloperidol lactate, LORazepam, LORazepam, morphine injection, naLOXone (NARCAN)  injection, ondansetron **OR** ondansetron (ZOFRAN) IV, oxyCODONE-acetaminophen, senna-docusate   SUBJECTIVE: Stable moderate headache/back pain Right sided abd pain No n/v/diarrhea No visual change  Intermittent confusion better controlled with pain meds  Review of Systems: ROS All other ROS was negative, except mentioned above     OBJECTIVE: Vitals:   11/06/21 0753 11/06/21 0755 11/06/21 1128 11/06/21 1332  BP:   (!) 153/82 (!) 160/94  Pulse:    (!) 104  Resp:      Temp:      TempSrc:      SpO2: 93% 91%    Weight:      Height:       Body mass index is 33.91 kg/m.  Physical Exam General/constitutional: no distress, pleasant, close eyes but open cooperatively when asked HEENT: Normocephalic, PER, Conj  Clear Lungs: clear to auscultation, normal respiratory effort Abd: Soft, right sided superficial tenderness mild; nondistended; no rebound Ext: no edema Skin: No Rash Neuro: nonfocal MSK: no peripheral joint swelling/tenderness/warmth  Central line presence: no  Lab Results Lab Results  Component Value Date   WBC 16.9 (H) 11/06/2021   HGB 12.7 (L) 11/06/2021   HCT 36.8 (L) 11/06/2021   MCV 87.4 11/06/2021   PLT 218 11/06/2021    Lab Results  Component Value Date   CREATININE 0.76 11/06/2021   BUN 24 (H) 11/06/2021   NA 131 (L) 11/06/2021   K 3.9 11/06/2021   CL 94 (L) 11/06/2021   CO2 24 11/06/2021    Lab Results  Component Value Date   ALT 27 11/06/2021   AST 23 11/06/2021    ALKPHOS 68 11/06/2021   BILITOT 2.4 (H) 11/06/2021      Microbiology: Recent Results (from the past 240 hour(s))  Blood culture (routine x 2)     Status: Abnormal (Preliminary result)   Collection Time: 11/02/21 12:12 PM   Specimen: BLOOD  Result Value Ref Range Status   Specimen Description   Final    BLOOD RIGHT ANTECUBITAL Performed at [facility], [place] [address] [place], [place] [address]    Special Requests   Final    BOTTLES DRAWN AEROBIC AND ANAEROBIC Blood Culture results may not be optimal due to an excessive volume of blood received in culture bottles Performed at [facility] [address], [place], [place] [address]    Culture  Setup Time   Final    GRAM POSITIVE COCCI IN CLUSTERS IN BOTH AEROBIC AND ANAEROBIC BOTTLES CRITICAL RESULT CALLED TO, READ BACK BY AND VERIFIED WITH: M LILLISTON,PHARMD@0432  11/03/21 [place]    Culture (A)  Final    METHICILLIN RESISTANT STAPHYLOCOCCUS AUREUS Sent to [place] for further susceptibility testing. Performed at [facility], [place] [address], [place], [place] [address]    Report Status PENDING  Incomplete   Organism ID, Bacteria METHICILLIN RESISTANT STAPHYLOCOCCUS AUREUS  Final      Susceptibility   Methicillin resistant staphylococcus aureus - MIC*    CIPROFLOXACIN >=8 RESISTANT Resistant     ERYTHROMYCIN >=8 RESISTANT Resistant     GENTAMICIN <=0.5 SENSITIVE Sensitive     OXACILLIN >=4 RESISTANT Resistant     TETRACYCLINE <=1 SENSITIVE Sensitive     VANCOMYCIN 1 SENSITIVE Sensitive     TRIMETH/SULFA <=10 SENSITIVE Sensitive     CLINDAMYCIN <=0.25 SENSITIVE Sensitive     RIFAMPIN <=0.5 SENSITIVE Sensitive     Inducible Clindamycin NEGATIVE Sensitive     * METHICILLIN RESISTANT STAPHYLOCOCCUS AUREUS  Blood Culture ID Panel (Reflexed)     Status: Abnormal   Collection Time: 11/02/21 12:12 PM  Result Value Ref Range Status   Enterococcus faecalis NOT DETECTED NOT DETECTED Final    Enterococcus Faecium NOT DETECTED NOT DETECTED Final   Listeria monocytogenes NOT DETECTED NOT DETECTED Final   Staphylococcus species DETECTED (A) NOT DETECTED Final    Comment: CRITICAL RESULT CALLED TO, READ BACK BY AND VERIFIED WITH: M LILLISTON,PHARMD@0431  11/03/21 [place]    Staphylococcus aureus (BCID) DETECTED (A) NOT DETECTED Final    Comment: Methicillin (oxacillin)-resistant Staphylococcus aureus (MRSA). MRSA is predictably resistant to beta-lactam antibiotics (except ceftaroline). Preferred therapy is vancomycin unless clinically contraindicated. Patient requires contact precautions if  hospitalized. CRITICAL RESULT CALLED TO, READ BACK BY AND VERIFIED WITH: M LILLISTON,PHARMD@0431  11/03/21 [place]    Staphylococcus epidermidis NOT DETECTED  NOT DETECTED Final   Staphylococcus lugdunensis NOT DETECTED NOT DETECTED Final   Streptococcus species NOT DETECTED NOT DETECTED Final   Streptococcus agalactiae NOT DETECTED NOT DETECTED Final   Streptococcus pneumoniae NOT DETECTED NOT DETECTED Final   Streptococcus pyogenes NOT DETECTED NOT DETECTED Final   A.calcoaceticus-baumannii NOT DETECTED NOT DETECTED Final   Bacteroides fragilis NOT DETECTED NOT DETECTED Final   Enterobacterales NOT DETECTED NOT DETECTED Final   Enterobacter cloacae complex NOT DETECTED NOT DETECTED Final   Escherichia coli NOT DETECTED NOT DETECTED Final   Klebsiella aerogenes NOT DETECTED NOT DETECTED Final   Klebsiella oxytoca NOT DETECTED NOT DETECTED Final   Klebsiella pneumoniae NOT DETECTED NOT DETECTED Final   Proteus species NOT DETECTED NOT DETECTED Final   Salmonella species NOT DETECTED NOT DETECTED Final   Serratia marcescens NOT DETECTED NOT DETECTED Final   Haemophilus influenzae NOT DETECTED NOT DETECTED Final   Neisseria meningitidis NOT DETECTED NOT DETECTED Final   Pseudomonas aeruginosa NOT DETECTED NOT DETECTED Final   Stenotrophomonas maltophilia NOT DETECTED NOT DETECTED Final   Candida  albicans NOT DETECTED NOT DETECTED Final   Candida auris NOT DETECTED NOT DETECTED Final   Candida glabrata NOT DETECTED NOT DETECTED Final   Candida krusei NOT DETECTED NOT DETECTED Final   Candida parapsilosis NOT DETECTED NOT DETECTED Final   Candida tropicalis NOT DETECTED NOT DETECTED Final   Cryptococcus neoformans/gattii NOT DETECTED NOT DETECTED Final   Meth resistant mecA/C and MREJ DETECTED (A) NOT DETECTED Final    Comment: CRITICAL RESULT CALLED TO, READ BACK BY AND VERIFIED WITH: M LILLISTON,PHARMD@0431  11/03/21 [place] Performed at [facility], [address], [place], [place] [address]   Blood culture (routine x 2)     Status: Abnormal   Collection Time: 11/02/21 12:17 PM   Specimen: BLOOD  Result Value Ref Range Status   Specimen Description   Final    BLOOD LEFT ANTECUBITAL Performed at [place] [address] [place], [place] [address]    Special Requests   Final    BOTTLES DRAWN AEROBIC AND ANAEROBIC Blood Culture adequate volume Performed at [place] [facility] [address], [place], [place] [address]    Culture  Setup Time   Final    GRAM POSITIVE COCCI IN CLUSTERS IN BOTH AEROBIC AND ANAEROBIC BOTTLES CRITICAL VALUE NOTED.  VALUE IS CONSISTENT WITH PREVIOUSLY REPORTED AND CALLED VALUE.    Culture (A)  Final    STAPHYLOCOCCUS AUREUS SUSCEPTIBILITIES PERFORMED ON PREVIOUS CULTURE WITHIN THE LAST 5 DAYS. Performed at [facility], [place] [address] [place], [place] [address]    Report Status 11/05/2021 FINAL  Final  Resp Panel by RT-PCR (Flu A&B, Covid) Nasopharyngeal Swab     Status: None   Collection Time: 11/02/21  4:16 PM   Specimen: Nasopharyngeal Swab; Nasopharyngeal(NP) swabs in vial transport medium  Result Value Ref Range Status   SARS Coronavirus 2 by RT PCR NEGATIVE NEGATIVE Final    Comment: (NOTE) SARS-CoV-2 target nucleic acids are NOT DETECTED.  The SARS-CoV-2 RNA is generally detectable in  upper respiratory specimens during the acute phase of infection. The lowest concentration of SARS-CoV-2 viral copies this assay can detect is 138 copies/mL. A negative result does not preclude SARS-Cov-2 infection and should not be used as the sole basis for treatment or other patient management decisions. A negative result may occur with  improper specimen collection/handling, submission of specimen other than nasopharyngeal swab, presence of viral mutation(s)  within the areas targeted by this assay, and inadequate number of viral copies(<138 copies/mL). A negative result must be combined with clinical observations, patient history, and epidemiological information. The expected result is Negative.  Fact Sheet for Patients:  EntrepreneurPulse.com.au  Fact Sheet for Healthcare Providers:  IncredibleEmployment.be  This test is no t yet approved or cleared by the [place] FDA and  has been authorized for detection and/or diagnosis of SARS-CoV-2 by FDA under an Emergency Use Authorization (EUA). This EUA will remain  in effect (meaning this test can be used) for the duration of the COVID-19 declaration under Section 564(b)(1) of the Act, 21 U.S.C.section 360bbb-3(b)(1), unless the authorization is terminated  or revoked sooner.       Influenza A by PCR NEGATIVE NEGATIVE Final   Influenza B by PCR NEGATIVE NEGATIVE Final    Comment: (NOTE) The Xpert Xpress SARS-CoV-2/FLU/RSV plus assay is intended as an aid in the diagnosis of influenza from Nasopharyngeal swab specimens and should not be used as a sole basis for treatment. Nasal washings and aspirates are unacceptable for Xpert Xpress SARS-CoV-2/FLU/RSV testing.  Fact Sheet for Patients: EntrepreneurPulse.com.au  Fact Sheet for Healthcare Providers: IncredibleEmployment.be  This test is not yet approved or cleared by the [place] FDA and has been  authorized for detection and/or diagnosis of SARS-CoV-2 by FDA under an Emergency Use Authorization (EUA). This EUA will remain in effect (meaning this test can be used) for the duration of the COVID-19 declaration under Section 564(b)(1) of the Act, 21 U.S.C. section 360bbb-3(b)(1), unless the authorization is terminated or revoked.  Performed at [facility], [place] [address] [place], [place] [address]   Urine Culture     Status: Abnormal   Collection Time: 11/02/21  4:33 PM   Specimen: In/Out Cath Urine  Result Value Ref Range Status   Specimen Description   Final    IN/OUT CATH URINE Performed at [facility] [address] [place], [place] [address]    Special Requests   Final    NONE Performed at [facility], [place]  [address], [place], [place] [address]    Culture (A)  Final    >=100,000 COLONIES/mL METHICILLIN RESISTANT STAPHYLOCOCCUS AUREUS   Report Status 11/05/2021 FINAL  Final   Organism ID, Bacteria METHICILLIN RESISTANT STAPHYLOCOCCUS AUREUS (A)  Final      Susceptibility   Methicillin resistant staphylococcus aureus - MIC*    CIPROFLOXACIN >=8 RESISTANT Resistant     GENTAMICIN <=0.5 SENSITIVE Sensitive     NITROFURANTOIN <=16 SENSITIVE Sensitive     OXACILLIN >=4 RESISTANT Resistant     TETRACYCLINE <=1 SENSITIVE Sensitive     VANCOMYCIN 1 SENSITIVE Sensitive     TRIMETH/SULFA <=10 SENSITIVE Sensitive     CLINDAMYCIN <=0.25 SENSITIVE Sensitive     RIFAMPIN <=0.5 SENSITIVE Sensitive     Inducible Clindamycin NEGATIVE Sensitive     * >=100,000 COLONIES/mL METHICILLIN RESISTANT STAPHYLOCOCCUS AUREUS  Culture, blood (routine x 2)     Status: None (Preliminary result)   Collection Time: 11/04/21  5:19 AM   Specimen: BLOOD RIGHT HAND  Result Value Ref Range Status   Specimen Description   Final    BLOOD RIGHT HAND Performed at [facility], [address], [place], [place] [address]    Special  Requests   Final    BOTTLES DRAWN AEROBIC ONLY Blood Culture adequate volume Performed at [facility] [address] [place], [place] [address]  Culture   Final    NO GROWTH 1 DAY Performed at [facility], [place] [address], [place], [place] [address]    Report Status PENDING  Incomplete  Culture, blood (routine x 2)     Status: None (Preliminary result)   Collection Time: 11/04/21  5:19 AM   Specimen: BLOOD LEFT HAND  Result Value Ref Range Status   Specimen Description   Final    BLOOD LEFT HAND Performed at [facility], [place] [address] [place], [place] [address]    Special Requests   Final    BOTTLES DRAWN AEROBIC ONLY Blood Culture adequate volume Performed at [place] [address] [place], [place] [address]    Culture   Final    NO GROWTH 1 DAY Performed at [facility], [place] [address], [place], [place] [address]    Report Status PENDING  Incomplete     Serology:   Imaging: If present, new imagings (plain films, ct scans, and mri) have been personally visualized and interpreted; radiology reports have been reviewed. Decision making incorporated into the Impression / Recommendations.  11/13 cxr Bilateral patchy airspace disease is likely slightly decreased compared with prior exam, although evaluation is somewhat limited due to differences in technique. Recommend follow-up PA and lateral chest x-ray in 6-8 weeks to ensure complete resolution.   11/13 ct angio head/neck 1. Retropharyngeal edema without visible pharyngeal or spinal source, suggest enhanced cervical MRI and inflammatory labs. 2. Mild for age atherosclerosis.   11/14 mri entire spine 1. Fluid and enhancement within the disc space at L4-5 with adjacent edema and enhancement of the endplates. 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 2. Extensive epidural enhancement in the lower lumbar spine L2-3 through the sacrum  compatible with infection. Recommend lumbar puncture further evaluation. 3. Prevertebral edema with peripheral postcontrast enhancement extending from the skull base through C5-6. Focal source for infection in the upper cervical spine not identified. 4. The study is moderately degraded by patient motion. 5. 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 6. Diffuse epidural enhancement at L2-3, L3-4, L4-5, and L5-S1 without discrete epidural abscess. 7. Moderate central canal and bilateral foraminal stenosis at L3-4. 8. Mild foraminal narrowing bilaterally at T7-8, T8-9 and T9-10 secondary to facet spurring.    11/14 mri brain 1. Severely motion degraded examination without evidence of acute intracranial abnormality. 2. Mild-to-moderate chronic small vessel ischemic disease.  11/15 tte  1. Left ventricular ejection fraction, by estimation, is 55 to 60%. The  left ventricle has normal function. The left ventricle has no regional  wall motion abnormalities. There is mild concentric left ventricular  hypertrophy. Left ventricular diastolic  parameters are consistent with Grade I diastolic dysfunction (impaired  relaxation).   2. Right ventricular systolic function is normal. The right ventricular  size is normal.   3. The mitral valve is normal in structure. No evidence of mitral valve  regurgitation. No evidence of mitral stenosis.   4. The aortic valve is normal in structure. Aortic valve regurgitation is  not visualized. No aortic stenosis is present.   5. There is borderline dilatation of the ascending aorta, measuring 36  mm.   6. The inferior vena cava is normal in size with greater than 50%  respiratory variability, suggesting right atrial pressure of 3 mmHg.   11/16 abd pelv ct with contrast 1. Presumably degenerative endplate changes at E4-M3 and L4-L5. There are mild inflammatory changes and small lymph nodes in  the anterior paravertebral soft tissues at this  level. If there is high clinical concern for infection, recommend MRI. 2. Gallbladder sludge. 3. Colonic diverticulosis without evidence for acute diverticulitis.  Jabier Mutton, [place] for Infectious [place] [phone] pager    11/06/2021, 2:17 PM

## 2021-11-06 NOTE — Care Management Important Message (Signed)
Important Message  Patient Details IM Letter placed in Patients room. Name: [facility] [name]. MRN: [id number] Date of Birth: Mar 21, 1951   Medicare Important Message Given:  Yes     Kerin Salen 11/06/2021, 11:35 AM

## 2021-11-06 NOTE — Progress Notes (Signed)
PROGRESS NOTE    Charles Weiss.  NID:[id number] DOB: Aug 28, 1951 DOA: 11/02/2021 PCP: System, Provider Not In  Brief Narrative:  The patient is a 70 year old Caucasian male with a past medical history significant for but limited to asthma, hypertension as well as other comorbidities with recent admission for influenza pneumonia and respiratory failure who came back to the emergency room with neck pain and headache.  He is noted to have a high fever and upon further work-up he is found to have evidence of discitis in the lumbar vertebra.  Blood cultures were positive for MRSA.  He was started on vancomycin and ID was consulted.  He is undergoing further work-up and his mental status is a little off and he is agitated.  MRI was unrevealing.  ID recommending repeating MRI later this week if mentation has not improved.  Continues to be extremely confused and is in soft wrist restraints given that he is a danger to himself given his mentation.  Continues to have significant right-sided abdominal pain but CT scan of the abdomen pelvis done as below.  Assessment & Plan:   Principal Problem:   Sepsis ([facility]) Active Problems:   Hypertension   Hyperlipidemia   Asthma   Acute respiratory failure with hypoxia (HCC)   Vertebral osteomyelitis (HCC)   MRSA bacteremia   Retropharyngeal abscess   Right lower quadrant abdominal pain  Sepsis secondary to MRSA bacteremia -Noted to be febrile with T-max of 103.4, tachycardic heart rate in the 110s, tachypneic source of infection is discitis.  Evidence of endorgan damage includes encephalopathy related to sepsis. -Infectious disease following and recommending continuing daily evaluation for CNS symptoms and repeat MRI at the end of the week if persistent or worsening headache or other CNS symptoms -Currently on vancomycin and ID is following and they are recommending continue vancomycin for now -Repeat blood culture sent the day before  yesterday -Acute urinary 100 MLS per hour but will reduce to 75 MLS per hour and will continue for now -Will eventually need PICC line once cultures have cleared -ID is recommending obtaining repeat blood cultures today -2D echocardiogram performed that was unremarkable -PT OT recommending SNF   Discitis/vertebral osteomyelitis -Noted to have significant discitis and lumbar vertebral/sacrum -Also noted to have significant epidural enhancement -Continue on IV antibiotics total of 8 weeks -Discussed with infectious disease/neurology was not felt that lumbar puncture would change management at this point -Checking ECHOCardiogram as above -WBC has gone from 24.0 -> 21.4 -> 16.9 -Repeat blood cultures x2 from 01/04/2021 showed no growth to date at 2 days and they are being repeated again on 11/06/2021 and still pending   Abdominal pain -Noted to have diffuse abdominal tenderness -Unclear if this is contributing to underlying sepsis -Check CT abdomen and showed "Presumably degenerative endplate changes at R4-E3 and L4-L5. There are mild inflammatory changes and small lymph nodes in the anterior paravertebral soft tissues at this level. If there is high clinical concern for infection, recommend MRI.  Gallbladder sludge. Colonic diverticulosis without evidence for acute diverticulitis." -ID is continuing IV vancomycin and recommending follow-up with Romycin sensibilities   Acute Encephalopathy with Agitation -Related to sepsis and likely infection -He is intermittently agitated and confused and combative -C/w Lorazepam 1 mg po/IV q6hprn and I I will will discontinue and start the patient on Haldol 2 mg every 6 as needed -Place on Delirium Precautions -MRI Brain done and showed "Severely motion degraded examination without evidence of acute intracranial abnormality. Mild-to-moderate chronic small  vessel ischemic disease."  -Continue to monitor -Could be related to pain as his mentation improved  with better pain control -Patient is currently in wrist restraints and will -ID recommending repeating MRI at the end of this week if he continues with persistent worsening headache or other CNS symptoms -We will place on delirium precautions   Hypertension -Currently on amlodipine and losartan and being continued on both of those with amlodipine 5 mg p.o. daily and losartan 50 mg p.o. daily -Holding hydrochlorothiazide since it is likely following completed -Blood pressure likely further elevated due to pain -Continue to Monitor BP per Protocol -Last BP reading was 160/94   Hyperlipidemia -Continued on atorvastatin 20 g p.o. daily   Sore throat -Noted on CT scan to have retropharyngeal erythema.  No comment of obvious abscess no CT -EDP discussed with ENT who recommended conservative management with IV antibiotics -Was also recommended that he receive Decadron 8 mg every 8 hours x 3 doses; has completed his Decadron doses -If symptoms worsen over the next 24 to 48 hours, recommendations are for reimaging but did not complain of Sore Throat today  -Continue to Monitor closely    Asthma -Currently no wheezing -Continue on bronchodilators, Dulera -Continue to Monitor Respiratory Status carefully -SpO2: 92 % O2 Flow Rate (L/min): 2 L/min  Hyponatremia -Patient's sodium went from 131 -> 130 -> 131 -Continued with IV fluid hydration as above -Repeat CMP in the a.m.  Normocytic Anemia -Patient's hemoglobin/hematocrit went from 11.2/32.2 -> 11.9/34.0 -> 12.7/36.8 -Check anemia panel in the a.m. -Continue to monitor for signs and symptoms of bleeding; currently no overt bleeding noted -Repeat CBC in a.m.  Obesity -Complicates overall prognosis and care -Estimated body mass index is 33.91 kg/m as calculated from the following:   Height as of this encounter: 6' (1.829 m).   Weight as of this encounter: 113.4 kg. -Weight loss and Dietary Counseling given   DVT prophylaxis:  Enoxaparin Code Status: DO NOT RESUSCITATE Family Communication: No family currently at bedside but I spoke with his Sister over the phone Disposition Plan: Pending further clinical improvement and clearance by ID  Status is: Inpatient  Remains inpatient appropriate because: Remains somewhat confused and encephalopathic we will need ID clearance and antibiotic recommendations prior to safe discharge disposition   Consultants:  Infectious diseases  Procedures:  ECHOCARDIOGRAM IMPRESSIONS     1. Left ventricular ejection fraction, by estimation, is 55 to 60%. The  left ventricle has normal function. The left ventricle has no regional  wall motion abnormalities. There is mild concentric left ventricular  hypertrophy. Left ventricular diastolic  parameters are consistent with Grade I diastolic dysfunction (impaired  relaxation).   2. Right ventricular systolic function is normal. The right ventricular  size is normal.   3. The mitral valve is normal in structure. No evidence of mitral valve  regurgitation. No evidence of mitral stenosis.   4. The aortic valve is normal in structure. Aortic valve regurgitation is  not visualized. No aortic stenosis is present.   5. There is borderline dilatation of the ascending aorta, measuring 36  mm.   6. The inferior vena cava is normal in size with greater than 50%  respiratory variability, suggesting right atrial pressure of 3 mmHg.   Comparison(s): No prior Echocardiogram.   FINDINGS   Left Ventricle: Left ventricular ejection fraction, by estimation, is 55  to 60%. The left ventricle has normal function. The left ventricle has no  regional wall motion abnormalities. Definity contrast agent  was given IV  to delineate the left ventricular   endocardial borders. The left ventricular internal cavity size was normal  in size. There is mild concentric left ventricular hypertrophy. Left  ventricular diastolic parameters are consistent with  Grade I diastolic  dysfunction (impaired relaxation).   Right Ventricle: The right ventricular size is normal. No increase in  right ventricular wall thickness. Right ventricular systolic function is  normal.   Left Atrium: Left atrial size was normal in size.   Right Atrium: Right atrial size was normal in size.   Pericardium: There is no evidence of pericardial effusion. Presence of  epicardial fat layer.   Mitral Valve: The mitral valve is normal in structure. No evidence of  mitral valve regurgitation. No evidence of mitral valve stenosis. MV peak  gradient, 11.7 mmHg. The mean mitral valve gradient is 5.0 mmHg.   Tricuspid Valve: The tricuspid valve is normal in structure. Tricuspid  valve regurgitation is not demonstrated. No evidence of tricuspid  stenosis.   Aortic Valve: The aortic valve is normal in structure. Aortic valve  regurgitation is not visualized. No aortic stenosis is present.   Pulmonic Valve: The pulmonic valve was normal in structure. Pulmonic valve  regurgitation is not visualized. No evidence of pulmonic stenosis.   Aorta: There is borderline dilatation of the ascending aorta, measuring 36  mm.   Venous: The inferior vena cava is normal in size with greater than 50%  respiratory variability, suggesting right atrial pressure of 3 mmHg.   IAS/Shunts: No atrial level shunt detected by color flow Doppler.      LEFT VENTRICLE  PLAX 2D  LVIDd:         4.50 cm      Diastology  LVIDs:         3.60 cm      LV e' medial:    19.00 cm/s  LV PW:         1.07 cm      LV E/e' medial:  8.2  LV IVS:        1.04 cm      LV e' lateral:   17.90 cm/s  LVOT diam:     2.10 cm      LV E/e' lateral: 8.7  LV SV:         95  LV SV Index:   40  LVOT Area:     3.46 cm     LV Volumes (MOD)  LV vol d, MOD A2C: 104.2 ml  LV vol d, MOD A4C: 112.0 ml  LV vol s, MOD A2C: 45.8 ml  LV vol s, MOD A4C: 54.9 ml  LV SV MOD A2C:     58.3 ml  LV SV MOD A4C:     112.0 ml  LV SV  MOD BP:      57.0 ml   RIGHT VENTRICLE            IVC  RV S prime:     9.36 cm/s  IVC diam: 2.60 cm  TAPSE (M-mode): 1.4 cm   LEFT ATRIUM             Index        RIGHT ATRIUM           Index  LA diam:        3.20 cm 1.37 cm/m   RA Area:     11.50 cm  LA Vol (A2C):   73.4 ml 31.33 ml/m  RA Volume:  24.70 ml  10.54 ml/m  LA Vol (A4C):   26.5 ml 11.31 ml/m  LA Biplane Vol: 45.0 ml 19.21 ml/m   AORTIC VALVE  LVOT Vmax:   141.00 cm/s  LVOT Vmean:  94.600 cm/s  LVOT VTI:    0.273 m     AORTA  Ao Root diam: 3.80 cm  Ao Asc diam:  3.60 cm   MITRAL VALVE  MV Area (PHT): 3.60 cm     SHUNTS  MV Area VTI:   3.25 cm     Systemic VTI:  0.27 m  MV Peak grad:  11.7 mmHg    Systemic Diam: 2.10 cm  MV Mean grad:  5.0 mmHg  MV Vmax:       1.71 m/s  MV Vmean:      101.0 cm/s  MV Decel Time: 211 msec  MV E velocity: 155.00 cm/s   Antimicrobials:  Anti-infectives (From admission, onward)    Start     Dose/Rate Route Frequency Ordered Stop   11/06/21 2200  vancomycin (VANCOREADY) IVPB 1500 mg/300 mL        1,500 mg 150 mL/hr over 120 Minutes Intravenous Every 12 hours 11/06/21 1223     11/03/21 1400  ceFEPIme (MAXIPIME) 2 g in sodium chloride 0.9 % 100 mL IVPB  Status:  Discontinued        2 g 200 mL/hr over 30 Minutes Intravenous Every 8 hours 11/03/21 1048 11/03/21 1119   11/03/21 1200  ampicillin (OMNIPEN) 1 g in sodium chloride 0.9 % 100 mL IVPB  Status:  Discontinued        1 g 300 mL/hr over 20 Minutes Intravenous Every 6 hours 11/03/21 1041 11/03/21 1119   11/03/21 0600  metroNIDAZOLE (FLAGYL) IVPB 500 mg  Status:  Discontinued        500 mg 100 mL/hr over 60 Minutes Intravenous Every 12 hours 11/02/21 2031 11/02/21 2248   11/03/21 0200  ceFEPIme (MAXIPIME) 2 g in sodium chloride 0.9 % 100 mL IVPB  Status:  Discontinued        2 g 200 mL/hr over 30 Minutes Intravenous Every 8 hours 11/02/21 2101 11/03/21 0445   11/03/21 0100  acyclovir (ZOVIRAX) 775 mg in dextrose 5 % 100  mL IVPB  Status:  Discontinued        10 mg/kg  77.6 kg (Ideal) 115.5 mL/hr over 60 Minutes Intravenous Every 8 hours 11/02/21 2308 11/03/21 0445   11/03/21 0000  ampicillin (OMNIPEN) 2 g in sodium chloride 0.9 % 100 mL IVPB  Status:  Discontinued        2 g 300 mL/hr over 20 Minutes Intravenous Every 4 hours 11/02/21 2308 11/03/21 0445   11/02/21 2200  vancomycin (VANCOREADY) IVPB 1000 mg/200 mL  Status:  Discontinued        1,000 mg 200 mL/hr over 60 Minutes Intravenous Every 12 hours 11/02/21 2101 11/06/21 1223   11/02/21 1700  ceFEPIme (MAXIPIME) 2 g in sodium chloride 0.9 % 100 mL IVPB        2 g 200 mL/hr over 30 Minutes Intravenous  Once 11/02/21 1646 11/02/21 1805   11/02/21 1700  metroNIDAZOLE (FLAGYL) IVPB 500 mg        500 mg 100 mL/hr over 60 Minutes Intravenous  Once 11/02/21 1646 11/02/21 2015   11/02/21 1700  vancomycin (VANCOCIN) IVPB 1000 mg/200 mL premix        1,000 mg 200 mL/hr over 60 Minutes Intravenous  Once 11/02/21 1646 11/02/21 2130  Subjective: Seen and examined at bedside and he was significantly confused and agitated this morning.  Remains in soft wrist restraints.  Continues to have some abdominal pain continues to complain of some neck pain.  Nursing states that it is a little bit more somnolent but is easily arousable and a little agitated when I woke him up.  Continues to be confused.  No other concerns or complaints at this time and he remains on vancomycin.  Objective: Vitals:   11/06/21 0755 11/06/21 1128 11/06/21 1332 11/06/21 1504  BP:  (!) 153/82 (!) 160/94   Pulse:   (!) 104   Resp:      Temp:      TempSrc:      SpO2: 91%   92%  Weight:      Height:        Intake/Output Summary (Last 24 hours) at 11/06/2021 1551 Last data filed at 11/06/2021 0600 Gross per 24 hour  Intake 855.94 ml  Output --  Net 855.94 ml    Filed Weights   11/02/21 0701  Weight: 113.4 kg   Examination: Physical Exam:  Constitutional: WN/WD obese  Caucasian male who is significantly confused and altered and a little agitated.  He is only alert and oriented x1 Eyes: Lids and conjunctivae normal, sclerae anicteric  ENMT: External Ears, Nose appear normal. Grossly normal hearing.  Neck: Appears normal, supple, no cervical masses, normal ROM, no appreciable thyromegaly; no appreciable JVD Respiratory: Diminished to auscultation bilaterally, no wheezing, rales, rhonchi or crackles. Normal respiratory effort and patient is not tachypenic. No accessory muscle use.  Unlabored breathing Cardiovascular: RRR, no murmurs / rubs / gallops. S1 and S2 auscultated. No extremity edema.  Abdomen: Soft, tender on the right side, distended secondary body habitus. Bowel sounds positive.  GU: Deferred. Musculoskeletal: No clubbing / cyanosis of digits/nails. No joint deformity upper and lower extremities.  Skin: No rashes, lesions, ulcers on limited skin evaluation but he has a small skin tear on his chest from scratching himself. No induration; Warm and dry.  Neurologic: CN 2-12 grossly intact with no focal deficits. Romberg sign and cerebellar reflexes not assessed.  Psychiatric: Impaired judgment and insight. Alert and oriented x 1.  A little agitated mood and appropriate affect.   Data Reviewed: I have personally reviewed following labs and imaging studies  CBC: Recent Labs  Lab 11/02/21 0859 11/03/21 0440 11/04/21 0435 11/05/21 0441 11/06/21 0100  WBC 19.4* 30.0* 24.0* 21.4* 16.9*  NEUTROABS 17.4*  --   --   --  14.3*  HGB 13.1 11.5* 11.2* 11.9* 12.7*  HCT 37.4* 33.0* 32.2* 34.0* 36.8*  MCV 89.3 88.7 89.4 87.4 87.4  PLT 292 252 231 243 629    Basic Metabolic Panel: Recent Labs  Lab 11/02/21 1028 11/03/21 0440 11/04/21 0435 11/05/21 0441 11/06/21 0100  NA 132* 133* 131* 130* 131*  K 3.7 4.3 3.9 4.5 3.9  CL 95* 97* 97* 95* 94*  CO2 27 26 25 26 24   GLUCOSE 153* 185* 175* 137* 110*  BUN 29* 19 26* 22 24*  CREATININE 0.92 0.41* 0.76  0.72 0.76  CALCIUM 8.9 8.7* 8.7* 8.7* 9.0  MG  --   --   --   --  1.8  PHOS  --   --   --   --  3.4    GFR: Estimated Creatinine Clearance: 111.7 mL/min (by C-G formula based on SCr of 0.76 mg/dL). Liver Function Tests: Recent Labs  Lab 11/02/21 1028 11/03/21 0440  11/06/21 0100  AST 20 19 23   ALT 34 30 27  ALKPHOS 66 68 68  BILITOT 0.8 1.1 2.4*  PROT 7.7 6.7 7.7  ALBUMIN 3.2* 2.5* 2.7*    No results for input(s): LIPASE, AMYLASE in the last 168 hours. No results for input(s): AMMONIA in the last 168 hours. Coagulation Profile: Recent Labs  Lab 11/02/21 1642  INR 1.3*    Cardiac Enzymes: No results for input(s): CKTOTAL, CKMB, CKMBINDEX, TROPONINI in the last 168 hours. BNP (last 3 results) No results for input(s): PROBNP in the last 8760 hours. HbA1C: No results for input(s): HGBA1C in the last 72 hours. CBG: Recent Labs  Lab 11/05/21 1626  GLUCAP 133*    Lipid Profile: No results for input(s): CHOL, HDL, LDLCALC, TRIG, CHOLHDL, LDLDIRECT in the last 72 hours. Thyroid Function Tests: No results for input(s): TSH, T4TOTAL, FREET4, T3FREE, THYROIDAB in the last 72 hours. Anemia Panel: No results for input(s): VITAMINB12, FOLATE, FERRITIN, TIBC, IRON, RETICCTPCT in the last 72 hours. Sepsis Labs: Recent Labs  Lab 11/02/21 1632 11/03/21 0440  PROCALCITON  --  2.69  LATICACIDVEN 1.2  --      Recent Results (from the past 240 hour(s))  Blood culture (routine x 2)     Status: Abnormal (Preliminary result)   Collection Time: 11/02/21 12:12 PM   Specimen: BLOOD  Result Value Ref Range Status   Specimen Description   Final    BLOOD RIGHT ANTECUBITAL Performed at [facility], [place] [address], [place], [place] [address]    Special Requests   Final    BOTTLES DRAWN AEROBIC AND ANAEROBIC Blood Culture results may not be optimal due to an excessive volume of blood received in culture bottles Performed at [facility] [address] [place], [place] [address]    Culture  Setup Time   Final    GRAM POSITIVE COCCI IN CLUSTERS IN BOTH AEROBIC AND ANAEROBIC BOTTLES CRITICAL RESULT CALLED TO, READ BACK BY AND VERIFIED WITH: M LILLISTON,PHARMD@0432  11/03/21 [place]    Culture (A)  Final    METHICILLIN RESISTANT STAPHYLOCOCCUS AUREUS Sent to [facility] for further susceptibility testing. Performed at [facility], [place] [address] [place], [place] [address]    Report Status PENDING  Incomplete   Organism ID, Bacteria METHICILLIN RESISTANT STAPHYLOCOCCUS AUREUS  Final      Susceptibility   Methicillin resistant staphylococcus aureus - MIC*    CIPROFLOXACIN >=8 RESISTANT Resistant     ERYTHROMYCIN >=8 RESISTANT Resistant     GENTAMICIN <=0.5 SENSITIVE Sensitive     OXACILLIN >=4 RESISTANT Resistant     TETRACYCLINE <=1 SENSITIVE Sensitive     VANCOMYCIN 1 SENSITIVE Sensitive     TRIMETH/SULFA <=10 SENSITIVE Sensitive     CLINDAMYCIN <=0.25 SENSITIVE Sensitive     RIFAMPIN <=0.5 SENSITIVE Sensitive     Inducible Clindamycin NEGATIVE Sensitive     * METHICILLIN RESISTANT STAPHYLOCOCCUS AUREUS  Blood Culture ID Panel (Reflexed)     Status: Abnormal   Collection Time: 11/02/21 12:12 PM  Result Value Ref Range Status   Enterococcus faecalis NOT DETECTED NOT DETECTED Final   Enterococcus Faecium NOT DETECTED NOT DETECTED Final   Listeria monocytogenes NOT DETECTED NOT DETECTED Final   Staphylococcus species DETECTED (A) NOT DETECTED Final    Comment: CRITICAL RESULT CALLED TO, READ BACK BY AND VERIFIED WITH: M LILLISTON,PHARMD@0431  11/03/21 [place]    Staphylococcus aureus (BCID) DETECTED (A) NOT DETECTED Final    Comment: Methicillin (  oxacillin)-resistant Staphylococcus aureus (MRSA). MRSA is predictably resistant to beta-lactam antibiotics (except ceftaroline). Preferred therapy is vancomycin unless clinically contraindicated. Patient requires contact precautions if  hospitalized. CRITICAL RESULT CALLED TO, READ  BACK BY AND VERIFIED WITH: M LILLISTON,PHARMD@0431  11/03/21 [place]    Staphylococcus epidermidis NOT DETECTED NOT DETECTED Final   Staphylococcus lugdunensis NOT DETECTED NOT DETECTED Final   Streptococcus species NOT DETECTED NOT DETECTED Final   Streptococcus agalactiae NOT DETECTED NOT DETECTED Final   Streptococcus pneumoniae NOT DETECTED NOT DETECTED Final   Streptococcus pyogenes NOT DETECTED NOT DETECTED Final   A.calcoaceticus-baumannii NOT DETECTED NOT DETECTED Final   Bacteroides fragilis NOT DETECTED NOT DETECTED Final   Enterobacterales NOT DETECTED NOT DETECTED Final   Enterobacter cloacae complex NOT DETECTED NOT DETECTED Final   Escherichia coli NOT DETECTED NOT DETECTED Final   Klebsiella aerogenes NOT DETECTED NOT DETECTED Final   Klebsiella oxytoca NOT DETECTED NOT DETECTED Final   Klebsiella pneumoniae NOT DETECTED NOT DETECTED Final   Proteus species NOT DETECTED NOT DETECTED Final   Salmonella species NOT DETECTED NOT DETECTED Final   Serratia marcescens NOT DETECTED NOT DETECTED Final   Haemophilus influenzae NOT DETECTED NOT DETECTED Final   Neisseria meningitidis NOT DETECTED NOT DETECTED Final   Pseudomonas aeruginosa NOT DETECTED NOT DETECTED Final   Stenotrophomonas maltophilia NOT DETECTED NOT DETECTED Final   Candida albicans NOT DETECTED NOT DETECTED Final   Candida auris NOT DETECTED NOT DETECTED Final   Candida glabrata NOT DETECTED NOT DETECTED Final   Candida krusei NOT DETECTED NOT DETECTED Final   Candida parapsilosis NOT DETECTED NOT DETECTED Final   Candida tropicalis NOT DETECTED NOT DETECTED Final   Cryptococcus neoformans/gattii NOT DETECTED NOT DETECTED Final   Meth resistant mecA/C and MREJ DETECTED (A) NOT DETECTED Final    Comment: CRITICAL RESULT CALLED TO, READ BACK BY AND VERIFIED WITH: M LILLISTON,PHARMD@0431  11/03/21 [place] Performed at [facility], [address], [place], [place] [address]   Blood culture (routine x 2)     Status:  Abnormal   Collection Time: 11/02/21 12:17 PM   Specimen: BLOOD  Result Value Ref Range Status   Specimen Description   Final    BLOOD LEFT ANTECUBITAL Performed at [facility] [address] [place], [place] [address]    Special Requests   Final    BOTTLES DRAWN AEROBIC AND ANAEROBIC Blood Culture adequate volume Performed at [facility] [address], [place], [place] [address]    Culture  Setup Time   Final    GRAM POSITIVE COCCI IN CLUSTERS IN BOTH AEROBIC AND ANAEROBIC BOTTLES CRITICAL VALUE NOTED.  VALUE IS CONSISTENT WITH PREVIOUSLY REPORTED AND CALLED VALUE.    Culture (A)  Final    STAPHYLOCOCCUS AUREUS SUSCEPTIBILITIES PERFORMED ON PREVIOUS CULTURE WITHIN THE LAST 5 DAYS. Performed at [facility], [place] [address], [place], [place] [address]    Report Status 11/05/2021 FINAL  Final  Resp Panel by RT-PCR (Flu A&B, Covid) Nasopharyngeal Swab     Status: None   Collection Time: 11/02/21  4:16 PM   Specimen: Nasopharyngeal Swab; Nasopharyngeal(NP) swabs in vial transport medium  Result Value Ref Range Status   SARS Coronavirus 2 by RT PCR NEGATIVE NEGATIVE Final    Comment: (NOTE) SARS-CoV-2 target nucleic acids are NOT DETECTED.  The SARS-CoV-2 RNA is generally detectable in upper respiratory specimens during the acute phase of infection. The lowest concentration of SARS-CoV-2 viral copies this assay can detect  is 138 copies/mL. A negative result does not preclude SARS-Cov-2 infection and should not be used as the sole basis for treatment or other patient management decisions. A negative result may occur with  improper specimen collection/handling, submission of specimen other than nasopharyngeal swab, presence of viral mutation(s) within the areas targeted by this assay, and inadequate number of viral copies(<138 copies/mL). A negative result must be combined with clinical observations, patient history, and  epidemiological information. The expected result is Negative.  Fact Sheet for Patients:  EntrepreneurPulse.com.au  Fact Sheet for Healthcare Providers:  IncredibleEmployment.be  This test is no t yet approved or cleared by the [place] FDA and  has been authorized for detection and/or diagnosis of SARS-CoV-2 by FDA under an Emergency Use Authorization (EUA). This EUA will remain  in effect (meaning this test can be used) for the duration of the COVID-19 declaration under Section 564(b)(1) of the Act, 21 U.S.C.section 360bbb-3(b)(1), unless the authorization is terminated  or revoked sooner.       Influenza A by PCR NEGATIVE NEGATIVE Final   Influenza B by PCR NEGATIVE NEGATIVE Final    Comment: (NOTE) The Xpert Xpress SARS-CoV-2/FLU/RSV plus assay is intended as an aid in the diagnosis of influenza from Nasopharyngeal swab specimens and should not be used as a sole basis for treatment. Nasal washings and aspirates are unacceptable for Xpert Xpress SARS-CoV-2/FLU/RSV testing.  Fact Sheet for Patients: EntrepreneurPulse.com.au  Fact Sheet for Healthcare Providers: [email or web address]  This test is not yet approved or cleared by the [place] FDA and has been authorized for detection and/or diagnosis of SARS-CoV-2 by FDA under an Emergency Use Authorization (EUA). This EUA will remain in effect (meaning this test can be used) for the duration of the COVID-19 declaration under Section 564(b)(1) of the Act, 21 U.S.C. section 360bbb-3(b)(1), unless the authorization is terminated or revoked.  Performed at [facility], [place] [address], [place], [place] [address]   Urine Culture     Status: Abnormal   Collection Time: 11/02/21  4:33 PM   Specimen: In/Out Cath Urine  Result Value Ref Range Status   Specimen Description   Final    IN/OUT CATH URINE Performed at [place] [address] [place], [place] [address]    Special Requests   Final    NONE Performed at [facility], [place] [address], [place], [place] [address]    Culture (A)  Final    >=100,000 COLONIES/mL METHICILLIN RESISTANT STAPHYLOCOCCUS AUREUS   Report Status 11/05/2021 FINAL  Final   Organism ID, Bacteria METHICILLIN RESISTANT STAPHYLOCOCCUS AUREUS (A)  Final      Susceptibility   Methicillin resistant staphylococcus aureus - MIC*    CIPROFLOXACIN >=8 RESISTANT Resistant     GENTAMICIN <=0.5 SENSITIVE Sensitive     NITROFURANTOIN <=16 SENSITIVE Sensitive     OXACILLIN >=4 RESISTANT Resistant     TETRACYCLINE <=1 SENSITIVE Sensitive     VANCOMYCIN 1 SENSITIVE Sensitive     TRIMETH/SULFA <=10 SENSITIVE Sensitive     CLINDAMYCIN <=0.25 SENSITIVE Sensitive     RIFAMPIN <=0.5 SENSITIVE Sensitive     Inducible Clindamycin NEGATIVE Sensitive     * >=100,000 COLONIES/mL METHICILLIN RESISTANT STAPHYLOCOCCUS AUREUS  Culture, blood (routine x 2)     Status: None (Preliminary result)   Collection Time: 11/04/21  5:19 AM   Specimen: BLOOD RIGHT HAND  Result Value Ref Range Status   Specimen Description   Final    BLOOD  RIGHT HAND Performed at [facility], [place] [address], [place], [place] [address]    Special Requests   Final    BOTTLES DRAWN AEROBIC ONLY Blood Culture adequate volume Performed at [place] [address], [place], [place] [address]    Culture   Final    NO GROWTH 2 DAYS Performed at [place] [address] [place], [place] [address]    Report Status PENDING  Incomplete  Culture, blood (routine x 2)     Status: None (Preliminary result)   Collection Time: 11/04/21  5:19 AM   Specimen: BLOOD LEFT HAND  Result Value Ref Range Status   Specimen Description   Final    BLOOD LEFT HAND Performed at [facility], [place] [address], [place], [place] [address]    Special Requests   Final     BOTTLES DRAWN AEROBIC ONLY Blood Culture adequate volume Performed at [place] [address], [place], [place] [address]    Culture   Final    NO GROWTH 2 DAYS Performed at [place] [address], [place], [place] [address]    Report Status PENDING  Incomplete     RN Pressure Injury Documentation:     Estimated body mass index is 33.91 kg/m as calculated from the following:   Height as of this encounter: 6' (1.829 m).   Weight as of this encounter: 113.4 kg.  Malnutrition Type:   Malnutrition Characteristics:   Nutrition Interventions:     Radiology Studies: CT ABDOMEN PELVIS W CONTRAST  Result Date: 11/05/2021 CLINICAL DATA:  Acute abdominal pain. EXAM: CT ABDOMEN AND PELVIS WITH CONTRAST TECHNIQUE: Multidetector CT imaging of the abdomen and pelvis was performed using the standard protocol following bolus administration of intravenous contrast. CONTRAST:  2mL OMNIPAQUE IOHEXOL 350 MG/ML SOLN COMPARISON:  None. FINDINGS: Lower chest: Clear. Hepatobiliary: Gallbladder sludge is present. No calculi are identified. Liver and bile ducts are within normal limits. Pancreas: Unremarkable. No pancreatic ductal dilatation or surrounding inflammatory changes. Spleen: Normal in size without focal abnormality. Adrenals/Urinary Tract: Adrenal glands are unremarkable. Kidneys are normal, without renal calculi, focal lesion, or hydronephrosis. Bladder is unremarkable. Stomach/Bowel: There is a small hiatal hernia. Stomach is otherwise within normal limits. Appendix appears normal. No evidence of bowel wall thickening, distention, or inflammatory changes. There is sigmoid and descending colon diverticulosis without evidence for acute diverticulitis. Vascular/Lymphatic: Aortic atherosclerosis. No enlarged abdominal or pelvic lymph nodes. Reproductive: Prostate is unremarkable. Other: There is no ascites or free air. There is no significant abdominal wall hernia.  There is mild presacral edema. Injection site noted in the subcutaneous tissues of the right anterior abdominal wall. Musculoskeletal: No acute fracture identified. There is disc space narrowing and endplate sclerosis this cystic change at L3-L4 and L4-L5. There is mild stranding and small lymph nodes in the anterior paravertebral soft tissues at this level. No fluid collections are identified. IMPRESSION: 1. Presumably degenerative endplate changes at V8-A6 and L4-L5. There are mild inflammatory changes and small lymph nodes in the anterior paravertebral soft tissues at this level. If there is high clinical concern for infection, recommend MRI. 2. Gallbladder sludge. 3. Colonic diverticulosis without evidence for acute diverticulitis. Electronically Signed   By: Ronney Asters M.D.   On: 11/05/2021 15:40    Scheduled Meds:  albuterol  2.5 mg Nebulization TID   amLODipine  5 mg Oral Daily   atorvastatin  20 mg Oral Daily  enoxaparin (LOVENOX) injection  60 mg Subcutaneous Q24H   losartan  50 mg Oral Daily   mometasone-formoterol  2 puff Inhalation BID   sodium chloride flush  3 mL Intravenous Q12H   Continuous Infusions:  sodium chloride 10 mL/hr at 11/03/21 1032   lactated ringers 75 mL/hr at 11/05/21 2007   magnesium sulfate bolus IVPB 2 g (11/06/21 1517)   vancomycin      LOS: 4 days    Kerney Elbe, DO [facility] PAGER is on [place]  If 7PM-7AM, please contact night-coverage [email or web address]

## 2021-11-06 NOTE — Plan of Care (Signed)
  Problem: Education: Goal: Knowledge of General Education information will improve Description: Including pain rating scale, medication(s)/side effects and non-pharmacologic comfort measures Outcome: Progressing   Problem: Nutrition: Goal: Adequate nutrition will be maintained Outcome: Progressing   Problem: Elimination: Goal: Will not experience complications related to bowel motility Outcome: Progressing   

## 2021-11-07 ENCOUNTER — Encounter (HOSPITAL_COMMUNITY): Payer: Self-pay | Admitting: Internal Medicine

## 2021-11-07 DIAGNOSIS — B9562 Methicillin resistant Staphylococcus aureus infection as the cause of diseases classified elsewhere: Secondary | ICD-10-CM | POA: Diagnosis not present

## 2021-11-07 DIAGNOSIS — J390 Retropharyngeal and parapharyngeal abscess: Secondary | ICD-10-CM | POA: Diagnosis not present

## 2021-11-07 DIAGNOSIS — M4620 Osteomyelitis of vertebra, site unspecified: Secondary | ICD-10-CM | POA: Diagnosis not present

## 2021-11-07 DIAGNOSIS — R7881 Bacteremia: Secondary | ICD-10-CM | POA: Diagnosis not present

## 2021-11-07 DIAGNOSIS — R1031 Right lower quadrant pain: Secondary | ICD-10-CM | POA: Diagnosis not present

## 2021-11-07 DIAGNOSIS — A4102 Sepsis due to Methicillin resistant Staphylococcus aureus: Secondary | ICD-10-CM | POA: Diagnosis not present

## 2021-11-07 LAB — CBC WITH DIFFERENTIAL/PLATELET
Abs Immature Granulocytes: 0.22 10*3/uL — ABNORMAL HIGH (ref 0.00–0.07)
Basophils Absolute: 0 10*3/uL (ref 0.0–0.1)
Basophils Relative: 0 %
Eosinophils Absolute: 0 10*3/uL (ref 0.0–0.5)
Eosinophils Relative: 0 %
HCT: 31 % — ABNORMAL LOW (ref 39.0–52.0)
Hemoglobin: 10.7 g/dL — ABNORMAL LOW (ref 13.0–17.0)
Immature Granulocytes: 2 %
Lymphocytes Relative: 5 %
Lymphs Abs: 0.7 10*3/uL (ref 0.7–4.0)
MCH: 30.6 pg (ref 26.0–34.0)
MCHC: 34.5 g/dL (ref 30.0–36.0)
MCV: 88.6 fL (ref 80.0–100.0)
Monocytes Absolute: 1.3 10*3/uL — ABNORMAL HIGH (ref 0.1–1.0)
Monocytes Relative: 10 %
Neutro Abs: 11.4 10*3/uL — ABNORMAL HIGH (ref 1.7–7.7)
Neutrophils Relative %: 83 %
Platelets: 198 10*3/uL (ref 150–400)
RBC: 3.5 MIL/uL — ABNORMAL LOW (ref 4.22–5.81)
RDW: 15 % (ref 11.5–15.5)
WBC: 13.7 10*3/uL — ABNORMAL HIGH (ref 4.0–10.5)
nRBC: 0 % (ref 0.0–0.2)

## 2021-11-07 LAB — COMPREHENSIVE METABOLIC PANEL
ALT: 26 U/L (ref 0–44)
AST: 20 U/L (ref 15–41)
Albumin: 2.3 g/dL — ABNORMAL LOW (ref 3.5–5.0)
Alkaline Phosphatase: 54 U/L (ref 38–126)
Anion gap: 8 (ref 5–15)
BUN: 27 mg/dL — ABNORMAL HIGH (ref 8–23)
CO2: 24 mmol/L (ref 22–32)
Calcium: 8.3 mg/dL — ABNORMAL LOW (ref 8.9–10.3)
Chloride: 99 mmol/L (ref 98–111)
Creatinine, Ser: 0.75 mg/dL (ref 0.61–1.24)
GFR, Estimated: >60 mL/min (ref >60)
Glucose, Bld: 136 mg/dL — ABNORMAL HIGH (ref 70–99)
Potassium: 3.7 mmol/L (ref 3.5–5.1)
Sodium: 131 mmol/L — ABNORMAL LOW (ref 135–145)
Total Bilirubin: 2.5 mg/dL — ABNORMAL HIGH (ref 0.3–1.2)
Total Protein: 6.5 g/dL (ref 6.5–8.1)

## 2021-11-07 LAB — MAGNESIUM: Magnesium: 2.3 mg/dL (ref 1.7–2.4)

## 2021-11-07 LAB — PHOSPHORUS: Phosphorus: 3.9 mg/dL (ref 2.5–4.6)

## 2021-11-07 MED ORDER — LIP MEDEX EX OINT
TOPICAL_OINTMENT | CUTANEOUS | Status: DC | PRN
  Filled 2021-11-07: qty 7

## 2021-11-07 NOTE — Plan of Care (Signed)
  Problem: Clinical Measurements: Goal: Respiratory complications will improve Outcome: Progressing   Problem: Pain Managment: Goal: General experience of comfort will improve Outcome: Progressing   Problem: Safety: Goal: Ability to remain free from injury will improve Outcome: Progressing   Problem: Education: Goal: Knowledge of General Education information will improve Description: Including pain rating scale, medication(s)/side effects and non-pharmacologic comfort measures Outcome: Not Progressing   Problem: Nutrition: Goal: Adequate nutrition will be maintained Outcome: Not Progressing

## 2021-11-07 NOTE — Progress Notes (Signed)
[facility]  Date of Admission:  11/02/2021      Abx: 11/13-c vanc                                                        Assessment: 70 yo male with asthma, gerd, htn/hlp, recent influenza pna admitted to ED 11/13 with neck pain found to have sepsis and mrsa bsi along with diffuse spinal osseous involvement and retropharyngeal space edema    Mri spine diffuse lumbar epidural enhancement without abscess. L4-5 OM changes. C5-6 prevertebral enhancement no osseous finding   No neurologic deficit. No ams or clinical concern of meningitis at this time   Doesn't appear to be a superimposed mrsa bacterial pna. Unclear source   11/13 bcx mrsa   No hardware/cardiac device  --------- 11/18 assessment Wbc continues improving Mentation up and down but overall much improved No new focal pain outside of headache/neck-lower back pain and right sided superficial abd pain  11/15 bcx repeat ngtd 11/17 repeat bcx positive for gpc clusters   11/14 mri brain difficult but no obvious abscess 11/14 tte technically difficult study, but no obvious vegetation.  11/16 Abd/pelv ct with contrast no renal abscess or psoas muscle abscess mentioned    Plan: Continue vancomycin for now Have asked cardiology for tee given persistent bacteremia; planned for Tuesday next week 11/22 If repeat bcx 11/18 continue to be positive, would consider changing abx to dapto/ceftaroline  F/u repeat bcx result Discussed with primary care    Principal Problem:   Sepsis ([place]) Active Problems:   Hypertension   Hyperlipidemia   Asthma   Acute respiratory failure with hypoxia (HCC)   Vertebral osteomyelitis (HCC)   MRSA bacteremia   Retropharyngeal abscess   Right lower quadrant abdominal pain   Allergies  Allergen Reactions   Elavil [Amitriptyline] Other (See Comments)   Tetracyclines & Related Other (See Comments)    Scheduled Meds:  albuterol  2.5 mg Nebulization  TID   amLODipine  5 mg Oral Daily   atorvastatin  20 mg Oral Daily   enoxaparin (LOVENOX) injection  60 mg Subcutaneous Q24H   losartan  50 mg Oral Daily   mometasone-formoterol  2 puff Inhalation BID   QUEtiapine  25 mg Oral QHS   sodium chloride flush  3 mL Intravenous Q12H   Continuous Infusions:  sodium chloride 10 mL/hr at 11/03/21 1032   vancomycin 1,500 mg (11/07/21 0958)   PRN Meds:.sodium chloride, acetaminophen **OR** acetaminophen, albuterol, haloperidol lactate, lip balm, morphine injection, naLOXone (NARCAN)  injection, ondansetron **OR** ondansetron (ZOFRAN) IV, oxyCODONE-acetaminophen, senna-docusate   SUBJECTIVE: Headache/neck pain/lower backpain still bad but not worse Stable right abd pain No fever Wbc improving No new focal pain No n/v/diarrhea 11/17 bcx gpc clusters  Review of Systems: ROS All other ROS was negative, except mentioned above     OBJECTIVE: Vitals:   11/07/21 0841 11/07/21 0944 11/07/21 1348 11/07/21 1434  BP:  (!) 141/82  (!) 146/88  Pulse:  97  99  Resp:  20    Temp:      TempSrc:      SpO2: 95% 95% 95% 96%  Weight:      Height:       Body mass index is 33.91 kg/m.  Physical Exam  General/constitutional: no distress, pleasant, conversant, appropriate, cooperative HEENT: Normocephalic, PER CV: rrr no mrg Lungs: clear to auscultation, normal respiratory effort Abd: Soft, mild-mod tenderness right side seems very superficial Ext: no edema Skin: No Rash Neuro: nonfocal MSK: no peripheral joint swelling/tenderness/warmth  Central line presence: no  Lab Results Lab Results  Component Value Date   WBC 13.7 (H) 11/07/2021   HGB 10.7 (L) 11/07/2021   HCT 31.0 (L) 11/07/2021   MCV 88.6 11/07/2021   PLT 198 11/07/2021    Lab Results  Component Value Date   CREATININE 0.75 11/07/2021   BUN 27 (H) 11/07/2021   NA 131 (L) 11/07/2021   K 3.7 11/07/2021   CL 99 11/07/2021   CO2 24 11/07/2021    Lab Results  Component  Value Date   ALT 26 11/07/2021   AST 20 11/07/2021   ALKPHOS 54 11/07/2021   BILITOT 2.5 (H) 11/07/2021      Microbiology: Recent Results (from the past 240 hour(s))  Blood culture (routine x 2)     Status: Abnormal (Preliminary result)   Collection Time: 11/02/21 12:12 PM   Specimen: BLOOD  Result Value Ref Range Status   Specimen Description   Final    BLOOD RIGHT ANTECUBITAL Performed at [facility], [address] [place], [place] [address]    Special Requests   Final    BOTTLES DRAWN AEROBIC AND ANAEROBIC Blood Culture results may not be optimal due to an excessive volume of blood received in culture bottles Performed at [facility] [address], [place], [place] [address]    Culture  Setup Time   Final    GRAM POSITIVE COCCI IN CLUSTERS IN BOTH AEROBIC AND ANAEROBIC BOTTLES CRITICAL RESULT CALLED TO, READ BACK BY AND VERIFIED WITH: M LILLISTON,PHARMD@[id number]  11/03/21 [place]    Culture (A)  Final    METHICILLIN RESISTANT STAPHYLOCOCCUS AUREUS Sent to [facility] for further susceptibility testing. Performed at [facility], [place] [address], [place], [place] [address]    Report Status PENDING  Incomplete   Organism ID, Bacteria METHICILLIN RESISTANT STAPHYLOCOCCUS AUREUS  Final      Susceptibility   Methicillin resistant staphylococcus aureus - MIC*    CIPROFLOXACIN >=8 RESISTANT Resistant     ERYTHROMYCIN >=8 RESISTANT Resistant     GENTAMICIN <=0.5 SENSITIVE Sensitive     OXACILLIN >=4 RESISTANT Resistant     TETRACYCLINE <=1 SENSITIVE Sensitive     VANCOMYCIN 1 SENSITIVE Sensitive     TRIMETH/SULFA <=10 SENSITIVE Sensitive     CLINDAMYCIN <=0.25 SENSITIVE Sensitive     RIFAMPIN <=0.5 SENSITIVE Sensitive     Inducible Clindamycin NEGATIVE Sensitive     * METHICILLIN RESISTANT STAPHYLOCOCCUS AUREUS  Blood Culture ID Panel (Reflexed)     Status: Abnormal   Collection Time: 11/02/21 12:12 PM  Result Value Ref Range Status    Enterococcus faecalis NOT DETECTED NOT DETECTED Final   Enterococcus Faecium NOT DETECTED NOT DETECTED Final   Listeria monocytogenes NOT DETECTED NOT DETECTED Final   Staphylococcus species DETECTED (A) NOT DETECTED Final    Comment: CRITICAL RESULT CALLED TO, READ BACK BY AND VERIFIED WITH: M LILLISTON,PHARMD@0431  11/03/21 [place]    Staphylococcus aureus (BCID) DETECTED (A) NOT DETECTED Final    Comment: Methicillin (oxacillin)-resistant Staphylococcus aureus (MRSA). MRSA is predictably resistant to beta-lactam antibiotics (except ceftaroline). Preferred therapy is vancomycin unless clinically contraindicated. Patient requires contact precautions if  hospitalized. CRITICAL RESULT CALLED TO, READ BACK BY AND VERIFIED WITH:  M LILLISTON,PHARMD@0431  11/03/21 [place]    Staphylococcus epidermidis NOT DETECTED NOT DETECTED Final   Staphylococcus lugdunensis NOT DETECTED NOT DETECTED Final   Streptococcus species NOT DETECTED NOT DETECTED Final   Streptococcus agalactiae NOT DETECTED NOT DETECTED Final   Streptococcus pneumoniae NOT DETECTED NOT DETECTED Final   Streptococcus pyogenes NOT DETECTED NOT DETECTED Final   A.calcoaceticus-baumannii NOT DETECTED NOT DETECTED Final   Bacteroides fragilis NOT DETECTED NOT DETECTED Final   Enterobacterales NOT DETECTED NOT DETECTED Final   Enterobacter cloacae complex NOT DETECTED NOT DETECTED Final   Escherichia coli NOT DETECTED NOT DETECTED Final   Klebsiella aerogenes NOT DETECTED NOT DETECTED Final   Klebsiella oxytoca NOT DETECTED NOT DETECTED Final   Klebsiella pneumoniae NOT DETECTED NOT DETECTED Final   Proteus species NOT DETECTED NOT DETECTED Final   Salmonella species NOT DETECTED NOT DETECTED Final   Serratia marcescens NOT DETECTED NOT DETECTED Final   Haemophilus influenzae NOT DETECTED NOT DETECTED Final   Neisseria meningitidis NOT DETECTED NOT DETECTED Final   Pseudomonas aeruginosa NOT DETECTED NOT DETECTED Final   Stenotrophomonas  maltophilia NOT DETECTED NOT DETECTED Final   Candida albicans NOT DETECTED NOT DETECTED Final   Candida auris NOT DETECTED NOT DETECTED Final   Candida glabrata NOT DETECTED NOT DETECTED Final   Candida krusei NOT DETECTED NOT DETECTED Final   Candida parapsilosis NOT DETECTED NOT DETECTED Final   Candida tropicalis NOT DETECTED NOT DETECTED Final   Cryptococcus neoformans/gattii NOT DETECTED NOT DETECTED Final   Meth resistant mecA/C and MREJ DETECTED (A) NOT DETECTED Final    Comment: CRITICAL RESULT CALLED TO, READ BACK BY AND VERIFIED WITH: M LILLISTON,PHARMD@0431  11/03/21 [place] Performed at [facility], [address] [place], [place] [address]   Blood culture (routine x 2)     Status: Abnormal   Collection Time: 11/02/21 12:17 PM   Specimen: BLOOD  Result Value Ref Range Status   Specimen Description   Final    BLOOD LEFT ANTECUBITAL Performed at [facility] [address], [place], [place] [address]    Special Requests   Final    BOTTLES DRAWN AEROBIC AND ANAEROBIC Blood Culture adequate volume Performed at [facility] [address] [place], [place] [address]    Culture  Setup Time   Final    GRAM POSITIVE COCCI IN CLUSTERS IN BOTH AEROBIC AND ANAEROBIC BOTTLES CRITICAL VALUE NOTED.  VALUE IS CONSISTENT WITH PREVIOUSLY REPORTED AND CALLED VALUE.    Culture (A)  Final    STAPHYLOCOCCUS AUREUS SUSCEPTIBILITIES PERFORMED ON PREVIOUS CULTURE WITHIN THE LAST 5 DAYS. Performed at [facility], [place] [address], [place], [place] [address]    Report Status 11/05/2021 FINAL  Final  Resp Panel by RT-PCR (Flu A&B, Covid) Nasopharyngeal Swab     Status: None   Collection Time: 11/02/21  4:16 PM   Specimen: Nasopharyngeal Swab; Nasopharyngeal(NP) swabs in vial transport medium  Result Value Ref Range Status   SARS Coronavirus 2 by RT PCR NEGATIVE NEGATIVE Final    Comment: (NOTE) SARS-CoV-2 target nucleic acids are NOT  DETECTED.  The SARS-CoV-2 RNA is generally detectable in upper respiratory specimens during the acute phase of infection. The lowest concentration of SARS-CoV-2 viral copies this assay can detect is 138 copies/mL. A negative result does not preclude SARS-Cov-2 infection and should not be used as the sole basis for treatment or other patient management decisions. A negative result may occur with  improper specimen collection/handling,  submission of specimen other than nasopharyngeal swab, presence of viral mutation(s) within the areas targeted by this assay, and inadequate number of viral copies(<138 copies/mL). A negative result must be combined with clinical observations, patient history, and epidemiological information. The expected result is Negative.  Fact Sheet for Patients:  EntrepreneurPulse.com.au  Fact Sheet for Healthcare Providers:  IncredibleEmployment.be  This test is no t yet approved or cleared by the [place] FDA and  has been authorized for detection and/or diagnosis of SARS-CoV-2 by FDA under an Emergency Use Authorization (EUA). This EUA will remain  in effect (meaning this test can be used) for the duration of the COVID-19 declaration under Section 564(b)(1) of the Act, 21 U.S.C.section 360bbb-3(b)(1), unless the authorization is terminated  or revoked sooner.       Influenza A by PCR NEGATIVE NEGATIVE Final   Influenza B by PCR NEGATIVE NEGATIVE Final    Comment: (NOTE) The Xpert Xpress SARS-CoV-2/FLU/RSV plus assay is intended as an aid in the diagnosis of influenza from Nasopharyngeal swab specimens and should not be used as a sole basis for treatment. Nasal washings and aspirates are unacceptable for Xpert Xpress SARS-CoV-2/FLU/RSV testing.  Fact Sheet for Patients: EntrepreneurPulse.com.au  Fact Sheet for Healthcare Providers: IncredibleEmployment.be  This test is not yet  approved or cleared by the [place] FDA and has been authorized for detection and/or diagnosis of SARS-CoV-2 by FDA under an Emergency Use Authorization (EUA). This EUA will remain in effect (meaning this test can be used) for the duration of the COVID-19 declaration under Section 564(b)(1) of the Act, 21 U.S.C. section 360bbb-3(b)(1), unless the authorization is terminated or revoked.  Performed at [facility], [place] [address] [place], [place] [address]   Urine Culture     Status: Abnormal   Collection Time: 11/02/21  4:33 PM   Specimen: In/Out Cath Urine  Result Value Ref Range Status   Specimen Description   Final    IN/OUT CATH URINE Performed at [facility] [address] [place], [place] [address]    Special Requests   Final    NONE Performed at [facility], [place] [address], [place], [place] [address]    Culture (A)  Final    >=100,000 COLONIES/mL METHICILLIN RESISTANT STAPHYLOCOCCUS AUREUS   Report Status 11/05/2021 FINAL  Final   Organism ID, Bacteria METHICILLIN RESISTANT STAPHYLOCOCCUS AUREUS (A)  Final      Susceptibility   Methicillin resistant staphylococcus aureus - MIC*    CIPROFLOXACIN >=8 RESISTANT Resistant     GENTAMICIN <=0.5 SENSITIVE Sensitive     NITROFURANTOIN <=16 SENSITIVE Sensitive     OXACILLIN >=4 RESISTANT Resistant     TETRACYCLINE <=1 SENSITIVE Sensitive     VANCOMYCIN 1 SENSITIVE Sensitive     TRIMETH/SULFA <=10 SENSITIVE Sensitive     CLINDAMYCIN <=0.25 SENSITIVE Sensitive     RIFAMPIN <=0.5 SENSITIVE Sensitive     Inducible Clindamycin NEGATIVE Sensitive     * >=100,000 COLONIES/mL METHICILLIN RESISTANT STAPHYLOCOCCUS AUREUS  Culture, blood (routine x 2)     Status: None (Preliminary result)   Collection Time: 11/04/21  5:19 AM   Specimen: BLOOD RIGHT HAND  Result Value Ref Range Status   Specimen Description   Final    BLOOD RIGHT HAND Performed at [facility], [address], [place], [place] [address]    Special Requests   Final    BOTTLES DRAWN AEROBIC ONLY Blood Culture adequate volume Performed at [facility]  [place] [address], [place], [place] [address]    Culture   Final    NO GROWTH 3 DAYS Performed at [facility], [place] [address], [place], [place] [address]    Report Status PENDING  Incomplete  Culture, blood (routine x 2)     Status: None (Preliminary result)   Collection Time: 11/04/21  5:19 AM   Specimen: BLOOD LEFT HAND  Result Value Ref Range Status   Specimen Description   Final    BLOOD LEFT HAND Performed at [facility], [place] [address], [place], [place] [address]    Special Requests   Final    BOTTLES DRAWN AEROBIC ONLY Blood Culture adequate volume Performed at [facility] [address] [place], [place] [address]    Culture   Final    NO GROWTH 3 DAYS Performed at [facility], [place] [address], [place], [place] [address]    Report Status PENDING  Incomplete  Culture, blood (routine x 2)     Status: None (Preliminary result)   Collection Time: 11/06/21  5:48 AM   Specimen: BLOOD RIGHT HAND  Result Value Ref Range Status   Specimen Description   Final    BLOOD RIGHT HAND Performed at [facility] [address] [place], [place] [address]    Special Requests   Final    BOTTLES DRAWN AEROBIC ONLY Blood Culture adequate volume Performed at [facility] [address] [place], [place] [address]    Culture   Final    NO GROWTH 1 DAY Performed at [facility], [place] [address], [place], [place] [address]    Report Status PENDING  Incomplete  Culture, blood (routine x 2)     Status: Abnormal (Preliminary result)   Collection Time: 11/06/21  5:52 AM   Specimen: BLOOD LEFT HAND  Result Value Ref Range Status   Specimen Description   Final    BLOOD LEFT HAND Performed at [facility]  [address], [place], [place] [address]    Special Requests   Final    BOTTLES DRAWN AEROBIC AND ANAEROBIC Blood Culture adequate volume Performed at [facility] [address], [place], [place] [address]    Culture  Setup Time   Final    GRAM POSITIVE COCCI ANAEROBIC BOTTLE ONLY CRITICAL VALUE NOTED.  VALUE IS CONSISTENT WITH PREVIOUSLY REPORTED AND CALLED VALUE.    Culture (A)  Final    STAPHYLOCOCCUS AUREUS CULTURE REINCUBATED FOR BETTER GROWTH Performed at [facility], [place] [address], [place],  [address]    Report Status PENDING  Incomplete     Serology:   Imaging: If present, new imagings (plain films, ct scans, and mri) have been personally visualized and interpreted; radiology reports have been reviewed. Decision making incorporated into the Impression / Recommendations.  11/13 cxr Bilateral patchy airspace disease is likely slightly decreased compared with prior exam, although evaluation is somewhat limited due to differences in technique. Recommend follow-up PA and lateral chest x-ray in 6-8 weeks to ensure complete resolution.   11/13 ct angio head/neck 1. Retropharyngeal edema without visible pharyngeal or spinal source, suggest enhanced cervical MRI and inflammatory labs. 2. Mild for age atherosclerosis.   11/14 mri entire spine 1. Fluid and enhancement within the disc space at L4-5 with adjacent edema and enhancement of the endplates. 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 2. Extensive epidural enhancement in  the lower lumbar spine L2-3 through the sacrum compatible with infection. Recommend lumbar puncture further evaluation. 3. Prevertebral edema with peripheral postcontrast enhancement extending from the skull base through C5-6. Focal source for infection in the upper cervical spine not identified. 4. The study is moderately degraded by patient motion. 5. Multilevel spondylosis of the cervical spine as  described. 6. Diffuse epidural enhancement at L2-3, L3-4, L4-5, and L5-S1 without discrete epidural abscess. 7. Moderate central canal and bilateral foraminal stenosis at L3-4. 8. Mild foraminal narrowing bilaterally at T7-8, T8-9 and T9-10 secondary to facet spurring.    11/14 mri brain 1. Severely motion degraded examination without evidence of acute intracranial abnormality. 2. Mild-to-moderate chronic small vessel ischemic disease.  11/15 tte  1. Left ventricular ejection fraction, by estimation, is 55 to 60%. The  left ventricle has normal function. The left ventricle has no regional  wall motion abnormalities. There is mild concentric left ventricular  hypertrophy. Left ventricular diastolic  parameters are consistent with Grade I diastolic dysfunction (impaired  relaxation).   2. Right ventricular systolic function is normal. The right ventricular  size is normal.   3. The mitral valve is normal in structure. No evidence of mitral valve  regurgitation. No evidence of mitral stenosis.   4. The aortic valve is normal in structure. Aortic valve regurgitation is  not visualized. No aortic stenosis is present.   5. There is borderline dilatation of the ascending aorta, measuring 36  mm.   6. The inferior vena cava is normal in size with greater than 50%  respiratory variability, suggesting right atrial pressure of 3 mmHg.   11/16 abd pelv ct with contrast 1. Presumably degenerative endplate changes at Y5-R1 and L4-L5. There are mild inflammatory changes and small lymph nodes in the anterior paravertebral soft tissues at this level. If there is high clinical concern for infection, recommend MRI. 2. Gallbladder sludge. 3. Colonic diverticulosis without evidence for acute diverticulitis.  Jabier Mutton, [facility] [phone] pager    11/07/2021, 4:59 PM

## 2021-11-07 NOTE — Progress Notes (Signed)
PROGRESS NOTE    Charles Weiss.  ZOX:[id number] DOB: 05-Mar-1951 DOA: 11/02/2021 PCP: System, Provider Not In  Brief Narrative:  The patient is a 70 year old Caucasian male with a past medical history significant for but limited to asthma, hypertension as well as other comorbidities with recent admission for influenza pneumonia and respiratory failure who came back to the emergency room with neck pain and headache.  He is noted to have a high fever and upon further work-up he is found to have evidence of discitis in the lumbar vertebra.  Blood cultures were positive for MRSA.  He was started on vancomycin and ID was consulted.  He is undergoing further work-up and his mental status is a little off and he is agitated.  MRI was unrevealing.  ID recommending repeating MRI later this week if mentation has not improved.  Continues to be extremely confused and is in soft wrist restraints given that he is a danger to himself given his mentation but was a little better so restraints were removed.  Continues to have significant right-sided abdominal pain but CT scan of the abdomen pelvis done as below.  Assessment & Plan:   Principal Problem:   Sepsis ([place]) Active Problems:   Hypertension   Hyperlipidemia   Asthma   Acute respiratory failure with hypoxia (HCC)   Vertebral osteomyelitis (HCC)   MRSA bacteremia   Retropharyngeal abscess   Right lower quadrant abdominal pain  Sepsis secondary to MRSA bacteremia -Noted to be febrile with T-max of 103.4, tachycardic heart rate in the 110s, tachypneic source of infection is discitis.  Evidence of endorgan damage includes encephalopathy related to sepsis. Now Still febrile an had a Temperature of 100.8 this AM  -Infectious disease following and recommending continuing daily evaluation for CNS symptoms and repeat MRI at the end of the week if persistent or worsening headache or other CNS symptoms -Currently on vancomycin and ID is following and  they are recommending continue vancomycin for now -Repeat blood culture 11/04/21 Negative but 11/06/21 still persistently Positive  -Acute urinary 100 MLS per hour but will reduce to 75 MLS per hour and will continue for now -Will eventually need PICC line once cultures have cleared -ID is recommending obtaining repeat blood cultures today -2D echocardiogram performed that was unremarkable but will need TEE given persistent bacteremia will obtain TEE and will be done Tuesday  -PT OT recommending SNF   Discitis/vertebral osteomyelitis -Noted to have significant discitis and lumbar vertebral/sacrum -Also noted to have significant epidural enhancement -Continue on IV antibiotics total of 8 weeks -Discussed with infectious disease/neurology was not felt that lumbar puncture would change management at this point -Checking ECHOCardiogram as above -WBC has gone from 24.0 -> 21.4 -> 16.9 -> 13.7 -Repeat blood cultures x2 from 01/04/2021 showed no growth to date at 2 days and they are being repeated again on 11/06/2021 and still pending   Abdominal pain -Noted to have diffuse abdominal tenderness -Unclear if this is contributing to underlying sepsis -Check CT abdomen and showed "Presumably degenerative endplate changes at W1-X9 and L4-L5. There are mild inflammatory changes and small lymph nodes in the anterior paravertebral soft tissues at this level. If there is high clinical concern for infection, recommend MRI.  Gallbladder sludge. Colonic diverticulosis without evidence for acute diverticulitis." -ID is continuing IV vancomycin and recommending follow-up with Romycin sensibilities   Acute Encephalopathy with Agitation, mildly improved  -Related to sepsis and likely infection -He is intermittently agitated and confused and combative -C/w  Lorazepam 1 mg po/IV q6hprn and I I will will discontinue and start the patient on Haldol 2 mg every 6 as needed -Place on Delirium Precautions -MRI Brain done  and showed "Severely motion degraded examination without evidence of acute intracranial abnormality. Mild-to-moderate chronic small vessel ischemic disease."  -Continue to monitor -Could be related to pain as his mentation improved with better pain control -Patient is currently in wrist restraints and will -ID recommending repeating MRI at the end of this week if he continues with persistent worsening headache or other CNS symptoms -We will place on delirium precautions   Hypertension -Currently on amlodipine and losartan and being continued on both of those with amlodipine 5 mg p.o. daily and losartan 50 mg p.o. daily -Holding hydrochlorothiazide since it is likely following completed -Blood pressure likely further elevated due to pain -Continue to Monitor BP per Protocol -Last BP reading was 146/68   Hyperlipidemia -Continued on Atorvastatin 20 g p.o. daily   Sore throat -Noted on CT scan to have retropharyngeal erythema.  No comment of obvious abscess no CT -EDP discussed with ENT who recommended conservative management with IV antibiotics -Was also recommended that he receive Decadron 8 mg every 8 hours x 3 doses; has completed his Decadron doses -If symptoms worsen over the next 24 to 48 hours, recommendations are for reimaging but did not complain of Sore Throat today  -Continue to Monitor closely    Asthma -Currently no wheezing -Continue on bronchodilators, Dulera -Continue to Monitor Respiratory Status carefully -SpO2: 96 % O2 Flow Rate (L/min): 2 L/min  Hyponatremia -Patient's sodium went from 131 -> 130 -> 131 x2 -Continued with IV fluid hydration as above -Repeat CMP in the a.m.  Normocytic Anemia -Patient's hemoglobin/hematocrit went from 11.2/32.2 -> 11.9/34.0 -> 12.7/36.8 -> 10.7/31.0 -Check anemia panel in the a.m. -Continue to monitor for signs and symptoms of bleeding; currently no overt bleeding noted -Repeat CBC in a.m.  Obesity -Complicates overall  prognosis and care -Estimated body mass index is 33.91 kg/m as calculated from the following:   Height as of this encounter: 6' (1.829 m).   Weight as of this encounter: 113.4 kg. -Weight loss and Dietary Counseling given   DVT prophylaxis: Enoxaparin Code Status: DO NOT RESUSCITATE Family Communication: No family currently at bedside but I spoke with his Sister over the phone yesterday  Disposition Plan: Pending further clinical improvement and clearance by ID  Status is: Inpatient  Remains inpatient appropriate because: Remains somewhat confused and encephalopathic we will need ID clearance and antibiotic recommendations prior to safe discharge disposition   Consultants:  Infectious Diseases  Procedures:  ECHOCARDIOGRAM IMPRESSIONS     1. Left ventricular ejection fraction, by estimation, is 55 to 60%. The  left ventricle has normal function. The left ventricle has no regional  wall motion abnormalities. There is mild concentric left ventricular  hypertrophy. Left ventricular diastolic  parameters are consistent with Grade I diastolic dysfunction (impaired  relaxation).   2. Right ventricular systolic function is normal. The right ventricular  size is normal.   3. The mitral valve is normal in structure. No evidence of mitral valve  regurgitation. No evidence of mitral stenosis.   4. The aortic valve is normal in structure. Aortic valve regurgitation is  not visualized. No aortic stenosis is present.   5. There is borderline dilatation of the ascending aorta, measuring 36  mm.   6. The inferior vena cava is normal in size with greater than 50%  respiratory variability,  suggesting right atrial pressure of 3 mmHg.   Comparison(s): No prior Echocardiogram.   FINDINGS   Left Ventricle: Left ventricular ejection fraction, by estimation, is 55  to 60%. The left ventricle has normal function. The left ventricle has no  regional wall motion abnormalities. Definity contrast  agent was given IV  to delineate the left ventricular   endocardial borders. The left ventricular internal cavity size was normal  in size. There is mild concentric left ventricular hypertrophy. Left  ventricular diastolic parameters are consistent with Grade I diastolic  dysfunction (impaired relaxation).   Right Ventricle: The right ventricular size is normal. No increase in  right ventricular wall thickness. Right ventricular systolic function is  normal.   Left Atrium: Left atrial size was normal in size.   Right Atrium: Right atrial size was normal in size.   Pericardium: There is no evidence of pericardial effusion. Presence of  epicardial fat layer.   Mitral Valve: The mitral valve is normal in structure. No evidence of  mitral valve regurgitation. No evidence of mitral valve stenosis. MV peak  gradient, 11.7 mmHg. The mean mitral valve gradient is 5.0 mmHg.   Tricuspid Valve: The tricuspid valve is normal in structure. Tricuspid  valve regurgitation is not demonstrated. No evidence of tricuspid  stenosis.   Aortic Valve: The aortic valve is normal in structure. Aortic valve  regurgitation is not visualized. No aortic stenosis is present.   Pulmonic Valve: The pulmonic valve was normal in structure. Pulmonic valve  regurgitation is not visualized. No evidence of pulmonic stenosis.   Aorta: There is borderline dilatation of the ascending aorta, measuring 36  mm.   Venous: The inferior vena cava is normal in size with greater than 50%  respiratory variability, suggesting right atrial pressure of 3 mmHg.   IAS/Shunts: No atrial level shunt detected by color flow Doppler.      LEFT VENTRICLE  PLAX 2D  LVIDd:         4.50 cm      Diastology  LVIDs:         3.60 cm      LV e' medial:    19.00 cm/s  LV PW:         1.07 cm      LV E/e' medial:  8.2  LV IVS:        1.04 cm      LV e' lateral:   17.90 cm/s  LVOT diam:     2.10 cm      LV E/e' lateral: 8.7  LV SV:          95  LV SV Index:   40  LVOT Area:     3.46 cm     LV Volumes (MOD)  LV vol d, MOD A2C: 104.2 ml  LV vol d, MOD A4C: 112.0 ml  LV vol s, MOD A2C: 45.8 ml  LV vol s, MOD A4C: 54.9 ml  LV SV MOD A2C:     58.3 ml  LV SV MOD A4C:     112.0 ml  LV SV MOD BP:      57.0 ml   RIGHT VENTRICLE            IVC  RV S prime:     9.36 cm/s  IVC diam: 2.60 cm  TAPSE (M-mode): 1.4 cm   LEFT ATRIUM             Index  RIGHT ATRIUM           Index  LA diam:        3.20 cm 1.37 cm/m   RA Area:     11.50 cm  LA Vol (A2C):   73.4 ml 31.33 ml/m  RA Volume:   24.70 ml  10.54 ml/m  LA Vol (A4C):   26.5 ml 11.31 ml/m  LA Biplane Vol: 45.0 ml 19.21 ml/m   AORTIC VALVE  LVOT Vmax:   141.00 cm/s  LVOT Vmean:  94.600 cm/s  LVOT VTI:    0.273 m     AORTA  Ao Root diam: 3.80 cm  Ao Asc diam:  3.60 cm   MITRAL VALVE  MV Area (PHT): 3.60 cm     SHUNTS  MV Area VTI:   3.25 cm     Systemic VTI:  0.27 m  MV Peak grad:  11.7 mmHg    Systemic Diam: 2.10 cm  MV Mean grad:  5.0 mmHg  MV Vmax:       1.71 m/s  MV Vmean:      101.0 cm/s  MV Decel Time: 211 msec  MV E velocity: 155.00 cm/s   Antimicrobials:  Anti-infectives (From admission, onward)    Start     Dose/Rate Route Frequency Ordered Stop   11/06/21 2200  vancomycin (VANCOREADY) IVPB 1500 mg/300 mL        1,500 mg 150 mL/hr over 120 Minutes Intravenous Every 12 hours 11/06/21 1223     11/03/21 1400  ceFEPIme (MAXIPIME) 2 g in sodium chloride 0.9 % 100 mL IVPB  Status:  Discontinued        2 g 200 mL/hr over 30 Minutes Intravenous Every 8 hours 11/03/21 1048 11/03/21 1119   11/03/21 1200  ampicillin (OMNIPEN) 1 g in sodium chloride 0.9 % 100 mL IVPB  Status:  Discontinued        1 g 300 mL/hr over 20 Minutes Intravenous Every 6 hours 11/03/21 1041 11/03/21 1119   11/03/21 0600  metroNIDAZOLE (FLAGYL) IVPB 500 mg  Status:  Discontinued        500 mg 100 mL/hr over 60 Minutes Intravenous Every 12 hours 11/02/21 2031 11/02/21 2248    11/03/21 0200  ceFEPIme (MAXIPIME) 2 g in sodium chloride 0.9 % 100 mL IVPB  Status:  Discontinued        2 g 200 mL/hr over 30 Minutes Intravenous Every 8 hours 11/02/21 2101 11/03/21 0445   11/03/21 0100  acyclovir (ZOVIRAX) 775 mg in dextrose 5 % 100 mL IVPB  Status:  Discontinued        10 mg/kg  77.6 kg (Ideal) 115.5 mL/hr over 60 Minutes Intravenous Every 8 hours 11/02/21 2308 11/03/21 0445   11/03/21 0000  ampicillin (OMNIPEN) 2 g in sodium chloride 0.9 % 100 mL IVPB  Status:  Discontinued        2 g 300 mL/hr over 20 Minutes Intravenous Every 4 hours 11/02/21 2308 11/03/21 0445   11/02/21 2200  vancomycin (VANCOREADY) IVPB 1000 mg/200 mL  Status:  Discontinued        1,000 mg 200 mL/hr over 60 Minutes Intravenous Every 12 hours 11/02/21 2101 11/06/21 1223   11/02/21 1700  ceFEPIme (MAXIPIME) 2 g in sodium chloride 0.9 % 100 mL IVPB        2 g 200 mL/hr over 30 Minutes Intravenous  Once 11/02/21 1646 11/02/21 1805   11/02/21 1700  metroNIDAZOLE (FLAGYL) IVPB 500 mg  500 mg 100 mL/hr over 60 Minutes Intravenous  Once 11/02/21 1646 11/02/21 2015   11/02/21 1700  vancomycin (VANCOCIN) IVPB 1000 mg/200 mL premix        1,000 mg 200 mL/hr over 60 Minutes Intravenous  Once 11/02/21 1646 11/02/21 2130        Subjective: Seen and examined at bedside an he was little confused still but had improved.  A little somnolent this morning.  Restraints have been removed.  Continues to have some abdominal pain. Not as confused but resting.   Objective: Vitals:   11/07/21 0841 11/07/21 0944 11/07/21 1348 11/07/21 1434  BP:  (!) 141/82  (!) 146/88  Pulse:  97  99  Resp:  20    Temp:      TempSrc:      SpO2: 95% 95% 95% 96%  Weight:      Height:        Intake/Output Summary (Last 24 hours) at 11/07/2021 1616 Last data filed at 11/07/2021 1603 Gross per 24 hour  Intake 120 ml  Output 550 ml  Net -430 ml    Filed Weights   11/02/21 0701  Weight: 113.4 kg    Examination: Physical Exam:  Constitutional: WN/WD obese Caucasian male  who is resting and no longer in wrist restraints.  He is a little sleepy and somnolent Eyes: Lids and conjunctivae normal, sclerae anicteric  ENMT: External Ears, Nose appear normal. Grossly normal hearing. Mucous membranes are moist.  Neck: Appears normal, supple, no cervical masses, normal ROM, no appreciable thyromegaly; no appreciable JVD Respiratory: Diminished to auscultation bilaterally, no wheezing, rales, rhonchi or crackles. Normal respiratory effort and patient is not tachypenic. No accessory muscle use.  Unlabored breathing Cardiovascular: RRR, no murmurs / rubs / gallops. S1 and S2 auscultated.  Has mild 1+ lower extremity edema Abdomen: Soft, tender to palpate, distended secondary to body habitus.  Bowel sounds positive.  GU: Deferred. Musculoskeletal: No clubbing / cyanosis of digits/nails. No joint deformity upper and lower extremities.  Skin: No rashes, lesions, ulcers on limited skin evaluation. No induration; Warm and dry.  Neurologic: CN 2-12 grossly intact with no focal deficits. Romberg sign and cerebellar reflexes not assessed.  Psychiatric: Impaired judgment and insight.  Somnolent and drowsy. Normal mood and appropriate affect.   Data Reviewed: I have personally reviewed following labs and imaging studies  CBC: Recent Labs  Lab 11/02/21 0859 11/03/21 0440 11/04/21 0435 11/05/21 0441 11/06/21 0100 11/07/21 0419  WBC 19.4* 30.0* 24.0* 21.4* 16.9* 13.7*  NEUTROABS 17.4*  --   --   --  14.3* 11.4*  HGB 13.1 11.5* 11.2* 11.9* 12.7* 10.7*  HCT 37.4* 33.0* 32.2* 34.0* 36.8* 31.0*  MCV 89.3 88.7 89.4 87.4 87.4 88.6  PLT 292 252 231 243 218 557    Basic Metabolic Panel: Recent Labs  Lab 11/03/21 0440 11/04/21 0435 11/05/21 0441 11/06/21 0100 11/07/21 0419  NA 133* 131* 130* 131* 131*  K 4.3 3.9 4.5 3.9 3.7  CL 97* 97* 95* 94* 99  CO2 26 25 26 24 24   GLUCOSE 185* 175* 137* 110*  136*  BUN 19 26* 22 24* 27*  CREATININE 0.41* 0.76 0.72 0.76 0.75  CALCIUM 8.7* 8.7* 8.7* 9.0 8.3*  MG  --   --   --  1.8 2.3  PHOS  --   --   --  3.4 3.9    GFR: Estimated Creatinine Clearance: 111.7 mL/min (by C-G formula based on SCr of 0.75 mg/dL). Liver Function  Tests: Recent Labs  Lab 11/02/21 1028 11/03/21 0440 11/06/21 0100 11/07/21 0419  AST 20 19 23 20   ALT 34 30 27 26   ALKPHOS 66 68 68 54  BILITOT 0.8 1.1 2.4* 2.5*  PROT 7.7 6.7 7.7 6.5  ALBUMIN 3.2* 2.5* 2.7* 2.3*    No results for input(s): LIPASE, AMYLASE in the last 168 hours. No results for input(s): AMMONIA in the last 168 hours. Coagulation Profile: Recent Labs  Lab 11/02/21 1642  INR 1.3*    Cardiac Enzymes: No results for input(s): CKTOTAL, CKMB, CKMBINDEX, TROPONINI in the last 168 hours. BNP (last 3 results) No results for input(s): PROBNP in the last 8760 hours. HbA1C: No results for input(s): HGBA1C in the last 72 hours. CBG: Recent Labs  Lab 11/05/21 1626  GLUCAP 133*    Lipid Profile: No results for input(s): CHOL, HDL, LDLCALC, TRIG, CHOLHDL, LDLDIRECT in the last 72 hours. Thyroid Function Tests: No results for input(s): TSH, T4TOTAL, FREET4, T3FREE, THYROIDAB in the last 72 hours. Anemia Panel: No results for input(s): VITAMINB12, FOLATE, FERRITIN, TIBC, IRON, RETICCTPCT in the last 72 hours. Sepsis Labs: Recent Labs  Lab 11/02/21 1632 11/03/21 0440  PROCALCITON  --  2.69  LATICACIDVEN 1.2  --      Recent Results (from the past 240 hour(s))  Blood culture (routine x 2)     Status: Abnormal (Preliminary result)   Collection Time: 11/02/21 12:12 PM   Specimen: BLOOD  Result Value Ref Range Status   Specimen Description   Final    BLOOD RIGHT ANTECUBITAL Performed at [facility], [place] [address] [place], [place] [address]    Special Requests   Final    BOTTLES DRAWN AEROBIC AND ANAEROBIC Blood Culture results may not be optimal due to an excessive  volume of blood received in culture bottles Performed at [facility] [address], [place], [place] [address]    Culture  Setup Time   Final    GRAM POSITIVE COCCI IN CLUSTERS IN BOTH AEROBIC AND ANAEROBIC BOTTLES CRITICAL RESULT CALLED TO, READ BACK BY AND VERIFIED WITH: M LILLISTON,PHARMD@0432  11/03/21 [place]    Culture (A)  Final    METHICILLIN RESISTANT STAPHYLOCOCCUS AUREUS Sent to [place] for further susceptibility testing. Performed at [facility], [place] [address] [place],  [address]    Report Status PENDING  Incomplete   Organism ID, Bacteria METHICILLIN RESISTANT STAPHYLOCOCCUS AUREUS  Final      Susceptibility   Methicillin resistant staphylococcus aureus - MIC*    CIPROFLOXACIN >=8 RESISTANT Resistant     ERYTHROMYCIN >=8 RESISTANT Resistant     GENTAMICIN <=0.5 SENSITIVE Sensitive     OXACILLIN >=4 RESISTANT Resistant     TETRACYCLINE <=1 SENSITIVE Sensitive     VANCOMYCIN 1 SENSITIVE Sensitive     TRIMETH/SULFA <=10 SENSITIVE Sensitive     CLINDAMYCIN <=0.25 SENSITIVE Sensitive     RIFAMPIN <=0.5 SENSITIVE Sensitive     Inducible Clindamycin NEGATIVE Sensitive     * METHICILLIN RESISTANT STAPHYLOCOCCUS AUREUS  Blood Culture ID Panel (Reflexed)     Status: Abnormal   Collection Time: 11/02/21 12:12 PM  Result Value Ref Range Status   Enterococcus faecalis NOT DETECTED NOT DETECTED Final   Enterococcus Faecium NOT DETECTED NOT DETECTED Final   Listeria monocytogenes NOT DETECTED NOT DETECTED Final   Staphylococcus species DETECTED (A) NOT DETECTED Final    Comment: CRITICAL RESULT CALLED TO, READ BACK BY AND VERIFIED WITH: M LILLISTON,PHARMD@0431  11/03/21  [place]    Staphylococcus aureus (BCID) DETECTED (A) NOT DETECTED Final    Comment: Methicillin (oxacillin)-resistant Staphylococcus aureus (MRSA). MRSA is predictably resistant to beta-lactam antibiotics (except ceftaroline). Preferred therapy is vancomycin unless clinically  contraindicated. Patient requires contact precautions if  hospitalized. CRITICAL RESULT CALLED TO, READ BACK BY AND VERIFIED WITH: M LILLISTON,PHARMD@0431  11/03/21 [place]    Staphylococcus epidermidis NOT DETECTED NOT DETECTED Final   Staphylococcus lugdunensis NOT DETECTED NOT DETECTED Final   Streptococcus species NOT DETECTED NOT DETECTED Final   Streptococcus agalactiae NOT DETECTED NOT DETECTED Final   Streptococcus pneumoniae NOT DETECTED NOT DETECTED Final   Streptococcus pyogenes NOT DETECTED NOT DETECTED Final   A.calcoaceticus-baumannii NOT DETECTED NOT DETECTED Final   Bacteroides fragilis NOT DETECTED NOT DETECTED Final   Enterobacterales NOT DETECTED NOT DETECTED Final   Enterobacter cloacae complex NOT DETECTED NOT DETECTED Final   Escherichia coli NOT DETECTED NOT DETECTED Final   Klebsiella aerogenes NOT DETECTED NOT DETECTED Final   Klebsiella oxytoca NOT DETECTED NOT DETECTED Final   Klebsiella pneumoniae NOT DETECTED NOT DETECTED Final   Proteus species NOT DETECTED NOT DETECTED Final   Salmonella species NOT DETECTED NOT DETECTED Final   Serratia marcescens NOT DETECTED NOT DETECTED Final   Haemophilus influenzae NOT DETECTED NOT DETECTED Final   Neisseria meningitidis NOT DETECTED NOT DETECTED Final   Pseudomonas aeruginosa NOT DETECTED NOT DETECTED Final   Stenotrophomonas maltophilia NOT DETECTED NOT DETECTED Final   Candida albicans NOT DETECTED NOT DETECTED Final   Candida auris NOT DETECTED NOT DETECTED Final   Candida glabrata NOT DETECTED NOT DETECTED Final   Candida krusei NOT DETECTED NOT DETECTED Final   Candida parapsilosis NOT DETECTED NOT DETECTED Final   Candida tropicalis NOT DETECTED NOT DETECTED Final   Cryptococcus neoformans/gattii NOT DETECTED NOT DETECTED Final   Meth resistant mecA/C and MREJ DETECTED (A) NOT DETECTED Final    Comment: CRITICAL RESULT CALLED TO, READ BACK BY AND VERIFIED WITH: M LILLISTON,PHARMD@0431  11/03/21 [place] Performed at  [facility], [address] [place], [place] [address]   Blood culture (routine x 2)     Status: Abnormal   Collection Time: 11/02/21 12:17 PM   Specimen: BLOOD  Result Value Ref Range Status   Specimen Description   Final    BLOOD LEFT ANTECUBITAL Performed at [facility] [address], [place], [place] [address]    Special Requests   Final    BOTTLES DRAWN AEROBIC AND ANAEROBIC Blood Culture adequate volume Performed at [facility] [address], [place], [place] [address]    Culture  Setup Time   Final    GRAM POSITIVE COCCI IN CLUSTERS IN BOTH AEROBIC AND ANAEROBIC BOTTLES CRITICAL VALUE NOTED.  VALUE IS CONSISTENT WITH PREVIOUSLY REPORTED AND CALLED VALUE.    Culture (A)  Final    STAPHYLOCOCCUS AUREUS SUSCEPTIBILITIES PERFORMED ON PREVIOUS CULTURE WITHIN THE LAST 5 DAYS. Performed at [facility], [place] [address], [place], [place] [address]    Report Status 11/05/2021 FINAL  Final  Resp Panel by RT-PCR (Flu A&B, Covid) Nasopharyngeal Swab     Status: None   Collection Time: 11/02/21  4:16 PM   Specimen: Nasopharyngeal Swab; Nasopharyngeal(NP) swabs in vial transport medium  Result Value Ref Range Status   SARS Coronavirus 2 by RT PCR NEGATIVE NEGATIVE Final    Comment: (NOTE) SARS-CoV-2 target nucleic acids are NOT DETECTED.  The SARS-CoV-2 RNA is generally detectable in upper respiratory specimens  during the acute phase of infection. The lowest concentration of SARS-CoV-2 viral copies this assay can detect is 138 copies/mL. A negative result does not preclude SARS-Cov-2 infection and should not be used as the sole basis for treatment or other patient management decisions. A negative result may occur with  improper specimen collection/handling, submission of specimen other than nasopharyngeal swab, presence of viral mutation(s) within the areas targeted by this assay, and inadequate number of viral copies(<138  copies/mL). A negative result must be combined with clinical observations, patient history, and epidemiological information. The expected result is Negative.  Fact Sheet for Patients:  EntrepreneurPulse.com.au  Fact Sheet for Healthcare Providers:  IncredibleEmployment.be  This test is no t yet approved or cleared by the [place] FDA and  has been authorized for detection and/or diagnosis of SARS-CoV-2 by FDA under an Emergency Use Authorization (EUA). This EUA will remain  in effect (meaning this test can be used) for the duration of the COVID-19 declaration under Section 564(b)(1) of the Act, 21 U.S.C.section 360bbb-3(b)(1), unless the authorization is terminated  or revoked sooner.       Influenza A by PCR NEGATIVE NEGATIVE Final   Influenza B by PCR NEGATIVE NEGATIVE Final    Comment: (NOTE) The Xpert Xpress SARS-CoV-2/FLU/RSV plus assay is intended as an aid in the diagnosis of influenza from Nasopharyngeal swab specimens and should not be used as a sole basis for treatment. Nasal washings and aspirates are unacceptable for Xpert Xpress SARS-CoV-2/FLU/RSV testing.  Fact Sheet for Patients: EntrepreneurPulse.com.au  Fact Sheet for Healthcare Providers: IncredibleEmployment.be  This test is not yet approved or cleared by the [place] FDA and has been authorized for detection and/or diagnosis of SARS-CoV-2 by FDA under an Emergency Use Authorization (EUA). This EUA will remain in effect (meaning this test can be used) for the duration of the COVID-19 declaration under Section 564(b)(1) of the Act, 21 U.S.C. section 360bbb-3(b)(1), unless the authorization is terminated or revoked.  Performed at [facility], [place] [address], [place], [place] [address]   Urine Culture     Status: Abnormal   Collection Time: 11/02/21  4:33 PM   Specimen: In/Out Cath Urine  Result Value  Ref Range Status   Specimen Description   Final    IN/OUT CATH URINE Performed at [facility] [address], [place], [place] [address]    Special Requests   Final    NONE Performed at [facility], [place] [address] [place], [place] [address]    Culture (A)  Final    >=100,000 COLONIES/mL METHICILLIN RESISTANT STAPHYLOCOCCUS AUREUS   Report Status 11/05/2021 FINAL  Final   Organism ID, Bacteria METHICILLIN RESISTANT STAPHYLOCOCCUS AUREUS (A)  Final      Susceptibility   Methicillin resistant staphylococcus aureus - MIC*    CIPROFLOXACIN >=8 RESISTANT Resistant     GENTAMICIN <=0.5 SENSITIVE Sensitive     NITROFURANTOIN <=16 SENSITIVE Sensitive     OXACILLIN >=4 RESISTANT Resistant     TETRACYCLINE <=1 SENSITIVE Sensitive     VANCOMYCIN 1 SENSITIVE Sensitive     TRIMETH/SULFA <=10 SENSITIVE Sensitive     CLINDAMYCIN <=0.25 SENSITIVE Sensitive     RIFAMPIN <=0.5 SENSITIVE Sensitive     Inducible Clindamycin NEGATIVE Sensitive     * >=100,000 COLONIES/mL METHICILLIN RESISTANT STAPHYLOCOCCUS AUREUS  Culture, blood (routine x 2)     Status: None (Preliminary result)   Collection Time: 11/04/21  5:19 AM   Specimen: BLOOD RIGHT HAND  Result Value Ref Range Status   Specimen Description   Final    BLOOD RIGHT HAND Performed at [facility], [place] [address], [place], [place] [address]    Special Requests   Final    BOTTLES DRAWN AEROBIC ONLY Blood Culture adequate volume Performed at [facility] [address] [place], [place] [address]    Culture   Final    NO GROWTH 3 DAYS Performed at [facility], [place] [address] [place], [place] [address]    Report Status PENDING  Incomplete  Culture, blood (routine x 2)     Status: None (Preliminary result)   Collection Time: 11/04/21  5:19 AM   Specimen: BLOOD LEFT HAND  Result Value Ref Range Status   Specimen Description   Final    BLOOD LEFT HAND Performed at  [facility], [place] [address], [place], [place] [address]    Special Requests   Final    BOTTLES DRAWN AEROBIC ONLY Blood Culture adequate volume Performed at [facility] [address] [place], [place] [address]    Culture   Final    NO GROWTH 3 DAYS Performed at [facility], [place] [address], [place], [place] [address]    Report Status PENDING  Incomplete  Culture, blood (routine x 2)     Status: None (Preliminary result)   Collection Time: 11/06/21  5:48 AM   Specimen: BLOOD RIGHT HAND  Result Value Ref Range Status   Specimen Description   Final    BLOOD RIGHT HAND Performed at [facility] [address] [place], [place] [address]    Special Requests   Final    BOTTLES DRAWN AEROBIC ONLY Blood Culture adequate volume Performed at [facility] [address] [place], [place] [address]    Culture   Final    NO GROWTH 1 DAY Performed at [facility], [place] [address] [place], [place] [address]    Report Status PENDING  Incomplete  Culture, blood (routine x 2)     Status: Abnormal (Preliminary result)   Collection Time: 11/06/21  5:52 AM   Specimen: BLOOD LEFT HAND  Result Value Ref Range Status   Specimen Description   Final    BLOOD LEFT HAND Performed at [facility] [address] [place], [place] [address]    Special Requests   Final    BOTTLES DRAWN AEROBIC AND ANAEROBIC Blood Culture adequate volume Performed at [facility] [address] [place], [place] [address]    Culture  Setup Time   Final    GRAM POSITIVE COCCI ANAEROBIC BOTTLE ONLY CRITICAL VALUE NOTED.  VALUE IS CONSISTENT WITH PREVIOUSLY REPORTED AND CALLED VALUE.    Culture (A)  Final    STAPHYLOCOCCUS AUREUS CULTURE REINCUBATED FOR BETTER GROWTH Performed at [facility], [place] [address] [place], [place] [address]    Report Status PENDING  Incomplete     RN Pressure  Injury Documentation:     Estimated body mass index is 33.91 kg/m as calculated from the following:   Height as of this encounter: 6' (1.829 m).   Weight as of this encounter: 113.4 kg.  Malnutrition Type:   Malnutrition Characteristics:   Nutrition Interventions:     Radiology Studies: No results found.  Scheduled Meds:  albuterol  2.5 mg Nebulization TID   amLODipine  5 mg Oral Daily  atorvastatin  20 mg Oral Daily   enoxaparin (LOVENOX) injection  60 mg Subcutaneous Q24H   losartan  50 mg Oral Daily   mometasone-formoterol  2 puff Inhalation BID   QUEtiapine  25 mg Oral QHS   sodium chloride flush  3 mL Intravenous Q12H   Continuous Infusions:  sodium chloride 10 mL/hr at 11/03/21 1032   vancomycin 1,500 mg (11/07/21 0958)    LOS: 5 days    Kerney Elbe, DO [facility] PAGER is on [place]  If 7PM-7AM, please contact night-coverage [email or web address]

## 2021-11-08 DIAGNOSIS — A4102 Sepsis due to Methicillin resistant Staphylococcus aureus: Secondary | ICD-10-CM | POA: Diagnosis not present

## 2021-11-08 DIAGNOSIS — J45909 Unspecified asthma, uncomplicated: Secondary | ICD-10-CM | POA: Diagnosis not present

## 2021-11-08 DIAGNOSIS — J9601 Acute respiratory failure with hypoxia: Secondary | ICD-10-CM | POA: Diagnosis not present

## 2021-11-08 DIAGNOSIS — E785 Hyperlipidemia, unspecified: Secondary | ICD-10-CM

## 2021-11-08 LAB — CULTURE, BLOOD (ROUTINE X 2): Special Requests: ADEQUATE

## 2021-11-08 LAB — CBC WITH DIFFERENTIAL/PLATELET
Abs Immature Granulocytes: 0.23 10*3/uL — ABNORMAL HIGH (ref 0.00–0.07)
Basophils Absolute: 0 10*3/uL (ref 0.0–0.1)
Basophils Relative: 0 %
Eosinophils Absolute: 0.1 10*3/uL (ref 0.0–0.5)
Eosinophils Relative: 0 %
HCT: 33.3 % — ABNORMAL LOW (ref 39.0–52.0)
Hemoglobin: 11.2 g/dL — ABNORMAL LOW (ref 13.0–17.0)
Immature Granulocytes: 2 %
Lymphocytes Relative: 6 %
Lymphs Abs: 0.8 10*3/uL (ref 0.7–4.0)
MCH: 30.4 pg (ref 26.0–34.0)
MCHC: 33.6 g/dL (ref 30.0–36.0)
MCV: 90.2 fL (ref 80.0–100.0)
Monocytes Absolute: 0.9 10*3/uL (ref 0.1–1.0)
Monocytes Relative: 6 %
Neutro Abs: 12.4 10*3/uL — ABNORMAL HIGH (ref 1.7–7.7)
Neutrophils Relative %: 86 %
Platelets: 229 10*3/uL (ref 150–400)
RBC: 3.69 MIL/uL — ABNORMAL LOW (ref 4.22–5.81)
RDW: 15.1 % (ref 11.5–15.5)
WBC: 14.3 10*3/uL — ABNORMAL HIGH (ref 4.0–10.5)
nRBC: 0 % (ref 0.0–0.2)

## 2021-11-08 LAB — COMPREHENSIVE METABOLIC PANEL
ALT: 32 U/L (ref 0–44)
AST: 33 U/L (ref 15–41)
Albumin: 2.6 g/dL — ABNORMAL LOW (ref 3.5–5.0)
Alkaline Phosphatase: 68 U/L (ref 38–126)
Anion gap: 12 (ref 5–15)
BUN: 24 mg/dL — ABNORMAL HIGH (ref 8–23)
CO2: 24 mmol/L (ref 22–32)
Calcium: 8.8 mg/dL — ABNORMAL LOW (ref 8.9–10.3)
Chloride: 98 mmol/L (ref 98–111)
Creatinine, Ser: 0.63 mg/dL (ref 0.61–1.24)
GFR, Estimated: >60 mL/min (ref >60)
Glucose, Bld: 125 mg/dL — ABNORMAL HIGH (ref 70–99)
Potassium: 3.9 mmol/L (ref 3.5–5.1)
Sodium: 134 mmol/L — ABNORMAL LOW (ref 135–145)
Total Bilirubin: 2.4 mg/dL — ABNORMAL HIGH (ref 0.3–1.2)
Total Protein: 7.4 g/dL (ref 6.5–8.1)

## 2021-11-08 LAB — MIC RESULT

## 2021-11-08 LAB — MINIMUM INHIBITORY CONC. (1 DRUG)

## 2021-11-08 LAB — PHOSPHORUS: Phosphorus: 3.2 mg/dL (ref 2.5–4.6)

## 2021-11-08 LAB — MAGNESIUM: Magnesium: 2.1 mg/dL (ref 1.7–2.4)

## 2021-11-08 NOTE — Progress Notes (Signed)
PROGRESS NOTE    Charles Weiss.  FKC:[id number] DOB: 04-Mar-1951 DOA: 11/02/2021 PCP: System, Provider Not In  Brief Narrative:  The patient is a 70 year old Caucasian male with a past medical history significant for but limited to asthma, hypertension as well as other comorbidities with recent admission for influenza pneumonia and respiratory failure who came back to the emergency room with neck pain and headache.  He is noted to have a high fever and upon further work-up he is found to have evidence of discitis in the lumbar vertebra.  Blood cultures were positive for MRSA.  He was started on vancomycin and ID was consulted.  He is undergoing further work-up and his mental status is a little off and he is agitated.  MRI was unrevealing.  ID recommending repeating MRI later this week if mentation has not improved.  Continues to be extremely confused and is in soft wrist restraints given that he is a danger to himself given his mentation but was a little better so restraints were removed.  Continues to have significant right-sided abdominal pain but CT scan of the abdomen pelvis done as below.  Of note he does have some bruising on his abdomen on the right side likely from his enoxaparin injection  Assessment & Plan:   Principal Problem:   Sepsis ([place]) Active Problems:   Hypertension   Hyperlipidemia   Asthma   Acute respiratory failure with hypoxia (HCC)   Vertebral osteomyelitis (HCC)   MRSA bacteremia   Retropharyngeal abscess   Right lower quadrant abdominal pain  Sepsis secondary to MRSA bacteremia -Noted to be febrile with T-max of 103.4, tachycardic heart rate in the 110s, tachypneic source of infection is discitis.  Evidence of endorgan damage includes encephalopathy related to sepsis. Now Still febrile an had a Temperature of 100.8 this AM  -Infectious disease following and recommending continuing daily evaluation for CNS symptoms and repeat MRI at the end of the week  if persistent or worsening headache or other CNS symptoms -Currently on vancomycin and ID is following and they are recommending continue vancomycin for now but will consider changing antibiotics to daptomycin/ceftaroline if his repeat blood cultures on 1118 podiatry come back positive -Repeat blood culture 11/04/21 Negative but 11/06/21 still persistently Positive  -IV fluids have now stopped -Repeated blood cultures 11/07/2021 and still pending -Will eventually need PICC line once cultures have cleared -ID is recommending obtaining repeat blood cultures today -2D echocardiogram performed that was unremarkable but will need TEE given persistent bacteremia will obtain TEE and will be done Tuesday 11/11/2021 -PT OT recommending SNF once medically stable to be discharged   Discitis/vertebral osteomyelitis -Noted to have significant discitis and lumbar vertebral/sacrum -Also noted to have significant epidural enhancement -Continue on IV antibiotics total of 8 weeks -Discussed with infectious disease/neurology was not felt that lumbar puncture would change management at this point -Checking ECHOCardiogram as above -WBC has gone from 24.0 -> 21.4 -> 16.9 -> 13.7 and is now worsened to 14.3 -Repeat blood cultures x2 from 01/04/2021 showed no growth to date at 2 days and they are being repeated again on 11/06/2021 for positive and repeated blood cultures on 11/07/2021 and are still pending   Abdominal pain -Noted to have diffuse abdominal tenderness -Unclear if this is contributing to underlying sepsis -Check CT abdomen and showed "Presumably degenerative endplate changes at V4-B4 and L4-L5. There are mild inflammatory changes and small lymph nodes in the anterior paravertebral soft tissues at this level. If there is  high clinical concern for infection, recommend MRI.  Gallbladder sludge. Colonic diverticulosis without evidence for acute diverticulitis." -ID is continuing IV vancomycin and  recommending follow-up with daptomycin sensitivities -Abdominal pain could be from his bruising from his enoxaparin injection   Acute Encephalopathy with Agitation, improving -Related to sepsis and likely infection -He is intermittently agitated and confused and combative -Discontinued lorazepam start the patient on Haldol 2 mg every 6 as needed -Place on Delirium Precautions -MRI Brain done and showed "Severely motion degraded examination without evidence of acute intracranial abnormality. Mild-to-moderate chronic small vessel ischemic disease."  -Continue to monitor -Added 25 mg of quetiapine p.o. nightly and this is improved his mentation -Could be related to pain as his mentation improved with better pain control -Patient is currently in wrist restraints and will -ID recommending repeating MRI at the end of this week if he continues with persistent worsening headache or other CNS symptoms -We will place on delirium precautions   Hypertension -Currently on amlodipine and losartan and being continued on both of those with amlodipine 5 mg p.o. daily and losartan 50 mg p.o. daily -Holding hydrochlorothiazide since it is likely following completed -Blood pressure likely further elevated due to pain -Continue to Monitor BP per Protocol -Last BP reading was 160/87   Hyperlipidemia -Continued on Atorvastatin 20 g p.o. daily   Sore throat -Noted on CT scan to have retropharyngeal erythema.  No comment of obvious abscess no CT -EDP discussed with ENT who recommended conservative management with IV antibiotics -Was also recommended that he receive Decadron 8 mg every 8 hours x 3 doses; has completed his Decadron doses -If symptoms worsen over the next 24 to 48 hours, recommendations are for reimaging but did not complain of Sore Throat today  -Continue to Monitor closely    Asthma -Currently no wheezing -Continue on bronchodilators, Dulera -Continue to Monitor Respiratory Status  carefully -SpO2: 96 % O2 Flow Rate (L/min): 2 L/min  Hyponatremia, improving -Patient's sodium went from 131 -> 130 -> 131 x2 and today was 134 -Continued with IV fluid hydration as above -Repeat CMP in the a.m.  Normocytic Anemia -Patient's hemoglobin/hematocrit went from 11.2/32.2 -> 11.9/34.0 -> 12.7/36.8 -> 10.7/31.0 and today was 11.2/33.3 -Check anemia panel in the a.m. -Continue to monitor for signs and symptoms of bleeding; currently no overt bleeding noted -Repeat CBC in a.m.  Obesity -Complicates overall prognosis and care -Estimated body mass index is 33.91 kg/m as calculated from the following:   Height as of this encounter: 6' (1.829 m).   Weight as of this encounter: 113.4 kg. -Weight loss and Dietary Counseling given   DVT prophylaxis: Enoxaparin Code Status: DO NOT RESUSCITATE Family Communication: No family currently at bedside but I spoke with his Sister over the phone yesterday  Disposition Plan: Pending further clinical improvement and clearance by ID and further workup for his MRSA bacteremia  Status is: Inpatient  Remains inpatient appropriate because: Remains somewhat confused and encephalopathic we will need ID clearance and antibiotic recommendations prior to safe discharge disposition   Consultants:  Infectious Diseases  Procedures:  ECHOCARDIOGRAM IMPRESSIONS     1. Left ventricular ejection fraction, by estimation, is 55 to 60%. The  left ventricle has normal function. The left ventricle has no regional  wall motion abnormalities. There is mild concentric left ventricular  hypertrophy. Left ventricular diastolic  parameters are consistent with Grade I diastolic dysfunction (impaired  relaxation).   2. Right ventricular systolic function is normal. The right ventricular  size  is normal.   3. The mitral valve is normal in structure. No evidence of mitral valve  regurgitation. No evidence of mitral stenosis.   4. The aortic valve is normal  in structure. Aortic valve regurgitation is  not visualized. No aortic stenosis is present.   5. There is borderline dilatation of the ascending aorta, measuring 36  mm.   6. The inferior vena cava is normal in size with greater than 50%  respiratory variability, suggesting right atrial pressure of 3 mmHg.   Comparison(s): No prior Echocardiogram.   FINDINGS   Left Ventricle: Left ventricular ejection fraction, by estimation, is 55  to 60%. The left ventricle has normal function. The left ventricle has no  regional wall motion abnormalities. Definity contrast agent was given IV  to delineate the left ventricular   endocardial borders. The left ventricular internal cavity size was normal  in size. There is mild concentric left ventricular hypertrophy. Left  ventricular diastolic parameters are consistent with Grade I diastolic  dysfunction (impaired relaxation).   Right Ventricle: The right ventricular size is normal. No increase in  right ventricular wall thickness. Right ventricular systolic function is  normal.   Left Atrium: Left atrial size was normal in size.   Right Atrium: Right atrial size was normal in size.   Pericardium: There is no evidence of pericardial effusion. Presence of  epicardial fat layer.   Mitral Valve: The mitral valve is normal in structure. No evidence of  mitral valve regurgitation. No evidence of mitral valve stenosis. MV peak  gradient, 11.7 mmHg. The mean mitral valve gradient is 5.0 mmHg.   Tricuspid Valve: The tricuspid valve is normal in structure. Tricuspid  valve regurgitation is not demonstrated. No evidence of tricuspid  stenosis.   Aortic Valve: The aortic valve is normal in structure. Aortic valve  regurgitation is not visualized. No aortic stenosis is present.   Pulmonic Valve: The pulmonic valve was normal in structure. Pulmonic valve  regurgitation is not visualized. No evidence of pulmonic stenosis.   Aorta: There is borderline  dilatation of the ascending aorta, measuring 36  mm.   Venous: The inferior vena cava is normal in size with greater than 50%  respiratory variability, suggesting right atrial pressure of 3 mmHg.   IAS/Shunts: No atrial level shunt detected by color flow Doppler.      LEFT VENTRICLE  PLAX 2D  LVIDd:         4.50 cm      Diastology  LVIDs:         3.60 cm      LV e' medial:    19.00 cm/s  LV PW:         1.07 cm      LV E/e' medial:  8.2  LV IVS:        1.04 cm      LV e' lateral:   17.90 cm/s  LVOT diam:     2.10 cm      LV E/e' lateral: 8.7  LV SV:         95  LV SV Index:   40  LVOT Area:     3.46 cm     LV Volumes (MOD)  LV vol d, MOD A2C: 104.2 ml  LV vol d, MOD A4C: 112.0 ml  LV vol s, MOD A2C: 45.8 ml  LV vol s, MOD A4C: 54.9 ml  LV SV MOD A2C:     58.3 ml  LV SV MOD  A4C:     112.0 ml  LV SV MOD BP:      57.0 ml   RIGHT VENTRICLE            IVC  RV S prime:     9.36 cm/s  IVC diam: 2.60 cm  TAPSE (M-mode): 1.4 cm   LEFT ATRIUM             Index        RIGHT ATRIUM           Index  LA diam:        3.20 cm 1.37 cm/m   RA Area:     11.50 cm  LA Vol (A2C):   73.4 ml 31.33 ml/m  RA Volume:   24.70 ml  10.54 ml/m  LA Vol (A4C):   26.5 ml 11.31 ml/m  LA Biplane Vol: 45.0 ml 19.21 ml/m   AORTIC VALVE  LVOT Vmax:   141.00 cm/s  LVOT Vmean:  94.600 cm/s  LVOT VTI:    0.273 m     AORTA  Ao Root diam: 3.80 cm  Ao Asc diam:  3.60 cm   MITRAL VALVE  MV Area (PHT): 3.60 cm     SHUNTS  MV Area VTI:   3.25 cm     Systemic VTI:  0.27 m  MV Peak grad:  11.7 mmHg    Systemic Diam: 2.10 cm  MV Mean grad:  5.0 mmHg  MV Vmax:       1.71 m/s  MV Vmean:      101.0 cm/s  MV Decel Time: 211 msec  MV E velocity: 155.00 cm/s   Antimicrobials:  Anti-infectives (From admission, onward)    Start     Dose/Rate Route Frequency Ordered Stop   11/06/21 2200  vancomycin (VANCOREADY) IVPB 1500 mg/300 mL        1,500 mg 150 mL/hr over 120 Minutes Intravenous Every 12 hours  11/06/21 1223     11/03/21 1400  ceFEPIme (MAXIPIME) 2 g in sodium chloride 0.9 % 100 mL IVPB  Status:  Discontinued        2 g 200 mL/hr over 30 Minutes Intravenous Every 8 hours 11/03/21 1048 11/03/21 1119   11/03/21 1200  ampicillin (OMNIPEN) 1 g in sodium chloride 0.9 % 100 mL IVPB  Status:  Discontinued        1 g 300 mL/hr over 20 Minutes Intravenous Every 6 hours 11/03/21 1041 11/03/21 1119   11/03/21 0600  metroNIDAZOLE (FLAGYL) IVPB 500 mg  Status:  Discontinued        500 mg 100 mL/hr over 60 Minutes Intravenous Every 12 hours 11/02/21 2031 11/02/21 2248   11/03/21 0200  ceFEPIme (MAXIPIME) 2 g in sodium chloride 0.9 % 100 mL IVPB  Status:  Discontinued        2 g 200 mL/hr over 30 Minutes Intravenous Every 8 hours 11/02/21 2101 11/03/21 0445   11/03/21 0100  acyclovir (ZOVIRAX) 775 mg in dextrose 5 % 100 mL IVPB  Status:  Discontinued        10 mg/kg  77.6 kg (Ideal) 115.5 mL/hr over 60 Minutes Intravenous Every 8 hours 11/02/21 2308 11/03/21 0445   11/03/21 0000  ampicillin (OMNIPEN) 2 g in sodium chloride 0.9 % 100 mL IVPB  Status:  Discontinued        2 g 300 mL/hr over 20 Minutes Intravenous Every 4 hours 11/02/21 2308 11/03/21 0445   11/02/21 2200  vancomycin (VANCOREADY) IVPB  1000 mg/200 mL  Status:  Discontinued        1,000 mg 200 mL/hr over 60 Minutes Intravenous Every 12 hours 11/02/21 2101 11/06/21 1223   11/02/21 1700  ceFEPIme (MAXIPIME) 2 g in sodium chloride 0.9 % 100 mL IVPB        2 g 200 mL/hr over 30 Minutes Intravenous  Once 11/02/21 1646 11/02/21 1805   11/02/21 1700  metroNIDAZOLE (FLAGYL) IVPB 500 mg        500 mg 100 mL/hr over 60 Minutes Intravenous  Once 11/02/21 1646 11/02/21 2015   11/02/21 1700  vancomycin (VANCOCIN) IVPB 1000 mg/200 mL premix        1,000 mg 200 mL/hr over 60 Minutes Intravenous  Once 11/02/21 1646 11/02/21 2130        Subjective: Seen and examined at bedside and was more awake this morning.  Sitting in the chair at  bedside.  Denies any lightheadedness or dizziness.  Continues to have some abdominal pain.  Feels okay.  No other concerns or complaints at this time.  Objective: Vitals:   11/07/21 2111 11/08/21 0405 11/08/21 0835 11/08/21 0836  BP: 129/82 (!) 160/87    Pulse: 100 (!) 110    Resp: 20 16    Temp: 98.8 F (37.1 C) 99.8 F (37.7 C)    TempSrc: Oral Oral    SpO2: 100% 96% 96% 96%  Weight:      Height:        Intake/Output Summary (Last 24 hours) at 11/08/2021 1312 Last data filed at 11/07/2021 1800 Gross per 24 hour  Intake 240 ml  Output 250 ml  Net -10 ml    Filed Weights   11/02/21 0701  Weight: 113.4 kg   Examination: Physical Exam:  Constitutional: WN/WD obese Caucasian male who is sitting in the chair at bedside much more awake and alert  Eyes: Lids and conjunctivae normal, sclerae anicteric  ENMT: External Ears, Nose appear normal. Grossly normal hearing. Mucous membranes are moist.  Neck: Appears normal, supple, no cervical masses, normal ROM, no appreciable thyromegaly; no appreciable JVD Respiratory: Diminished to auscultation bilaterally, no wheezing, rales, rhonchi or crackles. Normal respiratory effort and patient is not tachypenic. No accessory muscle use.  Unlabored breathing Cardiovascular: RRR, no murmurs / rubs / gallops. S1 and S2 auscultated. No extremity edema.  Abdomen: Soft, non-tender, non-distended. Bowel sounds positive.  GU: Deferred. Musculoskeletal: No clubbing / cyanosis of digits/nails. No joint deformity upper and lower extremities.  Skin: No rashes, lesions, ulcers on limited skin evaluation. No induration; Warm and dry.  Neurologic: CN 2-12 grossly intact with no focal deficits. Romberg sign and cerebellar reflexes not assessed.  Psychiatric: Normal judgment and insight. Alert and oriented x 2. Normal mood and appropriate affect.   Data Reviewed: I have personally reviewed following labs and imaging studies  CBC: Recent Labs  Lab  11/02/21 0859 11/03/21 0440 11/04/21 0435 11/05/21 0441 11/06/21 0100 11/07/21 0419 11/08/21 0451  WBC 19.4*   < > 24.0* 21.4* 16.9* 13.7* 14.3*  NEUTROABS 17.4*  --   --   --  14.3* 11.4* 12.4*  HGB 13.1   < > 11.2* 11.9* 12.7* 10.7* 11.2*  HCT 37.4*   < > 32.2* 34.0* 36.8* 31.0* 33.3*  MCV 89.3   < > 89.4 87.4 87.4 88.6 90.2  PLT 292   < > 231 243 218 198 229   < > = values in this interval not displayed.    Basic Metabolic Panel:  Recent Labs  Lab 11/04/21 0435 11/05/21 0441 11/06/21 0100 11/07/21 0419 11/08/21 0451  NA 131* 130* 131* 131* 134*  K 3.9 4.5 3.9 3.7 3.9  CL 97* 95* 94* 99 98  CO2 25 26 24 24 24   GLUCOSE 175* 137* 110* 136* 125*  BUN 26* 22 24* 27* 24*  CREATININE 0.76 0.72 0.76 0.75 0.63  CALCIUM 8.7* 8.7* 9.0 8.3* 8.8*  MG  --   --  1.8 2.3 2.1  PHOS  --   --  3.4 3.9 3.2    GFR: Estimated Creatinine Clearance: 111.7 mL/min (by C-G formula based on SCr of 0.63 mg/dL). Liver Function Tests: Recent Labs  Lab 11/02/21 1028 11/03/21 0440 11/06/21 0100 11/07/21 0419 11/08/21 0451  AST 20 19 23 20  33  ALT 34 30 27 26  32  ALKPHOS 66 68 68 54 68  BILITOT 0.8 1.1 2.4* 2.5* 2.4*  PROT 7.7 6.7 7.7 6.5 7.4  ALBUMIN 3.2* 2.5* 2.7* 2.3* 2.6*    No results for input(s): LIPASE, AMYLASE in the last 168 hours. No results for input(s): AMMONIA in the last 168 hours. Coagulation Profile: Recent Labs  Lab 11/02/21 1642  INR 1.3*    Cardiac Enzymes: No results for input(s): CKTOTAL, CKMB, CKMBINDEX, TROPONINI in the last 168 hours. BNP (last 3 results) No results for input(s): PROBNP in the last 8760 hours. HbA1C: No results for input(s): HGBA1C in the last 72 hours. CBG: Recent Labs  Lab 11/05/21 1626  GLUCAP 133*    Lipid Profile: No results for input(s): CHOL, HDL, LDLCALC, TRIG, CHOLHDL, LDLDIRECT in the last 72 hours. Thyroid Function Tests: No results for input(s): TSH, T4TOTAL, FREET4, T3FREE, THYROIDAB in the last 72 hours. Anemia  Panel: No results for input(s): VITAMINB12, FOLATE, FERRITIN, TIBC, IRON, RETICCTPCT in the last 72 hours. Sepsis Labs: Recent Labs  Lab 11/02/21 1632 11/03/21 0440  PROCALCITON  --  2.69  LATICACIDVEN 1.2  --      Recent Results (from the past 240 hour(s))  Blood culture (routine x 2)     Status: Abnormal (Preliminary result)   Collection Time: 11/02/21 12:12 PM   Specimen: BLOOD  Result Value Ref Range Status   Specimen Description   Final    BLOOD RIGHT ANTECUBITAL Performed at [facility], [place] [address], [place], [place] [address]    Special Requests   Final    BOTTLES DRAWN AEROBIC AND ANAEROBIC Blood Culture results may not be optimal due to an excessive volume of blood received in culture bottles Performed at [facility] [address], [place], [place] [address]    Culture  Setup Time   Final    GRAM POSITIVE COCCI IN CLUSTERS IN BOTH AEROBIC AND ANAEROBIC BOTTLES CRITICAL RESULT CALLED TO, READ BACK BY AND VERIFIED WITH: M LILLISTON,PHARMD@0432  11/03/21 [place]    Culture (A)  Final    METHICILLIN RESISTANT STAPHYLOCOCCUS AUREUS Sent to [place] for further susceptibility testing. Performed at [facility], [place] [address] [place], [place] [address]    Report Status PENDING  Incomplete   Organism ID, Bacteria METHICILLIN RESISTANT STAPHYLOCOCCUS AUREUS  Final      Susceptibility   Methicillin resistant staphylococcus aureus - MIC*    CIPROFLOXACIN >=8 RESISTANT Resistant     ERYTHROMYCIN >=8 RESISTANT Resistant     GENTAMICIN <=0.5 SENSITIVE Sensitive     OXACILLIN >=4 RESISTANT Resistant     TETRACYCLINE <=1 SENSITIVE Sensitive     VANCOMYCIN 1 SENSITIVE  Sensitive     TRIMETH/SULFA <=10 SENSITIVE Sensitive     CLINDAMYCIN <=0.25 SENSITIVE Sensitive     RIFAMPIN <=0.5 SENSITIVE Sensitive     Inducible Clindamycin NEGATIVE Sensitive     * METHICILLIN RESISTANT STAPHYLOCOCCUS AUREUS  Blood Culture ID Panel (Reflexed)      Status: Abnormal   Collection Time: 11/02/21 12:12 PM  Result Value Ref Range Status   Enterococcus faecalis NOT DETECTED NOT DETECTED Final   Enterococcus Faecium NOT DETECTED NOT DETECTED Final   Listeria monocytogenes NOT DETECTED NOT DETECTED Final   Staphylococcus species DETECTED (A) NOT DETECTED Final    Comment: CRITICAL RESULT CALLED TO, READ BACK BY AND VERIFIED WITH: M LILLISTON,PHARMD@0431  11/03/21 [place]    Staphylococcus aureus (BCID) DETECTED (A) NOT DETECTED Final    Comment: Methicillin (oxacillin)-resistant Staphylococcus aureus (MRSA). MRSA is predictably resistant to beta-lactam antibiotics (except ceftaroline). Preferred therapy is vancomycin unless clinically contraindicated. Patient requires contact precautions if  hospitalized. CRITICAL RESULT CALLED TO, READ BACK BY AND VERIFIED WITH: M LILLISTON,PHARMD@0431  11/03/21 [place]    Staphylococcus epidermidis NOT DETECTED NOT DETECTED Final   Staphylococcus lugdunensis NOT DETECTED NOT DETECTED Final   Streptococcus species NOT DETECTED NOT DETECTED Final   Streptococcus agalactiae NOT DETECTED NOT DETECTED Final   Streptococcus pneumoniae NOT DETECTED NOT DETECTED Final   Streptococcus pyogenes NOT DETECTED NOT DETECTED Final   A.calcoaceticus-baumannii NOT DETECTED NOT DETECTED Final   Bacteroides fragilis NOT DETECTED NOT DETECTED Final   Enterobacterales NOT DETECTED NOT DETECTED Final   Enterobacter cloacae complex NOT DETECTED NOT DETECTED Final   Escherichia coli NOT DETECTED NOT DETECTED Final   Klebsiella aerogenes NOT DETECTED NOT DETECTED Final   Klebsiella oxytoca NOT DETECTED NOT DETECTED Final   Klebsiella pneumoniae NOT DETECTED NOT DETECTED Final   Proteus species NOT DETECTED NOT DETECTED Final   Salmonella species NOT DETECTED NOT DETECTED Final   Serratia marcescens NOT DETECTED NOT DETECTED Final   Haemophilus influenzae NOT DETECTED NOT DETECTED Final   Neisseria meningitidis NOT DETECTED NOT  DETECTED Final   Pseudomonas aeruginosa NOT DETECTED NOT DETECTED Final   Stenotrophomonas maltophilia NOT DETECTED NOT DETECTED Final   Candida albicans NOT DETECTED NOT DETECTED Final   Candida auris NOT DETECTED NOT DETECTED Final   Candida glabrata NOT DETECTED NOT DETECTED Final   Candida krusei NOT DETECTED NOT DETECTED Final   Candida parapsilosis NOT DETECTED NOT DETECTED Final   Candida tropicalis NOT DETECTED NOT DETECTED Final   Cryptococcus neoformans/gattii NOT DETECTED NOT DETECTED Final   Meth resistant mecA/C and MREJ DETECTED (A) NOT DETECTED Final    Comment: CRITICAL RESULT CALLED TO, READ BACK BY AND VERIFIED WITH: M LILLISTON,PHARMD@0431  11/03/21 [place] Performed at [facility], [address] [place], [place] [address]   Blood culture (routine x 2)     Status: Abnormal   Collection Time: 11/02/21 12:17 PM   Specimen: BLOOD  Result Value Ref Range Status   Specimen Description   Final    BLOOD LEFT ANTECUBITAL Performed at [facility] [address], [place], [place] [address]    Special Requests   Final    BOTTLES DRAWN AEROBIC AND ANAEROBIC Blood Culture adequate volume Performed at [name] [address] [place], [place] [address]    Culture  Setup Time   Final    GRAM POSITIVE COCCI IN CLUSTERS IN BOTH AEROBIC AND ANAEROBIC BOTTLES CRITICAL VALUE NOTED.  VALUE IS CONSISTENT WITH PREVIOUSLY REPORTED AND CALLED  VALUE.    Culture (A)  Final    STAPHYLOCOCCUS AUREUS SUSCEPTIBILITIES PERFORMED ON PREVIOUS CULTURE WITHIN THE LAST 5 DAYS. Performed at [facility], [place] [address] [place], [place] [address]    Report Status 11/05/2021 FINAL  Final  Resp Panel by RT-PCR (Flu A&B, Covid) Nasopharyngeal Swab     Status: None   Collection Time: 11/02/21  4:16 PM   Specimen: Nasopharyngeal Swab; Nasopharyngeal(NP) swabs in vial transport medium  Result Value Ref Range Status   SARS Coronavirus 2 by RT PCR  NEGATIVE NEGATIVE Final    Comment: (NOTE) SARS-CoV-2 target nucleic acids are NOT DETECTED.  The SARS-CoV-2 RNA is generally detectable in upper respiratory specimens during the acute phase of infection. The lowest concentration of SARS-CoV-2 viral copies this assay can detect is 138 copies/mL. A negative result does not preclude SARS-Cov-2 infection and should not be used as the sole basis for treatment or other patient management decisions. A negative result may occur with  improper specimen collection/handling, submission of specimen other than nasopharyngeal swab, presence of viral mutation(s) within the areas targeted by this assay, and inadequate number of viral copies(<138 copies/mL). A negative result must be combined with clinical observations, patient history, and epidemiological information. The expected result is Negative.  Fact Sheet for Patients:  EntrepreneurPulse.com.au  Fact Sheet for Healthcare Providers:  IncredibleEmployment.be  This test is no t yet approved or cleared by the [place] FDA and  has been authorized for detection and/or diagnosis of SARS-CoV-2 by FDA under an Emergency Use Authorization (EUA). This EUA will remain  in effect (meaning this test can be used) for the duration of the COVID-19 declaration under Section 564(b)(1) of the Act, 21 U.S.C.section 360bbb-3(b)(1), unless the authorization is terminated  or revoked sooner.       Influenza A by PCR NEGATIVE NEGATIVE Final   Influenza B by PCR NEGATIVE NEGATIVE Final    Comment: (NOTE) The Xpert Xpress SARS-CoV-2/FLU/RSV plus assay is intended as an aid in the diagnosis of influenza from Nasopharyngeal swab specimens and should not be used as a sole basis for treatment. Nasal washings and aspirates are unacceptable for Xpert Xpress SARS-CoV-2/FLU/RSV testing.  Fact Sheet for Patients: EntrepreneurPulse.com.au  Fact Sheet for  Healthcare Providers: IncredibleEmployment.be  This test is not yet approved or cleared by the [place] FDA and has been authorized for detection and/or diagnosis of SARS-CoV-2 by FDA under an Emergency Use Authorization (EUA). This EUA will remain in effect (meaning this test can be used) for the duration of the COVID-19 declaration under Section 564(b)(1) of the Act, 21 U.S.C. section 360bbb-3(b)(1), unless the authorization is terminated or revoked.  Performed at [facility], [place] [address] [place], [place] [address]   Urine Culture     Status: Abnormal   Collection Time: 11/02/21  4:33 PM   Specimen: In/Out Cath Urine  Result Value Ref Range Status   Specimen Description   Final    IN/OUT CATH URINE Performed at [place] [name] [place], [place] [address]    Special Requests   Final    NONE Performed at [facility], [place] [address], [place], [place] [address]    Culture (A)  Final    >=100,000 COLONIES/mL METHICILLIN RESISTANT STAPHYLOCOCCUS AUREUS   Report Status 11/05/2021 FINAL  Final   Organism ID, Bacteria METHICILLIN RESISTANT STAPHYLOCOCCUS AUREUS (A)  Final      Susceptibility   Methicillin resistant staphylococcus aureus -  MIC*    CIPROFLOXACIN >=8 RESISTANT Resistant     GENTAMICIN <=0.5 SENSITIVE Sensitive     NITROFURANTOIN <=16 SENSITIVE Sensitive     OXACILLIN >=4 RESISTANT Resistant     TETRACYCLINE <=1 SENSITIVE Sensitive     VANCOMYCIN 1 SENSITIVE Sensitive     TRIMETH/SULFA <=10 SENSITIVE Sensitive     CLINDAMYCIN <=0.25 SENSITIVE Sensitive     RIFAMPIN <=0.5 SENSITIVE Sensitive     Inducible Clindamycin NEGATIVE Sensitive     * >=100,000 COLONIES/mL METHICILLIN RESISTANT STAPHYLOCOCCUS AUREUS  Culture, blood (routine x 2)     Status: None (Preliminary result)   Collection Time: 11/04/21  5:19 AM   Specimen: BLOOD RIGHT HAND  Result Value Ref Range Status    Specimen Description   Final    BLOOD RIGHT HAND Performed at [facility], [place] [address], [place], [place] [address]    Special Requests   Final    BOTTLES DRAWN AEROBIC ONLY Blood Culture adequate volume Performed at [place] [address], [place], [place] [address]    Culture   Final    NO GROWTH 3 DAYS Performed at [facility], [place] [address], [place], [place] [address]    Report Status PENDING  Incomplete  Culture, blood (routine x 2)     Status: None (Preliminary result)   Collection Time: 11/04/21  5:19 AM   Specimen: BLOOD LEFT HAND  Result Value Ref Range Status   Specimen Description   Final    BLOOD LEFT HAND Performed at [facility], [place] [address], [place], [place] [address]    Special Requests   Final    BOTTLES DRAWN AEROBIC ONLY Blood Culture adequate volume Performed at [facility] [address], [place], [place] [address]    Culture   Final    NO GROWTH 3 DAYS Performed at [facility], [place] [address] [place], [place] [address]    Report Status PENDING  Incomplete  Culture, blood (routine x 2)     Status: None (Preliminary result)   Collection Time: 11/06/21  5:48 AM   Specimen: BLOOD RIGHT HAND  Result Value Ref Range Status   Specimen Description   Final    BLOOD RIGHT HAND Performed at [place] [address] [place], [place] [address]    Special Requests   Final    BOTTLES DRAWN AEROBIC ONLY Blood Culture adequate volume Performed at [place] [address], [place], [place] [address]    Culture   Final    NO GROWTH 1 DAY Performed at [facility], [place] [address], [place], [place] [address]    Report Status PENDING  Incomplete  Culture, blood (routine x 2)     Status: Abnormal   Collection Time: 11/06/21  5:52 AM   Specimen: BLOOD LEFT HAND  Result Value Ref Range Status   Specimen Description   Final    BLOOD  LEFT HAND Performed at [place] [address], [place], [place] [address]    Special Requests   Final    BOTTLES DRAWN AEROBIC AND ANAEROBIC Blood Culture adequate volume Performed at [place] [address], [place], [place] [address]    Culture  Setup Time   Final    GRAM POSITIVE COCCI ANAEROBIC BOTTLE ONLY CRITICAL VALUE NOTED.  VALUE IS CONSISTENT WITH PREVIOUSLY REPORTED AND CALLED VALUE.  Culture (A)  Final    STAPHYLOCOCCUS AUREUS SUSCEPTIBILITIES PERFORMED ON PREVIOUS CULTURE WITHIN THE LAST 5 DAYS. Performed at [facility], [place] [address], [place], [place] [address]    Report Status 11/08/2021 FINAL  Final  Culture, blood (routine x 2)     Status: None (Preliminary result)   Collection Time: 11/07/21  9:29 AM   Specimen: BLOOD RIGHT HAND  Result Value Ref Range Status   Specimen Description   Final    BLOOD RIGHT HAND Performed at [place] [address] [place], [place] [address]    Special Requests   Final    BOTTLES DRAWN AEROBIC ONLY Blood Culture adequate volume Performed at [facility] [address] [place], [place] [address]    Culture  Setup Time   Final    GRAM POSITIVE COCCI AEROBIC BOTTLE ONLY CRITICAL VALUE NOTED.  VALUE IS CONSISTENT WITH PREVIOUSLY REPORTED AND CALLED VALUE. Performed at [facility], [place] [address] [place], [place] [address]    Culture GRAM POSITIVE COCCI  Final   Report Status PENDING  Incomplete     RN Pressure Injury Documentation:     Estimated body mass index is 33.91 kg/m as calculated from the following:   Height as of this encounter: 6' (1.829 m).   Weight as of this encounter: 113.4 kg.  Malnutrition Type:   Malnutrition Characteristics:   Nutrition Interventions:     Radiology Studies: No results found.  Scheduled Meds:  albuterol  2.5 mg Nebulization TID   amLODipine  5 mg Oral Daily    atorvastatin  20 mg Oral Daily   enoxaparin (LOVENOX) injection  60 mg Subcutaneous Q24H   losartan  50 mg Oral Daily   mometasone-formoterol  2 puff Inhalation BID   QUEtiapine  25 mg Oral QHS   sodium chloride flush  3 mL Intravenous Q12H   Continuous Infusions:  sodium chloride 10 mL/hr at 11/03/21 1032   vancomycin 1,500 mg (11/08/21 1235)    LOS: 6 days    Kerney Elbe, DO [facility] PAGER is on [place]  If 7PM-7AM, please contact night-coverage [email or web address]

## 2021-11-09 DIAGNOSIS — J390 Retropharyngeal and parapharyngeal abscess: Secondary | ICD-10-CM | POA: Diagnosis not present

## 2021-11-09 DIAGNOSIS — M4620 Osteomyelitis of vertebra, site unspecified: Secondary | ICD-10-CM | POA: Diagnosis not present

## 2021-11-09 DIAGNOSIS — R1031 Right lower quadrant pain: Secondary | ICD-10-CM | POA: Diagnosis not present

## 2021-11-09 DIAGNOSIS — R7881 Bacteremia: Secondary | ICD-10-CM | POA: Diagnosis not present

## 2021-11-09 DIAGNOSIS — D649 Anemia, unspecified: Secondary | ICD-10-CM

## 2021-11-09 LAB — CULTURE, BLOOD (ROUTINE X 2)
Culture: NO GROWTH
Culture: NO GROWTH
Special Requests: ADEQUATE
Special Requests: ADEQUATE
Special Requests: ADEQUATE

## 2021-11-09 LAB — RETICULOCYTES
Immature Retic Fract: 10.9 % (ref 2.3–15.9)
RBC.: 3.18 MIL/uL — ABNORMAL LOW (ref 4.22–5.81)
Retic Count, Absolute: 41 10*3/uL (ref 19.0–186.0)
Retic Ct Pct: 1.3 % (ref 0.4–3.1)

## 2021-11-09 LAB — COMPREHENSIVE METABOLIC PANEL
ALT: 48 U/L — ABNORMAL HIGH (ref 0–44)
AST: 45 U/L — ABNORMAL HIGH (ref 15–41)
Albumin: 2.3 g/dL — ABNORMAL LOW (ref 3.5–5.0)
Alkaline Phosphatase: 73 U/L (ref 38–126)
Anion gap: 7 (ref 5–15)
BUN: 19 mg/dL (ref 8–23)
CO2: 25 mmol/L (ref 22–32)
Calcium: 8.6 mg/dL — ABNORMAL LOW (ref 8.9–10.3)
Chloride: 98 mmol/L (ref 98–111)
Creatinine, Ser: 0.54 mg/dL — ABNORMAL LOW (ref 0.61–1.24)
GFR, Estimated: >60 mL/min (ref >60)
Glucose, Bld: 137 mg/dL — ABNORMAL HIGH (ref 70–99)
Potassium: 3.7 mmol/L (ref 3.5–5.1)
Sodium: 130 mmol/L — ABNORMAL LOW (ref 135–145)
Total Bilirubin: 1.8 mg/dL — ABNORMAL HIGH (ref 0.3–1.2)
Total Protein: 6.9 g/dL (ref 6.5–8.1)

## 2021-11-09 LAB — CBC WITH DIFFERENTIAL/PLATELET
Abs Immature Granulocytes: 0.14 10*3/uL — ABNORMAL HIGH (ref 0.00–0.07)
Basophils Absolute: 0 10*3/uL (ref 0.0–0.1)
Basophils Relative: 0 %
Eosinophils Absolute: 0 10*3/uL (ref 0.0–0.5)
Eosinophils Relative: 0 %
HCT: 27.8 % — ABNORMAL LOW (ref 39.0–52.0)
Hemoglobin: 9.6 g/dL — ABNORMAL LOW (ref 13.0–17.0)
Immature Granulocytes: 1 %
Lymphocytes Relative: 6 %
Lymphs Abs: 0.7 10*3/uL (ref 0.7–4.0)
MCH: 29.9 pg (ref 26.0–34.0)
MCHC: 34.5 g/dL (ref 30.0–36.0)
MCV: 86.6 fL (ref 80.0–100.0)
Monocytes Absolute: 0.8 10*3/uL (ref 0.1–1.0)
Monocytes Relative: 7 %
Neutro Abs: 9.5 10*3/uL — ABNORMAL HIGH (ref 1.7–7.7)
Neutrophils Relative %: 86 %
Platelets: 218 10*3/uL (ref 150–400)
RBC: 3.21 MIL/uL — ABNORMAL LOW (ref 4.22–5.81)
RDW: 14.7 % (ref 11.5–15.5)
WBC: 11.2 10*3/uL — ABNORMAL HIGH (ref 4.0–10.5)
nRBC: 0 % (ref 0.0–0.2)

## 2021-11-09 LAB — FOLATE: Folate: 10.5 ng/mL (ref >5.9)

## 2021-11-09 LAB — IRON AND TIBC
Iron: 37 ug/dL — ABNORMAL LOW (ref 45–182)
Saturation Ratios: 24 % (ref 17.9–39.5)
TIBC: 153 ug/dL — ABNORMAL LOW (ref 250–450)
UIBC: 116 ug/dL

## 2021-11-09 LAB — PHOSPHORUS: Phosphorus: 2.8 mg/dL (ref 2.5–4.6)

## 2021-11-09 LAB — CK: Total CK: 122 U/L (ref 49–397)

## 2021-11-09 LAB — FERRITIN: Ferritin: 993 ng/mL — ABNORMAL HIGH (ref 24–336)

## 2021-11-09 LAB — VITAMIN B12: Vitamin B-12: 894 pg/mL (ref 180–914)

## 2021-11-09 LAB — MAGNESIUM: Magnesium: 1.8 mg/dL (ref 1.7–2.4)

## 2021-11-09 MED ORDER — SODIUM CHLORIDE 0.9 % IV SOLN
INTRAVENOUS | Status: DC
Start: 2021-11-09 — End: 2021-11-11

## 2021-11-09 MED ORDER — MORPHINE SULFATE (PF) 2 MG/ML IV SOLN
2.0000 mg | INTRAVENOUS | Status: DC | PRN
  Administered 2021-11-09 – 2021-11-22 (×20): 2 mg via INTRAVENOUS
  Filled 2021-11-09 (×22): qty 1

## 2021-11-09 MED ORDER — MAGNESIUM SULFATE 2 GM/50ML IV SOLN
2.0000 g | Freq: Once | INTRAVENOUS | Status: AC
  Administered 2021-11-09: 2 g via INTRAVENOUS
  Filled 2021-11-09: qty 50

## 2021-11-09 MED ORDER — SODIUM CHLORIDE 0.9 % IV SOLN
8.0000 mg/kg | Freq: Every day | INTRAVENOUS | Status: AC
  Administered 2021-11-09 – 2021-11-22 (×14): 750 mg via INTRAVENOUS
  Filled 2021-11-09 (×14): qty 15

## 2021-11-09 MED ORDER — SODIUM CHLORIDE 0.9 % IV SOLN
600.0000 mg | Freq: Three times a day (TID) | INTRAVENOUS | Status: AC
  Administered 2021-11-09 – 2021-11-22 (×40): 600 mg via INTRAVENOUS
  Filled 2021-11-09 (×51): qty 20

## 2021-11-09 NOTE — Progress Notes (Addendum)
Pharmacy Antibiotic Note  Charles Weiss. is a 70 y.o. male admitted on 11/02/2021 with MRSA bacteremia. Repeat blood cultures on 11/17 and 11/18 both with MRSA.  Pharmacy has been consulted to switch vancomycin to daptomycin and ceftaroline after discussion with ID.  11/09/21 WBC trending down to 11.2; creatinine 0.54 (stable); remains afebrile. Daptomycin susceptibilities updated and is susceptible to MRSA  Plan:  Discontinue vancomycin Start daptomycin 750mg  IV q24 hours (using adjusted body weight) Start ceftaroline 600mg  IV q8 hours F/u CK level today and check weekly thereafter TEE planned for 11/22 F/u further blood culture data  Height: 6' (182.9 cm) Weight: 113.4 kg (250 lb) IBW/kg (Calculated) : 77.6  Temp (24hrs), Avg:98.3 F (36.8 C), Min:98.1 F (36.7 C), Max:98.7 F (37.1 C)  Recent Labs  Lab 11/02/21 1632 11/03/21 0440 11/05/21 0441 11/06/21 0100 11/06/21 1000 11/07/21 0419 11/08/21 0451 11/09/21 0623  WBC  --    < > 21.4* 16.9*  --  13.7* 14.3* 11.2*  CREATININE  --    < > 0.72 0.76  --  0.75 0.63 0.54*  LATICACIDVEN 1.2  --   --   --   --   --   --   --   VANCOTROUGH  --   --   --   --  8*  --   --   --   VANCOPEAK  --   --   --  19*  --   --   --   --    < > = values in this interval not displayed.     Estimated Creatinine Clearance: 111.7 mL/min (A) (by C-G formula based on SCr of 0.54 mg/dL (L)).    Allergies  Allergen Reactions   Elavil [Amitriptyline] Other (See Comments)   Tetracyclines & Related Other (See Comments)   Antimicrobials this admission: 11/13 Vanc >> 11/19 11/13 Cefepime >>11/14 11/13 Flagyl x1 in ED 11/14 Acyclovir >>11/14 11/14 Ampicillin >>11/14 11/20 Daptomycin >> 11/20 Ceftaroline >>  Dose adjustments this admission: 11/17 VP 19, VT 8 on 1 gm q12> incr to 1500 q12  Microbiology results: 11/13 BCx: 4/4 [facility]CC (BCID+MRSA) 11/13 UCx: >100K MRSA F 11/13 Resp PCR: negative for COVID/Influenza 11/15 BCx2:  (aerobic only each "set") ngtd 11/17 BCx2: (aerobic only each "set") 1/2 MRSA 11/18 Bcx2: 1/3 MRSA 11/19 Bcx2:sent  Thank you for allowing pharmacy to be a part of this patient's care.  Dimple Nanas, PharmD 11/09/2021 11:36 AM

## 2021-11-09 NOTE — Progress Notes (Signed)
PROGRESS NOTE    Bay Durrell [place].  YIF:[id number] DOB: 10/03/1951 DOA: 11/02/2021 PCP: System, Provider Not In  Brief Narrative:  The patient is a 70 year old Caucasian male with a past medical history significant for but limited to asthma, hypertension as well as other comorbidities with recent admission for influenza pneumonia and respiratory failure who came back to the emergency room with neck pain and headache.  He is noted to have a high fever and upon further work-up he is found to have evidence of discitis in the lumbar vertebra.  Blood cultures were positive for MRSA.  He was started on vancomycin and ID was consulted.  He is undergoing further work-up and his mental status is a little off and he is agitated.  MRI was unrevealing.  ID recommending repeating MRI later this week if mentation has not improved.  Continues to be extremely confused and is in soft wrist restraints given that he is a danger to himself given his mentation but was a little better so restraints were removed.  Continues to have significant right-sided abdominal pain but CT scan of the abdomen pelvis done as below.  Of note he does have some bruising on his abdomen on the right side likely from his enoxaparin injection  Assessment & Plan:   Principal Problem:   Sepsis ([place]) Active Problems:   Hypertension   Hyperlipidemia   Asthma   Acute respiratory failure with hypoxia (HCC)   Vertebral osteomyelitis (HCC)   MRSA bacteremia   Retropharyngeal abscess   Right lower quadrant abdominal pain  Sepsis secondary to MRSA bacteremia -Noted to be febrile with T-max of 103.4, tachycardic heart rate in the 110s, tachypneic source of infection is discitis.  Evidence of endorgan damage includes encephalopathy related to sepsis. Now Still febrile an had a Temperature of 100.8 this AM  -Infectious disease following and recommending continuing daily evaluation for CNS symptoms and repeat MRI at the end of the week  if persistent or worsening headache or other CNS symptoms -Currently on vancomycin and ID is following but since Cx's are positive still, I spoke with Dr. Candiss Norse who recommends changing to Daptomycin/Ceftraoline  -Repeat blood culture still persistently Positive  -IV fluids have now stopped -Repeated blood cultures 11/07/2021 and they are still persistently Positive so will repeat Blood Cx 11/09/21 -Will eventually need PICC line once cultures have cleared -ID is recommending obtaining repeat blood cultures today -2D echocardiogram performed that was unremarkable but will need TEE given persistent bacteremia will obtain TEE and will be done Tuesday 11/11/2021 -We will resume IV fluid hydration with normal saline at 75 MLS per hour -PT OT recommending SNF once medically stable to be discharged   Discitis/vertebral osteomyelitis -Noted to have significant discitis and lumbar vertebral/sacrum -Also noted to have significant epidural enhancement -Continue on IV antibiotics total of 8 weeks -Discussed with infectious disease/neurology was not felt that lumbar puncture would change management at this point -Checking ECHOCardiogram as above -WBC has gone from 24.0 -> 21.4 -> 16.9 -> 13.7 and is now worsened to 14.3 but is now improved to 11.2 -Repeat blood cultures x2 from 01/04/2021 showed no growth to date at 2 days and they are being repeated again on 11/06/2021 for positive and repeated blood cultures on 11/07/2021 and are still pending   Abdominal Pain -Noted to have diffuse abdominal tenderness -Unclear if this is contributing to underlying sepsis -Check CT abdomen and showed "Presumably degenerative endplate changes at O6-V6 and L4-L5. There are mild inflammatory changes  and small lymph nodes in the anterior paravertebral soft tissues at this level. If there is high clinical concern for infection, recommend MRI.  Gallbladder sludge. Colonic diverticulosis without evidence for acute  diverticulitis." -IV Morphine was initiated by night coverage at 2 mg q1hprn but will cut back to q4hprn  -Patient has Ketorolac as well  -ID is continuing IV vancomycin and recommending follow-up with daptomycin sensitivities and MRSA is sensitive to it -Abdominal pain could be from his bruising from his enoxaparin injection   Acute Encephalopathy with Agitation, improved -Related to sepsis and likely infection -He is intermittently agitated and confused and combative -Discontinued lorazepam start the patient on Haldol 2 mg every 6 as needed -Place on Delirium Precautions -MRI Brain done and showed "Severely motion degraded examination without evidence of acute intracranial abnormality. Mild-to-moderate chronic small vessel ischemic disease."  -Continue to monitor -Added 25 mg of quetiapine p.o. nightly and this is improved his mentation -Could be related to pain as his mentation improved with better pain control -Wrist restraints have been removed  -ID recommending repeating MRI at the end of this week if he continues with persistent worsening headache or other CNS symptoms -We will place on delirium precautions   Hypertension -Currently on amlodipine and losartan and being continued on both of those with amlodipine 5 mg p.o. daily and losartan 50 mg p.o. daily -Holding hydrochlorothiazide since it is likely following completed -Blood pressure likely further elevated due to pain -Continue to Monitor BP per Protocol -Last BP reading was 161/84   Hyperlipidemia -Continued on Atorvastatin 20 g p.o. daily but will hold given his abnormal LFTs now   Sore throat -Noted on CT scan to have retropharyngeal erythema.  No comment of obvious abscess no CT -EDP discussed with ENT who recommended conservative management with IV antibiotics -Was also recommended that he receive Decadron 8 mg every 8 hours x 3 doses; has completed his Decadron doses -If symptoms worsen over the next 24 to 48  hours, recommendations are for reimaging but did not complain of Sore Throat now -Continue to Monitor closely    Asthma -Currently no wheezing -Continue on bronchodilators, Dulera -Continue to Monitor Respiratory Status carefully -SpO2: 97 % O2 Flow Rate (L/min): 2 L/min  Hyponatremia, improving -Patient's sodium went from 131 -> 130 -> 131 x2 and today was 134 -> 130 -Continued with IV fluid hydration as above and have now resumed at 25 MLS per hour -Repeat CMP in the a.m.  Abnormal LFTs -Mild and likely reactive -AST went from 20 -> 33 -> 45 -ALT went from 26 -> 32 -> 48 -Had CT Scan of the Abd/Pelvis as above -Holding statin as above and will check CK -Continue to Monitor and  trend hepatic function panel and if continues to be elevated will obtain a right upper quadrant ultrasound as well as an acute hepatitis panel -Repeat CMP in the AM   Hyperbilirubinemia -Improving and likely reactive -T bili went from 2.4 -> 2.5 -> 2.4 -> 1.8 -Continue to monitor and trend -Repeat CMP in the a.m.  Normocytic Anemia -Patient's hemoglobin/hematocrit went from 11.2/32.2 -> 11.9/34.0 -> 12.7/36.8 -> 10.7/31.0 and today was 11.2/33.3 -> 9.6/27.8 -EMEA panel was checked and showed an iron level of 37, U IBC 116, TIBC 153, saturation ratios of 24, ferritin level 993, folate of 10.5, and vitamin B12 894 -We will start p.o. Niferex -Continue to monitor for signs and symptoms of bleeding; currently no overt bleeding noted -Repeat CBC in a.m.  Obesity -  Complicates overall prognosis and care -Estimated body mass index is 33.91 kg/m as calculated from the following:   Height as of this encounter: 6' (1.829 m).   Weight as of this encounter: 113.4 kg. -Weight loss and Dietary Counseling given   DVT prophylaxis: Enoxaparin Code Status: DO NOT RESUSCITATE Family Communication: No family currently at bedside but I spoke with his Sister over the phone yesterday  Disposition Plan: Pending  further clinical improvement and clearance by ID and further workup for his MRSA bacteremia  Status is: Inpatient  Remains inpatient appropriate because: Remains somewhat confused and encephalopathic we will need ID clearance and antibiotic recommendations prior to safe discharge disposition   Consultants:  Infectious Diseases  Procedures:  ECHOCARDIOGRAM IMPRESSIONS     1. Left ventricular ejection fraction, by estimation, is 55 to 60%. The  left ventricle has normal function. The left ventricle has no regional  wall motion abnormalities. There is mild concentric left ventricular  hypertrophy. Left ventricular diastolic  parameters are consistent with Grade I diastolic dysfunction (impaired  relaxation).   2. Right ventricular systolic function is normal. The right ventricular  size is normal.   3. The mitral valve is normal in structure. No evidence of mitral valve  regurgitation. No evidence of mitral stenosis.   4. The aortic valve is normal in structure. Aortic valve regurgitation is  not visualized. No aortic stenosis is present.   5. There is borderline dilatation of the ascending aorta, measuring 36  mm.   6. The inferior vena cava is normal in size with greater than 50%  respiratory variability, suggesting right atrial pressure of 3 mmHg.   Comparison(s): No prior Echocardiogram.   FINDINGS   Left Ventricle: Left ventricular ejection fraction, by estimation, is 55  to 60%. The left ventricle has normal function. The left ventricle has no  regional wall motion abnormalities. Definity contrast agent was given IV  to delineate the left ventricular   endocardial borders. The left ventricular internal cavity size was normal  in size. There is mild concentric left ventricular hypertrophy. Left  ventricular diastolic parameters are consistent with Grade I diastolic  dysfunction (impaired relaxation).   Right Ventricle: The right ventricular size is normal. No increase in   right ventricular wall thickness. Right ventricular systolic function is  normal.   Left Atrium: Left atrial size was normal in size.   Right Atrium: Right atrial size was normal in size.   Pericardium: There is no evidence of pericardial effusion. Presence of  epicardial fat layer.   Mitral Valve: The mitral valve is normal in structure. No evidence of  mitral valve regurgitation. No evidence of mitral valve stenosis. MV peak  gradient, 11.7 mmHg. The mean mitral valve gradient is 5.0 mmHg.   Tricuspid Valve: The tricuspid valve is normal in structure. Tricuspid  valve regurgitation is not demonstrated. No evidence of tricuspid  stenosis.   Aortic Valve: The aortic valve is normal in structure. Aortic valve  regurgitation is not visualized. No aortic stenosis is present.   Pulmonic Valve: The pulmonic valve was normal in structure. Pulmonic valve  regurgitation is not visualized. No evidence of pulmonic stenosis.   Aorta: There is borderline dilatation of the ascending aorta, measuring 36  mm.   Venous: The inferior vena cava is normal in size with greater than 50%  respiratory variability, suggesting right atrial pressure of 3 mmHg.   IAS/Shunts: No atrial level shunt detected by color flow Doppler.      LEFT  VENTRICLE  PLAX 2D  LVIDd:         4.50 cm      Diastology  LVIDs:         3.60 cm      LV e' medial:    19.00 cm/s  LV PW:         1.07 cm      LV E/e' medial:  8.2  LV IVS:        1.04 cm      LV e' lateral:   17.90 cm/s  LVOT diam:     2.10 cm      LV E/e' lateral: 8.7  LV SV:         95  LV SV Index:   40  LVOT Area:     3.46 cm     LV Volumes (MOD)  LV vol d, MOD A2C: 104.2 ml  LV vol d, MOD A4C: 112.0 ml  LV vol s, MOD A2C: 45.8 ml  LV vol s, MOD A4C: 54.9 ml  LV SV MOD A2C:     58.3 ml  LV SV MOD A4C:     112.0 ml  LV SV MOD BP:      57.0 ml   RIGHT VENTRICLE            IVC  RV S prime:     9.36 cm/s  IVC diam: 2.60 cm  TAPSE (M-mode): 1.4 cm    LEFT ATRIUM             Index        RIGHT ATRIUM           Index  LA diam:        3.20 cm 1.37 cm/m   RA Area:     11.50 cm  LA Vol (A2C):   73.4 ml 31.33 ml/m  RA Volume:   24.70 ml  10.54 ml/m  LA Vol (A4C):   26.5 ml 11.31 ml/m  LA Biplane Vol: 45.0 ml 19.21 ml/m   AORTIC VALVE  LVOT Vmax:   141.00 cm/s  LVOT Vmean:  94.600 cm/s  LVOT VTI:    0.273 m     AORTA  Ao Root diam: 3.80 cm  Ao Asc diam:  3.60 cm   MITRAL VALVE  MV Area (PHT): 3.60 cm     SHUNTS  MV Area VTI:   3.25 cm     Systemic VTI:  0.27 m  MV Peak grad:  11.7 mmHg    Systemic Diam: 2.10 cm  MV Mean grad:  5.0 mmHg  MV Vmax:       1.71 m/s  MV Vmean:      101.0 cm/s  MV Decel Time: 211 msec  MV E velocity: 155.00 cm/s   Antimicrobials:  Anti-infectives (From admission, onward)    Start     Dose/Rate Route Frequency Ordered Stop   11/09/21 1230  DAPTOmycin (CUBICIN) 750 mg in sodium chloride 0.9 % IVPB        8 mg/kg  91.9 kg (Adjusted) 130 mL/hr over 30 Minutes Intravenous Daily 11/09/21 1139     11/06/21 2200  vancomycin (VANCOREADY) IVPB 1500 mg/300 mL  Status:  Discontinued        1,500 mg 150 mL/hr over 120 Minutes Intravenous Every 12 hours 11/06/21 1223 11/09/21 1128   11/03/21 1400  ceFEPIme (MAXIPIME) 2 g in sodium chloride 0.9 % 100 mL IVPB  Status:  Discontinued  2 g 200 mL/hr over 30 Minutes Intravenous Every 8 hours 11/03/21 1048 11/03/21 1119   11/03/21 1200  ampicillin (OMNIPEN) 1 g in sodium chloride 0.9 % 100 mL IVPB  Status:  Discontinued        1 g 300 mL/hr over 20 Minutes Intravenous Every 6 hours 11/03/21 1041 11/03/21 1119   11/03/21 0600  metroNIDAZOLE (FLAGYL) IVPB 500 mg  Status:  Discontinued        500 mg 100 mL/hr over 60 Minutes Intravenous Every 12 hours 11/02/21 2031 11/02/21 2248   11/03/21 0200  ceFEPIme (MAXIPIME) 2 g in sodium chloride 0.9 % 100 mL IVPB  Status:  Discontinued        2 g 200 mL/hr over 30 Minutes Intravenous Every 8 hours 11/02/21 2101  11/03/21 0445   11/03/21 0100  acyclovir (ZOVIRAX) 775 mg in dextrose 5 % 100 mL IVPB  Status:  Discontinued        10 mg/kg  77.6 kg (Ideal) 115.5 mL/hr over 60 Minutes Intravenous Every 8 hours 11/02/21 2308 11/03/21 0445   11/03/21 0000  ampicillin (OMNIPEN) 2 g in sodium chloride 0.9 % 100 mL IVPB  Status:  Discontinued        2 g 300 mL/hr over 20 Minutes Intravenous Every 4 hours 11/02/21 2308 11/03/21 0445   11/02/21 2200  vancomycin (VANCOREADY) IVPB 1000 mg/200 mL  Status:  Discontinued        1,000 mg 200 mL/hr over 60 Minutes Intravenous Every 12 hours 11/02/21 2101 11/06/21 1223   11/02/21 1700  ceFEPIme (MAXIPIME) 2 g in sodium chloride 0.9 % 100 mL IVPB        2 g 200 mL/hr over 30 Minutes Intravenous  Once 11/02/21 1646 11/02/21 1805   11/02/21 1700  metroNIDAZOLE (FLAGYL) IVPB 500 mg        500 mg 100 mL/hr over 60 Minutes Intravenous  Once 11/02/21 1646 11/02/21 2015   11/02/21 1700  vancomycin (VANCOCIN) IVPB 1000 mg/200 mL premix        1,000 mg 200 mL/hr over 60 Minutes Intravenous  Once 11/02/21 1646 11/02/21 2130        Subjective: Seen and examined at bedside and and he states that he was hurting today.  His mentation is much better today.  No nausea or vomiting.  Complaining of neck pain and back pain.  Feels okay.  Objective: Vitals:   11/08/21 2130 11/09/21 0529 11/09/21 0808 11/09/21 1348  BP: (!) 155/85 138/69  (!) 161/84  Pulse: (!) 101 (!) 107  99  Resp: 16 16  16   Temp: 98.2 F (36.8 C) 98.7 F (37.1 C)  98.9 F (37.2 C)  TempSrc: Oral   Oral  SpO2: 99% 97% 96% 97%  Weight:      Height:        Intake/Output Summary (Last 24 hours) at 11/09/2021 1406 Last data filed at 11/09/2021 1151 Gross per 24 hour  Intake --  Output 300 ml  Net -300 ml    Filed Weights   11/02/21 0701  Weight: 113.4 kg   Examination: Physical Exam:  Constitutional: WN/WD obese Caucasian male currently in no acute distress appears little uncomfortable laying  in bed  Eyes: Lids and conjunctivae normal, sclerae anicteric  ENMT: External Ears, Nose appear normal. Grossly normal hearing. Mucous membranes are moist.  Neck: Appears normal, supple, no cervical masses, normal ROM, no appreciable thyromegaly; no appreciable JVD Respiratory: Diminished to auscultation bilaterally, no wheezing, rales, rhonchi  or crackles. Normal respiratory effort and patient is not tachypenic. No accessory muscle use.  Unlabored breathing Cardiovascular: Tachycardic rate but regular rhythm, no murmurs / rubs / gallops. S1 and S2 auscultated.  Has some slight extremity edema Abdomen: Soft, non-tender, distended secondary body habitus.  Bowel sounds positive.  GU: Deferred. Musculoskeletal: No clubbing / cyanosis of digits/nails. No joint deformity upper and lower extremities.  Skin: No rashes, lesions, ulcers on limited skin evaluation. No induration; Warm and dry.  Neurologic: CN 2-12 grossly intact with no focal deficits. Romberg sign and cerebellar reflexes not assessed.  Psychiatric: Normal judgment and insight. Alert and oriented x 3. Normal mood and appropriate affect.   Data Reviewed: I have personally reviewed following labs and imaging studies  CBC: Recent Labs  Lab 11/05/21 0441 11/06/21 0100 11/07/21 0419 11/08/21 0451 11/09/21 0623  WBC 21.4* 16.9* 13.7* 14.3* 11.2*  NEUTROABS  --  14.3* 11.4* 12.4* 9.5*  HGB 11.9* 12.7* 10.7* 11.2* 9.6*  HCT 34.0* 36.8* 31.0* 33.3* 27.8*  MCV 87.4 87.4 88.6 90.2 86.6  PLT 243 218 198 229 856    Basic Metabolic Panel: Recent Labs  Lab 11/05/21 0441 11/06/21 0100 11/07/21 0419 11/08/21 0451 11/09/21 0623  NA 130* 131* 131* 134* 130*  K 4.5 3.9 3.7 3.9 3.7  CL 95* 94* 99 98 98  CO2 26 24 24 24 25   GLUCOSE 137* 110* 136* 125* 137*  BUN 22 24* 27* 24* 19  CREATININE 0.72 0.76 0.75 0.63 0.54*  CALCIUM 8.7* 9.0 8.3* 8.8* 8.6*  MG  --  1.8 2.3 2.1 1.8  PHOS  --  3.4 3.9 3.2 2.8    GFR: Estimated Creatinine  Clearance: 111.7 mL/min (A) (by C-G formula based on SCr of 0.54 mg/dL (L)). Liver Function Tests: Recent Labs  Lab 11/03/21 0440 11/06/21 0100 11/07/21 0419 11/08/21 0451 11/09/21 0623  AST 19 23 20  33 45*  ALT 30 27 26  32 48*  ALKPHOS 68 68 54 68 73  BILITOT 1.1 2.4* 2.5* 2.4* 1.8*  PROT 6.7 7.7 6.5 7.4 6.9  ALBUMIN 2.5* 2.7* 2.3* 2.6* 2.3*    No results for input(s): LIPASE, AMYLASE in the last 168 hours. No results for input(s): AMMONIA in the last 168 hours. Coagulation Profile: Recent Labs  Lab 11/02/21 1642  INR 1.3*    Cardiac Enzymes: Recent Labs  Lab 11/09/21 1310  CKTOTAL 122   BNP (last 3 results) No results for input(s): PROBNP in the last 8760 hours. HbA1C: No results for input(s): HGBA1C in the last 72 hours. CBG: Recent Labs  Lab 11/05/21 1626  GLUCAP 133*    Lipid Profile: No results for input(s): CHOL, HDL, LDLCALC, TRIG, CHOLHDL, LDLDIRECT in the last 72 hours. Thyroid Function Tests: No results for input(s): TSH, T4TOTAL, FREET4, T3FREE, THYROIDAB in the last 72 hours. Anemia Panel: Recent Labs    11/09/21 0623  VITAMINB12 894  FOLATE 10.5  FERRITIN 993*  TIBC 153*  IRON 37*  RETICCTPCT 1.3   Sepsis Labs: Recent Labs  Lab 11/02/21 1632 11/03/21 0440  PROCALCITON  --  2.69  LATICACIDVEN 1.2  --      Recent Results (from the past 240 hour(s))  Blood culture (routine x 2)     Status: Abnormal   Collection Time: 11/02/21 12:12 PM   Specimen: BLOOD  Result Value Ref Range Status   Specimen Description   Final    BLOOD RIGHT ANTECUBITAL Performed at [facility], [address] [place],  [place] [address]    Special Requests   Final    BOTTLES DRAWN AEROBIC AND ANAEROBIC Blood Culture results may not be optimal due to an excessive volume of blood received in culture bottles Performed at [facility] [address] [place], [place] [address]    Culture  Setup Time   Final    GRAM  POSITIVE COCCI IN CLUSTERS IN BOTH AEROBIC AND ANAEROBIC BOTTLES CRITICAL RESULT CALLED TO, READ BACK BY AND VERIFIED WITH: M LILLISTON,PHARMD@[id number]  11/03/21 [place]    Culture (A)  Final    METHICILLIN RESISTANT STAPHYLOCOCCUS AUREUS SEE SEPARATE REPORT Performed at [facility], [address] [place], [place] [address]    Report Status 11/09/2021 FINAL  Final   Organism ID, Bacteria METHICILLIN RESISTANT STAPHYLOCOCCUS AUREUS  Final      Susceptibility   Methicillin resistant staphylococcus aureus - MIC*    CIPROFLOXACIN >=8 RESISTANT Resistant     ERYTHROMYCIN >=8 RESISTANT Resistant     GENTAMICIN <=0.5 SENSITIVE Sensitive     OXACILLIN >=4 RESISTANT Resistant     TETRACYCLINE <=1 SENSITIVE Sensitive     VANCOMYCIN 1 SENSITIVE Sensitive     TRIMETH/SULFA <=10 SENSITIVE Sensitive     CLINDAMYCIN <=0.25 SENSITIVE Sensitive     RIFAMPIN <=0.5 SENSITIVE Sensitive     Inducible Clindamycin NEGATIVE Sensitive     * METHICILLIN RESISTANT STAPHYLOCOCCUS AUREUS  Blood Culture ID Panel (Reflexed)     Status: Abnormal   Collection Time: 11/02/21 12:12 PM  Result Value Ref Range Status   Enterococcus faecalis NOT DETECTED NOT DETECTED Final   Enterococcus Faecium NOT DETECTED NOT DETECTED Final   Listeria monocytogenes NOT DETECTED NOT DETECTED Final   Staphylococcus species DETECTED (A) NOT DETECTED Final    Comment: CRITICAL RESULT CALLED TO, READ BACK BY AND VERIFIED WITH: M LILLISTON,PHARMD@0431  11/03/21 [place]    Staphylococcus aureus (BCID) DETECTED (A) NOT DETECTED Final    Comment: Methicillin (oxacillin)-resistant Staphylococcus aureus (MRSA). MRSA is predictably resistant to beta-lactam antibiotics (except ceftaroline). Preferred therapy is vancomycin unless clinically contraindicated. Patient requires contact precautions if  hospitalized. CRITICAL RESULT CALLED TO, READ BACK BY AND VERIFIED WITH: M LILLISTON,PHARMD@[email or web address]  11/03/21 [place]    Staphylococcus epidermidis NOT DETECTED  NOT DETECTED Final   Staphylococcus lugdunensis NOT DETECTED NOT DETECTED Final   Streptococcus species NOT DETECTED NOT DETECTED Final   Streptococcus agalactiae NOT DETECTED NOT DETECTED Final   Streptococcus pneumoniae NOT DETECTED NOT DETECTED Final   Streptococcus pyogenes NOT DETECTED NOT DETECTED Final   A.calcoaceticus-baumannii NOT DETECTED NOT DETECTED Final   Bacteroides fragilis NOT DETECTED NOT DETECTED Final   Enterobacterales NOT DETECTED NOT DETECTED Final   Enterobacter cloacae complex NOT DETECTED NOT DETECTED Final   Escherichia coli NOT DETECTED NOT DETECTED Final   Klebsiella aerogenes NOT DETECTED NOT DETECTED Final   Klebsiella oxytoca NOT DETECTED NOT DETECTED Final   Klebsiella pneumoniae NOT DETECTED NOT DETECTED Final   Proteus species NOT DETECTED NOT DETECTED Final   Salmonella species NOT DETECTED NOT DETECTED Final   Serratia marcescens NOT DETECTED NOT DETECTED Final   Haemophilus influenzae NOT DETECTED NOT DETECTED Final   Neisseria meningitidis NOT DETECTED NOT DETECTED Final   Pseudomonas aeruginosa NOT DETECTED NOT DETECTED Final   Stenotrophomonas maltophilia NOT DETECTED NOT DETECTED Final   Candida albicans NOT DETECTED NOT DETECTED Final   Candida auris NOT DETECTED NOT DETECTED Final   Candida glabrata NOT DETECTED NOT DETECTED Final   Candida krusei NOT DETECTED NOT  DETECTED Final   Candida parapsilosis NOT DETECTED NOT DETECTED Final   Candida tropicalis NOT DETECTED NOT DETECTED Final   Cryptococcus neoformans/gattii NOT DETECTED NOT DETECTED Final   Meth resistant mecA/C and MREJ DETECTED (A) NOT DETECTED Final    Comment: CRITICAL RESULT CALLED TO, READ BACK BY AND VERIFIED WITH: M LILLISTON,PHARMD@0431  11/03/21 [place] Performed at [facility], [address] [place], [place] [address]   Blood culture (routine x 2)     Status: Abnormal   Collection Time: 11/02/21 12:17 PM   Specimen: BLOOD  Result Value Ref Range Status    Specimen Description   Final    BLOOD LEFT ANTECUBITAL Performed at [facility] [address] [place], [place] [address]    Special Requests   Final    BOTTLES DRAWN AEROBIC AND ANAEROBIC Blood Culture adequate volume Performed at [facility] [address], [place], [place] [address]    Culture  Setup Time   Final    GRAM POSITIVE COCCI IN CLUSTERS IN BOTH AEROBIC AND ANAEROBIC BOTTLES CRITICAL VALUE NOTED.  VALUE IS CONSISTENT WITH PREVIOUSLY REPORTED AND CALLED VALUE.    Culture (A)  Final    STAPHYLOCOCCUS AUREUS SUSCEPTIBILITIES PERFORMED ON PREVIOUS CULTURE WITHIN THE LAST 5 DAYS. Performed at [facility], [place] [address], [place],  [address]    Report Status 11/05/2021 FINAL  Final  Resp Panel by RT-PCR (Flu A&B, Covid) Nasopharyngeal Swab     Status: None   Collection Time: 11/02/21  4:16 PM   Specimen: Nasopharyngeal Swab; Nasopharyngeal(NP) swabs in vial transport medium  Result Value Ref Range Status   SARS Coronavirus 2 by RT PCR NEGATIVE NEGATIVE Final    Comment: (NOTE) SARS-CoV-2 target nucleic acids are NOT DETECTED.  The SARS-CoV-2 RNA is generally detectable in upper respiratory specimens during the acute phase of infection. The lowest concentration of SARS-CoV-2 viral copies this assay can detect is 138 copies/mL. A negative result does not preclude SARS-Cov-2 infection and should not be used as the sole basis for treatment or other patient management decisions. A negative result may occur with  improper specimen collection/handling, submission of specimen other than nasopharyngeal swab, presence of viral mutation(s) within the areas targeted by this assay, and inadequate number of viral copies(<138 copies/mL). A negative result must be combined with clinical observations, patient history, and epidemiological information. The expected result is Negative.  Fact Sheet for Patients:   EntrepreneurPulse.com.au  Fact Sheet for Healthcare Providers:  IncredibleEmployment.be  This test is no t yet approved or cleared by the [place] FDA and  has been authorized for detection and/or diagnosis of SARS-CoV-2 by FDA under an Emergency Use Authorization (EUA). This EUA will remain  in effect (meaning this test can be used) for the duration of the COVID-19 declaration under Section 564(b)(1) of the Act, 21 U.S.C.section 360bbb-3(b)(1), unless the authorization is terminated  or revoked sooner.       Influenza A by PCR NEGATIVE NEGATIVE Final   Influenza B by PCR NEGATIVE NEGATIVE Final    Comment: (NOTE) The Xpert Xpress SARS-CoV-2/FLU/RSV plus assay is intended as an aid in the diagnosis of influenza from Nasopharyngeal swab specimens and should not be used as a sole basis for treatment. Nasal washings and aspirates are unacceptable for Xpert Xpress SARS-CoV-2/FLU/RSV testing.  Fact Sheet for Patients: EntrepreneurPulse.com.au  Fact Sheet for Healthcare Providers: IncredibleEmployment.be  This test is not yet approved or cleared by the [place] FDA  and has been authorized for detection and/or diagnosis of SARS-CoV-2 by FDA under an Emergency Use Authorization (EUA). This EUA will remain in effect (meaning this test can be used) for the duration of the COVID-19 declaration under Section 564(b)(1) of the Act, 21 U.S.C. section 360bbb-3(b)(1), unless the authorization is terminated or revoked.  Performed at [facility], [place] [address] [place], [place] [address]   Urine Culture     Status: Abnormal   Collection Time: 11/02/21  4:33 PM   Specimen: In/Out Cath Urine  Result Value Ref Range Status   Specimen Description   Final    IN/OUT CATH URINE Performed at [facility] [address], [place], [place] [address]    Special Requests   Final     NONE Performed at [facility], [place] [address], [place], [place] [address]    Culture (A)  Final    >=100,000 COLONIES/mL METHICILLIN RESISTANT STAPHYLOCOCCUS AUREUS   Report Status 11/05/2021 FINAL  Final   Organism ID, Bacteria METHICILLIN RESISTANT STAPHYLOCOCCUS AUREUS (A)  Final      Susceptibility   Methicillin resistant staphylococcus aureus - MIC*    CIPROFLOXACIN >=8 RESISTANT Resistant     GENTAMICIN <=0.5 SENSITIVE Sensitive     NITROFURANTOIN <=16 SENSITIVE Sensitive     OXACILLIN >=4 RESISTANT Resistant     TETRACYCLINE <=1 SENSITIVE Sensitive     VANCOMYCIN 1 SENSITIVE Sensitive     TRIMETH/SULFA <=10 SENSITIVE Sensitive     CLINDAMYCIN <=0.25 SENSITIVE Sensitive     RIFAMPIN <=0.5 SENSITIVE Sensitive     Inducible Clindamycin NEGATIVE Sensitive     * >=100,000 COLONIES/mL METHICILLIN RESISTANT STAPHYLOCOCCUS AUREUS  Culture, blood (routine x 2)     Status: None (Preliminary result)   Collection Time: 11/04/21  5:19 AM   Specimen: BLOOD RIGHT HAND  Result Value Ref Range Status   Specimen Description   Final    BLOOD RIGHT HAND Performed at [facility], [address] [place], [place] [address]    Special Requests   Final    BOTTLES DRAWN AEROBIC ONLY Blood Culture adequate volume Performed at [facility] [address] [place], [place] [address]    Culture   Final    NO GROWTH 4 DAYS Performed at [facility], [place] [address] [place], [place] [address]    Report Status PENDING  Incomplete  Culture, blood (routine x 2)     Status: None (Preliminary result)   Collection Time: 11/04/21  5:19 AM   Specimen: BLOOD LEFT HAND  Result Value Ref Range Status   Specimen Description   Final    BLOOD LEFT HAND Performed at [facility], [place] [address], [place], [place] [address]    Special Requests   Final    BOTTLES DRAWN AEROBIC ONLY Blood Culture adequate volume Performed at [facility] [address], [place], [place] [address]    Culture   Final    NO GROWTH 4 DAYS Performed at [facility], [place] [address], [place], [place] [address]    Report Status PENDING  Incomplete  Culture, blood (routine x 2)     Status: None (Preliminary result)   Collection Time: 11/06/21  5:48 AM   Specimen: BLOOD RIGHT HAND  Result Value Ref Range Status   Specimen Description   Final    BLOOD RIGHT HAND Performed at [facility]  [address], [place], [place] [address]    Special Requests   Final    BOTTLES DRAWN AEROBIC ONLY Blood Culture adequate volume Performed at [facility] [address], [place], [place] [address]    Culture   Final    NO GROWTH 2 DAYS Performed at [facility] [address], [place], [place] [address]    Report Status PENDING  Incomplete  Culture, blood (routine x 2)     Status: Abnormal   Collection Time: 11/06/21  5:52 AM   Specimen: BLOOD LEFT HAND  Result Value Ref Range Status   Specimen Description   Final    BLOOD LEFT HAND Performed at [facility] [address], [place], [place] [address]    Special Requests   Final    BOTTLES DRAWN AEROBIC AND ANAEROBIC Blood Culture adequate volume Performed at [facility] [address] [place], [place] [address]    Culture  Setup Time   Final    GRAM POSITIVE COCCI ANAEROBIC BOTTLE ONLY CRITICAL VALUE NOTED.  VALUE IS CONSISTENT WITH PREVIOUSLY REPORTED AND CALLED VALUE.    Culture (A)  Final    STAPHYLOCOCCUS AUREUS SUSCEPTIBILITIES PERFORMED ON PREVIOUS CULTURE WITHIN THE LAST 5 DAYS. Performed at [facility], [place] [address] [place], [place] [address]    Report Status 11/08/2021 FINAL  Final  Culture, blood (routine x 2)     Status: None (Preliminary result)   Collection Time: 11/07/21  9:28 AM   Specimen: BLOOD  Result Value Ref Range Status   Specimen Description   Final     BLOOD LEFT WRIST Performed at [facility] [address] [place], [place] [address]    Special Requests   Final    BOTTLES DRAWN AEROBIC AND ANAEROBIC Blood Culture adequate volume Performed at [facility] [address] [place], [place] [address]    Culture   Final    NO GROWTH 1 DAY Performed at [facility], [place] [address] [place], [place] [address]    Report Status PENDING  Incomplete  Culture, blood (routine x 2)     Status: Abnormal   Collection Time: 11/07/21  9:29 AM   Specimen: BLOOD RIGHT HAND  Result Value Ref Range Status   Specimen Description   Final    BLOOD RIGHT HAND Performed at [facility] [address], [place], [place] [address]    Special Requests   Final    BOTTLES DRAWN AEROBIC ONLY Blood Culture adequate volume Performed at [facility] [address], [place], [place] [address]    Culture  Setup Time   Final    GRAM POSITIVE COCCI AEROBIC BOTTLE ONLY CRITICAL VALUE NOTED.  VALUE IS CONSISTENT WITH PREVIOUSLY REPORTED AND CALLED VALUE.    Culture (A)  Final    STAPHYLOCOCCUS AUREUS SUSCEPTIBILITIES PERFORMED ON PREVIOUS CULTURE WITHIN THE LAST 5 DAYS. Performed at [facility], [place] [address], [place], [place] [address]    Report Status 11/09/2021 FINAL  Final     RN Pressure Injury Documentation:     Estimated body mass index is 33.91 kg/m as calculated from the following:   Height as of this encounter: 6' (1.829 m).   Weight as of this encounter: 113.4 kg.  Malnutrition Type:   Malnutrition Characteristics:   Nutrition Interventions:     Radiology Studies: No results found.  Scheduled Meds:  albuterol  2.5 mg Nebulization TID   atorvastatin  20 mg Oral Daily   enoxaparin (LOVENOX) injection  60 mg Subcutaneous Q24H   losartan  50 mg Oral Daily   mometasone-formoterol  2 puff Inhalation BID   QUEtiapine  25 mg Oral QHS   sodium  chloride flush  3 mL Intravenous Q12H   Continuous Infusions:  sodium chloride 10 mL/hr at 11/03/21 1032   sodium chloride     DAPTOmycin (CUBICIN)  IV      LOS: 7 days    Kerney Elbe, DO [facility] PAGER is on AMION  If 7PM-7AM, please contact night-coverage [email or web address]

## 2021-11-10 DIAGNOSIS — A4102 Sepsis due to Methicillin resistant Staphylococcus aureus: Secondary | ICD-10-CM | POA: Diagnosis not present

## 2021-11-10 DIAGNOSIS — J45909 Unspecified asthma, uncomplicated: Secondary | ICD-10-CM | POA: Diagnosis not present

## 2021-11-10 DIAGNOSIS — J9601 Acute respiratory failure with hypoxia: Secondary | ICD-10-CM | POA: Diagnosis not present

## 2021-11-10 DIAGNOSIS — E785 Hyperlipidemia, unspecified: Secondary | ICD-10-CM | POA: Diagnosis not present

## 2021-11-10 LAB — COMPREHENSIVE METABOLIC PANEL
ALT: 58 U/L — ABNORMAL HIGH (ref 0–44)
AST: 56 U/L — ABNORMAL HIGH (ref 15–41)
Albumin: 2.1 g/dL — ABNORMAL LOW (ref 3.5–5.0)
Alkaline Phosphatase: 72 U/L (ref 38–126)
Anion gap: 7 (ref 5–15)
BUN: 19 mg/dL (ref 8–23)
CO2: 26 mmol/L (ref 22–32)
Calcium: 8.3 mg/dL — ABNORMAL LOW (ref 8.9–10.3)
Chloride: 99 mmol/L (ref 98–111)
Creatinine, Ser: 0.64 mg/dL (ref 0.61–1.24)
GFR, Estimated: >60 mL/min (ref >60)
Glucose, Bld: 137 mg/dL — ABNORMAL HIGH (ref 70–99)
Potassium: 3.3 mmol/L — ABNORMAL LOW (ref 3.5–5.1)
Sodium: 132 mmol/L — ABNORMAL LOW (ref 135–145)
Total Bilirubin: 1.2 mg/dL (ref 0.3–1.2)
Total Protein: 6.6 g/dL (ref 6.5–8.1)

## 2021-11-10 LAB — PHOSPHORUS: Phosphorus: 3.2 mg/dL (ref 2.5–4.6)

## 2021-11-10 LAB — CBC WITH DIFFERENTIAL/PLATELET
Abs Immature Granulocytes: 0.17 10*3/uL — ABNORMAL HIGH (ref 0.00–0.07)
Basophils Absolute: 0 10*3/uL (ref 0.0–0.1)
Basophils Relative: 0 %
Eosinophils Absolute: 0 10*3/uL (ref 0.0–0.5)
Eosinophils Relative: 0 %
HCT: 27.1 % — ABNORMAL LOW (ref 39.0–52.0)
Hemoglobin: 9.3 g/dL — ABNORMAL LOW (ref 13.0–17.0)
Immature Granulocytes: 2 %
Lymphocytes Relative: 9 %
Lymphs Abs: 0.8 10*3/uL (ref 0.7–4.0)
MCH: 30.8 pg (ref 26.0–34.0)
MCHC: 34.3 g/dL (ref 30.0–36.0)
MCV: 89.7 fL (ref 80.0–100.0)
Monocytes Absolute: 0.7 10*3/uL (ref 0.1–1.0)
Monocytes Relative: 8 %
Neutro Abs: 7.7 10*3/uL (ref 1.7–7.7)
Neutrophils Relative %: 81 %
Platelets: 209 10*3/uL (ref 150–400)
RBC: 3.02 MIL/uL — ABNORMAL LOW (ref 4.22–5.81)
RDW: 14.9 % (ref 11.5–15.5)
WBC: 9.5 10*3/uL (ref 4.0–10.5)
nRBC: 0 % (ref 0.0–0.2)

## 2021-11-10 LAB — MAGNESIUM: Magnesium: 2.1 mg/dL (ref 1.7–2.4)

## 2021-11-10 MED ORDER — POTASSIUM CHLORIDE CRYS ER 20 MEQ PO TBCR
40.0000 meq | EXTENDED_RELEASE_TABLET | Freq: Two times a day (BID) | ORAL | Status: AC
  Administered 2021-11-10 (×2): 40 meq via ORAL
  Filled 2021-11-10 (×2): qty 2

## 2021-11-10 MED ORDER — PANTOPRAZOLE SODIUM 40 MG PO TBEC
40.0000 mg | DELAYED_RELEASE_TABLET | Freq: Every day | ORAL | Status: DC
  Administered 2021-11-10 – 2021-11-24 (×15): 40 mg via ORAL
  Filled 2021-11-10 (×15): qty 1

## 2021-11-10 MED ORDER — GABAPENTIN 300 MG PO CAPS
300.0000 mg | ORAL_CAPSULE | Freq: Three times a day (TID) | ORAL | Status: DC
  Administered 2021-11-10 – 2021-11-24 (×45): 300 mg via ORAL
  Filled 2021-11-10 (×46): qty 1

## 2021-11-10 NOTE — Progress Notes (Signed)
    [facility] for Infectious Disease    Date of Admission:  11/02/2021   Total days of antibiotics 8/day 2 dapto and ceftaroline           ID: Charles Weiss. is a 70 y.o. male with  prolonged mrsa bacteremia with Diffuse epidural enhancement at L2-3, L3-4, L4-5, and L5-S1 on 11/13 without discrete epidural abscess. Principal Problem:   Sepsis ([facility]) Active Problems:   Hypertension   Hyperlipidemia   Asthma   Acute respiratory failure with hypoxia (HCC)   Vertebral osteomyelitis (HCC)   MRSA bacteremia   Retropharyngeal abscess   Right lower quadrant abdominal pain    Subjective: Afebrile. Has some back pain but denies weakness or bladder/bowel incontinence.   Medications:   albuterol  2.5 mg Nebulization TID   enoxaparin (LOVENOX) injection  60 mg Subcutaneous Q24H   gabapentin  300 mg Oral TID   losartan  50 mg Oral Daily   mometasone-formoterol  2 puff Inhalation BID   pantoprazole  40 mg Oral Daily   potassium chloride  40 mEq Oral BID   QUEtiapine  25 mg Oral QHS   sodium chloride flush  3 mL Intravenous Q12H    Objective: Vital signs in last 24 hours: Temp:  [97.7 F (36.5 C)-99.3 F (37.4 C)] 97.7 F (36.5 C) (11/21 1300) Pulse Rate:  [92-100] 95 (11/21 1300) Resp:  [16-18] 18 (11/21 1300) BP: (125-161)/(66-77) 125/77 (11/21 1300) SpO2:  [94 %-98 %] 95 % (11/21 1353)  Physical Exam  Constitutional: He is oriented to person, place, and time. He appears well-developed and well-nourished. No distress.  HENT:  Mouth/Throat: Oropharynx is clear and moist. No oropharyngeal exudate.  Cardiovascular: Normal rate, regular rhythm and normal heart sounds. Exam reveals no gallop and no friction rub.  No murmur heard.  Pulmonary/Chest: Effort normal and breath sounds normal. No respiratory distress. He has no wheezes.  Abdominal: Soft. Bowel sounds are normal. He exhibits no distension. There is no tenderness.  Lymphadenopathy:  He has no cervical  adenopathy.  Neurological: He is alert and oriented to person, place, and time.  Skin: Skin is warm and dry. No rash noted. No erythema.  Psychiatric: He has a normal mood and affect. His behavior is normal.    Lab Results Recent Labs    11/09/21 0623 11/10/21 0431  WBC 11.2* 9.5  HGB 9.6* 9.3*  HCT 27.8* 27.1*  NA 130* 132*  K 3.7 3.3*  CL 98 99  CO2 25 26  BUN 19 19  CREATININE 0.54* 0.64   Liver Panel Recent Labs    11/09/21 0623 11/10/21 0431  PROT 6.9 6.6  ALBUMIN 2.3* 2.1*  AST 45* 56*  ALT 48* 58*  ALKPHOS 73 72  BILITOT 1.8* 1.2     Microbiology: 11/20 blood cx pending Studies/Results: No results found.   Assessment/Plan: MRSA bacteremia in the setting of early epidural abscess= continue on daptomycin and ceftaroline. Awaiting for results from TEE tomorrow. Plan is prolonged abtx of 8 wks, however may need repeat MRI of spine to see if developing epidural abscess if blood cx persistently positive on latest draw.    [facility] for Infectious Diseases Pager: [phone]  11/10/2021, 4:08 PM

## 2021-11-10 NOTE — Progress Notes (Signed)
PROGRESS NOTE    Charles Weiss [place].  AVW:[id number] DOB: 03-17-1951 DOA: 11/02/2021 PCP: System, Provider Not In  Brief Narrative:  The patient is a 70 year old Caucasian male with a past medical history significant for but limited to asthma, hypertension as well as other comorbidities with recent admission for influenza pneumonia and respiratory failure who came back to the emergency room with neck pain and headache.  He is noted to have a high fever and upon further work-up he is found to have evidence of discitis in the lumbar vertebra.  Blood cultures were positive for MRSA.  He was started on vancomycin and ID was consulted.  He is undergoing further work-up and his mental status is a little off and he is agitated.  MRI was unrevealing.  ID recommending repeating MRI later this week if mentation has not improved.  Continues to be extremely confused and is in soft wrist restraints given that he is a danger to himself given his mentation but was a little better so restraints were removed.  Continues to have significant right-sided abdominal pain but CT scan of the abdomen pelvis done as below.  Of note he does have some bruising on his abdomen on the right side likely from his enoxaparin injection  Assessment & Plan:   Principal Problem:   Sepsis ([place]) Active Problems:   Hypertension   Hyperlipidemia   Asthma   Acute respiratory failure with hypoxia (HCC)   Vertebral osteomyelitis (HCC)   MRSA bacteremia   Retropharyngeal abscess   Right lower quadrant abdominal pain  Sepsis secondary to MRSA bacteremia -Noted to be febrile with T-max of 103.4, tachycardic heart rate in the 110s, tachypneic source of infection is discitis.  Evidence of endorgan damage includes encephalopathy related to sepsis. Now Still febrile an had a Temperature of 100.8 this AM  -Infectious disease following and recommending continuing daily evaluation for CNS symptoms and repeat MRI at the end of the week  if persistent or worsening headache or other CNS symptoms -Currently on vancomycin and ID is following but since Cx's are positive still, I spoke with Dr. Candiss Norse who recommends changing to Daptomycin/Ceftraoline which was done on 11/09/21 -Repeat blood culture still persistently Positive  -IV fluids have now stopped -WBC has gone from 30.0 -> 9.5 -Repeated blood cultures 11/07/2021 and they are still persistently Positive so will repeat Blood Cx 11/09/21 pending  -Will eventually need PICC line once cultures have cleared -ID is recommending obtaining repeat blood cultures today -2D echocardiogram performed that was unremarkable but will need TEE given persistent bacteremia will obtain TEE and will be done Tuesday 11/11/2021 -We will resume IV fluid hydration with normal saline at 75 MLS per hour but it has now stopped  -PT OT recommending SNF once medically stable to be discharged   Discitis/vertebral osteomyelitis -Noted to have significant discitis and lumbar vertebral/sacrum -Also noted to have significant epidural enhancement -Continue on IV antibiotics total of 8 weeks -Discussed with infectious disease/neurology was not felt that lumbar puncture would change management at this point -Checking ECHOCardiogram as above -WBC has gone from 24.0 -> 21.4 -> 16.9 -> 13.7 and is now worsened to 14.3 but is now improved to 11.2 -> 9.5 -Repeat blood cultures x2 from 01/04/2021 showed no growth to date at 2 days and they are being repeated again on 11/06/2021 for positive and repeated blood cultures on 11/07/2021 and are still Positive so we have repeated them 11/09/21 -He continues to have significant back and neck pain  and feels like it is in his nerves -Resume Home Gabapentin 300 mg po TID    Abdominal Pain -Noted to have diffuse abdominal tenderness -Unclear if this is contributing to underlying sepsis -Check CT abdomen and showed "Presumably degenerative endplate changes at J0-D3 and L4-L5.  There are mild inflammatory changes and small lymph nodes in the anterior paravertebral soft tissues at this level. If there is high clinical concern for infection, recommend MRI.  Gallbladder sludge. Colonic diverticulosis without evidence for acute diverticulitis." -IV Morphine was initiated by night coverage at 2 mg q1hprn but will cut back to q4hprn  -Patient has Ketorolac as well  -ID is continuing IV vancomycin and recommending follow-up with daptomycin sensitivities and MRSA is sensitive to it -Abdominal pain could be from his bruising from his enoxaparin injection   Acute Encephalopathy with Agitation, improved -Related to sepsis and likely infection -He is intermittently agitated and confused and combative -Discontinued lorazepam start the patient on Haldol 2 mg every 6 as needed -Place on Delirium Precautions -MRI Brain done and showed "Severely motion degraded examination without evidence of acute intracranial abnormality. Mild-to-moderate chronic small vessel ischemic disease."  -Continue to monitor -Added 25 mg of quetiapine p.o. nightly and this is improved his mentation -Could be related to pain as his mentation improved with better pain control -Wrist restraints have been removed  -ID recommending repeating MRI at the end of this week if he continues with persistent worsening headache or other CNS symptoms -We will place on delirium precautions   Hypertension -Currently on amlodipine and losartan and being continued on both of those with amlodipine 5 mg p.o. daily and losartan 50 mg p.o. daily -Holding hydrochlorothiazide since it is likely following completed -Blood pressure likely further elevated due to pain -Continue to Monitor BP per Protocol -Last BP reading was 147/73   Hyperlipidemia -Continued on Atorvastatin 20 g p.o. daily but will hold given his abnormal LFTs now   Sore throat -Noted on CT scan to have retropharyngeal erythema.  No comment of obvious abscess  no CT -EDP discussed with ENT who recommended conservative management with IV antibiotics -Was also recommended that he receive Decadron 8 mg every 8 hours x 3 doses; has completed his Decadron doses -If symptoms worsen over the next 24 to 48 hours, recommendations are for reimaging but did not complain of Sore Throat now -Continue to Monitor closely    Asthma -Currently no wheezing -Continue on bronchodilators, Dulera -Continue to Monitor Respiratory Status carefully -SpO2: 95 % O2 Flow Rate (L/min): 2 L/min  Hyponatremia, improving -Patient's sodium went from 131 -> 130 -> 131 x2 -> 134 -> 130 -> 132 -Continued with IV fluid hydration as above and have now resumed at 25 MLS per hour -Repeat CMP in the a.m.  Abnormal LFTs -Mild and likely reactive -AST went from 20 -> 33 -> 45 -> 56 -ALT went from 26 -> 32 -> 48 -> 58 -Had CT Scan of the Abd/Pelvis as above -Holding statin as above and will check CK -Continue to Monitor and  trend hepatic function panel and if continues to be elevated will obtain a right upper quadrant ultrasound as well as an acute hepatitis panel -Repeat CMP in the AM   Hyperbilirubinemia -Improving and likely reactive -T bili went from 2.4 -> 2.5 -> 2.4 -> 1.8 -> 1.2 -Continue to monitor and trend -Repeat CMP in the a.m.  Normocytic Anemia -Patient's hemoglobin/hematocrit went from 11.2/32.2 -> 11.9/34.0 -> 12.7/36.8 -> 10.7/31.0 -> 11.2/33.3 ->  9.6/27.8 -> 9.3/27.1 -EMEA panel was checked and showed an iron level of 37, U IBC 116, TIBC 153, saturation ratios of 24, ferritin level 993, folate of 10.5, and vitamin B12 894 -We will start p.o. Niferex -Continue to monitor for signs and symptoms of bleeding; currently no overt bleeding noted -Repeat CBC in a.m.  Obesity -Complicates overall prognosis and care -Estimated body mass index is 33.91 kg/m as calculated from the following:   Height as of this encounter: 6' (1.829 m).   Weight as of this  encounter: 113.4 kg. -Weight loss and Dietary Counseling given   DVT prophylaxis: Enoxaparin Code Status: DO NOT RESUSCITATE Family Communication: No family currently at bedside but I spoke with his Sister over the phone yesterday  Disposition Plan: Pending further clinical improvement and clearance by ID and further workup for his MRSA bacteremia  Status is: Inpatient  Remains inpatient appropriate because: Remains somewhat confused and encephalopathic we will need ID clearance and antibiotic recommendations prior to safe discharge disposition   Consultants:  Infectious Diseases  Procedures:  ECHOCARDIOGRAM IMPRESSIONS     1. Left ventricular ejection fraction, by estimation, is 55 to 60%. The  left ventricle has normal function. The left ventricle has no regional  wall motion abnormalities. There is mild concentric left ventricular  hypertrophy. Left ventricular diastolic  parameters are consistent with Grade I diastolic dysfunction (impaired  relaxation).   2. Right ventricular systolic function is normal. The right ventricular  size is normal.   3. The mitral valve is normal in structure. No evidence of mitral valve  regurgitation. No evidence of mitral stenosis.   4. The aortic valve is normal in structure. Aortic valve regurgitation is  not visualized. No aortic stenosis is present.   5. There is borderline dilatation of the ascending aorta, measuring 36  mm.   6. The inferior vena cava is normal in size with greater than 50%  respiratory variability, suggesting right atrial pressure of 3 mmHg.   Comparison(s): No prior Echocardiogram.   FINDINGS   Left Ventricle: Left ventricular ejection fraction, by estimation, is 55  to 60%. The left ventricle has normal function. The left ventricle has no  regional wall motion abnormalities. Definity contrast agent was given IV  to delineate the left ventricular   endocardial borders. The left ventricular internal cavity size  was normal  in size. There is mild concentric left ventricular hypertrophy. Left  ventricular diastolic parameters are consistent with Grade I diastolic  dysfunction (impaired relaxation).   Right Ventricle: The right ventricular size is normal. No increase in  right ventricular wall thickness. Right ventricular systolic function is  normal.   Left Atrium: Left atrial size was normal in size.   Right Atrium: Right atrial size was normal in size.   Pericardium: There is no evidence of pericardial effusion. Presence of  epicardial fat layer.   Mitral Valve: The mitral valve is normal in structure. No evidence of  mitral valve regurgitation. No evidence of mitral valve stenosis. MV peak  gradient, 11.7 mmHg. The mean mitral valve gradient is 5.0 mmHg.   Tricuspid Valve: The tricuspid valve is normal in structure. Tricuspid  valve regurgitation is not demonstrated. No evidence of tricuspid  stenosis.   Aortic Valve: The aortic valve is normal in structure. Aortic valve  regurgitation is not visualized. No aortic stenosis is present.   Pulmonic Valve: The pulmonic valve was normal in structure. Pulmonic valve  regurgitation is not visualized. No evidence of pulmonic stenosis.  Aorta: There is borderline dilatation of the ascending aorta, measuring 36  mm.   Venous: The inferior vena cava is normal in size with greater than 50%  respiratory variability, suggesting right atrial pressure of 3 mmHg.   IAS/Shunts: No atrial level shunt detected by color flow Doppler.      LEFT VENTRICLE  PLAX 2D  LVIDd:         4.50 cm      Diastology  LVIDs:         3.60 cm      LV e' medial:    19.00 cm/s  LV PW:         1.07 cm      LV E/e' medial:  8.2  LV IVS:        1.04 cm      LV e' lateral:   17.90 cm/s  LVOT diam:     2.10 cm      LV E/e' lateral: 8.7  LV SV:         95  LV SV Index:   40  LVOT Area:     3.46 cm     LV Volumes (MOD)  LV vol d, MOD A2C: 104.2 ml  LV vol d, MOD  A4C: 112.0 ml  LV vol s, MOD A2C: 45.8 ml  LV vol s, MOD A4C: 54.9 ml  LV SV MOD A2C:     58.3 ml  LV SV MOD A4C:     112.0 ml  LV SV MOD BP:      57.0 ml   RIGHT VENTRICLE            IVC  RV S prime:     9.36 cm/s  IVC diam: 2.60 cm  TAPSE (M-mode): 1.4 cm   LEFT ATRIUM             Index        RIGHT ATRIUM           Index  LA diam:        3.20 cm 1.37 cm/m   RA Area:     11.50 cm  LA Vol (A2C):   73.4 ml 31.33 ml/m  RA Volume:   24.70 ml  10.54 ml/m  LA Vol (A4C):   26.5 ml 11.31 ml/m  LA Biplane Vol: 45.0 ml 19.21 ml/m   AORTIC VALVE  LVOT Vmax:   141.00 cm/s  LVOT Vmean:  94.600 cm/s  LVOT VTI:    0.273 m     AORTA  Ao Root diam: 3.80 cm  Ao Asc diam:  3.60 cm   MITRAL VALVE  MV Area (PHT): 3.60 cm     SHUNTS  MV Area VTI:   3.25 cm     Systemic VTI:  0.27 m  MV Peak grad:  11.7 mmHg    Systemic Diam: 2.10 cm  MV Mean grad:  5.0 mmHg  MV Vmax:       1.71 m/s  MV Vmean:      101.0 cm/s  MV Decel Time: 211 msec  MV E velocity: 155.00 cm/s   Antimicrobials:  Anti-infectives (From admission, onward)    Start     Dose/Rate Route Frequency Ordered Stop   11/09/21 1515  ceftaroline (TEFLARO) 600 mg in sodium chloride 0.9 % 100 mL IVPB        600 mg 100 mL/hr over 60 Minutes Intravenous Every 8 hours 11/09/21 1418     11/09/21 1230  DAPTOmycin (  CUBICIN) 750 mg in sodium chloride 0.9 % IVPB        8 mg/kg  91.9 kg (Adjusted) 130 mL/hr over 30 Minutes Intravenous Daily 11/09/21 1139     11/06/21 2200  vancomycin (VANCOREADY) IVPB 1500 mg/300 mL  Status:  Discontinued        1,500 mg 150 mL/hr over 120 Minutes Intravenous Every 12 hours 11/06/21 1223 11/09/21 1128   11/03/21 1400  ceFEPIme (MAXIPIME) 2 g in sodium chloride 0.9 % 100 mL IVPB  Status:  Discontinued        2 g 200 mL/hr over 30 Minutes Intravenous Every 8 hours 11/03/21 1048 11/03/21 1119   11/03/21 1200  ampicillin (OMNIPEN) 1 g in sodium chloride 0.9 % 100 mL IVPB  Status:  Discontinued        1  g 300 mL/hr over 20 Minutes Intravenous Every 6 hours 11/03/21 1041 11/03/21 1119   11/03/21 0600  metroNIDAZOLE (FLAGYL) IVPB 500 mg  Status:  Discontinued        500 mg 100 mL/hr over 60 Minutes Intravenous Every 12 hours 11/02/21 2031 11/02/21 2248   11/03/21 0200  ceFEPIme (MAXIPIME) 2 g in sodium chloride 0.9 % 100 mL IVPB  Status:  Discontinued        2 g 200 mL/hr over 30 Minutes Intravenous Every 8 hours 11/02/21 2101 11/03/21 0445   11/03/21 0100  acyclovir (ZOVIRAX) 775 mg in dextrose 5 % 100 mL IVPB  Status:  Discontinued        10 mg/kg  77.6 kg (Ideal) 115.5 mL/hr over 60 Minutes Intravenous Every 8 hours 11/02/21 2308 11/03/21 0445   11/03/21 0000  ampicillin (OMNIPEN) 2 g in sodium chloride 0.9 % 100 mL IVPB  Status:  Discontinued        2 g 300 mL/hr over 20 Minutes Intravenous Every 4 hours 11/02/21 2308 11/03/21 0445   11/02/21 2200  vancomycin (VANCOREADY) IVPB 1000 mg/200 mL  Status:  Discontinued        1,000 mg 200 mL/hr over 60 Minutes Intravenous Every 12 hours 11/02/21 2101 11/06/21 1223   11/02/21 1700  ceFEPIme (MAXIPIME) 2 g in sodium chloride 0.9 % 100 mL IVPB        2 g 200 mL/hr over 30 Minutes Intravenous  Once 11/02/21 1646 11/02/21 1805   11/02/21 1700  metroNIDAZOLE (FLAGYL) IVPB 500 mg        500 mg 100 mL/hr over 60 Minutes Intravenous  Once 11/02/21 1646 11/02/21 2015   11/02/21 1700  vancomycin (VANCOCIN) IVPB 1000 mg/200 mL premix        1,000 mg 200 mL/hr over 60 Minutes Intravenous  Once 11/02/21 1646 11/02/21 2130       Subjective: Seen and examined at bedside and complaining about significant pain in his nerves.  No nausea or vomiting.  Continues to have a lot of back pain and neck pain.  States that his abdominal pain is not as bad.  No other concerns or complaints time.  Objective: Vitals:   11/09/21 2210 11/10/21 0205 11/10/21 0547 11/10/21 0936  BP: (!) 161/67 127/66 (!) 147/73   Pulse: 100 96 92   Resp: 18 16 16    Temp: 99.3 F  (37.4 C) 99 F (37.2 C) 97.7 F (36.5 C)   TempSrc: Oral  Oral   SpO2:  96% 98% 95%  Weight:      Height:        Intake/Output Summary (Last 24 hours) at  11/10/2021 1219 Last data filed at 11/10/2021 0900 Gross per 24 hour  Intake 859.63 ml  Output 550 ml  Net 309.63 ml    Filed Weights   11/02/21 0701  Weight: 113.4 kg   Examination: Physical Exam:  Constitutional: WN/WD obese Caucasian male in NAD appears calm but uncomfortable Eyes: Lids and conjunctivae normal, sclerae anicteric  ENMT: External Ears, Nose appear normal. Grossly normal hearing. Mucous membranes are moist. Neck: Appears normal, supple, no cervical masses, normal ROM, no appreciable thyromegaly; no JVD Respiratory: Diminished to auscultation bilaterally, no wheezing, rales, rhonchi or crackles. Normal respiratory effort and patient is not tachypenic. No accessory muscle use. Unlabored breathing  Cardiovascular: Slightly Tachycardic, no murmurs / rubs / gallops. S1 and S2 auscultated. Mild LE Edema Abdomen: Soft, mildly-tender, Distended 2/2 body habitus. Bowel sounds positive.  GU: Deferred. Musculoskeletal: No clubbing / cyanosis of digits/nails. No joint deformity upper and lower extremities.  Skin: No rashes, lesions, ulcers on a limited skin evaluation. No induration; Warm and dry.  Neurologic: CN 2-12 grossly intact with no focal deficits.  Romberg sign and cerebellar reflexes not assessed.  Psychiatric: Normal judgment and insight. Alert and oriented x 3. Normal mood and appropriate affect.   Data Reviewed: I have personally reviewed following labs and imaging studies  CBC: Recent Labs  Lab 11/06/21 0100 11/07/21 0419 11/08/21 0451 11/09/21 0623 11/10/21 0431  WBC 16.9* 13.7* 14.3* 11.2* 9.5  NEUTROABS 14.3* 11.4* 12.4* 9.5* 7.7  HGB 12.7* 10.7* 11.2* 9.6* 9.3*  HCT 36.8* 31.0* 33.3* 27.8* 27.1*  MCV 87.4 88.6 90.2 86.6 89.7  PLT 218 198 229 218 272    Basic Metabolic Panel: Recent Labs   Lab 11/06/21 0100 11/07/21 0419 11/08/21 0451 11/09/21 0623 11/10/21 0431  NA 131* 131* 134* 130* 132*  K 3.9 3.7 3.9 3.7 3.3*  CL 94* 99 98 98 99  CO2 24 24 24 25 26   GLUCOSE 110* 136* 125* 137* 137*  BUN 24* 27* 24* 19 19  CREATININE 0.76 0.75 0.63 0.54* 0.64  CALCIUM 9.0 8.3* 8.8* 8.6* 8.3*  MG 1.8 2.3 2.1 1.8 2.1  PHOS 3.4 3.9 3.2 2.8 3.2    GFR: Estimated Creatinine Clearance: 111.7 mL/min (by C-G formula based on SCr of 0.64 mg/dL). Liver Function Tests: Recent Labs  Lab 11/06/21 0100 11/07/21 0419 11/08/21 0451 11/09/21 0623 11/10/21 0431  AST 23 20 33 45* 56*  ALT 27 26 32 48* 58*  ALKPHOS 68 54 68 73 72  BILITOT 2.4* 2.5* 2.4* 1.8* 1.2  PROT 7.7 6.5 7.4 6.9 6.6  ALBUMIN 2.7* 2.3* 2.6* 2.3* 2.1*    No results for input(s): LIPASE, AMYLASE in the last 168 hours. No results for input(s): AMMONIA in the last 168 hours. Coagulation Profile: No results for input(s): INR, PROTIME in the last 168 hours.  Cardiac Enzymes: Recent Labs  Lab 11/09/21 1310  CKTOTAL 122    BNP (last 3 results) No results for input(s): PROBNP in the last 8760 hours. HbA1C: No results for input(s): HGBA1C in the last 72 hours. CBG: Recent Labs  Lab 11/05/21 1626  GLUCAP 133*    Lipid Profile: No results for input(s): CHOL, HDL, LDLCALC, TRIG, CHOLHDL, LDLDIRECT in the last 72 hours. Thyroid Function Tests: No results for input(s): TSH, T4TOTAL, FREET4, T3FREE, THYROIDAB in the last 72 hours. Anemia Panel: Recent Labs    11/09/21 0623  VITAMINB12 894  FOLATE 10.5  FERRITIN 993*  TIBC 153*  IRON 37*  RETICCTPCT 1.3  Sepsis Labs: No results for input(s): PROCALCITON, LATICACIDVEN in the last 168 hours.   Recent Results (from the past 240 hour(s))  Blood culture (routine x 2)     Status: Abnormal   Collection Time: 11/02/21 12:12 PM   Specimen: BLOOD  Result Value Ref Range Status   Specimen Description   Final    BLOOD RIGHT ANTECUBITAL Performed at  [facility] [address] [place], [place] [address]    Special Requests   Final    BOTTLES DRAWN AEROBIC AND ANAEROBIC Blood Culture results may not be optimal due to an excessive volume of blood received in culture bottles Performed at [facility] [address], [place], [place] [address]    Culture  Setup Time   Final    GRAM POSITIVE COCCI IN CLUSTERS IN BOTH AEROBIC AND ANAEROBIC BOTTLES CRITICAL RESULT CALLED TO, READ BACK BY AND VERIFIED WITH: M LILLISTON,PHARMD@[id number]  11/03/21 [place]    Culture (A)  Final    METHICILLIN RESISTANT STAPHYLOCOCCUS AUREUS SEE SEPARATE REPORT Performed at [facility], [address], [place], [place] [address]    Report Status 11/09/2021 FINAL  Final   Organism ID, Bacteria METHICILLIN RESISTANT STAPHYLOCOCCUS AUREUS  Final      Susceptibility   Methicillin resistant staphylococcus aureus - MIC*    CIPROFLOXACIN >=8 RESISTANT Resistant     ERYTHROMYCIN >=8 RESISTANT Resistant     GENTAMICIN <=0.5 SENSITIVE Sensitive     OXACILLIN >=4 RESISTANT Resistant     TETRACYCLINE <=1 SENSITIVE Sensitive     VANCOMYCIN 1 SENSITIVE Sensitive     TRIMETH/SULFA <=10 SENSITIVE Sensitive     CLINDAMYCIN <=0.25 SENSITIVE Sensitive     RIFAMPIN <=0.5 SENSITIVE Sensitive     Inducible Clindamycin NEGATIVE Sensitive     * METHICILLIN RESISTANT STAPHYLOCOCCUS AUREUS  Blood Culture ID Panel (Reflexed)     Status: Abnormal   Collection Time: 11/02/21 12:12 PM  Result Value Ref Range Status   Enterococcus faecalis NOT DETECTED NOT DETECTED Final   Enterococcus Faecium NOT DETECTED NOT DETECTED Final   Listeria monocytogenes NOT DETECTED NOT DETECTED Final   Staphylococcus species DETECTED (A) NOT DETECTED Final    Comment: CRITICAL RESULT CALLED TO, READ BACK BY AND VERIFIED WITH: M LILLISTON,PHARMD@0431  11/03/21 [place]    Staphylococcus aureus (BCID) DETECTED (A) NOT DETECTED Final    Comment: Methicillin  (oxacillin)-resistant Staphylococcus aureus (MRSA). MRSA is predictably resistant to beta-lactam antibiotics (except ceftaroline). Preferred therapy is vancomycin unless clinically contraindicated. Patient requires contact precautions if  hospitalized. CRITICAL RESULT CALLED TO, READ BACK BY AND VERIFIED WITH: M LILLISTON,PHARMD@0431  11/03/21 [place]    Staphylococcus epidermidis NOT DETECTED NOT DETECTED Final   Staphylococcus lugdunensis NOT DETECTED NOT DETECTED Final   Streptococcus species NOT DETECTED NOT DETECTED Final   Streptococcus agalactiae NOT DETECTED NOT DETECTED Final   Streptococcus pneumoniae NOT DETECTED NOT DETECTED Final   Streptococcus pyogenes NOT DETECTED NOT DETECTED Final   A.calcoaceticus-baumannii NOT DETECTED NOT DETECTED Final   Bacteroides fragilis NOT DETECTED NOT DETECTED Final   Enterobacterales NOT DETECTED NOT DETECTED Final   Enterobacter cloacae complex NOT DETECTED NOT DETECTED Final   Escherichia coli NOT DETECTED NOT DETECTED Final   Klebsiella aerogenes NOT DETECTED NOT DETECTED Final   Klebsiella oxytoca NOT DETECTED NOT DETECTED Final   Klebsiella pneumoniae NOT DETECTED NOT DETECTED Final   Proteus species NOT DETECTED NOT DETECTED Final   Salmonella species NOT DETECTED NOT DETECTED Final   Serratia marcescens  NOT DETECTED NOT DETECTED Final   Haemophilus influenzae NOT DETECTED NOT DETECTED Final   Neisseria meningitidis NOT DETECTED NOT DETECTED Final   Pseudomonas aeruginosa NOT DETECTED NOT DETECTED Final   Stenotrophomonas maltophilia NOT DETECTED NOT DETECTED Final   Candida albicans NOT DETECTED NOT DETECTED Final   Candida auris NOT DETECTED NOT DETECTED Final   Candida glabrata NOT DETECTED NOT DETECTED Final   Candida krusei NOT DETECTED NOT DETECTED Final   Candida parapsilosis NOT DETECTED NOT DETECTED Final   Candida tropicalis NOT DETECTED NOT DETECTED Final   Cryptococcus neoformans/gattii NOT DETECTED NOT DETECTED Final   Meth  resistant mecA/C and MREJ DETECTED (A) NOT DETECTED Final    Comment: CRITICAL RESULT CALLED TO, READ BACK BY AND VERIFIED WITH: M LILLISTON,PHARMD@0431  11/03/21 [place] Performed at [facility], [address], [place], [place] [address]   Blood culture (routine x 2)     Status: Abnormal   Collection Time: 11/02/21 12:17 PM   Specimen: BLOOD  Result Value Ref Range Status   Specimen Description   Final    BLOOD LEFT ANTECUBITAL Performed at [facility] [address], [place], [place] [address]    Special Requests   Final    BOTTLES DRAWN AEROBIC AND ANAEROBIC Blood Culture adequate volume Performed at [facility] [address], [place], [place] [address]    Culture  Setup Time   Final    GRAM POSITIVE COCCI IN CLUSTERS IN BOTH AEROBIC AND ANAEROBIC BOTTLES CRITICAL VALUE NOTED.  VALUE IS CONSISTENT WITH PREVIOUSLY REPORTED AND CALLED VALUE.    Culture (A)  Final    STAPHYLOCOCCUS AUREUS SUSCEPTIBILITIES PERFORMED ON PREVIOUS CULTURE WITHIN THE LAST 5 DAYS. Performed at [facility], [place] [address], [place], [place] [id number]    Report Status 11/05/2021 FINAL  Final  Resp Panel by RT-PCR (Flu A&B, Covid) Nasopharyngeal Swab     Status: None   Collection Time: 11/02/21  4:16 PM   Specimen: Nasopharyngeal Swab; Nasopharyngeal(NP) swabs in vial transport medium  Result Value Ref Range Status   SARS Coronavirus 2 by RT PCR NEGATIVE NEGATIVE Final    Comment: (NOTE) SARS-CoV-2 target nucleic acids are NOT DETECTED.  The SARS-CoV-2 RNA is generally detectable in upper respiratory specimens during the acute phase of infection. The lowest concentration of SARS-CoV-2 viral copies this assay can detect is 138 copies/mL. A negative result does not preclude SARS-Cov-2 infection and should not be used as the sole basis for treatment or other patient management decisions. A negative result may occur with  improper specimen  collection/handling, submission of specimen other than nasopharyngeal swab, presence of viral mutation(s) within the areas targeted by this assay, and inadequate number of viral copies(<138 copies/mL). A negative result must be combined with clinical observations, patient history, and epidemiological information. The expected result is Negative.  Fact Sheet for Patients:  EntrepreneurPulse.com.au  Fact Sheet for Healthcare Providers:  IncredibleEmployment.be  This test is no t yet approved or cleared by the [place] FDA and  has been authorized for detection and/or diagnosis of SARS-CoV-2 by FDA under an Emergency Use Authorization (EUA). This EUA will remain  in effect (meaning this test can be used) for the duration of the COVID-19 declaration under Section 564(b)(1) of the Act, 21 U.S.C.section 360bbb-3(b)(1), unless the authorization is terminated  or revoked sooner.       Influenza A by PCR NEGATIVE NEGATIVE Final   Influenza B by PCR NEGATIVE NEGATIVE  Final    Comment: (NOTE) The Xpert Xpress SARS-CoV-2/FLU/RSV plus assay is intended as an aid in the diagnosis of influenza from Nasopharyngeal swab specimens and should not be used as a sole basis for treatment. Nasal washings and aspirates are unacceptable for Xpert Xpress SARS-CoV-2/FLU/RSV testing.  Fact Sheet for Patients: EntrepreneurPulse.com.au  Fact Sheet for Healthcare Providers: IncredibleEmployment.be  This test is not yet approved or cleared by the [place] FDA and has been authorized for detection and/or diagnosis of SARS-CoV-2 by FDA under an Emergency Use Authorization (EUA). This EUA will remain in effect (meaning this test can be used) for the duration of the COVID-19 declaration under Section 564(b)(1) of the Act, 21 U.S.C. section 360bbb-3(b)(1), unless the authorization is terminated or revoked.  Performed at [facility], [place] [address] [place], [place] [address]   Urine Culture     Status: Abnormal   Collection Time: 11/02/21  4:33 PM   Specimen: In/Out Cath Urine  Result Value Ref Range Status   Specimen Description   Final    IN/OUT CATH URINE Performed at [facility] [address] [place], [place] [address]    Special Requests   Final    NONE Performed at [facility], [place] [address], [place], [place] [address]    Culture (A)  Final    >=100,000 COLONIES/mL METHICILLIN RESISTANT STAPHYLOCOCCUS AUREUS   Report Status 11/05/2021 FINAL  Final   Organism ID, Bacteria METHICILLIN RESISTANT STAPHYLOCOCCUS AUREUS (A)  Final      Susceptibility   Methicillin resistant staphylococcus aureus - MIC*    CIPROFLOXACIN >=8 RESISTANT Resistant     GENTAMICIN <=0.5 SENSITIVE Sensitive     NITROFURANTOIN <=16 SENSITIVE Sensitive     OXACILLIN >=4 RESISTANT Resistant     TETRACYCLINE <=1 SENSITIVE Sensitive     VANCOMYCIN 1 SENSITIVE Sensitive     TRIMETH/SULFA <=10 SENSITIVE Sensitive     CLINDAMYCIN <=0.25 SENSITIVE Sensitive     RIFAMPIN <=0.5 SENSITIVE Sensitive     Inducible Clindamycin NEGATIVE Sensitive     * >=100,000 COLONIES/mL METHICILLIN RESISTANT STAPHYLOCOCCUS AUREUS  Culture, blood (routine x 2)     Status: None   Collection Time: 11/04/21  5:19 AM   Specimen: BLOOD RIGHT HAND  Result Value Ref Range Status   Specimen Description   Final    BLOOD RIGHT HAND Performed at [facility], [address], [place], [place] [address]    Special Requests   Final    BOTTLES DRAWN AEROBIC ONLY Blood Culture adequate volume Performed at [facility] [address], [place], [place] [address]    Culture   Final    NO GROWTH 5 DAYS Performed at [facility], [place] [address], [place], [place] [address]    Report Status 11/09/2021 FINAL  Final  Culture, blood (routine x 2)     Status: None    Collection Time: 11/04/21  5:19 AM   Specimen: BLOOD LEFT HAND  Result Value Ref Range Status   Specimen Description   Final    BLOOD LEFT HAND Performed at [facility], [place] [address], [place] [employer], [place] [address]    Special Requests   Final    BOTTLES DRAWN AEROBIC ONLY Blood Culture adequate volume Performed at [place] [address] [place],  [address]    Culture   Final    NO GROWTH 5 DAYS Performed at [facility], [place] [address], [place], [place] [address]    Report Status 11/09/2021 FINAL  Final  Culture, blood (routine x 2)     Status: None (Preliminary result)   Collection Time: 11/06/21  5:48 AM   Specimen: BLOOD RIGHT HAND  Result Value Ref Range Status   Specimen Description   Final    BLOOD RIGHT HAND Performed at [facility] [address], [place], [place] [address]    Special Requests   Final    BOTTLES DRAWN AEROBIC ONLY Blood Culture adequate volume Performed at [place] [address], [place], [place] [address]    Culture   Final    NO GROWTH 3 DAYS Performed at [facility], [place] [address], [place], [place] [address]    Report Status PENDING  Incomplete  Culture, blood (routine x 2)     Status: Abnormal   Collection Time: 11/06/21  5:52 AM   Specimen: BLOOD LEFT HAND  Result Value Ref Range Status   Specimen Description   Final    BLOOD LEFT HAND Performed at [place] [address] [place], [place] [address]    Special Requests   Final    BOTTLES DRAWN AEROBIC AND ANAEROBIC Blood Culture adequate volume Performed at [facility] [address], [place], [place] [address]    Culture  Setup Time   Final    GRAM POSITIVE COCCI ANAEROBIC BOTTLE ONLY CRITICAL VALUE NOTED.  VALUE IS CONSISTENT WITH PREVIOUSLY REPORTED AND CALLED VALUE.    Culture (A)  Final    STAPHYLOCOCCUS AUREUS SUSCEPTIBILITIES PERFORMED ON  PREVIOUS CULTURE WITHIN THE LAST 5 DAYS. Performed at [facility], [place] [address] [place], [place] [address]    Report Status 11/08/2021 FINAL  Final  Culture, blood (routine x 2)     Status: Abnormal (Preliminary result)   Collection Time: 11/07/21  9:28 AM   Specimen: BLOOD  Result Value Ref Range Status   Specimen Description   Final    BLOOD LEFT WRIST Performed at [facility] [place] [address], [place], [place] [address]    Special Requests   Final    BOTTLES DRAWN AEROBIC AND ANAEROBIC Blood Culture adequate volume Performed at [facility] [address], [place], [place] [address]    Culture  Setup Time   Final    GRAM POSITIVE COCCI ANAEROBIC BOTTLE ONLY CRITICAL VALUE NOTED.  VALUE IS CONSISTENT WITH PREVIOUSLY REPORTED AND CALLED VALUE.    Culture (A)  Final    STAPHYLOCOCCUS AUREUS SUSCEPTIBILITIES TO FOLLOW Performed at [facility], [place] [address], [place], [place] [address]    Report Status PENDING  Incomplete  Culture, blood (routine x 2)     Status: Abnormal   Collection Time: 11/07/21  9:29 AM   Specimen: BLOOD RIGHT HAND  Result Value Ref Range Status   Specimen Description   Final    BLOOD RIGHT HAND Performed at [facility] [address] [place], [place] [address]    Special Requests   Final    BOTTLES DRAWN AEROBIC ONLY Blood Culture adequate volume Performed at [facility] [address], [place],  [address]    Culture  Setup Time   Final    GRAM POSITIVE COCCI AEROBIC BOTTLE ONLY CRITICAL VALUE NOTED.  VALUE IS CONSISTENT WITH PREVIOUSLY REPORTED AND CALLED VALUE.    Culture (A)  Final    STAPHYLOCOCCUS AUREUS SUSCEPTIBILITIES PERFORMED ON PREVIOUS CULTURE WITHIN THE LAST 5 DAYS. Performed at [facility], [place] [address] [place], [place] [address]    Report Status 11/09/2021 FINAL  Final     RN Pressure Injury Documentation:      Estimated body mass index is 33.91 kg/m as calculated from the following:   Height as of this encounter: 6' (1.829 m).   Weight as of this encounter: 113.4 kg.  Malnutrition Type:   Malnutrition Characteristics:   Nutrition Interventions:     Radiology Studies: No results found.  Scheduled Meds:  albuterol  2.5 mg Nebulization TID   enoxaparin (LOVENOX) injection  60 mg Subcutaneous Q24H   gabapentin  300 mg Oral TID   losartan  50 mg Oral Daily   mometasone-formoterol  2 puff Inhalation BID   pantoprazole  40 mg Oral Daily   potassium chloride  40 mEq Oral BID   QUEtiapine  25 mg Oral QHS   sodium chloride flush  3 mL Intravenous Q12H   Continuous Infusions:  sodium chloride 10 mL/hr at 11/03/21 1032   sodium chloride 75 mL/hr at 11/09/21 1413   ceFTAROline (TEFLARO) IV 600 mg (11/10/21 0537)   DAPTOmycin (CUBICIN)  IV 750 mg (11/09/21 1415)    LOS: 8 days    Kerney Elbe, DO [facility] PAGER is on [place]  If 7PM-7AM, please contact night-coverage [email or web address]

## 2021-11-10 NOTE — Care Management Important Message (Signed)
Important Message  Patient Details IM Letter placed in Patients room. Name: Charles Weiss. MRN: [id number] Date of Birth: 1951/03/03   Medicare Important Message Given:  Yes     Kerin Salen 11/10/2021, 12:56 PM

## 2021-11-10 NOTE — Progress Notes (Signed)
Physical Therapy Treatment Patient Details Name: Charles Weiss. MRN: [id number] DOB: Nov 17, 1951 Today's Date: 11/10/2021   History of Present Illness 70 yo male presents to [facility] 11[facility]13 with neck pain, headaches x3 days, recent diagnosis of flu with admission 11/4-8. CTA head/neck shows retropharyngeal edema without visible pharyngeal or spinal source, MRI shows spine diffuse lumbar epidural enhancement without abscess; L4-5 OM changes; C5-6 prevertebral enhancement no osseous finding. workup for sepsis and mrsa along with spinal osseous involvement/discitis. PMH includes HTN, HLD, asthma, GERD.    PT Comments    Patient has improved mentation today and able to follow cues for mobility and safety. Pt less impulsive and had appropriate responses to questions. Min assist required for bed mobility and sit<>stand transfer with +2 for safety. Pt limited by back and hip pain but initiated pre-gait side stepping activities at EOB and repeat sit<>stands for LE strengthening. RN provided pain meds at EOS and pt repositioned in partway to chair position to eat lunch in bed. Acute PT will continue to progress pt as able throughout stay.    Recommendations for follow up therapy are one component of a multi-disciplinary discharge planning process, led by the attending physician.  Recommendations may be updated based on patient status, additional functional criteria and insurance authorization.  Follow Up Recommendations  Skilled nursing-short term rehab (<3 hours/day)     Assistance Recommended at Discharge Frequent or constant Supervision/Assistance  Equipment Recommendations  None recommended by PT    Recommendations for Other Services       Precautions / Restrictions Precautions Precautions: Fall Precaution Comments: monitor O2 Restrictions Weight Bearing Restrictions: No     Mobility  Bed Mobility Overal bed mobility: Needs Assistance Bed Mobility: Rolling;Sidelying to Sit;Sit  to Supine Rolling: Min assist Sidelying to sit: Min assist;HOB elevated     Sit to sidelying: Min assist General bed mobility comments: Min assist and cues for sequencing to bring LE's off EOB and press trunk up, pt required HOB elevated to ~30* to complete. Min assist to bring Bil LE's back onto bed to return to supine.    Transfers Overall transfer level: Needs assistance                      Ambulation/Gait Ambulation/Gait assistance: Supervision       Gait velocity: decr   Pre-gait activities: side stepping at EOB with 2+ for safety and min assist to steady and guide steps and walker position.     Stairs             Wheelchair Mobility    Modified Rankin (Stroke Patients Only)       Balance Overall balance assessment: Needs assistance;History of Falls Sitting-balance support: No upper extremity supported;Feet supported Sitting balance-Leahy Scale: Fair Sitting balance - Comments: pt seated EOB and managed urinal to void bladder in sitting   Standing balance support: Bilateral upper extremity supported;During functional activity Standing balance-Leahy Scale: Poor                              Cognition Arousal/Alertness: Lethargic Behavior During Therapy: Restless;Impulsive;Agitated Overall Cognitive Status: Impaired/Different from baseline Area of Impairment: Orientation;Attention;Following commands;Safety/judgement;Problem solving                 Orientation Level: Disoriented to;Place;Situation;Time Current Attention Level: Focused   Following Commands: Follows one step commands inconsistently Safety/Judgement: Decreased awareness of safety;Decreased awareness of deficits   Problem Solving:  Requires tactile cues;Requires verbal cues General Comments: patient was able to state name and town. patient unable to communicate being in hospital. patient was difficult to understand with patient becoming aggitated and combative at  EOB and unsteady standing with PT and this writter attempting to redirect.        Exercises Other Exercises Other Exercises: 5 reps sit<>stand with bil UE on RW and EOB elevated.    General Comments        Pertinent Vitals/Pain Pain Assessment: 0-10 Pain Score: 6  Pain Location: back, hip Pain Descriptors / Indicators: Sore;Discomfort;Grimacing;Moaning Pain Intervention(s): Limited activity within patient's tolerance;Monitored during session;Repositioned;Ice applied    Home Living                          Prior Function            PT Goals (current goals can now be found in the care plan section) Acute Rehab PT Goals Patient Stated Goal: return home, move to bedroom downstairs PT Goal Formulation: With patient Time For Goal Achievement: 11/19/21 Potential to Achieve Goals: Good Progress towards PT goals: Progressing toward goals    Frequency    Min 2X/week      PT Plan Current plan remains appropriate    Co-evaluation              AM-PAC PT "6 Clicks" Mobility   Outcome Measure  Help needed turning from your back to your side while in a flat bed without using bedrails?: A Little Help needed moving from lying on your back to sitting on the side of a flat bed without using bedrails?: A Little Help needed moving to and from a bed to a chair (including a wheelchair)?: A Lot Help needed standing up from a chair using your arms (e.g., wheelchair or bedside chair)?: A Little Help needed to walk in hospital room?: A Lot Help needed climbing 3-5 steps with a railing? : Total 6 Click Score: 14    End of Session Equipment Utilized During Treatment: Gait belt Activity Tolerance: Patient limited by pain;Patient tolerated treatment well Patient left: in bed;with call bell/phone within reach;with bed alarm set;with nursing/sitter in room Nurse Communication: Mobility status PT Visit Diagnosis: Unsteadiness on feet (R26.81);Other abnormalities of gait and  mobility (R26.89);Pain     Time: 2542-7062 PT Time Calculation (min) (ACUTE ONLY): 34 min  Charges:  $Therapeutic Exercise: 8-22 mins $Therapeutic Activity: 8-22 mins                     Verner Mould, DPT Acute Rehabilitation Services Office [phone] Pager [phone]    Jacques Navy 11/10/2021, 1:30 PM

## 2021-11-10 NOTE — Progress Notes (Signed)
    [facility] has been requested to perform a transesophageal echocardiogram on 11/22 for bacteremia.  After careful review of history and examination, the risks and benefits of transesophageal echocardiogram have been explained including risks of esophageal damage, perforation (1:10,000 risk), bleeding, pharyngeal hematoma as well as other potential complications associated with conscious sedation including aspiration, arrhythmia, respiratory failure and death. Alternatives to treatment were discussed, questions were answered. Patient is willing to proceed.   RN contacted and she will make sure they give him pain meds about noon. Procedure scheduled for 1 pm.   Rosaria Ferries, PA-C 11/10/2021 10:10 AM

## 2021-11-11 ENCOUNTER — Inpatient Hospital Stay (HOSPITAL_COMMUNITY): Payer: Medicare Other | Admitting: Anesthesiology

## 2021-11-11 ENCOUNTER — Inpatient Hospital Stay (HOSPITAL_COMMUNITY): Payer: Medicare Other

## 2021-11-11 ENCOUNTER — Encounter (HOSPITAL_COMMUNITY)
Admission: EM | Disposition: A | Discharge status: Skilled Nursing Facility | Payer: Self-pay | Source: Home / Self Care | Attending: Internal Medicine

## 2021-11-11 ENCOUNTER — Encounter (HOSPITAL_COMMUNITY): Payer: Self-pay | Admitting: Internal Medicine

## 2021-11-11 DIAGNOSIS — A4102 Sepsis due to Methicillin resistant Staphylococcus aureus: Secondary | ICD-10-CM | POA: Diagnosis not present

## 2021-11-11 DIAGNOSIS — Z8616 Personal history of COVID-19: Secondary | ICD-10-CM | POA: Diagnosis not present

## 2021-11-11 DIAGNOSIS — J45909 Unspecified asthma, uncomplicated: Secondary | ICD-10-CM | POA: Diagnosis not present

## 2021-11-11 DIAGNOSIS — G061 Intraspinal abscess and granuloma: Secondary | ICD-10-CM | POA: Diagnosis not present

## 2021-11-11 DIAGNOSIS — J9601 Acute respiratory failure with hypoxia: Secondary | ICD-10-CM | POA: Diagnosis not present

## 2021-11-11 DIAGNOSIS — Z20822 Contact with and (suspected) exposure to covid-19: Secondary | ICD-10-CM | POA: Diagnosis not present

## 2021-11-11 DIAGNOSIS — E785 Hyperlipidemia, unspecified: Secondary | ICD-10-CM | POA: Diagnosis not present

## 2021-11-11 LAB — MAGNESIUM: Magnesium: 1.9 mg/dL (ref 1.7–2.4)

## 2021-11-11 LAB — CBC WITH DIFFERENTIAL/PLATELET
Abs Immature Granulocytes: 0.13 10*3/uL — ABNORMAL HIGH (ref 0.00–0.07)
Basophils Absolute: 0 10*3/uL (ref 0.0–0.1)
Basophils Relative: 0 %
Eosinophils Absolute: 0.1 10*3/uL (ref 0.0–0.5)
Eosinophils Relative: 1 %
HCT: 25.4 % — ABNORMAL LOW (ref 39.0–52.0)
Hemoglobin: 8.5 g/dL — ABNORMAL LOW (ref 13.0–17.0)
Immature Granulocytes: 2 %
Lymphocytes Relative: 10 %
Lymphs Abs: 0.8 10*3/uL (ref 0.7–4.0)
MCH: 30.5 pg (ref 26.0–34.0)
MCHC: 33.5 g/dL (ref 30.0–36.0)
MCV: 91 fL (ref 80.0–100.0)
Monocytes Absolute: 0.7 10*3/uL (ref 0.1–1.0)
Monocytes Relative: 8 %
Neutro Abs: 6.9 10*3/uL (ref 1.7–7.7)
Neutrophils Relative %: 79 %
Platelets: 217 10*3/uL (ref 150–400)
RBC: 2.79 MIL/uL — ABNORMAL LOW (ref 4.22–5.81)
RDW: 15 % (ref 11.5–15.5)
WBC: 8.7 10*3/uL (ref 4.0–10.5)
nRBC: 0 % (ref 0.0–0.2)

## 2021-11-11 LAB — COMPREHENSIVE METABOLIC PANEL
ALT: 52 U/L — ABNORMAL HIGH (ref 0–44)
AST: 38 U/L (ref 15–41)
Albumin: 2.1 g/dL — ABNORMAL LOW (ref 3.5–5.0)
Alkaline Phosphatase: 70 U/L (ref 38–126)
Anion gap: 6 (ref 5–15)
BUN: 15 mg/dL (ref 8–23)
CO2: 24 mmol/L (ref 22–32)
Calcium: 8.5 mg/dL — ABNORMAL LOW (ref 8.9–10.3)
Chloride: 103 mmol/L (ref 98–111)
Creatinine, Ser: 0.61 mg/dL (ref 0.61–1.24)
GFR, Estimated: >60 mL/min (ref >60)
Glucose, Bld: 119 mg/dL — ABNORMAL HIGH (ref 70–99)
Potassium: 4.3 mmol/L (ref 3.5–5.1)
Sodium: 133 mmol/L — ABNORMAL LOW (ref 135–145)
Total Bilirubin: 0.9 mg/dL (ref 0.3–1.2)
Total Protein: 6.4 g/dL — ABNORMAL LOW (ref 6.5–8.1)

## 2021-11-11 LAB — CULTURE, BLOOD (ROUTINE X 2)
Culture: NO GROWTH
Special Requests: ADEQUATE
Special Requests: ADEQUATE

## 2021-11-11 LAB — PHOSPHORUS: Phosphorus: 3.7 mg/dL (ref 2.5–4.6)

## 2021-11-11 SURGERY — CANCELLED PROCEDURE
Anesthesia: Monitor Anesthesia Care

## 2021-11-11 MED ORDER — SODIUM CHLORIDE 0.9% FLUSH
10.0000 mL | INTRAVENOUS | Status: DC | PRN
  Administered 2021-11-14 – 2021-11-21 (×3): 10 mL

## 2021-11-11 MED ORDER — SODIUM CHLORIDE 0.9 % IV SOLN: INTRAVENOUS | Status: AC

## 2021-11-11 MED ORDER — CHLORHEXIDINE GLUCONATE CLOTH 2 % EX PADS
6.0000 | MEDICATED_PAD | Freq: Every day | CUTANEOUS | Status: DC
  Administered 2021-11-11 – 2021-11-24 (×14): 6 via TOPICAL

## 2021-11-11 MED ORDER — PROPOFOL 500 MG/50ML IV EMUL
INTRAVENOUS | Status: DC | PRN
  Administered 2021-11-11: 100 ug/kg/min via INTRAVENOUS

## 2021-11-11 MED ORDER — SODIUM CHLORIDE 0.9% FLUSH
10.0000 mL | Freq: Two times a day (BID) | INTRAVENOUS | Status: DC
  Administered 2021-11-14 – 2021-11-24 (×4): 10 mL

## 2021-11-11 NOTE — Anesthesia Preprocedure Evaluation (Signed)
Anesthesia Evaluation  Patient identified by MRN, date of birth, ID band Patient awake    Reviewed: Allergy & Precautions, H&P , NPO status , Patient's Chart, lab work & pertinent test results  Airway Mallampati: III  TM Distance: >3 FB Neck ROM: Full    Dental no notable dental hx. (+) Poor Dentition, Dental Advisory Given   Pulmonary asthma , Current Smoker and Patient abstained from smoking.,    Pulmonary exam normal breath sounds clear to auscultation       Cardiovascular hypertension, Pt. on medications  Rhythm:Regular Rate:Normal     Neuro/Psych negative neurological ROS  negative psych ROS   GI/Hepatic Neg liver ROS, GERD  Medicated,  Endo/Other  negative endocrine ROS  Renal/GU negative Renal ROS  negative genitourinary   Musculoskeletal   Abdominal   Peds  Hematology  (+) Blood dyscrasia, anemia ,   Anesthesia Other Findings   Reproductive/Obstetrics negative OB ROS                             Anesthesia Physical Anesthesia Plan  ASA: 3  Anesthesia Plan: MAC   Post-op Pain Management: Minimal or no pain anticipated   Induction: Intravenous  PONV Risk Score and Plan: 0 and Propofol infusion  Airway Management Planned: Nasal Cannula  Additional Equipment:   Intra-op Plan:   Post-operative Plan:   Informed Consent: I have reviewed the patients History and Physical, chart, labs and discussed the procedure including the risks, benefits and alternatives for the proposed anesthesia with the patient or authorized representative who has indicated his/her understanding and acceptance.   Patient has DNR.  Discussed DNR with patient and Suspend DNR.   Dental advisory given  Plan Discussed with: CRNA  Anesthesia Plan Comments:         Anesthesia Quick Evaluation

## 2021-11-11 NOTE — Progress Notes (Signed)
Discussed with Dr. Alfredia Ferguson with [facility] regarding TEE.  Patient had retropharyngeal edema on head and neck CT with no obvious abcess.  Needs to be cleared by ENT for TEE prior to proceeding.  TEE canceled for today.

## 2021-11-11 NOTE — Interval H&P Note (Signed)
History and Physical Interval Note:  11/11/2021 12:34 PM  Charles Weiss [name].  has presented today for surgery, with the diagnosis of BACTEREMIA.  The various methods of treatment have been discussed with the patient and family. After consideration of risks, benefits and other options for treatment, the patient has consented to  Procedure(s): TRANSESOPHAGEAL ECHOCARDIOGRAM (TEE) (N/A) as a surgical intervention.  The patient's history has been reviewed, patient examined, no change in status, stable for surgery.  I have reviewed the patient's chart and labs.  Questions were answered to the patient's satisfaction.     Fransico Him

## 2021-11-11 NOTE — Consult Note (Signed)
ENT CONSULT:  Reason for Consult: Retropharyngeal edema  Referring Physician:  Dr.Sheikh  HPI: Charles Weiss. is an 70 y.o. male with medical history significant for asthma, hypertension, hyperlipidemia, recent admission for acute hypoxic respiratory failure due to influenza pneumonia who presented to the ED for evaluation of neck pain and headache on 11/13.  At that time patient was noted to febrile, with leukocytosis and bacteremia.  A CTA neck demonstrated retropharyngeal edema without visible pharyngeal or spinal source.  Patient was treated with Decadron and IV antibiotics.  He subsequently had an MRI of his cervical, thoracic and lumbar spines which demonstrated discitis/osteomyelitis and spondylosis.  Infectious disease was consulted and recommended TEE if bacteremia persisted despite antibiotic treatment.  TEE was scheduled today but not performed due to concern for retropharyngeal edema.   At time of exam, patient is sitting in bed, resting comfortably.  He is currently tolerating a diet without reports odynophagia or dysphagia.  During our conversation, patient seemed to have no difficulty with head movement, however when specifically asked to rotate his head, he reported 8 out of 10 pain.   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 No pertinent surgical history.  Family History  Problem Relation Age of Onset   Hypertension Mother    Skin cancer Mother    Osteoporosis Mother    Hypertension Father     Social History:  reports that he has been smoking cigarettes. He has never used smokeless tobacco. He reports that he does not drink alcohol and does not use drugs.  Allergies:  Allergies  Allergen Reactions   Elavil [Amitriptyline]  Other (See Comments)   Tetracyclines & Related Other (See Comments)    Medications: I have reviewed the patient's current medications.  Results for orders placed or performed during the hospital encounter of 11/02/21 (from the past 48 hour(s))  CBC with Differential/Platelet     Status: Abnormal   Collection Time: 11/10/21  4:31 AM  Result Value Ref Range   WBC 9.5 4.0 - 10.5 K/uL   RBC 3.02 (L) 4.22 - 5.81 MIL/uL   Hemoglobin 9.3 (L) 13.0 - 17.0 g/dL   HCT 27.1 (L) 39.0 - 52.0 %   MCV 89.7 80.0 - 100.0 fL   MCH 30.8 26.0 - 34.0 pg   MCHC 34.3 30.0 - 36.0 g/dL   RDW 14.9 11.5 - 15.5 %   Platelets 209 150 - 400 K/uL   nRBC 0.0 0.0 - 0.2 %   Neutrophils Relative % 81 %   Neutro Abs 7.7 1.7 - 7.7 K/uL   Lymphocytes Relative 9 %   Lymphs Abs 0.8 0.7 - 4.0 K/uL   Monocytes Relative 8 %   Monocytes Absolute 0.7 0.1 - 1.0 K/uL   Eosinophils Relative 0 %   Eosinophils Absolute 0.0 0.0 - 0.5 K/uL   Basophils Relative 0 %   Basophils Absolute 0.0 0.0 - 0.1 K/uL   Immature Granulocytes 2 %   Abs Immature Granulocytes 0.17 (H) 0.00 - 0.07 K/uL    Comment: Performed at [facility], [place] [address] [place], [place] [address]  Comprehensive metabolic panel     Status: Abnormal   Collection Time:  11/10/21  4:31 AM  Result Value Ref Range   Sodium 132 (L) 135 - 145 mmol/L   Potassium 3.3 (L) 3.5 - 5.1 mmol/L   Chloride 99 98 - 111 mmol/L   CO2 26 22 - 32 mmol/L   Glucose, Bld 137 (H) 70 - 99 mg/dL    Comment: Glucose reference range applies only to samples taken after fasting for at least 8 hours.   BUN 19 8 - 23 mg/dL   Creatinine, Ser 0.64 0.61 - 1.24 mg/dL   Calcium 8.3 (L) 8.9 - 10.3 mg/dL   Total Protein 6.6 6.5 - 8.1 g/dL   Albumin 2.1 (L) 3.5 - 5.0 g/dL   AST 56 (H) 15 - 41 U/L   ALT 58 (H) 0 - 44 U/L   Alkaline Phosphatase 72 38 - 126 U/L   Total Bilirubin 1.2 0.3 - 1.2 mg/dL   GFR, Estimated >60 >60 mL/min    Comment: (NOTE) Calculated using the  CKD-EPI Creatinine Equation (2021)    Anion gap 7 5 - 15    Comment: Performed at [facility], [place] [address], [place], [place] [address]  Magnesium     Status: None   Collection Time: 11/10/21  4:31 AM  Result Value Ref Range   Magnesium 2.1 1.7 - 2.4 mg/dL    Comment: Performed at [facility], [place] [address] [place], [place] [address]  Phosphorus     Status: None   Collection Time: 11/10/21  4:31 AM  Result Value Ref Range   Phosphorus 3.2 2.5 - 4.6 mg/dL    Comment: Performed at [facility], [place] [address] [place], [place] [address]  CBC with Differential/Platelet     Status: Abnormal   Collection Time: 11/11/21  4:34 AM  Result Value Ref Range   WBC 8.7 4.0 - 10.5 K/uL   RBC 2.79 (L) 4.22 - 5.81 MIL/uL   Hemoglobin 8.5 (L) 13.0 - 17.0 g/dL   HCT 25.4 (L) 39.0 - 52.0 %   MCV 91.0 80.0 - 100.0 fL   MCH 30.5 26.0 - 34.0 pg   MCHC 33.5 30.0 - 36.0 g/dL   RDW 15.0 11.5 - 15.5 %   Platelets 217 150 - 400 K/uL   nRBC 0.0 0.0 - 0.2 %   Neutrophils Relative % 79 %   Neutro Abs 6.9 1.7 - 7.7 K/uL   Lymphocytes Relative 10 %   Lymphs Abs 0.8 0.7 - 4.0 K/uL   Monocytes Relative 8 %   Monocytes Absolute 0.7 0.1 - 1.0 K/uL   Eosinophils Relative 1 %   Eosinophils Absolute 0.1 0.0 - 0.5 K/uL   Basophils Relative 0 %   Basophils Absolute 0.0 0.0 - 0.1 K/uL   Immature Granulocytes 2 %   Abs Immature Granulocytes 0.13 (H) 0.00 - 0.07 K/uL    Comment: Performed at [facility], [place] [address], [place], [place] [address]  Comprehensive metabolic panel     Status: Abnormal   Collection Time: 11/11/21  4:34 AM  Result Value Ref Range   Sodium 133 (L) 135 - 145 mmol/L   Potassium 4.3 3.5 - 5.1 mmol/L    Comment: DELTA CHECK NOTED   Chloride 103 98 - 111 mmol/L   CO2 24 22 - 32 mmol/L   Glucose, Bld 119 (H) 70 - 99 mg/dL    Comment: Glucose reference range applies only to samples taken after fasting for at  least 8 hours.  BUN 15 8 - 23 mg/dL   Creatinine, Ser 0.61 0.61 - 1.24 mg/dL   Calcium 8.5 (L) 8.9 - 10.3 mg/dL   Total Protein 6.4 (L) 6.5 - 8.1 g/dL   Albumin 2.1 (L) 3.5 - 5.0 g/dL   AST 38 15 - 41 U/L   ALT 52 (H) 0 - 44 U/L   Alkaline Phosphatase 70 38 - 126 U/L   Total Bilirubin 0.9 0.3 - 1.2 mg/dL   GFR, Estimated >60 >60 mL/min    Comment: (NOTE) Calculated using the CKD-EPI Creatinine Equation (2021)    Anion gap 6 5 - 15    Comment: Performed at [facility], [place] [address], [place], [place] [address]  Magnesium     Status: None   Collection Time: 11/11/21  4:34 AM  Result Value Ref Range   Magnesium 1.9 1.7 - 2.4 mg/dL    Comment: Performed at [facility], [place] [address] [place], [place] [address]  Phosphorus     Status: None   Collection Time: 11/11/21  4:34 AM  Result Value Ref Range   Phosphorus 3.7 2.5 - 4.6 mg/dL    Comment: Performed at [facility], [place] [address], [place], [place] [address]    No results found.  ROS:ROS  Blood pressure (!) 147/79, pulse 87, temperature 98.3 F (36.8 C), temperature source Oral, resp. rate 16, height 6' (1.829 m), weight 112.4 kg, SpO2 96 %.  PHYSICAL EXAM: CONSTITUTIONAL: well developed, nourished, no distress and alert and oriented x 3 PULMONARY/CHEST WALL: effort normal and no stridor, no stertor, no dysphonia HENT: Head : normocephalic and atraumatic Nose: nose normal and no purulence Mouth/Throat:  Mouth: poor dentition, all remaining teeth broken with extensive decay Throat: oropharynx clear and moist Mucous membranes: normal EYES: conjunctiva normal, EOM normal and PERRL NECK: supple, trachea normal and no thyromegaly or cervical LAD.  Patient reports tenderness to palpation and pain with range of motion.  Studies Reviewed: CTA reviewed. No abscess.   Assessment/Plan: [name] [name]. Is a 70 y/o M admitted for MRSA bacteremia.  CTA neck  initially performed at time of admission demonstrated retropharyngeal edema without drainable abscess and no obvious source of infection.  Patient has been treated with broad-spectrum antibiotics and repeat blood cultures are negative.  Leukocytosis has resolved.  He is tolerating a diet without odynophagia or dysphagia.  At time of exam, patient appears very comfortable, and does not seem to have any overt difficulty with head movement until he was directly asked to rotate and flex/extend his head, at which time he reported severe pain and difficulty with movement.  He has very poor dentition, with several broken and decaying teeth.  The remainder of his exam is unremarkable.   I recommend a CT neck with contrast to reevaluate for retropharyngeal edema, as per cardiology request.  If imaging is unremarkable, can proceed with TEE as planned.  Patient would benefit from evaluation/ referral to oral surgery/DMD, as this could be a source of infection.  Thank you for allowing me to participate in the care of this patient. Please do not hesitate to contact me with any questions or concerns.   Jason Coop, [facility] Cell: [phone]   11/11/2021, 8:53 PM

## 2021-11-11 NOTE — Progress Notes (Addendum)
PROGRESS NOTE    Charles Weiss.  JHE:[id number] DOB: Jan 29, 1951 DOA: 11/02/2021 PCP: System, Provider Not In  Brief Narrative:  The patient is a 70 year old Caucasian male with a past medical history significant for but limited to asthma, hypertension as well as other comorbidities with recent admission for influenza pneumonia and respiratory failure who came back to the emergency room with neck pain and headache.  He is noted to have a high fever and upon further work-up he is found to have evidence of discitis in the lumbar vertebra.  Blood cultures were positive for MRSA.  He was started on vancomycin and ID was consulted.  He is undergoing further work-up and his mental status is a little off and he is agitated.  MRI was unrevealing.  ID recommending repeating MRI later this week if mentation has not improved.  Continues to be extremely confused and is in soft wrist restraints given that he is a danger to himself given his mentation but was a little better so restraints were removed.  Continues to have significant right-sided abdominal pain but CT scan of the abdomen pelvis done as below.  Of note he does have some bruising on his abdomen on the right side likely from his enoxaparin injection  Assessment & Plan:   Principal Problem:   Sepsis ([place]) Active Problems:   Hypertension   Hyperlipidemia   Asthma   Acute respiratory failure with hypoxia (HCC)   Vertebral osteomyelitis (HCC)   MRSA bacteremia   Retropharyngeal abscess   Right lower quadrant abdominal pain  Sepsis secondary to MRSA bacteremia -Noted to be febrile with T-max of 103.4, tachycardic heart rate in the 110s, tachypneic source of infection is discitis.  Evidence of endorgan damage includes encephalopathy related to sepsis. Now Still febrile an had a Temperature of 100.8 this AM  -Infectious disease following and recommending continuing daily evaluation for CNS symptoms and repeat MRI at the end of the week  if persistent or worsening headache or other CNS symptoms -Currently on vancomycin and ID is following but since Cx's are positive still, I spoke with Dr. Candiss Norse who recommends changing to Daptomycin/Ceftraoline which was done on 11/09/21 -Repeat blood culture still persistently Positive  -IV fluids have now stopped -WBC has gone from 30.0 -> 9.5 -Repeated blood cultures 11/07/2021 and they are still persistently Positive so will repeat Blood Cx 11/09/21 showing NGTD at 2 Days  -Will eventually need PICC line once cultures have cleared -ID is recommending 8 weeks of prolonged Abx but they feel that they may need to repeat an MRI of the spine to see if he has a developing epidural abscess if blood cultures remain persistently positive on the last draw -2D echocardiogram performed that was unremarkable but will need TEE given persistent bacteremia will obtain TEE and will be done today Tuesday 11/11/2021 **See below but TEEE Deferred by Cardiology Dr. Radford Pax given CT Scan on Admit with Retropharyngeal Edema; ENT to formally consult -We will resume IV fluid hydration with normal saline at 75 MLS per hour but it has now stopped  -PT OT recommending SNF once medically stable to be discharged   Discitis/vertebral osteomyelitis -Noted to have significant discitis and lumbar vertebral/sacrum -Also noted to have significant epidural enhancement -Continue on IV antibiotics total of 8 weeks -Discussed with infectious disease/neurology was not felt that lumbar puncture would change management at this point -Checking ECHOCardiogram as above -WBC has gone from 24.0 -> 21.4 -> 16.9 -> 13.7 and is now worsened  to 14.3 but is now improved to 11.2 -> 9.5 -> 8.7 -Repeat blood cultures x2 from 01/04/2021 showed no growth to date at 2 days and they are being repeated again on 11/06/2021 for positive and repeated blood cultures on 11/07/2021 and are still Positive so we have repeated them 11/09/21 -He continues to have  significant back and neck pain and feels like it is in his nerves -Resume Home Gabapentin 300 mg po TID    Abdominal Pain -Noted to have diffuse abdominal tenderness -Unclear if this is contributing to underlying sepsis -Check CT abdomen and showed "Presumably degenerative endplate changes at T6-L4 and L4-L5. There are mild inflammatory changes and small lymph nodes in the anterior paravertebral soft tissues at this level. If there is high clinical concern for infection, recommend MRI.  Gallbladder sludge. Colonic diverticulosis without evidence for acute diverticulitis." -IV Morphine was initiated by night coverage at 2 mg q1hprn but will cut back to q4hprn  -Patient has Ketorolac as well  -ID is continuing IV vancomycin and recommending follow-up with daptomycin sensitivities and MRSA is sensitive to it -Abdominal pain could be from his bruising from his enoxaparin injection   Acute Encephalopathy with Agitation, improved -Related to sepsis and likely infection -He is intermittently agitated and confused and combative -Discontinued lorazepam start the patient on Haldol 2 mg every 6 as needed -Place on Delirium Precautions -MRI Brain done and showed "Severely motion degraded examination without evidence of acute intracranial abnormality. Mild-to-moderate chronic small vessel ischemic disease."  -Continue to monitor -Added 25 mg of quetiapine p.o. nightly and this is improved his mentation -Could be related to pain as his mentation improved with better pain control -Wrist restraints have been removed  -ID recommending repeating MRI at the end of this week if he continues with persistent worsening headache or other CNS symptoms -We will place on delirium precautions   Hypertension -Currently on amlodipine and losartan and being continued on both of those with amlodipine 5 mg p.o. daily and losartan 50 mg p.o. daily -Holding hydrochlorothiazide since it is likely following completed -Blood  pressure likely further elevated due to pain -Continue to Monitor BP per Protocol -Last BP reading was 154/69   Hyperlipidemia -Continued on Atorvastatin 20 g p.o. daily but will hold given his abnormal LFTs now   Sore throat ** ADDENDUM 1321 -Noted on CT scan to have retropharyngeal edema.  No comment of obvious abscess no CT -EDP discussed with ENT who recommended conservative management with IV antibiotics -Was also recommended that he receive Decadron 8 mg every 8 hours x 3 doses; has completed his Decadron doses -If symptoms worsen over the next 24 to 48 hours, recommendations are for reimaging but did not complain of Sore Throat now -Cardiology reached out to me and felt that they did not feel comfortable pursuing a TEE given his retropharyngeal edema.  The requested to weigh in and clear patient prior to repeating the TEE.  I spoke with Dr. Fredric Dine of ENT who recommends repeating a CT scan with contrast of the neck and then she will evaluate the patient later. -Continue to Monitor closely    Asthma -Currently no wheezing -Continue on bronchodilators, Dulera -Continue to Monitor Respiratory Status carefully -SpO2: 100 % O2 Flow Rate (L/min): 2 L/min  Hyponatremia, improving -Patient's sodium is now 133 -Continued with IV fluid hydration as above and have now resumed at 75 MLS per hour and will stop after 1 more day -Repeat CMP in the a.m.  Abnormal LFTs -  Mild and likely reactive -AST went from 20 -> 33 -> 45 -> 56 -> 38 -ALT went from 26 -> 32 -> 48 -> 58 -> 52 -Had CT Scan of the Abd/Pelvis as above -Holding statin as above and will check CK -Continue to Monitor and  trend hepatic function panel and if continues to be elevated will obtain a right upper quadrant ultrasound as well as an acute hepatitis panel -Repeat CMP in the AM   Hyperbilirubinemia -Improving and likely reactive -T bili went from 2.4 -> 2.5 -> 2.4 -> 1.8 -> 1.2 -> 0.9 -Continue to monitor and  trend -Repeat CMP in the a.m.  Normocytic Anemia -Patient's hemoglobin/hematocrit went from 11.2/32.2 -> 11.9/34.0 -> 12.7/36.8 -> 10.7/31.0 -> 11.2/33.3 -> 9.6/27.8 -> 9.3/27.1 -> 8.5/25.4 -EMEA panel was checked and showed an iron level of 37, U IBC 116, TIBC 153, saturation ratios of 24, ferritin level 993, folate of 10.5, and vitamin B12 894 -We will start p.o. Niferex -Continue to monitor for signs and symptoms of bleeding; currently no overt bleeding noted -Repeat CBC in a.m.  Obesity -Complicates overall prognosis and care -Estimated body mass index is 33.61 kg/m as calculated from the following:   Height as of this encounter: 6' (1.829 m).   Weight as of this encounter: 112.4 kg. -Weight loss and Dietary Counseling given   DVT prophylaxis: Enoxaparin Code Status: DO NOT RESUSCITATE Family Communication: No family currently at bedside but I spoke with his Sister over the phone yesterday  Disposition Plan: Pending further clinical improvement and clearance by ID and further workup for his MRSA bacteremia  Status is: Inpatient  Remains inpatient appropriate because: Remains somewhat confused and encephalopathic we will need ID clearance and antibiotic recommendations prior to safe discharge disposition   Consultants:  Infectious Diseases Cardiology for TEE ENT  Procedures:  ECHOCARDIOGRAM IMPRESSIONS     1. Left ventricular ejection fraction, by estimation, is 55 to 60%. The  left ventricle has normal function. The left ventricle has no regional  wall motion abnormalities. There is mild concentric left ventricular  hypertrophy. Left ventricular diastolic  parameters are consistent with Grade I diastolic dysfunction (impaired  relaxation).   2. Right ventricular systolic function is normal. The right ventricular  size is normal.   3. The mitral valve is normal in structure. No evidence of mitral valve  regurgitation. No evidence of mitral stenosis.   4. The aortic  valve is normal in structure. Aortic valve regurgitation is  not visualized. No aortic stenosis is present.   5. There is borderline dilatation of the ascending aorta, measuring 36  mm.   6. The inferior vena cava is normal in size with greater than 50%  respiratory variability, suggesting right atrial pressure of 3 mmHg.   Comparison(s): No prior Echocardiogram.   FINDINGS   Left Ventricle: Left ventricular ejection fraction, by estimation, is 55  to 60%. The left ventricle has normal function. The left ventricle has no  regional wall motion abnormalities. Definity contrast agent was given IV  to delineate the left ventricular   endocardial borders. The left ventricular internal cavity size was normal  in size. There is mild concentric left ventricular hypertrophy. Left  ventricular diastolic parameters are consistent with Grade I diastolic  dysfunction (impaired relaxation).   Right Ventricle: The right ventricular size is normal. No increase in  right ventricular wall thickness. Right ventricular systolic function is  normal.   Left Atrium: Left atrial size was normal in size.  Right Atrium: Right atrial size was normal in size.   Pericardium: There is no evidence of pericardial effusion. Presence of  epicardial fat layer.   Mitral Valve: The mitral valve is normal in structure. No evidence of  mitral valve regurgitation. No evidence of mitral valve stenosis. MV peak  gradient, 11.7 mmHg. The mean mitral valve gradient is 5.0 mmHg.   Tricuspid Valve: The tricuspid valve is normal in structure. Tricuspid  valve regurgitation is not demonstrated. No evidence of tricuspid  stenosis.   Aortic Valve: The aortic valve is normal in structure. Aortic valve  regurgitation is not visualized. No aortic stenosis is present.   Pulmonic Valve: The pulmonic valve was normal in structure. Pulmonic valve  regurgitation is not visualized. No evidence of pulmonic stenosis.   Aorta: There  is borderline dilatation of the ascending aorta, measuring 36  mm.   Venous: The inferior vena cava is normal in size with greater than 50%  respiratory variability, suggesting right atrial pressure of 3 mmHg.   IAS/Shunts: No atrial level shunt detected by color flow Doppler.      LEFT VENTRICLE  PLAX 2D  LVIDd:         4.50 cm      Diastology  LVIDs:         3.60 cm      LV e' medial:    19.00 cm/s  LV PW:         1.07 cm      LV E/e' medial:  8.2  LV IVS:        1.04 cm      LV e' lateral:   17.90 cm/s  LVOT diam:     2.10 cm      LV E/e' lateral: 8.7  LV SV:         95  LV SV Index:   40  LVOT Area:     3.46 cm     LV Volumes (MOD)  LV vol d, MOD A2C: 104.2 ml  LV vol d, MOD A4C: 112.0 ml  LV vol s, MOD A2C: 45.8 ml  LV vol s, MOD A4C: 54.9 ml  LV SV MOD A2C:     58.3 ml  LV SV MOD A4C:     112.0 ml  LV SV MOD BP:      57.0 ml   RIGHT VENTRICLE            IVC  RV S prime:     9.36 cm/s  IVC diam: 2.60 cm  TAPSE (M-mode): 1.4 cm   LEFT ATRIUM             Index        RIGHT ATRIUM           Index  LA diam:        3.20 cm 1.37 cm/m   RA Area:     11.50 cm  LA Vol (A2C):   73.4 ml 31.33 ml/m  RA Volume:   24.70 ml  10.54 ml/m  LA Vol (A4C):   26.5 ml 11.31 ml/m  LA Biplane Vol: 45.0 ml 19.21 ml/m   AORTIC VALVE  LVOT Vmax:   141.00 cm/s  LVOT Vmean:  94.600 cm/s  LVOT VTI:    0.273 m     AORTA  Ao Root diam: 3.80 cm  Ao Asc diam:  3.60 cm   MITRAL VALVE  MV Area (PHT): 3.60 cm     SHUNTS  MV  Area VTI:   3.25 cm     Systemic VTI:  0.27 m  MV Peak grad:  11.7 mmHg    Systemic Diam: 2.10 cm  MV Mean grad:  5.0 mmHg  MV Vmax:       1.71 m/s  MV Vmean:      101.0 cm/s  MV Decel Time: 211 msec  MV E velocity: 155.00 cm/s   Antimicrobials:  Anti-infectives (From admission, onward)    Start     Dose/Rate Route Frequency Ordered Stop   11/09/21 1515  [MAR Hold]  ceftaroline (TEFLARO) 600 mg in sodium chloride 0.9 % 100 mL IVPB        (MAR Hold since Tue  11/11/2021 at 1203.Hold Reason: Transfer to a Procedural area)   600 mg 100 mL/hr over 60 Minutes Intravenous Every 8 hours 11/09/21 1418     11/09/21 1230  [MAR Hold]  DAPTOmycin (CUBICIN) 750 mg in sodium chloride 0.9 % IVPB        (MAR Hold since Tue 11/11/2021 at 1203.Hold Reason: Transfer to a Procedural area)   8 mg/kg  91.9 kg (Adjusted) 130 mL/hr over 30 Minutes Intravenous Daily 11/09/21 1139     11/06/21 2200  vancomycin (VANCOREADY) IVPB 1500 mg/300 mL  Status:  Discontinued        1,500 mg 150 mL/hr over 120 Minutes Intravenous Every 12 hours 11/06/21 1223 11/09/21 1128   11/03/21 1400  ceFEPIme (MAXIPIME) 2 g in sodium chloride 0.9 % 100 mL IVPB  Status:  Discontinued        2 g 200 mL/hr over 30 Minutes Intravenous Every 8 hours 11/03/21 1048 11/03/21 1119   11/03/21 1200  ampicillin (OMNIPEN) 1 g in sodium chloride 0.9 % 100 mL IVPB  Status:  Discontinued        1 g 300 mL/hr over 20 Minutes Intravenous Every 6 hours 11/03/21 1041 11/03/21 1119   11/03/21 0600  metroNIDAZOLE (FLAGYL) IVPB 500 mg  Status:  Discontinued        500 mg 100 mL/hr over 60 Minutes Intravenous Every 12 hours 11/02/21 2031 11/02/21 2248   11/03/21 0200  ceFEPIme (MAXIPIME) 2 g in sodium chloride 0.9 % 100 mL IVPB  Status:  Discontinued        2 g 200 mL/hr over 30 Minutes Intravenous Every 8 hours 11/02/21 2101 11/03/21 0445   11/03/21 0100  acyclovir (ZOVIRAX) 775 mg in dextrose 5 % 100 mL IVPB  Status:  Discontinued        10 mg/kg  77.6 kg (Ideal) 115.5 mL/hr over 60 Minutes Intravenous Every 8 hours 11/02/21 2308 11/03/21 0445   11/03/21 0000  ampicillin (OMNIPEN) 2 g in sodium chloride 0.9 % 100 mL IVPB  Status:  Discontinued        2 g 300 mL/hr over 20 Minutes Intravenous Every 4 hours 11/02/21 2308 11/03/21 0445   11/02/21 2200  vancomycin (VANCOREADY) IVPB 1000 mg/200 mL  Status:  Discontinued        1,000 mg 200 mL/hr over 60 Minutes Intravenous Every 12 hours 11/02/21 2101 11/06/21  1223   11/02/21 1700  ceFEPIme (MAXIPIME) 2 g in sodium chloride 0.9 % 100 mL IVPB        2 g 200 mL/hr over 30 Minutes Intravenous  Once 11/02/21 1646 11/02/21 1805   11/02/21 1700  metroNIDAZOLE (FLAGYL) IVPB 500 mg        500 mg 100 mL/hr over 60 Minutes Intravenous  Once 11/02/21  1646 11/02/21 2015   11/02/21 1700  vancomycin (VANCOCIN) IVPB 1000 mg/200 mL premix        1,000 mg 200 mL/hr over 60 Minutes Intravenous  Once 11/02/21 1646 11/02/21 2130       Subjective: Seen and examined at bedside and he thinks he is doing little bit better today and not complaining of as much pain.  Understands he is going for TEE later this afternoon.  No nausea or vomiting.  LFTs are slowly improving.  No other concerns or complaints at this time and he feels overall little bit better.  Objective: Vitals:   11/10/21 1958 11/11/21 0446 11/11/21 0804 11/11/21 1212  BP: (!) 151/80 131/71  (!) 154/69  Pulse: 89 80  88  Resp: 16 16  19   Temp: 98.4 F (36.9 C) 98.1 F (36.7 C)  97.8 F (36.6 C)  TempSrc: Oral Oral  Temporal  SpO2: 97% 95% 92% 100%  Weight:    112.4 kg  Height:    6' (1.829 m)    Intake/Output Summary (Last 24 hours) at 11/11/2021 1225 Last data filed at 11/11/2021 1118 Gross per 24 hour  Intake 240 ml  Output 925 ml  Net -685 ml    Filed Weights   11/02/21 0701 11/11/21 1212  Weight: 113.4 kg 112.4 kg   Examination: Physical Exam:  Constitutional: WN/WD obese Caucasian male currently no acute distress appears calm a little bit more comfortable today and not as much pain Eyes:  Lids and conjunctivae normal, sclerae anicteric  ENMT: External Ears, Nose appear normal. Grossly normal hearing. Mucous membranes are moist. Neck: Appears normal, supple, no cervical masses, normal ROM, no appreciable thyromegaly; no appreciable JVD Respiratory: Diminished to auscultation bilaterally, no wheezing, rales, rhonchi or crackles. Normal respiratory effort and patient is not  tachypenic. No accessory muscle use.  Unlabored breathing Cardiovascular: Slightly tachycardic rate but regular rhythm, no murmurs / rubs / gallops. S1 and S2 auscultated.  Trace extremity edema Abdomen: Soft, non-tender, distended secondary body habitus. Bowel sounds positive.  Has an abdominal bruise on the right side GU: Deferred. Musculoskeletal: No clubbing / cyanosis of digits/nails. No joint deformity upper and lower extremities. Skin: No rashes, lesions, ulcers on limited skin evaluation. No induration; Warm and dry.  Neurologic: CN 2-12 grossly intact with no focal deficits. Romberg sign and cerebellar reflexes not assessed.  Psychiatric: Normal judgment and insight. Alert and oriented x 3. Normal mood and appropriate affect.   Data Reviewed: I have personally reviewed following labs and imaging studies  CBC: Recent Labs  Lab 11/07/21 0419 11/08/21 0451 11/09/21 0623 11/10/21 0431 11/11/21 0434  WBC 13.7* 14.3* 11.2* 9.5 8.7  NEUTROABS 11.4* 12.4* 9.5* 7.7 6.9  HGB 10.7* 11.2* 9.6* 9.3* 8.5*  HCT 31.0* 33.3* 27.8* 27.1* 25.4*  MCV 88.6 90.2 86.6 89.7 91.0  PLT 198 229 218 209 572    Basic Metabolic Panel: Recent Labs  Lab 11/07/21 0419 11/08/21 0451 11/09/21 0623 11/10/21 0431 11/11/21 0434  NA 131* 134* 130* 132* 133*  K 3.7 3.9 3.7 3.3* 4.3  CL 99 98 98 99 103  CO2 24 24 25 26 24   GLUCOSE 136* 125* 137* 137* 119*  BUN 27* 24* 19 19 15   CREATININE 0.75 0.63 0.54* 0.64 0.61  CALCIUM 8.3* 8.8* 8.6* 8.3* 8.5*  MG 2.3 2.1 1.8 2.1 1.9  PHOS 3.9 3.2 2.8 3.2 3.7    GFR: Estimated Creatinine Clearance: 111.2 mL/min (by C-G formula based on SCr of 0.61 mg/dL).  Liver Function Tests: Recent Labs  Lab 11/07/21 0419 11/08/21 0451 11/09/21 0623 11/10/21 0431 11/11/21 0434  AST 20 33 45* 56* 38  ALT 26 32 48* 58* 52*  ALKPHOS 54 68 73 72 70  BILITOT 2.5* 2.4* 1.8* 1.2 0.9  PROT 6.5 7.4 6.9 6.6 6.4*  ALBUMIN 2.3* 2.6* 2.3* 2.1* 2.1*    No results for  input(s): LIPASE, AMYLASE in the last 168 hours. No results for input(s): AMMONIA in the last 168 hours. Coagulation Profile: No results for input(s): INR, PROTIME in the last 168 hours.  Cardiac Enzymes: Recent Labs  Lab 11/09/21 1310  CKTOTAL 122    BNP (last 3 results) No results for input(s): PROBNP in the last 8760 hours. HbA1C: No results for input(s): HGBA1C in the last 72 hours. CBG: Recent Labs  Lab 11/05/21 1626  GLUCAP 133*    Lipid Profile: No results for input(s): CHOL, HDL, LDLCALC, TRIG, CHOLHDL, LDLDIRECT in the last 72 hours. Thyroid Function Tests: No results for input(s): TSH, T4TOTAL, FREET4, T3FREE, THYROIDAB in the last 72 hours. Anemia Panel: Recent Labs    11/09/21 0623  VITAMINB12 894  FOLATE 10.5  FERRITIN 993*  TIBC 153*  IRON 37*  RETICCTPCT 1.3    Sepsis Labs: No results for input(s): PROCALCITON, LATICACIDVEN in the last 168 hours.   Recent Results (from the past 240 hour(s))  Blood culture (routine x 2)     Status: Abnormal   Collection Time: 11/02/21 12:12 PM   Specimen: BLOOD  Result Value Ref Range Status   Specimen Description   Final    BLOOD RIGHT ANTECUBITAL Performed at [facility] [address] [place], [place] [address]    Special Requests   Final    BOTTLES DRAWN AEROBIC AND ANAEROBIC Blood Culture results may not be optimal due to an excessive volume of blood received in culture bottles Performed at [facility] [address] [place], [place] [address]    Culture  Setup Time   Final    GRAM POSITIVE COCCI IN CLUSTERS IN BOTH AEROBIC AND ANAEROBIC BOTTLES CRITICAL RESULT CALLED TO, READ BACK BY AND VERIFIED WITH: M LILLISTON,PHARMD@[id number]  11/03/21 [place]    Culture (A)  Final    METHICILLIN RESISTANT STAPHYLOCOCCUS AUREUS SEE SEPARATE REPORT Performed at [facility], [address] [place], [place] [address]    Report Status 11/09/2021 FINAL  Final   Organism ID,  Bacteria METHICILLIN RESISTANT STAPHYLOCOCCUS AUREUS  Final      Susceptibility   Methicillin resistant staphylococcus aureus - MIC*    CIPROFLOXACIN >=8 RESISTANT Resistant     ERYTHROMYCIN >=8 RESISTANT Resistant     GENTAMICIN <=0.5 SENSITIVE Sensitive     OXACILLIN >=4 RESISTANT Resistant     TETRACYCLINE <=1 SENSITIVE Sensitive     VANCOMYCIN 1 SENSITIVE Sensitive     TRIMETH/SULFA <=10 SENSITIVE Sensitive     CLINDAMYCIN <=0.25 SENSITIVE Sensitive     RIFAMPIN <=0.5 SENSITIVE Sensitive     Inducible Clindamycin NEGATIVE Sensitive     * METHICILLIN RESISTANT STAPHYLOCOCCUS AUREUS  Blood Culture ID Panel (Reflexed)     Status: Abnormal   Collection Time: 11/02/21 12:12 PM  Result Value Ref Range Status   Enterococcus faecalis NOT DETECTED NOT DETECTED Final   Enterococcus Faecium NOT DETECTED NOT DETECTED Final   Listeria monocytogenes NOT DETECTED NOT DETECTED Final   Staphylococcus species DETECTED (A) NOT DETECTED Final    Comment: CRITICAL RESULT CALLED TO, READ BACK  BY AND VERIFIED WITH: M LILLISTON,PHARMD@0431  11/03/21 [place]    Staphylococcus aureus (BCID) DETECTED (A) NOT DETECTED Final    Comment: Methicillin (oxacillin)-resistant Staphylococcus aureus (MRSA). MRSA is predictably resistant to beta-lactam antibiotics (except ceftaroline). Preferred therapy is vancomycin unless clinically contraindicated. Patient requires contact precautions if  hospitalized. CRITICAL RESULT CALLED TO, READ BACK BY AND VERIFIED WITH: M LILLISTON,[email or web address]@[email or web address]  11/03/21 [place]    Staphylococcus epidermidis NOT DETECTED NOT DETECTED Final   Staphylococcus lugdunensis NOT DETECTED NOT DETECTED Final   Streptococcus species NOT DETECTED NOT DETECTED Final   Streptococcus agalactiae NOT DETECTED NOT DETECTED Final   Streptococcus pneumoniae NOT DETECTED NOT DETECTED Final   Streptococcus pyogenes NOT DETECTED NOT DETECTED Final   A.calcoaceticus-baumannii NOT DETECTED NOT DETECTED Final   Bacteroides  fragilis NOT DETECTED NOT DETECTED Final   Enterobacterales NOT DETECTED NOT DETECTED Final   Enterobacter cloacae complex NOT DETECTED NOT DETECTED Final   Escherichia coli NOT DETECTED NOT DETECTED Final   Klebsiella aerogenes NOT DETECTED NOT DETECTED Final   Klebsiella oxytoca NOT DETECTED NOT DETECTED Final   Klebsiella pneumoniae NOT DETECTED NOT DETECTED Final   Proteus species NOT DETECTED NOT DETECTED Final   Salmonella species NOT DETECTED NOT DETECTED Final   Serratia marcescens NOT DETECTED NOT DETECTED Final   Haemophilus influenzae NOT DETECTED NOT DETECTED Final   Neisseria meningitidis NOT DETECTED NOT DETECTED Final   Pseudomonas aeruginosa NOT DETECTED NOT DETECTED Final   Stenotrophomonas maltophilia NOT DETECTED NOT DETECTED Final   Candida albicans NOT DETECTED NOT DETECTED Final   Candida auris NOT DETECTED NOT DETECTED Final   Candida glabrata NOT DETECTED NOT DETECTED Final   Candida krusei NOT DETECTED NOT DETECTED Final   Candida parapsilosis NOT DETECTED NOT DETECTED Final   Candida tropicalis NOT DETECTED NOT DETECTED Final   Cryptococcus neoformans/gattii NOT DETECTED NOT DETECTED Final   Meth resistant mecA/C and MREJ DETECTED (A) NOT DETECTED Final    Comment: CRITICAL RESULT CALLED TO, READ BACK BY AND VERIFIED WITH: M LILLISTON,[email or web address]  11/03/21 [place] Performed at [facility], [address] [place], [place] [address]   Blood culture (routine x 2)     Status: Abnormal   Collection Time: 11/02/21 12:17 PM   Specimen: BLOOD  Result Value Ref Range Status   Specimen Description   Final    BLOOD LEFT ANTECUBITAL Performed at [facility] [address], [place], [place] [address]    Special Requests   Final    BOTTLES DRAWN AEROBIC AND ANAEROBIC Blood Culture adequate volume Performed at [facility] [address] [place], [place] [address]    Culture  Setup Time   Final    GRAM POSITIVE COCCI IN  CLUSTERS IN BOTH AEROBIC AND ANAEROBIC BOTTLES CRITICAL VALUE NOTED.  VALUE IS CONSISTENT WITH PREVIOUSLY REPORTED AND CALLED VALUE.    Culture (A)  Final    STAPHYLOCOCCUS AUREUS SUSCEPTIBILITIES PERFORMED ON PREVIOUS CULTURE WITHIN THE LAST 5 DAYS. Performed at [facility], [place] [address] [place],  [address]    Report Status 11/05/2021 FINAL  Final  Resp Panel by RT-PCR (Flu A&B, Covid) Nasopharyngeal Swab     Status: None   Collection Time: 11/02/21  4:16 PM   Specimen: Nasopharyngeal Swab; Nasopharyngeal(NP) swabs in vial transport medium  Result Value Ref Range Status   SARS Coronavirus 2 by RT PCR NEGATIVE NEGATIVE Final    Comment: (NOTE) SARS-CoV-2 target nucleic acids are NOT DETECTED.  The SARS-CoV-2 RNA  is generally detectable in upper respiratory specimens during the acute phase of infection. The lowest concentration of SARS-CoV-2 viral copies this assay can detect is 138 copies/mL. A negative result does not preclude SARS-Cov-2 infection and should not be used as the sole basis for treatment or other patient management decisions. A negative result may occur with  improper specimen collection/handling, submission of specimen other than nasopharyngeal swab, presence of viral mutation(s) within the areas targeted by this assay, and inadequate number of viral copies(<138 copies/mL). A negative result must be combined with clinical observations, patient history, and epidemiological information. The expected result is Negative.  Fact Sheet for Patients:  EntrepreneurPulse.com.au  Fact Sheet for Healthcare Providers:  IncredibleEmployment.be  This test is no t yet approved or cleared by the [place] FDA and  has been authorized for detection and/or diagnosis of SARS-CoV-2 by FDA under an Emergency Use Authorization (EUA). This EUA will remain  in effect (meaning this test can be used) for the duration of  the COVID-19 declaration under Section 564(b)(1) of the Act, 21 U.S.C.section 360bbb-3(b)(1), unless the authorization is terminated  or revoked sooner.       Influenza A by PCR NEGATIVE NEGATIVE Final   Influenza B by PCR NEGATIVE NEGATIVE Final    Comment: (NOTE) The Xpert Xpress SARS-CoV-2/FLU/RSV plus assay is intended as an aid in the diagnosis of influenza from Nasopharyngeal swab specimens and should not be used as a sole basis for treatment. Nasal washings and aspirates are unacceptable for Xpert Xpress SARS-CoV-2/FLU/RSV testing.  Fact Sheet for Patients: EntrepreneurPulse.com.au  Fact Sheet for Healthcare Providers: IncredibleEmployment.be  This test is not yet approved or cleared by the [place] FDA and has been authorized for detection and/or diagnosis of SARS-CoV-2 by FDA under an Emergency Use Authorization (EUA). This EUA will remain in effect (meaning this test can be used) for the duration of the COVID-19 declaration under Section 564(b)(1) of the Act, 21 U.S.C. section 360bbb-3(b)(1), unless the authorization is terminated or revoked.  Performed at [facility], [place] [address], [place], [place] [address]   Urine Culture     Status: Abnormal   Collection Time: 11/02/21  4:33 PM   Specimen: In/Out Cath Urine  Result Value Ref Range Status   Specimen Description   Final    IN/OUT CATH URINE Performed at [facility] [address], [place], [place] [address]    Special Requests   Final    NONE Performed at [facility], [place] [address], [place], [place] [address]    Culture (A)  Final    >=100,000 COLONIES/mL METHICILLIN RESISTANT STAPHYLOCOCCUS AUREUS   Report Status 11/05/2021 FINAL  Final   Organism ID, Bacteria METHICILLIN RESISTANT STAPHYLOCOCCUS AUREUS (A)  Final      Susceptibility   Methicillin resistant staphylococcus aureus - MIC*     CIPROFLOXACIN >=8 RESISTANT Resistant     GENTAMICIN <=0.5 SENSITIVE Sensitive     NITROFURANTOIN <=16 SENSITIVE Sensitive     OXACILLIN >=4 RESISTANT Resistant     TETRACYCLINE <=1 SENSITIVE Sensitive     VANCOMYCIN 1 SENSITIVE Sensitive     TRIMETH/SULFA <=10 SENSITIVE Sensitive     CLINDAMYCIN <=0.25 SENSITIVE Sensitive     RIFAMPIN <=0.5 SENSITIVE Sensitive     Inducible Clindamycin NEGATIVE Sensitive     * >=100,000 COLONIES/mL METHICILLIN RESISTANT STAPHYLOCOCCUS AUREUS  Culture, blood (routine x 2)     Status: None   Collection Time: 11/04/21  5:19 AM  Specimen: BLOOD RIGHT HAND  Result Value Ref Range Status   Specimen Description   Final    BLOOD RIGHT HAND Performed at [facility], [place] [address] [place], [place] [address]    Special Requests   Final    BOTTLES DRAWN AEROBIC ONLY Blood Culture adequate volume Performed at [facility] [address] [place], [place] [address]    Culture   Final    NO GROWTH 5 DAYS Performed at [facility], [place] [address] [place], [place] [address]    Report Status 11/09/2021 FINAL  Final  Culture, blood (routine x 2)     Status: None   Collection Time: 11/04/21  5:19 AM   Specimen: BLOOD LEFT HAND  Result Value Ref Range Status   Specimen Description   Final    BLOOD LEFT HAND Performed at [facility], [place] [address] [place], [place] [address]    Special Requests   Final    BOTTLES DRAWN AEROBIC ONLY Blood Culture adequate volume Performed at [place] [address], [place], [place] [address]    Culture   Final    NO GROWTH 5 DAYS Performed at [facility], [place] [address] [place], [place] [address]    Report Status 11/09/2021 FINAL  Final  Culture, blood (routine x 2)     Status: None   Collection Time: 11/06/21  5:48 AM   Specimen: BLOOD RIGHT HAND  Result Value Ref Range Status   Specimen Description   Final    BLOOD RIGHT HAND Performed at  [facility] [address], [place], [place] [address]    Special Requests   Final    BOTTLES DRAWN AEROBIC ONLY Blood Culture adequate volume Performed at [facility] [address] [place], [place] [address]    Culture   Final    NO GROWTH 5 DAYS Performed at [facility], [place] [address] [place], [place] [address]    Report Status 11/11/2021 FINAL  Final  Culture, blood (routine x 2)     Status: Abnormal   Collection Time: 11/06/21  5:52 AM   Specimen: BLOOD LEFT HAND  Result Value Ref Range Status   Specimen Description   Final    BLOOD LEFT HAND Performed at [facility] [address], [place], [place] [address]    Special Requests   Final    BOTTLES DRAWN AEROBIC AND ANAEROBIC Blood Culture adequate volume Performed at [facility] [address] [place], [place] [address]    Culture  Setup Time   Final    GRAM POSITIVE COCCI ANAEROBIC BOTTLE ONLY CRITICAL VALUE NOTED.  VALUE IS CONSISTENT WITH PREVIOUSLY REPORTED AND CALLED VALUE.    Culture (A)  Final    STAPHYLOCOCCUS AUREUS SUSCEPTIBILITIES PERFORMED ON PREVIOUS CULTURE WITHIN THE LAST 5 DAYS. Performed at [facility], [place] [address] [place], [place] [address]    Report Status 11/08/2021 FINAL  Final  Culture, blood (routine x 2)     Status: Abnormal   Collection Time: 11/07/21  9:28 AM   Specimen: BLOOD  Result Value Ref Range Status   Specimen Description   Final    BLOOD LEFT WRIST Performed at [facility] [address], [place], [place] [address]    Special Requests   Final    BOTTLES DRAWN AEROBIC AND ANAEROBIC Blood Culture adequate volume  Performed at [facility], [place] [address], [place], [place] [address]    Culture  Setup Time   Final    GRAM POSITIVE COCCI ANAEROBIC BOTTLE ONLY CRITICAL VALUE NOTED.  VALUE IS CONSISTENT WITH PREVIOUSLY REPORTED AND CALLED  VALUE. Performed at [facility], [place] [address] [place], [place] [address]    Culture METHICILLIN RESISTANT STAPHYLOCOCCUS AUREUS (A)  Final   Report Status 11/11/2021 FINAL  Final  Culture, blood (routine x 2)     Status: Abnormal   Collection Time: 11/07/21  9:29 AM   Specimen: BLOOD RIGHT HAND  Result Value Ref Range Status   Specimen Description   Final    BLOOD RIGHT HAND Performed at [place] [address] [place], [place] [address]    Special Requests   Final    BOTTLES DRAWN AEROBIC ONLY Blood Culture adequate volume Performed at [facility] [address] [place], [place] [address]    Culture  Setup Time   Final    GRAM POSITIVE COCCI AEROBIC BOTTLE ONLY CRITICAL VALUE NOTED.  VALUE IS CONSISTENT WITH PREVIOUSLY REPORTED AND CALLED VALUE.    Culture (A)  Final    STAPHYLOCOCCUS AUREUS SUSCEPTIBILITIES PERFORMED ON PREVIOUS CULTURE WITHIN THE LAST 5 DAYS. Performed at [facility], [place] [address], [place], [place] [address]    Report Status 11/09/2021 FINAL  Final  Culture, blood (routine x 2)     Status: None (Preliminary result)   Collection Time: 11/09/21 12:49 PM   Specimen: BLOOD  Result Value Ref Range Status   Specimen Description   Final    BLOOD RIGHT ANTECUBITAL Performed at [facility] [address], [place], [place] [address]    Special Requests   Final    BOTTLES DRAWN AEROBIC ONLY Blood Culture results may not be optimal due to an inadequate volume of blood received in culture bottles Performed at [facility] [address] [place], [place] [address]    Culture   Final    NO GROWTH 2 DAYS Performed at [facility] [address], [place], [place] [address]    Report Status PENDING  Incomplete  Culture, blood (routine x 2)     Status: None (Preliminary result)   Collection Time: 11/09/21 12:49 PM   Specimen: BLOOD  Result Value Ref Range  Status   Specimen Description   Final    BLOOD BLOOD LEFT HAND Performed at [facility] [address], [place], [place] [address]    Special Requests   Final    BOTTLES DRAWN AEROBIC ONLY Blood Culture adequate volume Performed at [facility] [address] [place], [place] [address]    Culture   Final    NO GROWTH 2 DAYS Performed at [facility] [address] [place],  [address]    Report Status PENDING  Incomplete     RN Pressure Injury Documentation:     Estimated body mass index is 33.61 kg/m as calculated from the following:   Height as of this encounter: 6' (1.829 m).   Weight as of this encounter: 112.4 kg.  Malnutrition Type:   Malnutrition Characteristics:   Nutrition Interventions:     Radiology Studies: No results found.  Scheduled Meds:  [MAR Hold] albuterol  2.5 mg Nebulization TID   [MAR Hold] enoxaparin (LOVENOX) injection  60 mg Subcutaneous Q24H   [MAR Hold] gabapentin  300 mg Oral TID   [MAR Hold] losartan  50 mg Oral Daily   [MAR Hold] mometasone-formoterol  2 puff Inhalation BID   [MAR Hold] pantoprazole  40 mg Oral Daily   [MAR Hold] QUEtiapine  25 mg Oral QHS   [MAR Hold] sodium chloride flush  3 mL Intravenous Q12H   Continuous Infusions:  [MAR Hold] sodium chloride 10 mL/hr at 11/03/21 1032   sodium chloride 75 mL/hr at 11/10/21 2206   [MAR Hold] ceFTAROline (TEFLARO) IV 600 mg (11/11/21 0601)   [MAR Hold] DAPTOmycin (CUBICIN)  IV 750 mg (11/10/21 1959)    LOS: 9 days   Kerney Elbe, DO [facility] PAGER is on [place]  If 7PM-7AM, please contact night-coverage [email or web address]

## 2021-11-11 NOTE — Progress Notes (Signed)
    [facility] for Infectious [facility]    Date of Admission:  11/02/2021   Total days of antibiotics 10   ID: Charles Weiss. is a 70 y.o. male with prolonged MRSA bacteremia with probable epidural abscess/osteo Principal Problem:   Sepsis ([place]) Active Problems:   Hypertension   Hyperlipidemia   Asthma   Acute respiratory failure with hypoxia (HCC)   Vertebral osteomyelitis (HCC)   MRSA bacteremia   Retropharyngeal abscess   Right lower quadrant abdominal pain    Subjective: Afebrile. But has increasing headache and neck pain, unable to flex neck. Unable to do TEE due to symptoms.  Medications:   albuterol  2.5 mg Nebulization TID   enoxaparin (LOVENOX) injection  60 mg Subcutaneous Q24H   gabapentin  300 mg Oral TID   losartan  50 mg Oral Daily   mometasone-formoterol  2 puff Inhalation BID   pantoprazole  40 mg Oral Daily   QUEtiapine  25 mg Oral QHS   sodium chloride flush  3 mL Intravenous Q12H    Objective: Vital signs in last 24 hours: Temp:  [97.8 F (36.6 C)-98.4 F (36.9 C)] 98.2 F (36.8 C) (11/22 1407) Pulse Rate:  [80-89] 85 (11/22 1407) Resp:  [16-20] 17 (11/22 1407) BP: (131-154)/(69-89) 141/89 (11/22 1407) SpO2:  [92 %-100 %] 95 % (11/22 1407) Weight:  [112.4 kg] 112.4 kg (11/22 1212) Physical Exam  Constitutional: He is oriented to person, place, and time. He appears well-developed and well-nourished. No distress.  HENT:  Mouth/Throat: Oropharynx is clear and moist. No oropharyngeal exudate. +nuchal rigidity Cardiovascular: Normal rate, regular rhythm and normal heart sounds. Exam reveals no gallop and no friction rub.  No murmur heard.  Pulmonary/Chest: Effort normal and breath sounds normal. No respiratory distress. He has no wheezes.  Abdominal: Soft. Bowel sounds are normal. He exhibits no distension. There is no tenderness.  Lymphadenopathy:  He has no cervical adenopathy.  Neurological: He is alert and oriented to person,  place, and time.  Skin: Skin is warm and dry. No rash noted. No erythema.  Psychiatric: He has a normal mood and affect. His behavior is normal.    Lab Results Recent Labs    11/10/21 0431 11/11/21 0434  WBC 9.5 8.7  HGB 9.3* 8.5*  HCT 27.1* 25.4*  NA 132* 133*  K 3.3* 4.3  CL 99 103  CO2 26 24  BUN 19 15  CREATININE 0.64 0.61   Liver Panel Recent Labs    11/10/21 0431 11/11/21 0434  PROT 6.6 6.4*  ALBUMIN 2.1* 2.1*  AST 56* 38  ALT 58* 52*  ALKPHOS 72 70  BILITOT 1.2 0.9   Sedimentation Rate No results for input(s): ESRSEDRATE in the last 72 hours. C-Reactive Protein No results for input(s): CRP in the last 72 hours.  Microbiology: 11/20 blood cx NGTD at 48hrs Studies/Results: No results found.   Assessment/Plan: New onset nuchal rigidity = recommend repeat MRI of c-spine and lumbar spine where previous abn found.  MRSA bacteremia = repeat blood cx are ngtd after having escalated to ceftaroline plus daptomycin. Will continue to follow blood cx. TEE deferred at this time due to new symptoms  ? Esophageal swelling = per cardiology. May need neck CT for evaluation of abscess formation  [facility] Pager: [phone]  11/11/2021, 4:06 PM

## 2021-11-11 NOTE — Progress Notes (Signed)
Pt scheduled to have TEE at [facility] hospital procedure got cancelled due has increasing headache and neck pain, unable to flex neck. Pt also found to have esophageal swelling.

## 2021-11-11 NOTE — TOC Progression Note (Signed)
Transition of Care (TOC) - Progression Note    Patient Details  Name: Charles Weiss. MRN: [id number] Date of Birth: May 18, 1951  Transition of Care [facility]) CM/SW Contact  Leeroy Cha, RN Phone Number: 11/11/2021, 8:27 AM  Clinical Narrative:    70 year old Caucasian male with a past medical history significant for but limited to asthma, hypertension as well as other comorbidities with recent admission for influenza pneumonia and respiratory failure who came back to the emergency room with neck pain and headache.  He is noted to have a high fever and upon further work-up he is found to have evidence of discitis in the lumbar vertebra.  Blood cultures were positive for MRSA.  He was started on vancomycin and ID was consulted.  He is undergoing further work-up and his mental status is a little off and he is agitated.  MRI was unrevealing.  ID recommending repeating MRI later this week if mentation has not improved.   Continues to be extremely confused and is in soft wrist restraints given that he is a danger to himself given his mentation but was a little better so restraints were removed.  Continues to have significant right-sided abdominal pain but CT scan of the abdomen pelvis done as below.  Of note he does have some bruising on his abdomen on the right side likely from his enoxaparin injection TOC PLAN OF CARE: Following for toc and dc needs-home iv abx set up eith Pam chandler if needed, Continues to be on Teflaro and Cubicin.  Expected Discharge Plan: Home/Self Care    Expected Discharge Plan and Services Expected Discharge Plan: Home/Self Care   Discharge Planning Services: CM Consult   Living arrangements for the past 2 months: Single Family Home                                       Social Determinants of Health (SDOH) Interventions    Readmission Risk Interventions Readmission Risk Prevention Plan 11/03/2021  Transportation Screening Complete  PCP  or Specialist Appt within 3-5 Days Complete  HRI or [place] Complete  Social Work Consult for [place] Planning/Counseling Complete  Palliative Care Screening Not Applicable  Medication Review Press photographer) Complete

## 2021-11-11 NOTE — Anesthesia Procedure Notes (Signed)
Procedure Name: MAC Date/Time: 11/11/2021 1:01 PM Performed by: Imagene Riches, CRNA Pre-anesthesia Checklist: Patient identified, Emergency Drugs available, Suction available, Patient being monitored and Timeout performed Patient Re-evaluated:Patient Re-evaluated prior to induction Oxygen Delivery Method: Nasal cannula

## 2021-11-11 NOTE — Transfer of Care (Signed)
Immediate Anesthesia Transfer of Care Note  Patient: Charles Weiss.  Procedure(s) Performed: CANCELLED PROCEDURE  Patient Location: Endoscopy Unit  Anesthesia Type:MAC  Level of Consciousness: awake, alert  and oriented  Airway & Oxygen Therapy: Patient Spontanous Breathing  Post-op Assessment: Report given to RN and Post -op Vital signs reviewed and stable  Post vital signs: Reviewed and stable  Last Vitals:  Vitals Value Taken Time  BP 153/70 11/11/21 1259  Temp    Pulse 85 11/11/21 1302  Resp 17 11/11/21 1302  SpO2 100 % 11/11/21 1302  Vitals shown include unvalidated device data.  Last Pain:  Vitals:   11/11/21 1259  TempSrc:   PainSc: 7       Patients Stated Pain Goal: 3 (33/54/56 2563)  Complications: No notable events documented.

## 2021-11-11 NOTE — Anesthesia Postprocedure Evaluation (Signed)
Anesthesia Post Note  Patient: Charles Weiss.  Procedure(s) Performed: CANCELLED PROCEDURE     Patient location during evaluation: PACU Anesthesia Type: MAC Level of consciousness: awake and alert Pain management: pain level controlled Vital Signs Assessment: post-procedure vital signs reviewed and stable Respiratory status: spontaneous breathing, nonlabored ventilation and respiratory function stable Cardiovascular status: stable and blood pressure returned to baseline Postop Assessment: no apparent nausea or vomiting Anesthetic complications: no   No notable events documented.  Last Vitals:  Vitals:   11/11/21 1212 11/11/21 1259  BP: (!) 154/69 (!) 153/70  Pulse: 88 85  Resp: 19 20  Temp: 36.6 C   SpO2: 100% 97%    Last Pain:  Vitals:   11/11/21 1259  TempSrc:   PainSc: 7                  [name],W. EDMOND

## 2021-11-12 ENCOUNTER — Inpatient Hospital Stay (HOSPITAL_COMMUNITY): Payer: Medicare Other

## 2021-11-12 ENCOUNTER — Encounter (HOSPITAL_COMMUNITY): Payer: Self-pay | Admitting: Internal Medicine

## 2021-11-12 DIAGNOSIS — A4102 Sepsis due to Methicillin resistant Staphylococcus aureus: Secondary | ICD-10-CM | POA: Diagnosis not present

## 2021-11-12 DIAGNOSIS — J45909 Unspecified asthma, uncomplicated: Secondary | ICD-10-CM | POA: Diagnosis not present

## 2021-11-12 DIAGNOSIS — J9601 Acute respiratory failure with hypoxia: Secondary | ICD-10-CM | POA: Diagnosis not present

## 2021-11-12 DIAGNOSIS — I10 Essential (primary) hypertension: Secondary | ICD-10-CM | POA: Diagnosis not present

## 2021-11-12 LAB — CBC WITH DIFFERENTIAL/PLATELET
Abs Immature Granulocytes: 0.11 10*3/uL — ABNORMAL HIGH (ref 0.00–0.07)
Basophils Absolute: 0 10*3/uL (ref 0.0–0.1)
Basophils Relative: 0 %
Eosinophils Absolute: 0.1 10*3/uL (ref 0.0–0.5)
Eosinophils Relative: 1 %
HCT: 26.1 % — ABNORMAL LOW (ref 39.0–52.0)
Hemoglobin: 8.8 g/dL — ABNORMAL LOW (ref 13.0–17.0)
Immature Granulocytes: 1 %
Lymphocytes Relative: 11 %
Lymphs Abs: 0.8 10*3/uL (ref 0.7–4.0)
MCH: 30.9 pg (ref 26.0–34.0)
MCHC: 33.7 g/dL (ref 30.0–36.0)
MCV: 91.6 fL (ref 80.0–100.0)
Monocytes Absolute: 0.6 10*3/uL (ref 0.1–1.0)
Monocytes Relative: 8 %
Neutro Abs: 6 10*3/uL (ref 1.7–7.7)
Neutrophils Relative %: 79 %
Platelets: 257 10*3/uL (ref 150–400)
RBC: 2.85 MIL/uL — ABNORMAL LOW (ref 4.22–5.81)
RDW: 14.9 % (ref 11.5–15.5)
WBC: 7.6 10*3/uL (ref 4.0–10.5)
nRBC: 0 % (ref 0.0–0.2)

## 2021-11-12 LAB — COMPREHENSIVE METABOLIC PANEL
ALT: 58 U/L — ABNORMAL HIGH (ref 0–44)
AST: 29 U/L (ref 15–41)
Albumin: 2.1 g/dL — ABNORMAL LOW (ref 3.5–5.0)
Alkaline Phosphatase: 67 U/L (ref 38–126)
Anion gap: 7 (ref 5–15)
BUN: 12 mg/dL (ref 8–23)
CO2: 26 mmol/L (ref 22–32)
Calcium: 8.7 mg/dL — ABNORMAL LOW (ref 8.9–10.3)
Chloride: 97 mmol/L — ABNORMAL LOW (ref 98–111)
Creatinine, Ser: 0.71 mg/dL (ref 0.61–1.24)
GFR, Estimated: >60 mL/min (ref >60)
Glucose, Bld: 104 mg/dL — ABNORMAL HIGH (ref 70–99)
Potassium: 4 mmol/L (ref 3.5–5.1)
Sodium: 130 mmol/L — ABNORMAL LOW (ref 135–145)
Total Bilirubin: 0.9 mg/dL (ref 0.3–1.2)
Total Protein: 6.9 g/dL (ref 6.5–8.1)

## 2021-11-12 LAB — PHOSPHORUS: Phosphorus: 4.4 mg/dL (ref 2.5–4.6)

## 2021-11-12 LAB — MAGNESIUM: Magnesium: 1.8 mg/dL (ref 1.7–2.4)

## 2021-11-12 IMAGING — MR MR LUMBAR SPINE WO/W CM
6 of 7 series · 30 of 48 positions shown · IV contrast (GADAVIST)
Comparison: Lumbar MRI [DATE].  Abdominopelvic CT [DATE].

CLINICAL DATA: Acute neck pain with rotation. Headache. Abnormal
lumbar MRI suggesting discitis at L4-5.

EXAM:
MRI LUMBAR SPINE WITHOUT AND WITH CONTRAST
TECHNIQUE: Multiplanar and multiecho pulse sequences of the lumbar spine were
obtained without and with intravenous contrast.
CONTRAST:  10mL GADAVIST GADOBUTROL 1 MMOL/ML IV SOLN

[Series 5: T1 · sagittal · 4.0mm · 0.81mm/px · 5 of 17 slices shown (1 of 2)]
[im 1/17]
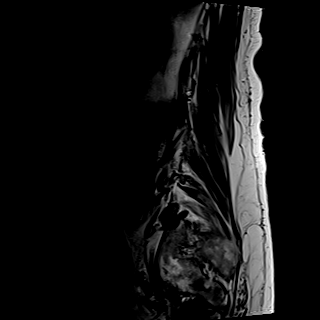
[im 5/17]
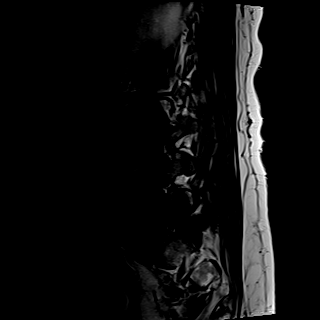
[im 9/17]
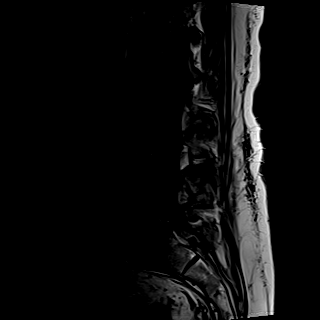
[im 13/17]
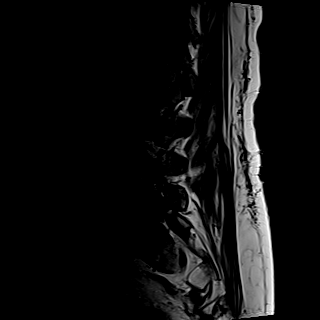
[im 17/17]
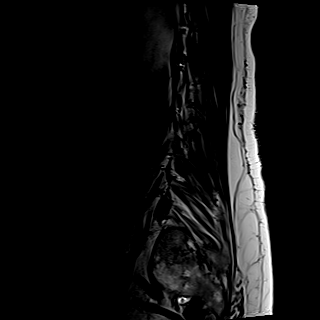

[Series 6: STIR · sagittal · 4.0mm · 0.51mm/px · 1 of 17 slices shown]
[im 1/17]
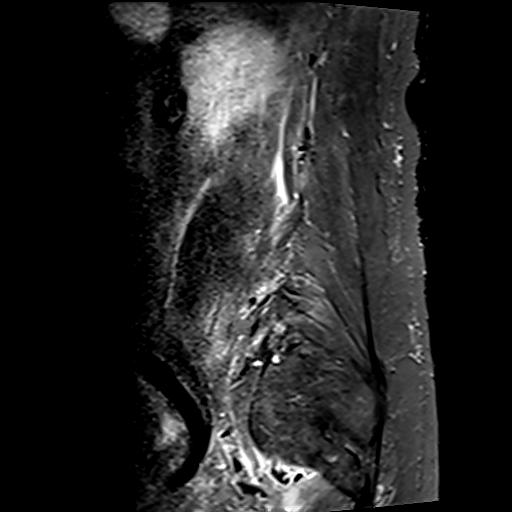

[Series 7: T2 · axial · 4.0mm · 0.62mm/px · z∈[-27,+193]mm · 8 of 39 slices shown]
[im 1/39]
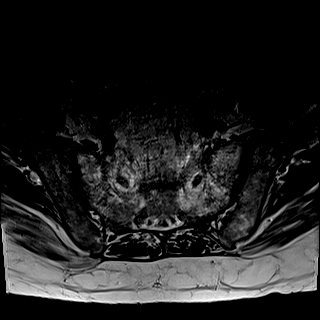
[im 5/39]
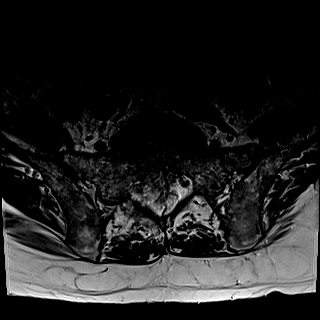
[im 13/39]
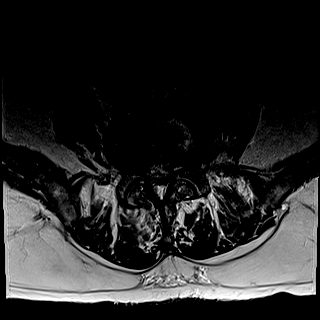
[im 17/39]
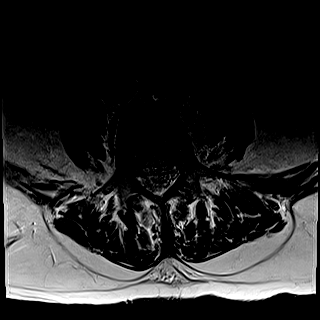
[im 22/39]
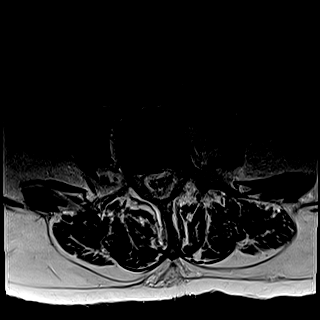
[im 26/39]
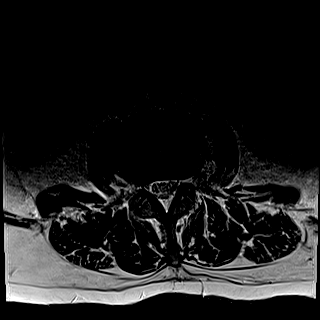
[im 34/39]
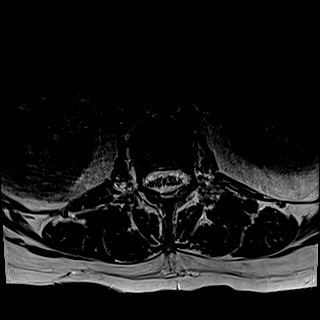
[im 39/39]
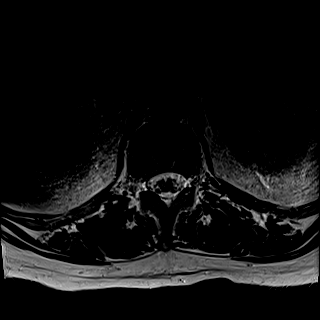

[Series 8: T1 · axial · 4.0mm · 0.39mm/px · z∈[-27,+193]mm · 8 of 39 slices shown (2 of 2)]
[im 1/39]
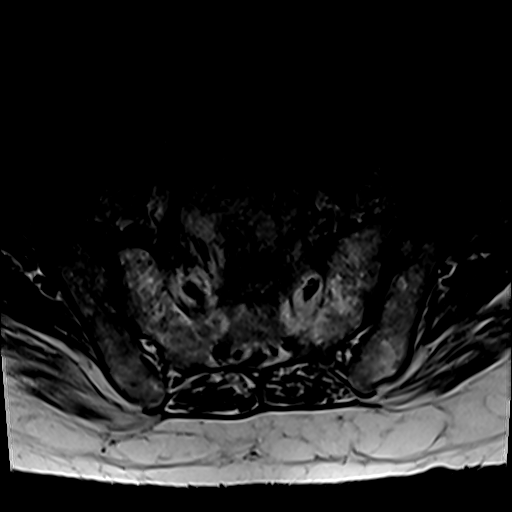
[im 5/39]
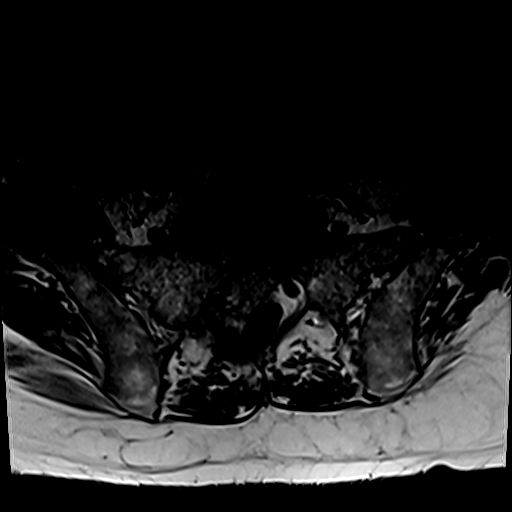
[im 13/39]
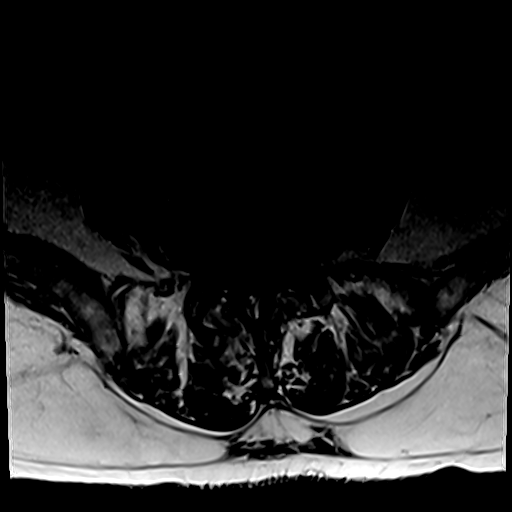
[im 17/39]
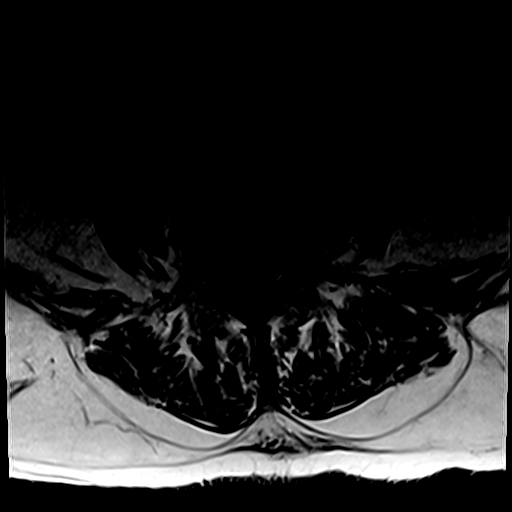
[im 22/39]
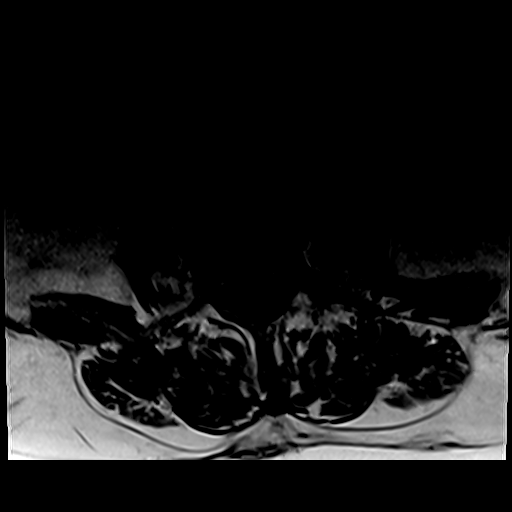
[im 26/39]
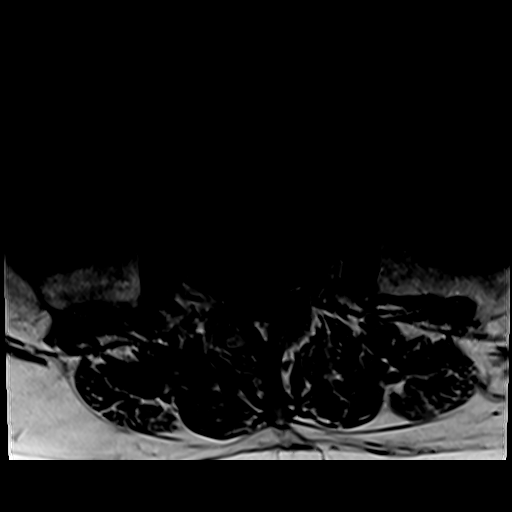
[im 34/39]
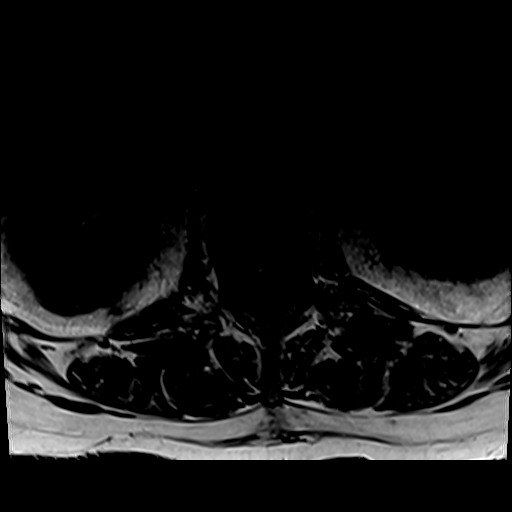
[im 39/39]
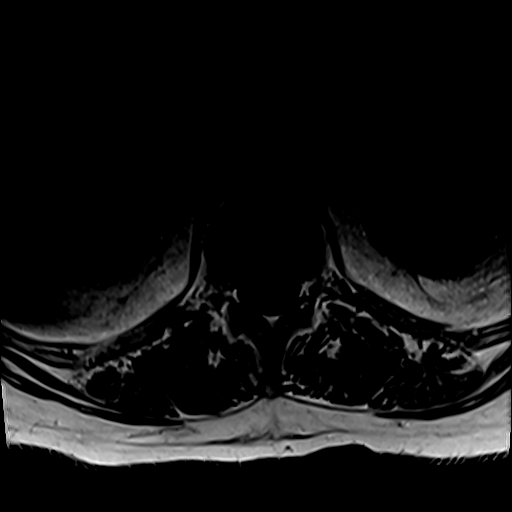

[Series 9: T2 post-contrast · sagittal · 4.0mm · 0.81mm/px · 4 of 17 slices shown]
[im 1/17]
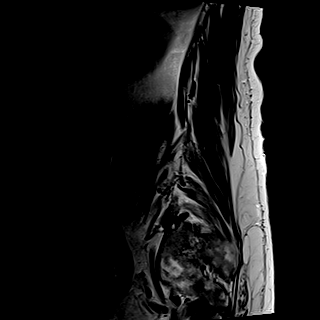
[im 6/17]
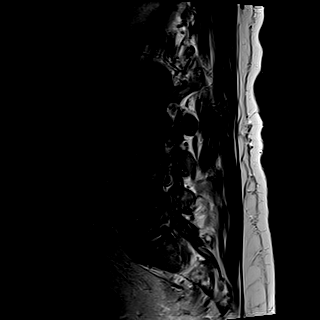
[im 11/17]
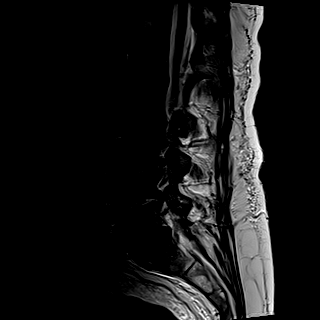
[im 17/17]
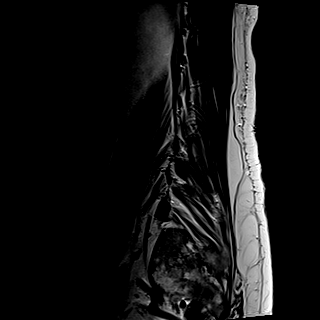

[Series 10: T1 fat-sat post-contrast · sagittal · 4.0mm · 0.81mm/px · 4 of 17 slices shown]
[im 1/17]
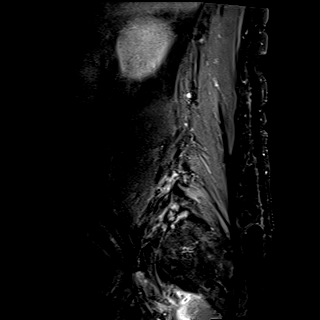
[im 6/17]
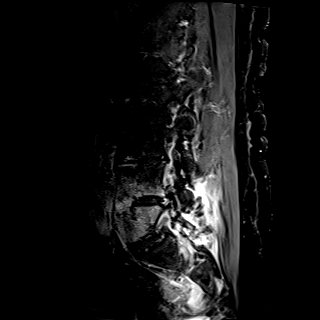
[im 11/17]
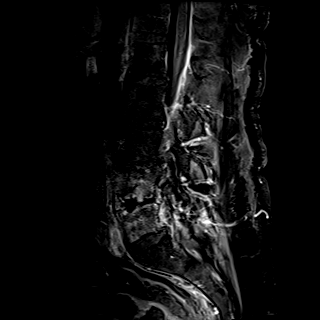
[im 17/17]
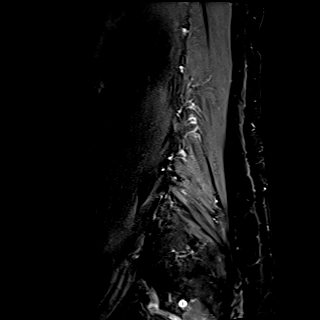

[30 of 48 positions shown; findings below may reference images not displayed]

FINDINGS: Segmentation: Conventional anatomy assumed, with the last open disc
space designated L5-S1.

Alignment: Stable. There is straightening with a minimal
degenerative anterolisthesis at L5-S1.

Vertebrae: Although comparison is limited due to motion on previous
MRI, there is evidence of increased bone marrow edema throughout the
L4 and L5 vertebral bodies on the inversion recovery images, highly
suspicious for osteomyelitis. There is associated progressive
endplate irregularity and diffuse discal T2 hyperintensity. There
are predominately fatty endplate degenerative changes at L3-4,
without definite signs of discitis at that level. Possible findings
of osteomyelitis adjacent to the right L5-S1 facet joint. The
visualized sacroiliac joints appear unremarkable.

Conus medullaris: Extends to the L1 level and appears normal.

Paraspinal and other soft tissues: Mild anterior paraspinal
inflammatory changes at L4-5. No focal anterior fluid collection
identified. There is a fluid collection projecting posteriorly from
the right L5-S1 facet joint, further described below.

Disc levels:

No significant disc space findings at T12-L1 or L1-2.

L2-3: Mild disc bulging and bilateral facet hypertrophy. No
significant spinal stenosis or nerve root encroachment.

L3-4: Loss of disc height with annular disc bulging and endplate
osteophytes. Mild facet and ligamentous hypertrophy. Resulting mild
spinal stenosis with mild lateral recess and foraminal narrowing
bilaterally.

L4-5: As above, progressive findings of discitis and osteomyelitis
at this level. There is progressive annular disc bulging with
ventral epidural soft tissue thickening and increased mass effect on
the thecal sac. Moderate facet and ligamentous hypertrophy without
definite evidence of facet joint infection.

L5-S1: Chronic degenerative disc disease with annular disc bulging
and a broad-based disc protrusion. There are marrow changes within
the right L5-S1 facet joint with an enlarging posteriorly projecting
fluid collection, measuring 1.9 cm on image [DATE]. Underlying mild to
moderate foraminal narrowing bilaterally, grossly stable.
IMPRESSION: 1. Progressive changes of diskitis and osteomyelitis at L4-5 with
ventral epidural thickening and increased mass effect on the thecal
sac.
2. Possible changes of osteomyelitis involving the right L5-S1 facet
joint with a posteriorly projecting small abscess or synovial cyst.
3. Stable endplate degenerative changes at L3-4 without definite
signs of discitis or osteomyelitis.
4. Multilevel spondylosis as detailed above.

## 2021-11-12 IMAGING — CT CT NECK W/ CM
4 of 5 series · 13 of 33 positions shown, 15 images · IV contrast (OMNIPAQUE)
Comparison: CT angio head neck [DATE]. MRI cervicothoracic and
lumbar spine [DATE]
COMPARISON: CT angio head neck [DATE]. MRI cervicothoracic and
lumbar spine [DATE]

Addendum:
CLINICAL DATA: Retropharyngeal edema.  History of prostate cancer

EXAM:
CT NECK WITH CONTRAST
TECHNIQUE: Multidetector CT imaging of the neck was performed using the
standard protocol following the bolus administration of intravenous
contrast.
CONTRAST:  60mL OMNIPAQUE IOHEXOL 350 MG/ML SOLN

[Series 4: axial bone · axial · 0.57mm/px · z∈[+81,+205]mm · 3 of 126 slices shown, 4 images]
[im 32/126  soft-tissue]
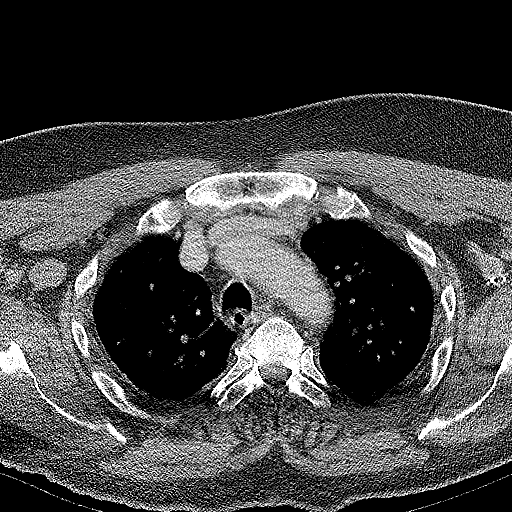
[im 32/126  bone]
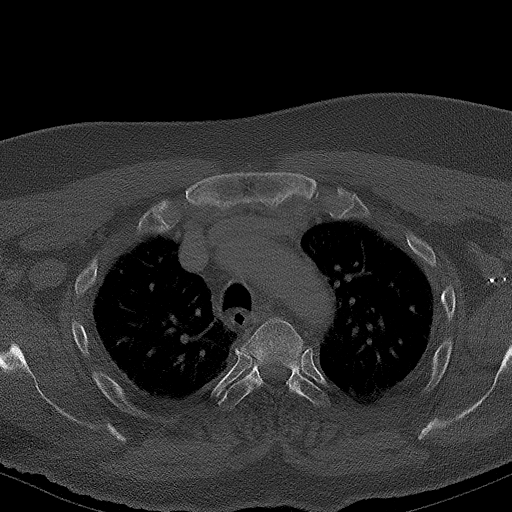
[im 63/126  bone]
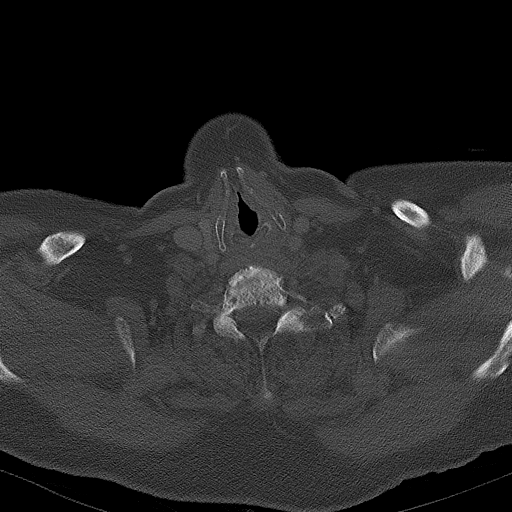
[im 94/126  bone]
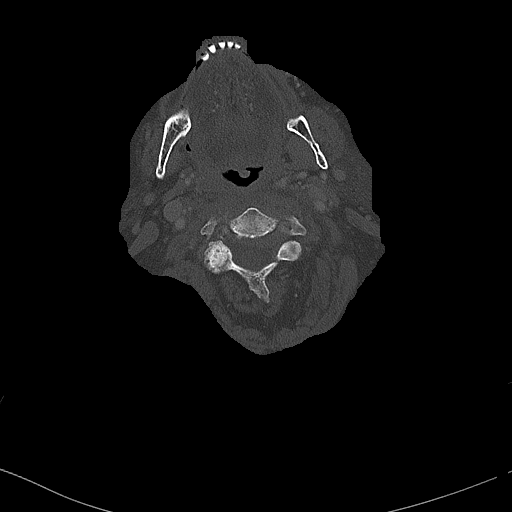

[Series 5: orthogonal (person_name) · axial · 0.43mm/px · z∈[+71,+135]mm · 2 of 130 slices shown]
[im 33/130  bone]
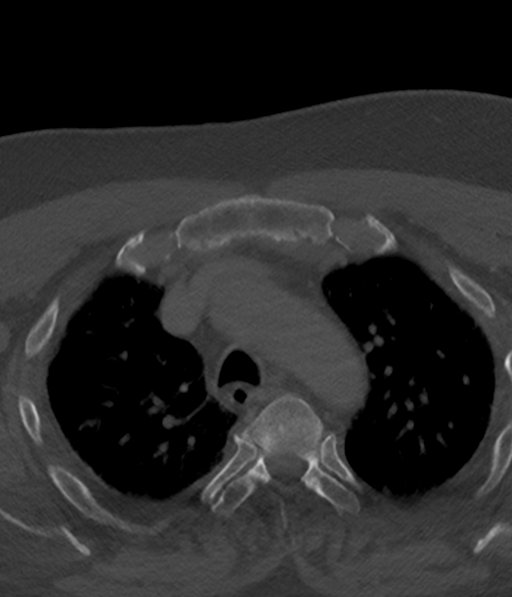
[im 65/130  bone]
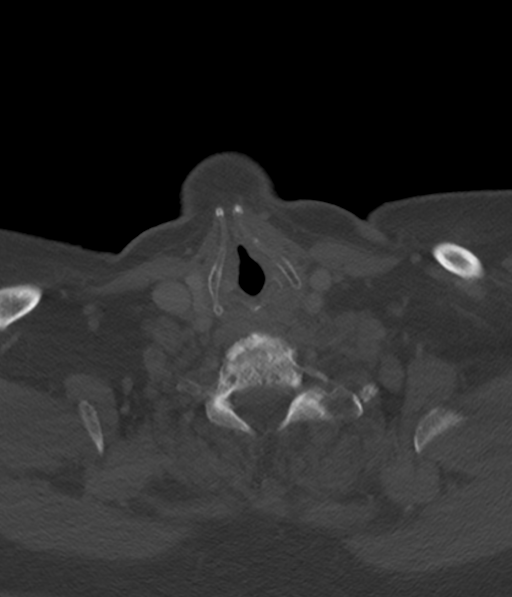

[Series 6: cor neck · coronal · 0.58mm/px · 3 of 120 slices shown]
[im 24/120  bone]
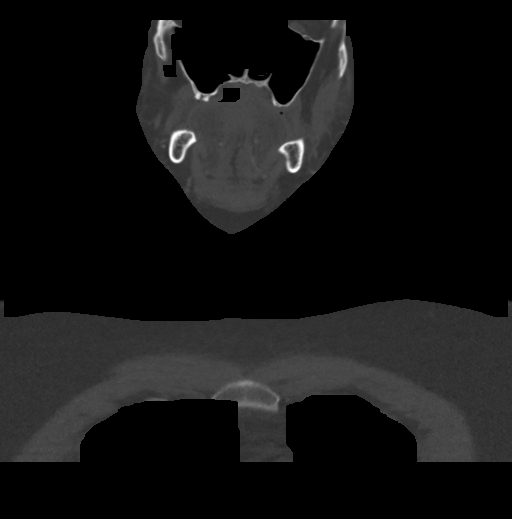
[im 48/120  bone]
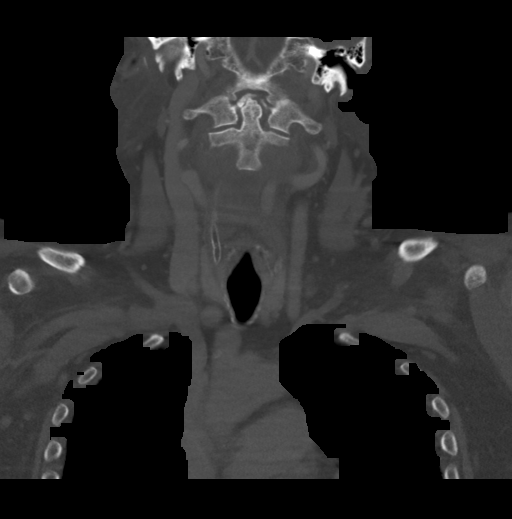
[im 72/120  bone]
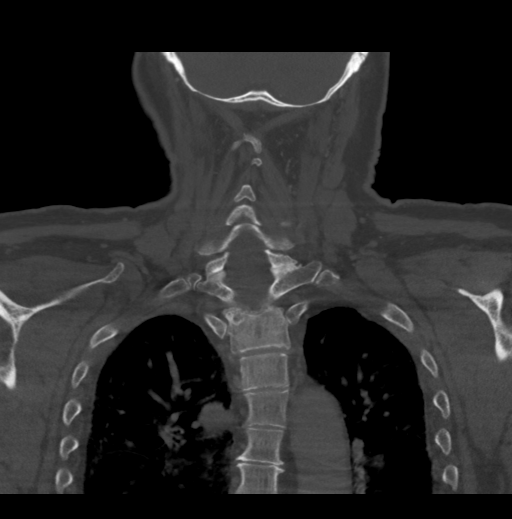

[Series 7: sag neck · sagittal · 0.52mm/px · 5 of 110 slices shown, 6 images]
[im 37/110  bone]
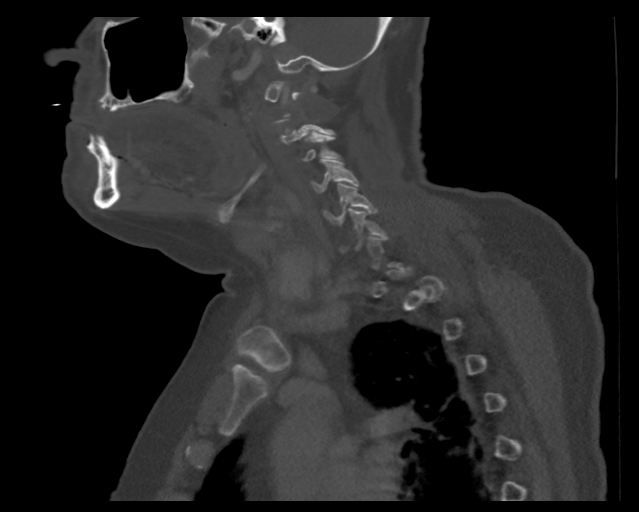
[im 46/110  bone]
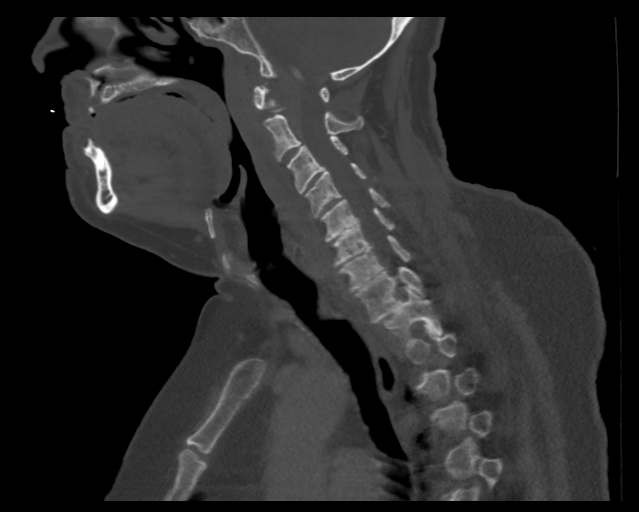
[im 55/110  soft-tissue]
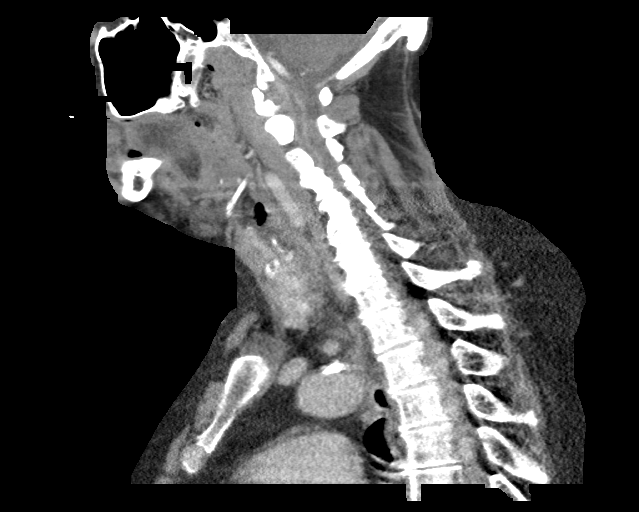
[im 55/110  bone]
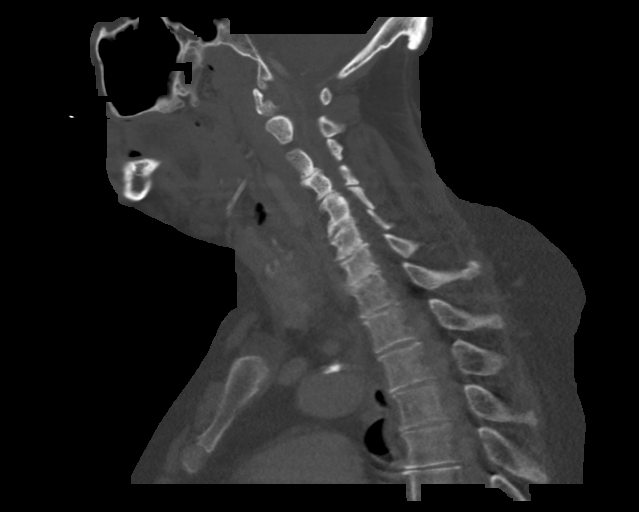
[im 64/110  bone]
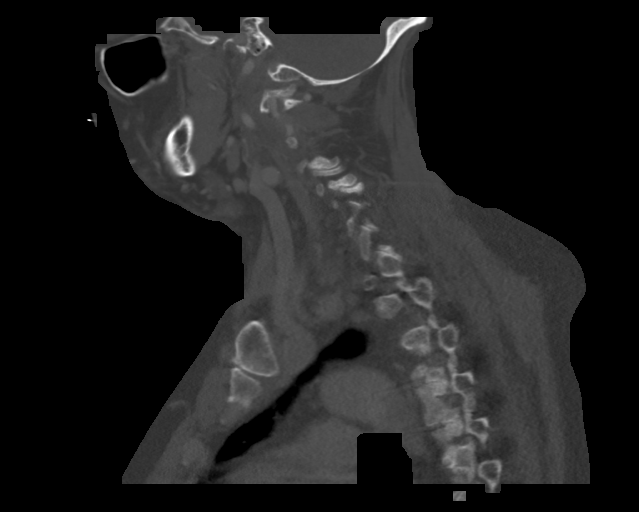
[im 73/110  bone]
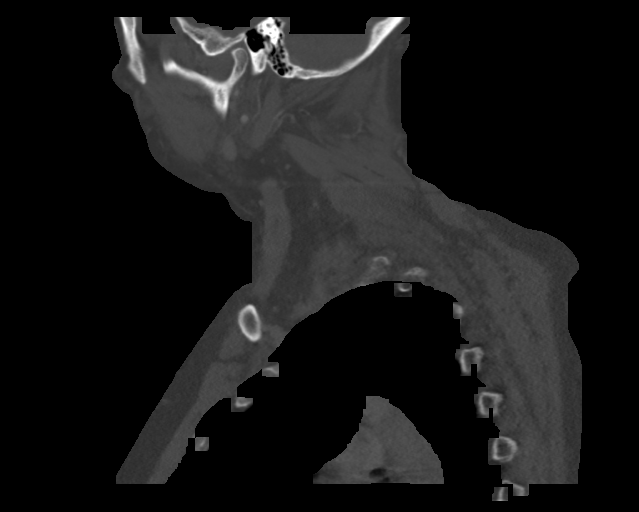

[13 of 33 positions shown; findings below may reference images not displayed]

FINDINGS: Pharynx and larynx: Diffuse soft tissue swelling of the prevertebral
soft tissues extending from the skull base to a proximally C3 or C4.
No mass or prevertebral abscess identified. Airway intact.
Epiglottis and larynx normal.

Salivary glands: No inflammation, mass, or stone.

Thyroid: Negative

Lymph nodes: No enlarged or pathologic lymph nodes in the neck.

Vascular: Normal vascular enhancement

Limited intracranial: Negative

Visualized orbits: Incompletely evaluated.

Mastoids and visualized paranasal sinuses: Paranasal sinuses clear.
Mastoid sinus clear.

Skeleton: Cervical spondylosis. Negative for cervical fracture or
osteomyelitis. No evidence of discitis.

There is progressive ventral epidural soft tissue thickening
beginning at C1 and extending to C2-3. This is causing some cord
flattening and mild spinal stenosis. Subtle low-density within the
thickening may represent epidural abscess. This has progressed since
the prior CT and MRI. Findings consistent with infection

Upper chest: Lung apices clear bilaterally

Other: None
IMPRESSION: 1. Prevertebral soft tissue swelling. Progressive epidural
thickening at C1 and C2 with central low density suggesting epidural
abscess. This is causing cord flattening and mild spinal stenosis.
Recommend repeat MRI cervical spine without and with contrast. The
prior MRI was degraded by significant motion. Recommend appropriate
sedation prior to MRI.
2. No evidence of discitis or osteomyelitis in the cervical spine.

ADDENDUM:
These results were called by telephone at the time of interpretation
on [DATE] at [DATE] to provider RTOY[place] , who verbally
acknowledged these results.

*** End of Addendum ***
FINDINGS: Pharynx and larynx: Diffuse soft tissue swelling of the prevertebral
soft tissues extending from the skull base to a proximally C3 or C4.
No mass or prevertebral abscess identified. Airway intact.
Epiglottis and larynx normal.

Salivary glands: No inflammation, mass, or stone.

Thyroid: Negative

Lymph nodes: No enlarged or pathologic lymph nodes in the neck.

Vascular: Normal vascular enhancement

Limited intracranial: Negative

Visualized orbits: Incompletely evaluated.

Mastoids and visualized paranasal sinuses: Paranasal sinuses clear.
Mastoid sinus clear.

Skeleton: Cervical spondylosis. Negative for cervical fracture or
osteomyelitis. No evidence of discitis.

There is progressive ventral epidural soft tissue thickening
beginning at C1 and extending to C2-3. This is causing some cord
flattening and mild spinal stenosis. Subtle low-density within the
thickening may represent epidural abscess. This has progressed since
the prior CT and MRI. Findings consistent with infection

Upper chest: Lung apices clear bilaterally

Other: None
IMPRESSION: 1. Prevertebral soft tissue swelling. Progressive epidural
thickening at C1 and C2 with central low density suggesting epidural
abscess. This is causing cord flattening and mild spinal stenosis.
Recommend repeat MRI cervical spine without and with contrast. The
prior MRI was degraded by significant motion. Recommend appropriate
sedation prior to MRI.
2. No evidence of discitis or osteomyelitis in the cervical spine.

## 2021-11-12 IMAGING — MR MR CERVICAL SPINE WO/W CM
7 of 8 series · 32 of 48 positions shown · IV contrast (gadavist)
Comparison: MRI cervical spine [DATE].  Neck CT [DATE].

CLINICAL DATA: Acute neck pain with rotation and headache.
Infection suspected. Concern of epidural abscess on CT neck. History
of prostate cancer.

EXAM:
MRI CERVICAL SPINE WITHOUT AND WITH CONTRAST
TECHNIQUE: Multiplanar and multiecho pulse sequences of the cervical spine, to
include the craniocervical junction and cervicothoracic junction,
were obtained without and with intravenous contrast.
CONTRAST:  10mL GADAVIST GADOBUTROL 1 MMOL/ML IV SOLN

[Series 5: T1 · sagittal · 3.0mm · 0.69mm/px · 4 of 15 slices shown (1 of 2)]
[im 1/15]
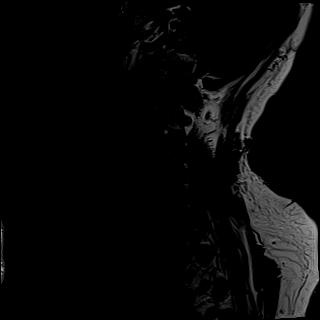
[im 5/15]
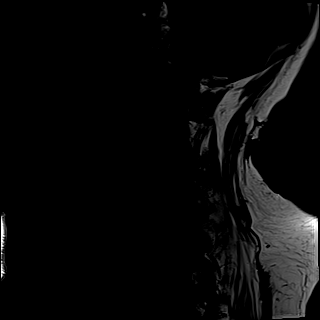
[im 10/15]
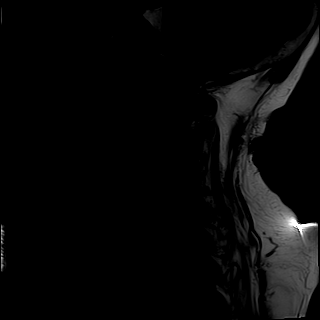
[im 15/15]
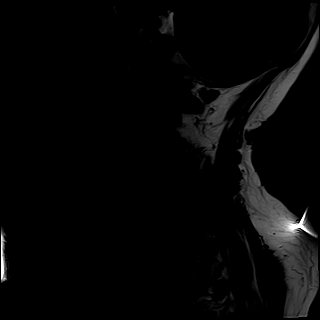

[Series 6: STIR · sagittal · 3.0mm · 0.86mm/px · 4 of 15 slices shown]
[im 1/15]
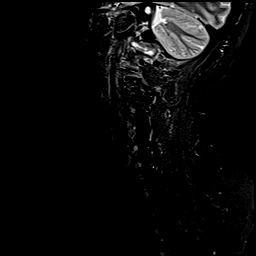
[im 5/15]
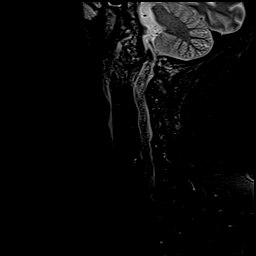
[im 10/15]
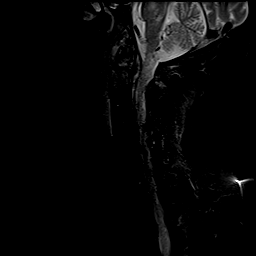
[im 15/15]
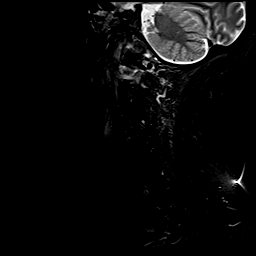

[Series 7: T2 · sagittal · 3.0mm · 0.69mm/px · 3 of 15 slices shown (1 of 2)]
[im 1/15]
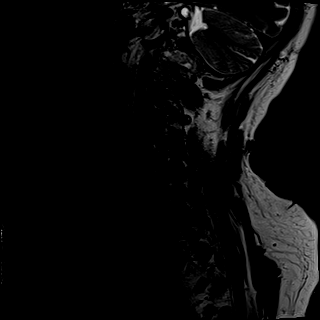
[im 8/15]
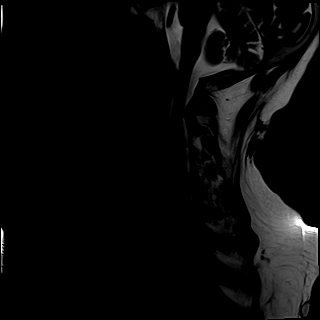
[im 15/15]
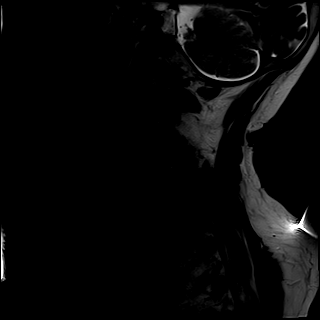

[Series 8: T2 · axial · 3.0mm · 0.70mm/px · z∈[-71,+55]mm · 8 of 38 slices shown (2 of 2)]
[im 1/38]
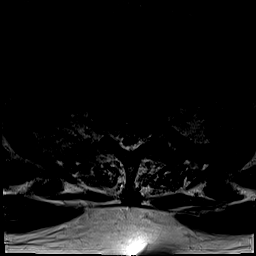
[im 6/38]
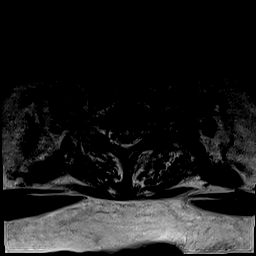
[im 11/38]
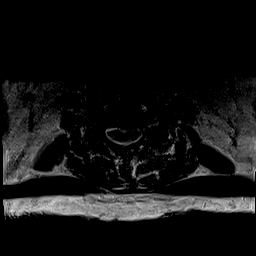
[im 16/38]
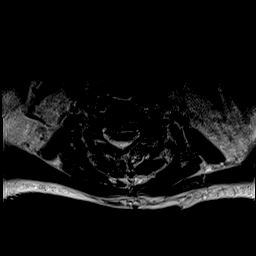
[im 22/38]
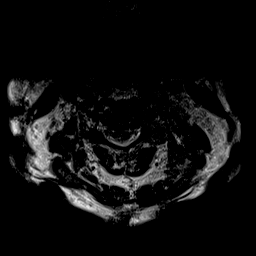
[im 27/38]
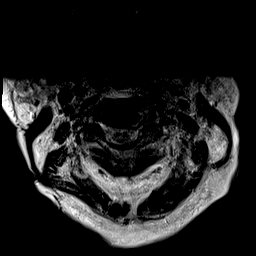
[im 32/38]
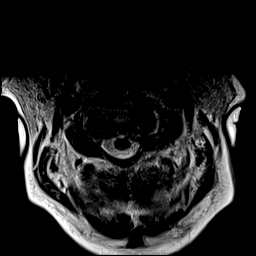
[im 38/38]
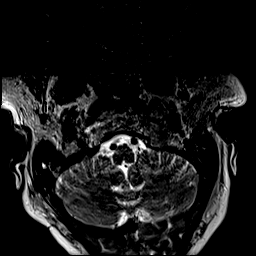

[Series 10: T1 · axial · 3.0mm · 0.35mm/px · z∈[-71,+55]mm · 8 of 38 slices shown (2 of 2)]
[im 1/38]
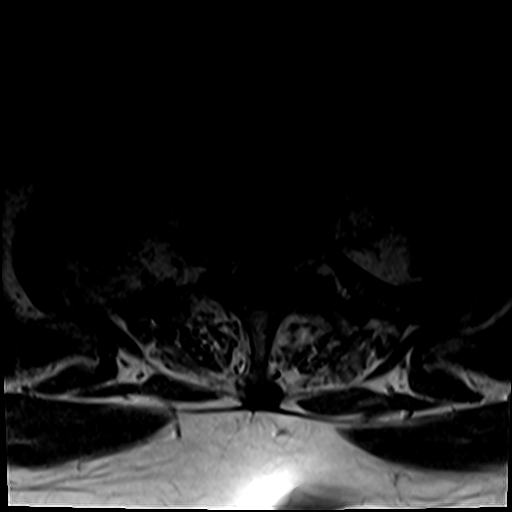
[im 6/38]
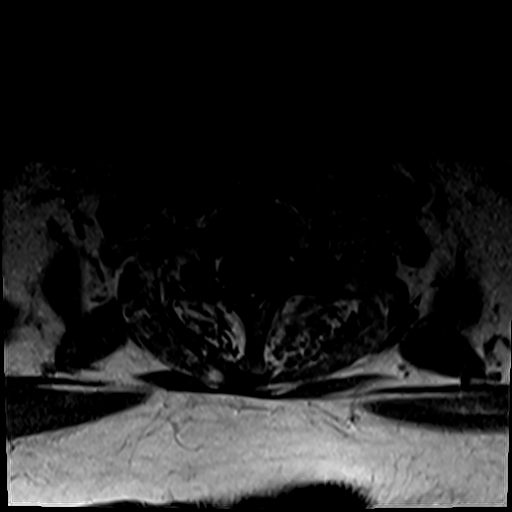
[im 11/38]
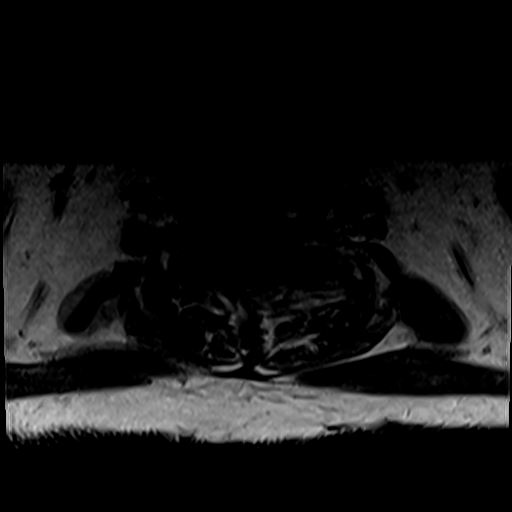
[im 16/38]
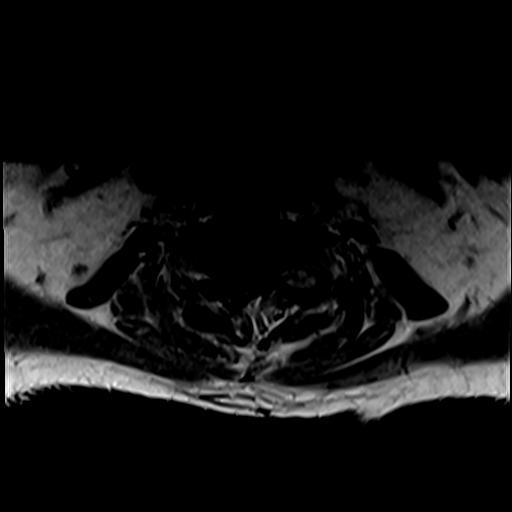
[im 22/38]
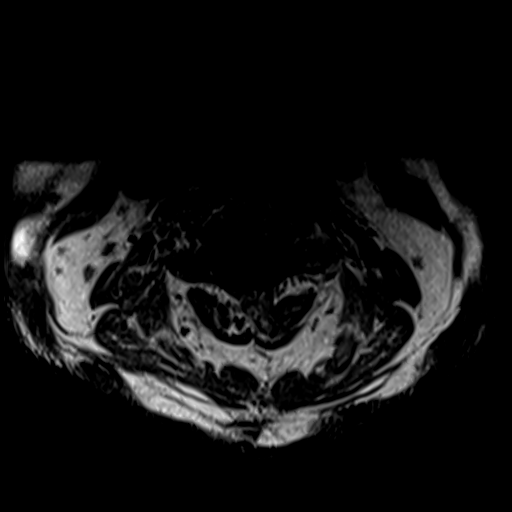
[im 27/38]
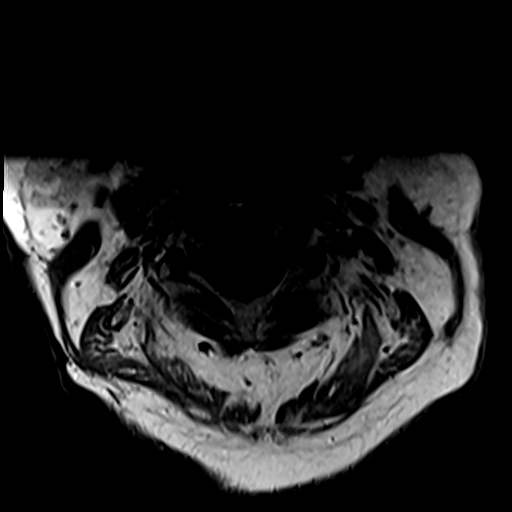
[im 32/38]
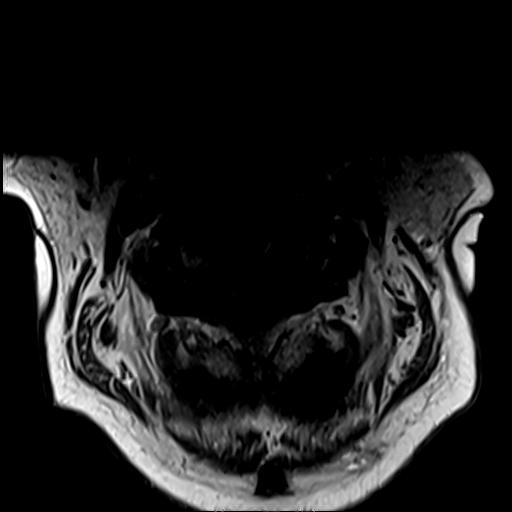
[im 38/38]
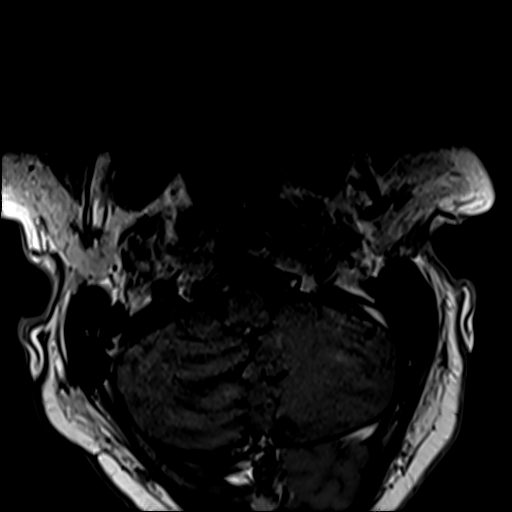

[Series 15: T1 fat-sat post-contrast · sagittal · 3.0mm · 0.69mm/px · 3 of 15 slices shown]
[im 1/15]
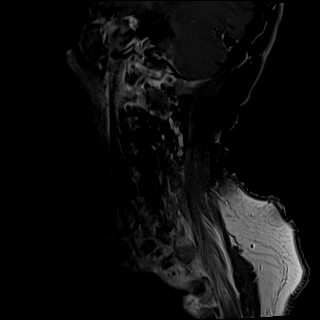
[im 8/15]
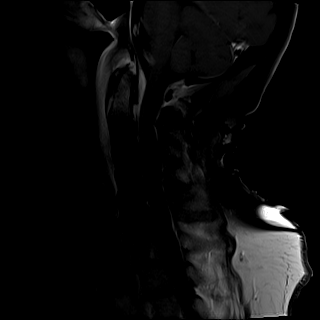
[im 15/15]
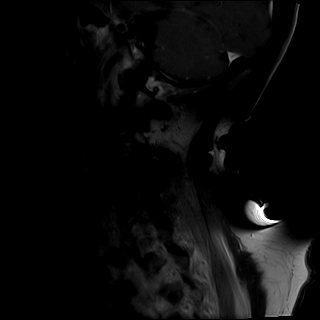

[Series 16: T1 post-contrast · axial · 3.0mm · 0.35mm/px · z∈[-21,-4]mm · 2 of 46 slices shown]
[im 1/46]
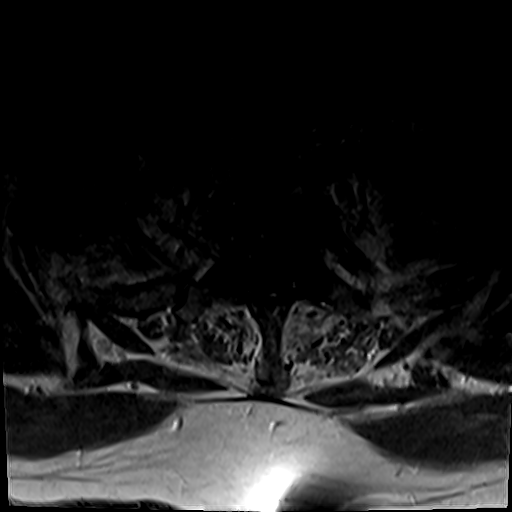
[im 6/46]
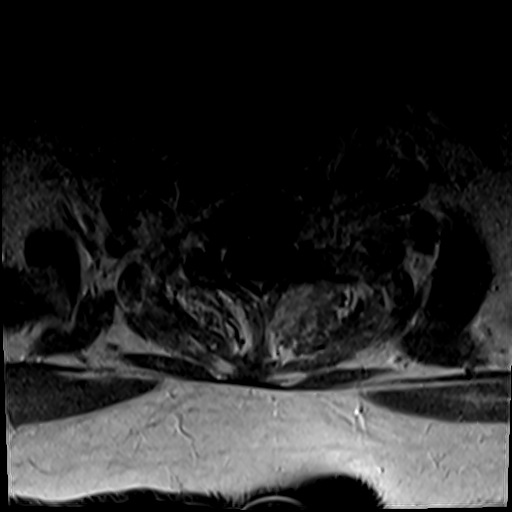

[32 of 48 positions shown; findings below may reference images not displayed]

FINDINGS: Despite efforts by the technologist and patient, mild motion
artifact is present on today's exam and could not be eliminated.
This reduces exam sensitivity and specificity.

Alignment: Straightening without focal angulation or listhesis.

Vertebrae: There is new marrow edema within the C2 vertebral body
and odontoid process as well as the anterior arch of C1. In
correlation with recent prior imaging, no definite fracture or
cortical destruction identified in these areas. Stable endplate
degenerative changes elsewhere in the cervical spine.

Cord: Normal in signal and caliber.

Posterior Fossa, vertebral arteries, paraspinal tissues: Diffuse
prevertebral/retropharyngeal soft tissue swelling again noted
extending from the skull base to C5, similar to the original MRI. As
seen on recent neck CT, there is increased ventral epidural soft
tissue thickening and/or complex fluid at C1-2. This partially
effaces the CSF surrounding the cord. No other epidural fluid
collections are seen. Bilateral vertebral artery flow voids.

Disc levels:

Assessment of the disc space levels is limited by motion. As above,
there is effacement of the CSF surrounding the cord at C1-2 by
probable ventral epidural inflammatory changes. No cord deformity.

C2-3: The disc appears normal. Asymmetric facet hypertrophy on the
right without significant foraminal narrowing.

C3-4: Spondylosis with loss of disc height, uncinate spurring and
facet hypertrophy asymmetric to the left. No spinal stenosis or
nerve root encroachment

C4-5: Spondylosis with loss of disc height, uncinate spurring and
asymmetric facet hypertrophy on the left. No cord deformity. Mild
left foraminal narrowing.

C5-6: Spondylosis with loss of disc height and bilateral uncinate
spurring contributing to mild foraminal narrowing bilaterally. No
cord deformity.

C6-7: Spondylosis with loss of disc height and bilateral uncinate
spurring. Mild foraminal narrowing bilaterally. No cord deformity.

C7-T1: Spondylosis with loss of disc height and posterior
osteophytes. No cord deformity.
IMPRESSION: 1. As seen on neck CT earlier today, there is progressive anterior
epidural soft tissue thickening or complex fluid at C1-2 associated
with new marrow edema in the C1 and C2 vertebral bodies, suspicious
for osteomyelitis and epidural abscess formation. The
prevertebral/retropharyngeal soft tissue swelling is similar to the
previous MRI of 9 days ago.
2. No cord compression or abnormal cord signal.
3. Multilevel cervical spondylosis without cord deformity or
high-grade foraminal narrowing.
4. Lumbar spine findings dictated separately.

## 2021-11-12 MED ORDER — METOPROLOL TARTRATE 5 MG/5ML IV SOLN: 5.0000 mg | INTRAVENOUS | Status: DC | PRN

## 2021-11-12 MED ORDER — GADOBUTROL 1 MMOL/ML IV SOLN
10.0000 mL | Freq: Once | INTRAVENOUS | Status: AC | PRN
  Administered 2021-11-12: 10 mL via INTRAVENOUS

## 2021-11-12 MED ORDER — TRAZODONE HCL 50 MG PO TABS
50.0000 mg | ORAL_TABLET | Freq: Every evening | ORAL | Status: DC | PRN
  Administered 2021-11-16 – 2021-11-25 (×4): 50 mg via ORAL
  Filled 2021-11-12 (×4): qty 1

## 2021-11-12 MED ORDER — IPRATROPIUM-ALBUTEROL 0.5-2.5 (3) MG/3ML IN SOLN: 3.0000 mL | RESPIRATORY_TRACT | Status: DC | PRN

## 2021-11-12 MED ORDER — GUAIFENESIN 100 MG/5ML PO LIQD
5.0000 mL | ORAL | Status: DC | PRN
  Administered 2021-11-18: 5 mL via ORAL
  Filled 2021-11-12: qty 10

## 2021-11-12 MED ORDER — OXYCODONE HCL 5 MG PO TABS
5.0000 mg | ORAL_TABLET | ORAL | Status: DC | PRN
  Administered 2021-11-12 – 2021-11-14 (×5): 5 mg via ORAL
  Filled 2021-11-12 (×5): qty 1

## 2021-11-12 MED ORDER — IOHEXOL 350 MG/ML SOLN
60.0000 mL | Freq: Once | INTRAVENOUS | Status: AC | PRN
  Administered 2021-11-12: 60 mL via INTRAVENOUS

## 2021-11-12 MED ORDER — HYDRALAZINE HCL 20 MG/ML IJ SOLN: 10.0000 mg | INTRAMUSCULAR | Status: DC | PRN

## 2021-11-12 MED ORDER — HALOPERIDOL LACTATE 5 MG/ML IJ SOLN
2.0000 mg | Freq: Once | INTRAMUSCULAR | Status: AC
  Filled 2021-11-12: qty 1

## 2021-11-12 MED ORDER — SODIUM CHLORIDE (PF) 0.9 % IJ SOLN
INTRAMUSCULAR | Status: AC
  Filled 2021-11-12: qty 50

## 2021-11-12 NOTE — Progress Notes (Signed)
PROGRESS NOTE    Charles Weiss [place].  XHB:[id number] DOB: 1951-04-25 DOA: 11/02/2021 PCP: System, Provider Not In   Brief Narrative:  70 year old with history of asthma, HTN comes to the hospital complains of neck pain and headache.  He was found to have MRSA bacteremia therefore infectious disease was consulted.  He was started on vancomycin.  Work-up revealed cervical epidural   Assessment & Plan:   Principal Problem:   Sepsis ([place]) Active Problems:   Hypertension   Hyperlipidemia   Asthma   Acute respiratory failure with hypoxia (HCC)   Vertebral osteomyelitis (HCC)   MRSA bacteremia   Retropharyngeal abscess   Right lower quadrant abdominal pain  Sepsis secondary to MRSA bacteremia Discitis/vertebral osteomyelitis - Patient is currently on IV Teflaro and daptomycin - Surveillance cultures - Will need PICC line once bacteremia is cleared - 2D echocardiogram unremarkable, will need TEE once cleared by ENT  Retropharyngeal edema - Seen by ENT.  Initially treated with Decadron and IV antibiotics. - CT neck with contrast-showed worsening of cervical spine abscess.  Spoke with radiologist, MRI with contrast of C-spine has been ordered no evidence of osteomyelitis or discitis.  Cervical spine epidural abscess Mild pharyngitis -MRI C-spine with contrast ordered.  We will consult neurosurgery.    Abdominal Pain -Resolved   Acute Encephalopathy with Agitation, improved -This is fluctuating.  He is on bedtime 25 mg of Seroquel.   Hypertension -Patient is on losartan 50 mg daily.  IV as needed's as necessary.   Hyperlipidemia -Resume statin when able      Asthma -As needed bronchodilators   Hyperbilirubinemia -Resolved   Normocytic Anemia --Anemia of chronic disease.  Continue to closely monitor.   Obesity -Weight loss and outpatient dietary counseling     DVT prophylaxis: Lovenox Code Status: DNR Family Communication:    Status is:  Inpatient  Remains inpatient appropriate because: Ongoing evaluation for cervical epidural abscess         Subjective: Seen and examined at bedside, overall feeling better.  Still has some neck discomfort.  Review of Systems Otherwise negative except as per HPI, including: General: Denies fever, chills, night sweats or unintended weight loss. Resp: Denies cough, wheezing, shortness of breath. Cardiac: Denies chest pain, palpitations, orthopnea, paroxysmal nocturnal dyspnea. GI: Denies abdominal pain, nausea, vomiting, diarrhea or constipation GU: Denies dysuria, frequency, hesitancy or incontinence MS: Denies muscle aches, joint pain or swelling Neuro: Denies headache, neurologic deficits (focal weakness, numbness, tingling), abnormal gait Psych: Denies anxiety, depression, SI/HI/AVH Skin: Denies new rashes or lesions ID: Denies sick contacts, exotic exposures, travel  Examination:  General exam: Appears calm and comfortable  Respiratory system: Clear to auscultation. Respiratory effort normal. Cardiovascular system: S1 & S2 heard, RRR. No JVD, murmurs, rubs, gallops or clicks. No pedal edema. Gastrointestinal system: Abdomen is nondistended, soft and nontender. No organomegaly or masses felt. Normal bowel sounds heard. Central nervous system: Alert and oriented. No focal neurological deficits. Extremities: Symmetric 5 x 5 power. Skin: No rashes, lesions or ulcers Psychiatry: Judgement and insight appear normal. Mood & affect appropriate.     Objective: Vitals:   11/11/21 2014 11/12/21 0645 11/12/21 0750 11/12/21 0752  BP:  (!) 148/79    Pulse:  87    Resp:  16    Temp:  98.3 F (36.8 C)    TempSrc:  Oral    SpO2: 96% 96% 96% 96%  Weight:      Height:        Intake/Output Summary (  Last 24 hours) at 11/12/2021 0930 Last data filed at 11/12/2021 0439 Gross per 24 hour  Intake 1162.38 ml  Output 2400 ml  Net -1237.62 ml   Filed Weights   11/02/21 0701  11/11/21 1212  Weight: 113.4 kg 112.4 kg     Data Reviewed:   CBC: Recent Labs  Lab 11/08/21 0451 11/09/21 0623 11/10/21 0431 11/11/21 0434 11/12/21 0332  WBC 14.3* 11.2* 9.5 8.7 7.6  NEUTROABS 12.4* 9.5* 7.7 6.9 6.0  HGB 11.2* 9.6* 9.3* 8.5* 8.8*  HCT 33.3* 27.8* 27.1* 25.4* 26.1*  MCV 90.2 86.6 89.7 91.0 91.6  PLT 229 218 209 217 540   Basic Metabolic Panel: Recent Labs  Lab 11/08/21 0451 11/09/21 0623 11/10/21 0431 11/11/21 0434 11/12/21 0332  NA 134* 130* 132* 133* 130*  K 3.9 3.7 3.3* 4.3 4.0  CL 98 98 99 103 97*  CO2 24 25 26 24 26   GLUCOSE 125* 137* 137* 119* 104*  BUN 24* 19 19 15 12   CREATININE 0.63 0.54* 0.64 0.61 0.71  CALCIUM 8.8* 8.6* 8.3* 8.5* 8.7*  MG 2.1 1.8 2.1 1.9 1.8  PHOS 3.2 2.8 3.2 3.7 4.4   GFR: Estimated Creatinine Clearance: 111.2 mL/min (by C-G formula based on SCr of 0.71 mg/dL). Liver Function Tests: Recent Labs  Lab 11/08/21 0451 11/09/21 0623 11/10/21 0431 11/11/21 0434 11/12/21 0332  AST 33 45* 56* 38 29  ALT 32 48* 58* 52* 58*  ALKPHOS 68 73 72 70 67  BILITOT 2.4* 1.8* 1.2 0.9 0.9  PROT 7.4 6.9 6.6 6.4* 6.9  ALBUMIN 2.6* 2.3* 2.1* 2.1* 2.1*   No results for input(s): LIPASE, AMYLASE in the last 168 hours. No results for input(s): AMMONIA in the last 168 hours. Coagulation Profile: No results for input(s): INR, PROTIME in the last 168 hours. Cardiac Enzymes: Recent Labs  Lab 11/09/21 1310  CKTOTAL 122   BNP (last 3 results) No results for input(s): PROBNP in the last 8760 hours. HbA1C: No results for input(s): HGBA1C in the last 72 hours. CBG: Recent Labs  Lab 11/05/21 1626  GLUCAP 133*   Lipid Profile: No results for input(s): CHOL, HDL, LDLCALC, TRIG, CHOLHDL, LDLDIRECT in the last 72 hours. Thyroid Function Tests: No results for input(s): TSH, T4TOTAL, FREET4, T3FREE, THYROIDAB in the last 72 hours. Anemia Panel: No results for input(s): VITAMINB12, FOLATE, FERRITIN, TIBC, IRON, RETICCTPCT in the last  72 hours. Sepsis Labs: No results for input(s): PROCALCITON, LATICACIDVEN in the last 168 hours.  Recent Results (from the past 240 hour(s))  Blood culture (routine x 2)     Status: Abnormal   Collection Time: 11/02/21 12:12 PM   Specimen: BLOOD  Result Value Ref Range Status   Specimen Description   Final    BLOOD RIGHT ANTECUBITAL Performed at [facility] [address] [place], [place] [address]    Special Requests   Final    BOTTLES DRAWN AEROBIC AND ANAEROBIC Blood Culture results may not be optimal due to an excessive volume of blood received in culture bottles Performed at [facility] [address] [place], [place] [address]    Culture  Setup Time   Final    GRAM POSITIVE COCCI IN CLUSTERS IN BOTH AEROBIC AND ANAEROBIC BOTTLES CRITICAL RESULT CALLED TO, READ BACK BY AND VERIFIED WITH: M LILLISTON,PHARMD@0432  11/03/21 [place]    Culture (A)  Final    METHICILLIN RESISTANT STAPHYLOCOCCUS AUREUS SEE SEPARATE REPORT Performed at [facility], [address], [place], [place] [address]    Report Status 11/09/2021 FINAL  Final   Organism ID, Bacteria METHICILLIN RESISTANT STAPHYLOCOCCUS AUREUS  Final      Susceptibility   Methicillin resistant staphylococcus aureus - MIC*    CIPROFLOXACIN >=8 RESISTANT Resistant     ERYTHROMYCIN >=8 RESISTANT Resistant     GENTAMICIN <=0.5 SENSITIVE Sensitive     OXACILLIN >=4 RESISTANT Resistant     TETRACYCLINE <=1 SENSITIVE Sensitive     VANCOMYCIN 1 SENSITIVE Sensitive     TRIMETH/SULFA <=10 SENSITIVE Sensitive     CLINDAMYCIN <=0.25 SENSITIVE Sensitive     RIFAMPIN <=0.5 SENSITIVE Sensitive     Inducible Clindamycin NEGATIVE Sensitive     * METHICILLIN RESISTANT STAPHYLOCOCCUS AUREUS  Blood Culture ID Panel (Reflexed)     Status: Abnormal   Collection Time: 11/02/21 12:12 PM  Result Value Ref Range Status   Enterococcus faecalis NOT DETECTED NOT DETECTED Final   Enterococcus Faecium NOT  DETECTED NOT DETECTED Final   Listeria monocytogenes NOT DETECTED NOT DETECTED Final   Staphylococcus species DETECTED (A) NOT DETECTED Final    Comment: CRITICAL RESULT CALLED TO, READ BACK BY AND VERIFIED WITH: M LILLISTON,PHARMD@0431  11/03/21 [place]    Staphylococcus aureus (BCID) DETECTED (A) NOT DETECTED Final    Comment: Methicillin (oxacillin)-resistant Staphylococcus aureus (MRSA). MRSA is predictably resistant to beta-lactam antibiotics (except ceftaroline). Preferred therapy is vancomycin unless clinically contraindicated. Patient requires contact precautions if  hospitalized. CRITICAL RESULT CALLED TO, READ BACK BY AND VERIFIED WITH: M LILLISTON,PHARMD@0431  11/03/21 [place]    Staphylococcus epidermidis NOT DETECTED NOT DETECTED Final   Staphylococcus lugdunensis NOT DETECTED NOT DETECTED Final   Streptococcus species NOT DETECTED NOT DETECTED Final   Streptococcus agalactiae NOT DETECTED NOT DETECTED Final   Streptococcus pneumoniae NOT DETECTED NOT DETECTED Final   Streptococcus pyogenes NOT DETECTED NOT DETECTED Final   A.calcoaceticus-baumannii NOT DETECTED NOT DETECTED Final   Bacteroides fragilis NOT DETECTED NOT DETECTED Final   Enterobacterales NOT DETECTED NOT DETECTED Final   Enterobacter cloacae complex NOT DETECTED NOT DETECTED Final   Escherichia coli NOT DETECTED NOT DETECTED Final   Klebsiella aerogenes NOT DETECTED NOT DETECTED Final   Klebsiella oxytoca NOT DETECTED NOT DETECTED Final   Klebsiella pneumoniae NOT DETECTED NOT DETECTED Final   Proteus species NOT DETECTED NOT DETECTED Final   Salmonella species NOT DETECTED NOT DETECTED Final   Serratia marcescens NOT DETECTED NOT DETECTED Final   Haemophilus influenzae NOT DETECTED NOT DETECTED Final   Neisseria meningitidis NOT DETECTED NOT DETECTED Final   Pseudomonas aeruginosa NOT DETECTED NOT DETECTED Final   Stenotrophomonas maltophilia NOT DETECTED NOT DETECTED Final   Candida albicans NOT DETECTED NOT  DETECTED Final   Candida auris NOT DETECTED NOT DETECTED Final   Candida glabrata NOT DETECTED NOT DETECTED Final   Candida krusei NOT DETECTED NOT DETECTED Final   Candida parapsilosis NOT DETECTED NOT DETECTED Final   Candida tropicalis NOT DETECTED NOT DETECTED Final   Cryptococcus neoformans/gattii NOT DETECTED NOT DETECTED Final   Meth resistant mecA/C and MREJ DETECTED (A) NOT DETECTED Final    Comment: CRITICAL RESULT CALLED TO, READ BACK BY AND VERIFIED WITH: M LILLISTON,PHARMD@0431  11/03/21 [place] Performed at [facility], [address], [place], [place] [address]   Blood culture (routine x 2)     Status: Abnormal   Collection Time: 11/02/21 12:17 PM   Specimen: BLOOD  Result Value Ref Range Status   Specimen Description   Final    BLOOD  LEFT ANTECUBITAL Performed at [facility] [address], [place], [place] [address]    Special Requests   Final    BOTTLES DRAWN AEROBIC AND ANAEROBIC Blood Culture adequate volume Performed at [facility] [address] [place], [place] [address]    Culture  Setup Time   Final    GRAM POSITIVE COCCI IN CLUSTERS IN BOTH AEROBIC AND ANAEROBIC BOTTLES CRITICAL VALUE NOTED.  VALUE IS CONSISTENT WITH PREVIOUSLY REPORTED AND CALLED VALUE.    Culture (A)  Final    STAPHYLOCOCCUS AUREUS SUSCEPTIBILITIES PERFORMED ON PREVIOUS CULTURE WITHIN THE LAST 5 DAYS. Performed at [facility], [place] [address] [place], [place] [address]    Report Status 11/05/2021 FINAL  Final  Resp Panel by RT-PCR (Flu A&B, Covid) Nasopharyngeal Swab     Status: None   Collection Time: 11/02/21  4:16 PM   Specimen: Nasopharyngeal Swab; Nasopharyngeal(NP) swabs in vial transport medium  Result Value Ref Range Status   SARS Coronavirus 2 by RT PCR NEGATIVE NEGATIVE Final    Comment: (NOTE) SARS-CoV-2 target nucleic acids are NOT DETECTED.  The SARS-CoV-2 RNA is generally detectable in upper respiratory specimens  during the acute phase of infection. The lowest concentration of SARS-CoV-2 viral copies this assay can detect is 138 copies/mL. A negative result does not preclude SARS-Cov-2 infection and should not be used as the sole basis for treatment or other patient management decisions. A negative result may occur with  improper specimen collection/handling, submission of specimen other than nasopharyngeal swab, presence of viral mutation(s) within the areas targeted by this assay, and inadequate number of viral copies(<138 copies/mL). A negative result must be combined with clinical observations, patient history, and epidemiological information. The expected result is Negative.  Fact Sheet for Patients:  EntrepreneurPulse.com.au  Fact Sheet for Healthcare Providers:  IncredibleEmployment.be  This test is no t yet approved or cleared by the [place] FDA and  has been authorized for detection and/or diagnosis of SARS-CoV-2 by FDA under an Emergency Use Authorization (EUA). This EUA will remain  in effect (meaning this test can be used) for the duration of the COVID-19 declaration under Section 564(b)(1) of the Act, 21 U.S.C.section 360bbb-3(b)(1), unless the authorization is terminated  or revoked sooner.       Influenza A by PCR NEGATIVE NEGATIVE Final   Influenza B by PCR NEGATIVE NEGATIVE Final    Comment: (NOTE) The Xpert Xpress SARS-CoV-2/FLU/RSV plus assay is intended as an aid in the diagnosis of influenza from Nasopharyngeal swab specimens and should not be used as a sole basis for treatment. Nasal washings and aspirates are unacceptable for Xpert Xpress SARS-CoV-2/FLU/RSV testing.  Fact Sheet for Patients: EntrepreneurPulse.com.au  Fact Sheet for Healthcare Providers: IncredibleEmployment.be  This test is not yet approved or cleared by the [place] FDA and has been authorized for detection  and/or diagnosis of SARS-CoV-2 by FDA under an Emergency Use Authorization (EUA). This EUA will remain in effect (meaning this test can be used) for the duration of the COVID-19 declaration under Section 564(b)(1) of the Act, 21 U.S.C. section 360bbb-3(b)(1), unless the authorization is terminated or revoked.  Performed at [facility], [place] [address] [place], [place] [address]   Urine Culture     Status: Abnormal   Collection Time: 11/02/21  4:33 PM   Specimen: In/Out Cath Urine  Result Value Ref Range Status   Specimen Description   Final    IN/OUT CATH URINE Performed at  [facility], [place] [address] [place], [place] [address]    Special Requests   Final    NONE Performed at [facility], [place] [address], [place], [place] [address]    Culture (A)  Final    >=100,000 COLONIES/mL METHICILLIN RESISTANT STAPHYLOCOCCUS AUREUS   Report Status 11/05/2021 FINAL  Final   Organism ID, Bacteria METHICILLIN RESISTANT STAPHYLOCOCCUS AUREUS (A)  Final      Susceptibility   Methicillin resistant staphylococcus aureus - MIC*    CIPROFLOXACIN >=8 RESISTANT Resistant     GENTAMICIN <=0.5 SENSITIVE Sensitive     NITROFURANTOIN <=16 SENSITIVE Sensitive     OXACILLIN >=4 RESISTANT Resistant     TETRACYCLINE <=1 SENSITIVE Sensitive     VANCOMYCIN 1 SENSITIVE Sensitive     TRIMETH/SULFA <=10 SENSITIVE Sensitive     CLINDAMYCIN <=0.25 SENSITIVE Sensitive     RIFAMPIN <=0.5 SENSITIVE Sensitive     Inducible Clindamycin NEGATIVE Sensitive     * >=100,000 COLONIES/mL METHICILLIN RESISTANT STAPHYLOCOCCUS AUREUS  Culture, blood (routine x 2)     Status: None   Collection Time: 11/04/21  5:19 AM   Specimen: BLOOD RIGHT HAND  Result Value Ref Range Status   Specimen Description   Final    BLOOD RIGHT HAND Performed at [facility], [address] [place], [place] [address]    Special Requests   Final    BOTTLES DRAWN AEROBIC ONLY  Blood Culture adequate volume Performed at [facility] [address], [place], [place] [address]    Culture   Final    NO GROWTH 5 DAYS Performed at [facility], [place] [address], [place], [place] [address]    Report Status 11/09/2021 FINAL  Final  Culture, blood (routine x 2)     Status: None   Collection Time: 11/04/21  5:19 AM   Specimen: BLOOD LEFT HAND  Result Value Ref Range Status   Specimen Description   Final    BLOOD LEFT HAND Performed at [facility], [place] [address] [place], [place] [address]    Special Requests   Final    BOTTLES DRAWN AEROBIC ONLY Blood Culture adequate volume Performed at [facility] [address] [place], [place] [address]    Culture   Final    NO GROWTH 5 DAYS Performed at [facility], [place] [address] [place], [place] [address]    Report Status 11/09/2021 FINAL  Final  Culture, blood (routine x 2)     Status: None   Collection Time: 11/06/21  5:48 AM   Specimen: BLOOD RIGHT HAND  Result Value Ref Range Status   Specimen Description   Final    BLOOD RIGHT HAND Performed at [facility] [address] [place], [place] [address]    Special Requests   Final    BOTTLES DRAWN AEROBIC ONLY Blood Culture adequate volume Performed at [facility] [address], [place], [place] [address]    Culture   Final    NO GROWTH 5 DAYS Performed at [facility], [place] [address] [place], [place] [address]    Report Status 11/11/2021 FINAL  Final  Culture, blood (routine x 2)     Status: Abnormal   Collection Time: 11/06/21  5:52 AM   Specimen: BLOOD LEFT HAND  Result Value Ref Range Status   Specimen Description   Final    BLOOD LEFT HAND  Performed at [facility], [place] [address] [place], [place] [address]    Special Requests   Final    BOTTLES DRAWN AEROBIC AND ANAEROBIC Blood Culture adequate volume Performed at [facility] [address], [place], [place] [address]    Culture  Setup Time   Final    GRAM POSITIVE COCCI ANAEROBIC BOTTLE ONLY CRITICAL VALUE NOTED.  VALUE IS CONSISTENT WITH PREVIOUSLY REPORTED AND CALLED VALUE.    Culture (A)  Final    STAPHYLOCOCCUS AUREUS SUSCEPTIBILITIES PERFORMED ON PREVIOUS CULTURE WITHIN THE LAST 5 DAYS. Performed at [facility], [place] [address], [place], [place] [address]    Report Status 11/08/2021 FINAL  Final  Culture, blood (routine x 2)     Status: Abnormal   Collection Time: 11/07/21  9:28 AM   Specimen: BLOOD  Result Value Ref Range Status   Specimen Description   Final    BLOOD LEFT WRIST Performed at [facility] [address] [place], [place] [address]    Special Requests   Final    BOTTLES DRAWN AEROBIC AND ANAEROBIC Blood Culture adequate volume Performed at [facility] [address] [place], [place] [address]    Culture  Setup Time   Final    GRAM POSITIVE COCCI ANAEROBIC BOTTLE ONLY CRITICAL VALUE NOTED.  VALUE IS CONSISTENT WITH PREVIOUSLY REPORTED AND CALLED VALUE. Performed at [facility], [place] [address], [place], [place] [address]    Culture METHICILLIN RESISTANT STAPHYLOCOCCUS AUREUS (A)  Final   Report Status 11/11/2021 FINAL  Final  Culture, blood (routine x 2)     Status: Abnormal   Collection Time: 11/07/21  9:29 AM   Specimen: BLOOD RIGHT HAND  Result Value Ref Range Status   Specimen Description   Final    BLOOD RIGHT HAND Performed at [facility] [address], [place], [place] [address]    Special Requests   Final    BOTTLES DRAWN AEROBIC ONLY Blood Culture adequate volume Performed at [facility] [address], [place], [place] [address]    Culture  Setup Time   Final    GRAM POSITIVE COCCI AEROBIC BOTTLE ONLY CRITICAL VALUE NOTED.  VALUE IS CONSISTENT WITH PREVIOUSLY REPORTED AND CALLED VALUE.     Culture (A)  Final    STAPHYLOCOCCUS AUREUS SUSCEPTIBILITIES PERFORMED ON PREVIOUS CULTURE WITHIN THE LAST 5 DAYS. Performed at [facility], [place] [address] [place], [place] [address]    Report Status 11/09/2021 FINAL  Final  Culture, blood (routine x 2)     Status: None (Preliminary result)   Collection Time: 11/09/21 12:49 PM   Specimen: BLOOD  Result Value Ref Range Status   Specimen Description   Final    BLOOD RIGHT ANTECUBITAL Performed at [facility] [address], [place], [place] [address]    Special Requests   Final    BOTTLES DRAWN AEROBIC ONLY Blood Culture results may not be optimal due to an inadequate volume of blood received in culture bottles Performed at [facility] [address], [place], [place] [address]    Culture   Final    NO GROWTH 2 DAYS Performed at [facility] [address], [place], [place] [address]    Report Status PENDING  Incomplete  Culture, blood (routine x 2)     Status: None (Preliminary result)   Collection Time: 11/09/21  12:49 PM   Specimen: BLOOD  Result Value Ref Range Status   Specimen Description   Final    BLOOD BLOOD LEFT HAND Performed at [facility] [address], [place], [place] [address]    Special Requests   Final    BOTTLES DRAWN AEROBIC ONLY Blood Culture adequate volume Performed at [facility] [address], [place], [place] [address]    Culture   Final    NO GROWTH 2 DAYS Performed at [facility] [address] [place], [place] [address]    Report Status PENDING  Incomplete         Radiology Studies: No results found.      Scheduled Meds:  albuterol  2.5 mg Nebulization TID   Chlorhexidine Gluconate Cloth  6 each Topical Daily   enoxaparin (LOVENOX) injection  60 mg Subcutaneous Q24H   gabapentin  300 mg Oral TID   losartan  50 mg Oral Daily   mometasone-formoterol  2 puff Inhalation BID    pantoprazole  40 mg Oral Daily   QUEtiapine  25 mg Oral QHS   sodium chloride flush  10-40 mL Intracatheter Q12H   sodium chloride flush  3 mL Intravenous Q12H   Continuous Infusions:  sodium chloride 10 mL/hr at 11/03/21 1032   sodium chloride 75 mL/hr at 11/12/21 0006   ceFTAROline (TEFLARO) IV 600 mg (11/12/21 0430)   DAPTOmycin (CUBICIN)  IV 750 mg (11/11/21 2032)     LOS: 10 days   Time spent= 35 mins    Fern Canova Arsenio Loader, MD [facility]  If 7PM-7AM, please contact night-coverage  11/12/2021, 9:30 AM

## 2021-11-12 NOTE — Progress Notes (Signed)
Pharmacy Antibiotic Note  Charles Weiss. is a 70 y.o. male admitted on 11/02/2021 with MRSA bacteremia. Repeat blood cultures on 11/17 and 11/18 both with MRSA.  Pharmacy has been consulted to switch vancomycin to daptomycin and ceftaroline after discussion with ID.  11/12/21 WBC trending down to 7.6; creatinine wnl; remains afebrile. Daptomycin susceptibilities updated and is susceptible to MRSA  Plan:  Daptomycin 750mg  IV q24 hours (using adjusted body weight) Ceftaroline 600mg  IV q8 hours 11/20 CK level 122, check weekly thereafter TEE planned for 11/22 - postponed, needs clearance by ENT before undergoing TEE F/u further blood culture data  Height: 6' (182.9 cm) Weight: 112.4 kg (247 lb 12.8 oz) IBW/kg (Calculated) : 77.6  Temp (24hrs), Avg:98.3 F (36.8 C), Min:98.3 F (36.8 C), Max:98.4 F (36.9 C)  Recent Labs  Lab 11/06/21 0100 11/06/21 1000 11/07/21 0419 11/08/21 0451 11/09/21 0623 11/10/21 0431 11/11/21 0434 11/12/21 0332  WBC 16.9*  --    < > 14.3* 11.2* 9.5 8.7 7.6  CREATININE 0.76  --    < > 0.63 0.54* 0.64 0.61 0.71  VANCOTROUGH  --  8*  --   --   --   --   --   --   VANCOPEAK 19*  --   --   --   --   --   --   --    < > = values in this interval not displayed.     Estimated Creatinine Clearance: 111.2 mL/min (by C-G formula based on SCr of 0.71 mg/dL).    Allergies  Allergen Reactions   Elavil [Amitriptyline] Other (See Comments)   Tetracyclines & Related Other (See Comments)   Antimicrobials this admission: 11/13 Vanc >> 11/19 11/13 Cefepime >>11/14 11/13 Flagyl x1 in ED 11/14 Acyclovir >>11/14 11/14 Ampicillin >>11/14 11/20 Daptomycin >> 11/20 Ceftaroline >>  Dose adjustments this admission: 11/17 VP 19, VT 8 on 1 gm q12> incr to 1500 q12  Microbiology results: 11/13 BCx: 4/4 [facility] (BCID+MRSA) 11/13 UCx: >100K MRSA F 11/13 Resp PCR: negative for COVID/Influenza 11/15 BCx2: (aerobic only each "set") ngtd 11/17 BCx2: (aerobic  only each "set") 1/2 MRSA 11/18 Bcx2: 1/3 MRSA 11/20 Bcx2: ngtd  Thank you for allowing pharmacy to be a part of this patient's care.  Minda Ditto PharmD [facility] [phone] 11/12/2021, 2:52 PM

## 2021-11-12 NOTE — Progress Notes (Signed)
Patient ID: Charles Hu., male   DOB: [birth year], 70 y.o.   MRN: [id number]   Cervical and lumbar imaging have been reviewed. Epidural abscess C1-C2 with moderate spinal stenosis but no cord compression or cord signal changes. Prevertebral/retropharyngeal soft tissue edema is stable as compared to prior imaging. L4/5 has progressive changes of diskitis and osteomyelitis with severe spinal stenosis. Right facet joint at L5/S1 also with signs of osteomyelitis. Otherwise there is moderate degenerative and arthritic changes throughout the cervical and lumbar spine. Per the attending provider, the patient has no weakness or other neurological deficits. There are no acute neurosurgical needs at this time. Would recommend continued medical management.      Charles Moeller, DNP, NP-C 11/12/2021 3:53 PM

## 2021-11-12 NOTE — Progress Notes (Signed)
[name] for Infectious Disease    Date of Admission:  11/02/2021   Total days of antibiotics 11/day 5 ceftaroline & dapto          ID: Charles Weiss Charles Weiss. is a 70 y.o. male with prolonged MRSA bacteremia and vertebral epidural Principal Problem:   Sepsis ([place]) Active Problems:   Hypertension   Hyperlipidemia   Asthma   Acute respiratory failure with hypoxia ([place])   Vertebral osteomyelitis ([place])   MRSA bacteremia   Retropharyngeal abscess   Right lower quadrant abdominal pain    Subjective: Afebrile, still having neck pain and headache. No bowel or bladder incontinence. Pain limiting his lower extremity movement. Had neck CT that did not find any pharyngeal abscess however signals concerning for cervical epidural abscess worsening than prior imaging  Medications:   albuterol  2.5 mg Nebulization TID   Chlorhexidine Gluconate Cloth  6 each Topical Daily   enoxaparin (LOVENOX) injection  60 mg Subcutaneous Q24H   gabapentin  300 mg Oral TID   haloperidol lactate  2 mg Intravenous Once   losartan  50 mg Oral Daily   mometasone-formoterol  2 puff Inhalation BID   pantoprazole  40 mg Oral Daily   QUEtiapine  25 mg Oral QHS   sodium chloride (PF)       sodium chloride flush  10-40 mL Intracatheter Q12H   sodium chloride flush  3 mL Intravenous Q12H    Objective: Vital signs in last 24 hours: Temp:  [98.3 F (36.8 C)-98.4 F (36.9 C)] 98.3 F (36.8 C) (11/23 1325) Pulse Rate:  [84-87] 84 (11/23 1325) Resp:  [16] 16 (11/23 1325) BP: (145-149)/(79-93) 149/79 (11/23 1325) SpO2:  [95 %-96 %] 95 % (11/23 1325) Physical Exam  Constitutional: He is oriented to person, place, and time. He appears well-developed and well-nourished. No distress.  HENT:  Mouth/Throat: Oropharynx is clear and moist. No oropharyngeal exudate.  Cardiovascular: Normal rate, regular rhythm and normal heart sounds. Exam reveals no gallop and no friction rub.  No murmur heard.   Pulmonary/Chest: Effort normal and breath sounds normal. No respiratory distress. He has no wheezes.  Abdominal: Soft. Bowel sounds are normal. He exhibits no distension. There is no tenderness.  Lymphadenopathy:  He has no cervical adenopathy.  Neurological: He is alert and oriented to person, place, and time.  Skin: Skin is warm and dry. No rash noted. No erythema.  Psychiatric: He has a normal mood and affect. His behavior is normal.    Lab Results Recent Labs    11/11/21 0434 11/12/21 0332  WBC 8.7 7.6  HGB 8.5* 8.8*  HCT 25.4* 26.1*  NA 133* 130*  K 4.3 4.0  CL 103 97*  CO2 24 26  BUN 15 12  CREATININE 0.61 0.71   Liver Panel Recent Labs    11/11/21 0434 11/12/21 0332  PROT 6.4* 6.9  ALBUMIN 2.1* 2.1*  AST 38 29  ALT 52* 58*  ALKPHOS 70 67  BILITOT 0.9 0.9   Lab Results  Component Value Date   ESRSEDRATE 95 (H) 11/02/2021     Microbiology: 11/20 blood cx NGTD Studies/Results: CT SOFT TISSUE NECK W CONTRAST  Addendum Date: 11/12/2021   ADDENDUM REPORT: 11/12/2021 12:33 ADDENDUM: These results were called by telephone at the time of interpretation on 11/12/2021 at 12:28 pm to provider Reesa Chew , who verbally acknowledged these results. Electronically Signed   By: Franchot Gallo M.D.   On: 11/12/2021 12:33   Result Date:  11/12/2021 CLINICAL DATA:  Retropharyngeal edema.  History of prostate cancer EXAM: CT NECK WITH CONTRAST TECHNIQUE: Multidetector CT imaging of the neck was performed using the standard protocol following the bolus administration of intravenous contrast. CONTRAST:  22mL OMNIPAQUE IOHEXOL 350 MG/ML SOLN COMPARISON:  CT angio head neck 11/02/2021. MRI cervicothoracic and lumbar spine 11/13/2021 FINDINGS: Pharynx and larynx: Diffuse soft tissue swelling of the prevertebral soft tissues extending from the skull base to a proximally C3 or C4. No mass or prevertebral abscess identified. Airway intact. Epiglottis and larynx normal. Salivary glands: No  inflammation, mass, or stone. Thyroid: Negative Lymph nodes: No enlarged or pathologic lymph nodes in the neck. Vascular: Normal vascular enhancement Limited intracranial: Negative Visualized orbits: Incompletely evaluated. Mastoids and visualized paranasal sinuses: Paranasal sinuses clear. Mastoid sinus clear. Skeleton: Cervical spondylosis. Negative for cervical fracture or osteomyelitis. No evidence of discitis. There is progressive ventral epidural soft tissue thickening beginning at C1 and extending to C2-3. This is causing some cord flattening and mild spinal stenosis. Subtle low-density within the thickening may represent epidural abscess. This has progressed since the prior CT and MRI. Findings consistent with infection Upper chest: Lung apices clear bilaterally Other: None IMPRESSION: 1. Prevertebral soft tissue swelling. Progressive epidural thickening at C1 and C2 with central low density suggesting epidural abscess. This is causing cord flattening and mild spinal stenosis. Recommend repeat MRI cervical spine without and with contrast. The prior MRI was degraded by significant motion. Recommend appropriate sedation prior to MRI. 2. No evidence of discitis or osteomyelitis in the cervical spine. Electronically Signed: By: Franchot Gallo M.D. On: 11/12/2021 12:16     Assessment/Plan: MRSA bacteremia and vertebral epidural abscess = continue with daptomycin and ceftaroline. His worsening nuchal rigidity --CT suggesting that his cervical epidural abscess is evolving. Recommend to do MRI imaging of C/T/L to see extent of infection and if he meets surgical criteria for decompression since he had prolonged bacteremia. NSGY has been consulted. Awaiting MRI results.   Discussed plan with dr Reesa Chew.  [facility] Infect[facility] Pager: [phone]  11/12/2021, 2:10 PM

## 2021-11-13 DIAGNOSIS — J9601 Acute respiratory failure with hypoxia: Secondary | ICD-10-CM | POA: Diagnosis not present

## 2021-11-13 DIAGNOSIS — A4102 Sepsis due to Methicillin resistant Staphylococcus aureus: Secondary | ICD-10-CM | POA: Diagnosis not present

## 2021-11-13 DIAGNOSIS — J45909 Unspecified asthma, uncomplicated: Secondary | ICD-10-CM | POA: Diagnosis not present

## 2021-11-13 DIAGNOSIS — I10 Essential (primary) hypertension: Secondary | ICD-10-CM | POA: Diagnosis not present

## 2021-11-13 LAB — BASIC METABOLIC PANEL
Anion gap: 9 (ref 5–15)
BUN: 14 mg/dL (ref 8–23)
CO2: 26 mmol/L (ref 22–32)
Calcium: 8.7 mg/dL — ABNORMAL LOW (ref 8.9–10.3)
Chloride: 95 mmol/L — ABNORMAL LOW (ref 98–111)
Creatinine, Ser: 0.71 mg/dL (ref 0.61–1.24)
GFR, Estimated: >60 mL/min (ref >60)
Glucose, Bld: 108 mg/dL — ABNORMAL HIGH (ref 70–99)
Potassium: 3.9 mmol/L (ref 3.5–5.1)
Sodium: 130 mmol/L — ABNORMAL LOW (ref 135–145)

## 2021-11-13 LAB — CBC
HCT: 26 % — ABNORMAL LOW (ref 39.0–52.0)
Hemoglobin: 8.9 g/dL — ABNORMAL LOW (ref 13.0–17.0)
MCH: 30.9 pg (ref 26.0–34.0)
MCHC: 34.2 g/dL (ref 30.0–36.0)
MCV: 90.3 fL (ref 80.0–100.0)
Platelets: 275 10*3/uL (ref 150–400)
RBC: 2.88 MIL/uL — ABNORMAL LOW (ref 4.22–5.81)
RDW: 14.9 % (ref 11.5–15.5)
WBC: 8.2 10*3/uL (ref 4.0–10.5)
nRBC: 0 % (ref 0.0–0.2)

## 2021-11-13 LAB — MAGNESIUM: Magnesium: 1.8 mg/dL (ref 1.7–2.4)

## 2021-11-13 NOTE — TOC Progression Note (Signed)
Transition of Care (TOC) - Progression Note    Patient Details  Name: Charles Weiss. MRN: [id number] Date of Birth: Jan 01, 1951  Transition of [facility]) CM/SW Contact  Kathrina Crosley, Juliann Pulse, RN Phone Number: 11/13/2021, 11:50 AM  Clinical Narrative:  PT recc SNF-patient in agreement-faxed out await bed offers.     Expected Discharge Plan: [place] Barriers to Discharge: Continued Medical Work up  Expected Discharge Plan and Services Expected Discharge Plan: [place]   Discharge Planning Services: CM Consult   Living arrangements for the past 2 months: Single Family Home                                       Social Determinants of Health (SDOH) Interventions    Readmission Risk Interventions Readmission Risk Prevention Plan 11/03/2021  Transportation Screening Complete  PCP or Specialist Appt within 3-5 Days Complete  HRI or [place] Complete  Social Work Consult for [place] Planning/Counseling Complete  Palliative Care Screening Not Applicable  Medication Review [employer]) Complete

## 2021-11-13 NOTE — Progress Notes (Addendum)
PROGRESS NOTE    Charles Weiss.  CZY:[id number] DOB: May 18, 1951 DOA: 11/02/2021 PCP: System, Provider Not In   Brief Narrative:  70 year old with history of asthma, HTN comes to the hospital complains of neck pain and headache.  He was found to have MRSA bacteremia therefore infectious disease was consulted.  He was started on vancomycin.  Work-up revealed cervical epidural.  Repeat CT and MRI showed progression of osteomyelitis/discitis and epidural abscess in the lumbar and cervical region.  No cord compression was noted, neurosurgery was consulted.  He was recommended to continue medical management at this time.   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  There is definite progression of epidural abscess in the cervical region and discitis/osteomyelitis in the lumbar/sacral region.  Neurosurgery recommends medical management at this time.  Retropharyngeal edema - Seen by ENT.  Initially treated with Decadron and IV antibiotics. - CT neck with contrast-showed worsening of cervical spine abscess.     Abdominal Pain -Resolved   Acute Encephalopathy with Agitation, improved -This is fluctuating.  He is on bedtime 25 mg of Seroquel.   Hypertension -Patient is on losartan 50 mg daily.  IV as needed's as necessary.   Hyperlipidemia -Resume statin when able      Asthma -As needed bronchodilators   Hyperbilirubinemia -Resolved   Normocytic Anemia --Anemia of chronic  disease.  Continue to closely monitor.   Obesity -Weight loss and outpatient dietary counseling     DVT prophylaxis: Lovenox Code Status: DNR Family Communication:  Sister updated  Status is: Inpatient  Remains inpatient appropriate because: Ongoing evaluation for cervical epidural abscess    Subjective: This morning patient is reporting of lower back pain therefore he continues to have to reposition himself.  Review of Systems Otherwise negative except as per HPI, including: General = no fevers, chills, dizziness,  fatigue HEENT/EYES = negative for loss of vision, double vision, blurred vision,  sore throa Cardiovascular= negative for chest pain, palpitation Respiratory/lungs= negative for shortness of breath, cough, wheezing; hemoptysis,  Gastrointestinal= negative for nausea, vomiting, abdominal pain Genitourinary= negative for Dysuria MSK = Negative for arthralgia, myalgias Neurology= Negative for headache, numbness, tingling  Psychiatry= Negative for suicidal and homocidal ideation Skin= Negative for Rash  Examination: Constitutional: Not in acute distress Respiratory: Clear to auscultation bilaterally Cardiovascular: Normal sinus rhythm, no rubs Abdomen: Nontender nondistended good bowel sounds Musculoskeletal: No edema noted Skin: No rashes seen Neurologic: CN 2-12 grossly intact.  And nonfocal Psychiatric: Normal judgment and insight. Alert and oriented x 3. Normal mood.     Objective: Vitals:   11/12/21 1325 11/12/21 1954 11/12/21 2028 11/13/21 0558  BP: (!) 149/79 138/78  (!) 142/74  Pulse: 84 88  89  Resp: 16 16  16   Temp: 98.3 F (36.8 C) 98.7 F (37.1 C)  98.6 F (37 C)  TempSrc: Oral Oral  Oral  SpO2: 95% 92% 94% 96%  Weight:      Height:        Intake/Output Summary (  Last 24 hours) at 11/13/2021 0858 Last data filed at 11/13/2021 0600 Gross per 24 hour  Intake 526.58 ml  Output 700 ml  Net -173.42 ml   Filed Weights   11/02/21 0701  11/11/21 1212  Weight: 113.4 kg 112.4 kg     Data Reviewed:   CBC: Recent Labs  Lab 11/08/21 0451 11/09/21 0623 11/10/21 0431 11/11/21 0434 11/12/21 0332 11/13/21 0325  WBC 14.3* 11.2* 9.5 8.7 7.6 8.2  NEUTROABS 12.4* 9.5* 7.7 6.9 6.0  --   HGB 11.2* 9.6* 9.3* 8.5* 8.8* 8.9*  HCT 33.3* 27.8* 27.1* 25.4* 26.1* 26.0*  MCV 90.2 86.6 89.7 91.0 91.6 90.3  PLT 229 218 209 217 257 619   Basic Metabolic Panel: Recent Labs  Lab 11/08/21 0451 11/09/21 0623 11/10/21 0431 11/11/21 0434 11/12/21 0332 11/13/21 0325  NA 134* 130* 132* 133* 130* 130*  K 3.9 3.7 3.3* 4.3 4.0 3.9  CL 98 98 99 103 97* 95*  CO2 24 25 26 24 26 26   GLUCOSE 125* 137* 137* 119* 104* 108*  BUN 24* 19 19 15 12 14   CREATININE 0.63 0.54* 0.64 0.61 0.71 0.71  CALCIUM 8.8* 8.6* 8.3* 8.5* 8.7* 8.7*  MG 2.1 1.8 2.1 1.9 1.8 1.8  PHOS 3.2 2.8 3.2 3.7 4.4  --    GFR: Estimated Creatinine Clearance: 111.2 mL/min (by C-G formula based on SCr of 0.71 mg/dL). Liver Function Tests: Recent Labs  Lab 11/08/21 0451 11/09/21 0623 11/10/21 0431 11/11/21 0434 11/12/21 0332  AST 33 45* 56* 38 29  ALT 32 48* 58* 52* 58*  ALKPHOS 68 73 72 70 67  BILITOT 2.4* 1.8* 1.2 0.9 0.9  PROT 7.4 6.9 6.6 6.4* 6.9  ALBUMIN 2.6* 2.3* 2.1* 2.1* 2.1*   No results for input(s): LIPASE, AMYLASE in the last 168 hours. No results for input(s): AMMONIA in the last 168 hours. Coagulation Profile: No results for input(s): INR, PROTIME in the last 168 hours. Cardiac Enzymes: Recent Labs  Lab 11/09/21 1310  CKTOTAL 122   BNP (last 3 results) No results for input(s): PROBNP in the last 8760 hours. HbA1C: No results for input(s): HGBA1C in the last 72 hours. CBG: No results for input(s): GLUCAP in the last 168 hours.  Lipid Profile: No results for input(s): CHOL, HDL, LDLCALC, TRIG, CHOLHDL, LDLDIRECT in the last 72 hours. Thyroid Function Tests: No results for input(s): TSH, T4TOTAL, FREET4, T3FREE, THYROIDAB in the last 72  hours. Anemia Panel: No results for input(s): VITAMINB12, FOLATE, FERRITIN, TIBC, IRON, RETICCTPCT in the last 72 hours. Sepsis Labs: No results for input(s): PROCALCITON, LATICACIDVEN in the last 168 hours.  Recent Results (from the past 240 hour(s))  Culture, blood (routine x 2)     Status: None   Collection Time: 11/04/21  5:19 AM   Specimen: BLOOD RIGHT HAND  Result Value Ref Range Status   Specimen Description   Final    BLOOD RIGHT HAND Performed at [facility], [address] [place], [place] [address]    Special Requests   Final    BOTTLES DRAWN AEROBIC ONLY Blood Culture adequate volume Performed at [facility] [address] [place], [place] [address]    Culture   Final    NO GROWTH 5 DAYS Performed at [facility], [place] [address], [place], [place] [address]    Report Status 11/09/2021 FINAL  Final  Culture, blood (routine x 2)     Status: None   Collection Time:  11/04/21  5:19 AM   Specimen: BLOOD LEFT HAND  Result Value Ref Range Status   Specimen Description   Final    BLOOD LEFT HAND Performed at [facility], [place] [address], [place], [place] [address]    Special Requests   Final    BOTTLES DRAWN AEROBIC ONLY Blood Culture adequate volume Performed at [facility] [address], [place], [place] [address]    Culture   Final    NO GROWTH 5 DAYS Performed at [facility], [place] [address] [place], [place] [address]    Report Status 11/09/2021 FINAL  Final  Culture, blood (routine x 2)     Status: None   Collection Time: 11/06/21  5:48 AM   Specimen: BLOOD RIGHT HAND  Result Value Ref Range Status   Specimen Description   Final    BLOOD RIGHT HAND Performed at [facility] [address] [place], [place] [address]    Special Requests   Final    BOTTLES DRAWN AEROBIC ONLY Blood Culture adequate volume Performed at [facility] [address],  [place], [place] [address]    Culture   Final    NO GROWTH 5 DAYS Performed at [facility], [place] [address] [place], [place] [address]    Report Status 11/11/2021 FINAL  Final  Culture, blood (routine x 2)     Status: Abnormal   Collection Time: 11/06/21  5:52 AM   Specimen: BLOOD LEFT HAND  Result Value Ref Range Status   Specimen Description   Final    BLOOD LEFT HAND Performed at [facility] [address] [place], [place] [address]    Special Requests   Final    BOTTLES DRAWN AEROBIC AND ANAEROBIC Blood Culture adequate volume Performed at [facility] [address] [place], [place] [address]    Culture  Setup Time   Final    GRAM POSITIVE COCCI ANAEROBIC BOTTLE ONLY CRITICAL VALUE NOTED.  VALUE IS CONSISTENT WITH PREVIOUSLY REPORTED AND CALLED VALUE.    Culture (A)  Final    STAPHYLOCOCCUS AUREUS SUSCEPTIBILITIES PERFORMED ON PREVIOUS CULTURE WITHIN THE LAST 5 DAYS. Performed at [facility], [place] [address] [place], [place] [address]    Report Status 11/08/2021 FINAL  Final  Culture, blood (routine x 2)     Status: Abnormal   Collection Time: 11/07/21  9:28 AM   Specimen: BLOOD  Result Value Ref Range Status   Specimen Description   Final    BLOOD LEFT WRIST Performed at [facility] [address], [place], [place] [address]    Special Requests   Final    BOTTLES DRAWN AEROBIC AND ANAEROBIC Blood Culture adequate volume Performed at [facility] [address] [place], [place] [address]    Culture  Setup Time   Final    GRAM POSITIVE COCCI ANAEROBIC BOTTLE ONLY CRITICAL VALUE NOTED.  VALUE IS CONSISTENT WITH PREVIOUSLY REPORTED AND CALLED VALUE. Performed at [facility], [place] [address] [place],  [address]    Culture METHICILLIN RESISTANT STAPHYLOCOCCUS AUREUS (A)  Final   Report Status 11/11/2021 FINAL  Final  Culture, blood (routine x 2)     Status:  Abnormal   Collection Time: 11/07/21  9:29 AM   Specimen: BLOOD RIGHT HAND  Result Value Ref Range Status   Specimen Description   Final  BLOOD RIGHT HAND Performed at [facility], [place] [address], [place], [place] [address]    Special Requests   Final    BOTTLES DRAWN AEROBIC ONLY Blood Culture adequate volume Performed at [facility] [address], [place], [place] [address]    Culture  Setup Time   Final    GRAM POSITIVE COCCI AEROBIC BOTTLE ONLY CRITICAL VALUE NOTED.  VALUE IS CONSISTENT WITH PREVIOUSLY REPORTED AND CALLED VALUE.    Culture (A)  Final    STAPHYLOCOCCUS AUREUS SUSCEPTIBILITIES PERFORMED ON PREVIOUS CULTURE WITHIN THE LAST 5 DAYS. Performed at [facility], [place] [address] [place], [place] [address]    Report Status 11/09/2021 FINAL  Final  Culture, blood (routine x 2)     Status: None (Preliminary result)   Collection Time: 11/09/21 12:49 PM   Specimen: BLOOD  Result Value Ref Range Status   Specimen Description   Final    BLOOD RIGHT ANTECUBITAL Performed at [facility] [address] [place], [place] [address]    Special Requests   Final    BOTTLES DRAWN AEROBIC ONLY Blood Culture results may not be optimal due to an inadequate volume of blood received in culture bottles Performed at [facility] [address], [place], [place] [address]    Culture   Final    NO GROWTH 3 DAYS Performed at [facility], [place] [address] [place], [place] [address]    Report Status PENDING  Incomplete  Culture, blood (routine x 2)     Status: None (Preliminary result)   Collection Time: 11/09/21 12:49 PM   Specimen: BLOOD  Result Value Ref Range Status   Specimen Description   Final    BLOOD BLOOD LEFT HAND Performed at [facility] [address], [place], [place] [address]    Special Requests   Final    BOTTLES DRAWN AEROBIC ONLY Blood Culture  adequate volume Performed at [facility] [address], [place],  [address]    Culture   Final    NO GROWTH 3 DAYS Performed at [facility], [place] [address] [place],  [address]    Report Status PENDING  Incomplete         Radiology Studies: CT SOFT TISSUE NECK W CONTRAST  Addendum Date: 11/12/2021   ADDENDUM REPORT: 11/12/2021 12:33 ADDENDUM: These results were called by telephone at the time of interpretation on 11/12/2021 at 12:28 pm to provider Reesa Chew , who verbally acknowledged these results. Electronically Signed   By: Franchot Gallo M.D.   On: 11/12/2021 12:33   Result Date: 11/12/2021 CLINICAL DATA:  Retropharyngeal edema.  History of prostate cancer EXAM: CT NECK WITH CONTRAST TECHNIQUE: Multidetector CT imaging of the neck was performed using the standard protocol following the bolus administration of intravenous contrast. CONTRAST:  40mL OMNIPAQUE IOHEXOL 350 MG/ML SOLN COMPARISON:  CT angio head neck 11/02/2021. MRI cervicothoracic and lumbar spine 11/13/2021 FINDINGS: Pharynx and larynx: Diffuse soft tissue swelling of the prevertebral soft tissues extending from the skull base to a proximally C3 or C4. No mass or prevertebral abscess identified. Airway intact. Epiglottis and larynx normal. Salivary glands: No inflammation, mass, or stone. Thyroid: Negative Lymph nodes: No enlarged or pathologic lymph nodes in the neck. Vascular: Normal vascular enhancement Limited intracranial: Negative Visualized orbits: Incompletely evaluated. Mastoids and visualized paranasal sinuses: Paranasal sinuses clear. Mastoid sinus clear. Skeleton: Cervical spondylosis. Negative for cervical fracture  or osteomyelitis. No evidence of discitis. There is progressive ventral epidural soft tissue thickening beginning at C1 and extending to C2-3. This is causing some cord flattening and mild spinal stenosis. Subtle low-density within the thickening may represent epidural  abscess. This has progressed since the prior CT and MRI. Findings consistent with infection Upper chest: Lung apices clear bilaterally Other: None IMPRESSION: 1. Prevertebral soft tissue swelling. Progressive epidural thickening at C1 and C2 with central low density suggesting epidural abscess. This is causing cord flattening and mild spinal stenosis. Recommend repeat MRI cervical spine without and with contrast. The prior MRI was degraded by significant motion. Recommend appropriate sedation prior to MRI. 2. No evidence of discitis or osteomyelitis in the cervical spine. Electronically Signed: By: Franchot Gallo M.D. On: 11/12/2021 12:16   MR CERVICAL SPINE W WO CONTRAST  Result Date: 11/12/2021 CLINICAL DATA:  Acute neck pain with rotation and headache. Infection suspected. Concern of epidural abscess on CT neck. History of prostate cancer. EXAM: MRI CERVICAL SPINE WITHOUT AND WITH CONTRAST TECHNIQUE: Multiplanar and multiecho pulse sequences of the cervical spine, to include the craniocervical junction and cervicothoracic junction, were obtained without and with intravenous contrast. CONTRAST:  46mL GADAVIST GADOBUTROL 1 MMOL/ML IV SOLN COMPARISON:  MRI cervical spine 11/03/2021.  Neck CT 11/12/2021. FINDINGS: Despite efforts by the technologist and patient, mild motion artifact is present on today's exam and could not be eliminated. This reduces exam sensitivity and specificity. Alignment: Straightening without focal angulation or listhesis. Vertebrae: There is new marrow edema within the C2 vertebral body and odontoid process as well as the anterior arch of C1. In correlation with recent prior imaging, no definite fracture or cortical destruction identified in these areas. Stable endplate degenerative changes elsewhere in the cervical spine. Cord: Normal in signal and caliber. Posterior Fossa, vertebral arteries, paraspinal tissues: Diffuse prevertebral/retropharyngeal soft tissue swelling again noted  extending from the skull base to C5, similar to the original MRI. As seen on recent neck CT, there is increased ventral epidural soft tissue thickening and/or complex fluid at C1-2. This partially effaces the CSF surrounding the cord. No other epidural fluid collections are seen. Bilateral vertebral artery flow voids. Disc levels: Assessment of the disc space levels is limited by motion. As above, there is effacement of the CSF surrounding the cord at C1-2 by probable ventral epidural inflammatory changes. No cord deformity. C2-3: The disc appears normal. Asymmetric facet hypertrophy on the right without significant foraminal narrowing. C3-4: Spondylosis with loss of disc height, uncinate spurring and facet hypertrophy asymmetric to the left. No spinal stenosis or nerve root encroachment C4-5: Spondylosis with loss of disc height, uncinate spurring and asymmetric facet hypertrophy on the left. No cord deformity. Mild left foraminal narrowing. C5-6: Spondylosis with loss of disc height and bilateral uncinate spurring contributing to mild foraminal narrowing bilaterally. No cord deformity. C6-7: Spondylosis with loss of disc height and bilateral uncinate spurring. Mild foraminal narrowing bilaterally. No cord deformity. C7-T1: Spondylosis with loss of disc height and posterior osteophytes. No cord deformity. IMPRESSION: 1. As seen on neck CT earlier today, there is progressive anterior epidural soft tissue thickening or complex fluid at C1-2 associated with new marrow edema in the C1 and C2 vertebral bodies, suspicious for osteomyelitis and epidural abscess formation. The prevertebral/retropharyngeal soft tissue swelling is similar to the previous MRI of 9 days ago. 2. No cord compression or abnormal cord signal. 3. Multilevel cervical spondylosis without cord deformity or high-grade foraminal narrowing. 4. Lumbar spine findings dictated  separately. Electronically Signed   By: Richardean Sale M.D.   On: 11/12/2021  15:08   MR Lumbar Spine W Wo Contrast  Result Date: 11/12/2021 CLINICAL DATA:  Acute neck pain with rotation. Headache. Abnormal lumbar MRI suggesting discitis at L4-5. EXAM: MRI LUMBAR SPINE WITHOUT AND WITH CONTRAST TECHNIQUE: Multiplanar and multiecho pulse sequences of the lumbar spine were obtained without and with intravenous contrast. CONTRAST:  20mL GADAVIST GADOBUTROL 1 MMOL/ML IV SOLN COMPARISON:  Lumbar MRI 11/03/2021.  Abdominopelvic CT 11/05/2021. FINDINGS: Segmentation: Conventional anatomy assumed, with the last open disc space designated L5-S1. Alignment: Stable. There is straightening with a minimal degenerative anterolisthesis at L5-S1. Vertebrae: Although comparison is limited due to motion on previous MRI, there is evidence of increased bone marrow edema throughout the L4 and L5 vertebral bodies on the inversion recovery images, highly suspicious for osteomyelitis. There is associated progressive endplate irregularity and diffuse discal T2 hyperintensity. There are predominately fatty endplate degenerative changes at L3-4, without definite signs of discitis at that level. Possible findings of osteomyelitis adjacent to the right L5-S1 facet joint. The visualized sacroiliac joints appear unremarkable. Conus medullaris: Extends to the L1 level and appears normal. Paraspinal and other soft tissues: Mild anterior paraspinal inflammatory changes at L4-5. No focal anterior fluid collection identified. There is a fluid collection projecting posteriorly from the right L5-S1 facet joint, further described below. Disc levels: No significant disc space findings at T12-L1 or L1-2. L2-3: Mild disc bulging and bilateral facet hypertrophy. No significant spinal stenosis or nerve root encroachment. L3-4: Loss of disc height with annular disc bulging and endplate osteophytes. Mild facet and ligamentous hypertrophy. Resulting mild spinal stenosis with mild lateral recess and foraminal narrowing bilaterally.  L4-5: As above, progressive findings of discitis and osteomyelitis at this level. There is progressive annular disc bulging with ventral epidural soft tissue thickening and increased mass effect on the thecal sac. Moderate facet and ligamentous hypertrophy without definite evidence of facet joint infection. L5-S1: Chronic degenerative disc disease with annular disc bulging and a broad-based disc protrusion. There are marrow changes within the right L5-S1 facet joint with an enlarging posteriorly projecting fluid collection, measuring 1.9 cm on image 4/6. Underlying mild to moderate foraminal narrowing bilaterally, grossly stable. IMPRESSION: 1. Progressive changes of diskitis and osteomyelitis at L4-5 with ventral epidural thickening and increased mass effect on the thecal sac. 2. Possible changes of osteomyelitis involving the right L5-S1 facet joint with a posteriorly projecting small abscess or synovial cyst. 3. Stable endplate degenerative changes at L3-4 without definite signs of discitis or osteomyelitis. 4. Multilevel spondylosis as detailed above. Electronically Signed   By: Richardean Sale M.D.   On: 11/12/2021 15:22        Scheduled Meds:  albuterol  2.5 mg Nebulization TID   Chlorhexidine Gluconate Cloth  6 each Topical Daily   enoxaparin (LOVENOX) injection  60 mg Subcutaneous Q24H   gabapentin  300 mg Oral TID   losartan  50 mg Oral Daily   mometasone-formoterol  2 puff Inhalation BID   pantoprazole  40 mg Oral Daily   QUEtiapine  25 mg Oral QHS   sodium chloride flush  10-40 mL Intracatheter Q12H   sodium chloride flush  3 mL Intravenous Q12H   Continuous Infusions:  sodium chloride 10 mL/hr at 11/03/21 1032   ceFTAROline (TEFLARO) IV 600 mg (11/13/21 0858)   DAPTOmycin (CUBICIN)  IV 750 mg (11/12/21 2042)     LOS: 11 days   Time spent= 35 mins  Jamilette Suchocki Arsenio Loader, MD [facility]  If 7PM-7AM, please contact night-coverage  11/13/2021, 8:58 AM

## 2021-11-13 NOTE — NC FL2 (Signed)
[place] LEVEL OF CARE SCREENING TOOL     IDENTIFICATION  Patient Name: Charles Weiss. Birthdate: 10-26-1951 Sex: male Admission Date (Current Location): 11/02/2021  [facility] and [place] Number:  Herbalist and Address:  [facility],  [place], [place]      Provider Number: [id number]  Attending Physician Name and Address:  Damita Lack, MD  Relative Name and Phone Number:  Cletis [place] sister [phone]    Current Level of Care: Hospital Recommended Level of Care: [place] Prior Approval Number:    Date Approved/Denied:   PASRR Number: [id number]  Discharge Plan: SNF    Current Diagnoses: Patient Active Problem List   Diagnosis Date Noted   Right lower quadrant abdominal pain    Vertebral osteomyelitis ([place])    MRSA bacteremia    Retropharyngeal abscess    Sepsis ([place]) 11/02/2021   Asthma 10/25/2021   Acute respiratory failure with hypoxia ([place]) 10/25/2021   Influenza A 10/24/2021   Hypertension    Hyperlipidemia    Hypokalemia    Normocytic anemia    Hyponatremia     Orientation RESPIRATION BLADDER Height & Weight     Self, Time, Situation, Place  Normal Continent Weight: 112.4 kg Height:  6' (182.9 cm)  BEHAVIORAL SYMPTOMS/MOOD NEUROLOGICAL BOWEL NUTRITION STATUS      Continent Diet (Regular)  AMBULATORY STATUS COMMUNICATION OF NEEDS Skin   Limited Assist Verbally Normal                       Personal Care Assistance Level of Assistance  Bathing, Feeding, Dressing Bathing Assistance: Limited assistance Feeding assistance: Limited assistance Dressing Assistance: Limited assistance     Functional Limitations Info  Sight, Hearing, Speech Sight Info: Adequate Hearing Info: Adequate Speech Info: Adequate    SPECIAL CARE FACTORS FREQUENCY  PT (By licensed PT), OT (By licensed OT)     PT Frequency:  (5x week) OT Frequency:  (5x week)             Contractures Contractures Info: Not present    Additional Factors Info  Code Status, Allergies, Isolation Precautions (MRSA-Contact precautions.) Code Status Info:  (DNR) Allergies Info:  (Elavil;Tetracyclines.)           Current Medications (11/13/2021):  This is the current hospital active medication list Current Facility-Administered Medications  Medication Dose Route Frequency Provider Last Rate Last Admin   0.9 %  sodium chloride infusion   Intravenous PRN Kathie Dike, MD 10 mL/hr at 11/03/21 1032 New Bag at 11/03/21 1032   acetaminophen (TYLENOL) tablet 650 mg  650 mg Oral Q6H PRN Lenore Cordia, MD   650 mg at 11/07/21 0421   Or   acetaminophen (TYLENOL) suppository 650 mg  650 mg Rectal Q6H PRN Lenore Cordia, MD       albuterol (PROVENTIL) (2.5 MG/3ML) 0.083% nebulizer solution 2.5 mg  2.5 mg Nebulization TID Alfredia Ferguson, Omair Latif, DO   2.5 mg at 11/13/21 8546   ceftaroline (TEFLARO) 600 mg in sodium chloride 0.9 % 100 mL IVPB  600 mg Intravenous Q8H Dimple Nanas, RPH 100 mL/hr at 11/13/21 0858 600 mg at 11/13/21 0858   Chlorhexidine Gluconate Cloth 2 % PADS 6 each  6 each Topical Daily Raiford Noble Sanford, DO   6 each at 11/13/21 1052   DAPTOmycin (CUBICIN) 750 mg in sodium chloride 0.9 % IVPB  8 mg/kg (Adjusted) Intravenous Q2000  Dimple Nanas, RPH 130 mL/hr at 11/12/21 2042 750 mg at 11/12/21 2042   enoxaparin (LOVENOX) injection 60 mg  60 mg Subcutaneous Q24H Kathie Dike, MD   60 mg at 11/12/21 1825   gabapentin (NEURONTIN) capsule 300 mg  300 mg Oral TID Raiford Noble Latif, DO   300 mg at 11/13/21 0858   guaiFENesin (ROBITUSSIN) 100 MG/5ML liquid 5 mL  5 mL Oral Q4H PRN Amin, Ankit Chirag, MD       haloperidol lactate (HALDOL) injection 2 mg  2 mg Intravenous Q6H PRN Alfredia Ferguson, Omair Latif, DO   2 mg at 11/12/21 1325   hydrALAZINE (APRESOLINE) injection 10 mg  10 mg Intravenous Q4H PRN Amin, Ankit Chirag, MD       ipratropium-albuterol (DUONEB)  0.5-2.5 (3) MG/3ML nebulizer solution 3 mL  3 mL Nebulization Q4H PRN Amin, Ankit Chirag, MD       lip balm (CARMEX) ointment   Topical PRN Sheikh, Omair Latif, DO       losartan (COZAAR) tablet 50 mg  50 mg Oral Daily Zada Finders R, MD   50 mg at 11/13/21 0858   metoprolol tartrate (LOPRESSOR) injection 5 mg  5 mg Intravenous Q4H PRN Amin, Ankit Chirag, MD       mometasone-formoterol (DULERA) 200-5 MCG/ACT inhaler 2 puff  2 puff Inhalation BID Lenore Cordia, MD   2 puff at 11/13/21 0655   morphine 2 MG/ML injection 2 mg  2 mg Intravenous Q4H PRN Raiford Noble Latif, DO   2 mg at 11/12/21 1948   naloxone (NARCAN) injection 0.4 mg  0.4 mg Intravenous PRN Lenore Cordia, MD       ondansetron (ZOFRAN) tablet 4 mg  4 mg Oral Q6H PRN Lenore Cordia, MD       Or   ondansetron (ZOFRAN) injection 4 mg  4 mg Intravenous Q6H PRN Lenore Cordia, MD       oxyCODONE (Oxy IR/ROXICODONE) immediate release tablet 5 mg  5 mg Oral Q4H PRN Amin, Jeanella Flattery, MD   5 mg at 11/13/21 0908   oxyCODONE-acetaminophen (PERCOCET/ROXICET) 5-325 MG per tablet 1-2 tablet  1-2 tablet Oral Q4H PRN Kathie Dike, MD   2 tablet at 11/12/21 0645   pantoprazole (PROTONIX) EC tablet 40 mg  40 mg Oral Daily Raiford Noble Garwood, DO   40 mg at 11/13/21 0858   QUEtiapine (SEROQUEL) tablet 25 mg  25 mg Oral QHS Sheikh, Georgina Quint [place], DO   25 mg at 11/12/21 2248   senna-docusate (Senokot-S) tablet 1 tablet  1 tablet Oral QHS PRN Lenore Cordia, MD       sodium chloride flush (NS) 0.9 % injection 10-40 mL  10-40 mL Intracatheter Q12H Sheikh, Omair Latif, DO       sodium chloride flush (NS) 0.9 % injection 10-40 mL  10-40 mL Intracatheter PRN Sheikh, Omair Latif, DO       sodium chloride flush (NS) 0.9 % injection 3 mL  3 mL Intravenous Q12H Zada Finders R, MD   3 mL at 11/12/21 2249   traZODone (DESYREL) tablet 50 mg  50 mg Oral QHS PRN Damita Lack, MD         Discharge Medications: Please see discharge summary for a  list of discharge medications.  Relevant Imaging Results:  Relevant Lab Results:   Additional Information  [id number])  Wenceslao Loper, Juliann Pulse, RN

## 2021-11-13 NOTE — Plan of Care (Signed)
  Problem: Nutrition: Goal: Adequate nutrition will be maintained Outcome: Progressing   Problem: Coping: Goal: Level of anxiety will decrease Outcome: Progressing   Problem: Pain Managment: Goal: General experience of comfort will improve Outcome: Progressing   

## 2021-11-14 ENCOUNTER — Inpatient Hospital Stay (HOSPITAL_COMMUNITY): Payer: Medicare Other

## 2021-11-14 DIAGNOSIS — R1031 Right lower quadrant pain: Secondary | ICD-10-CM | POA: Diagnosis not present

## 2021-11-14 DIAGNOSIS — A4102 Sepsis due to Methicillin resistant Staphylococcus aureus: Secondary | ICD-10-CM | POA: Diagnosis not present

## 2021-11-14 DIAGNOSIS — E785 Hyperlipidemia, unspecified: Secondary | ICD-10-CM | POA: Diagnosis not present

## 2021-11-14 DIAGNOSIS — R7881 Bacteremia: Secondary | ICD-10-CM | POA: Diagnosis not present

## 2021-11-14 LAB — CBC
HCT: 26.5 % — ABNORMAL LOW (ref 39.0–52.0)
Hemoglobin: 9 g/dL — ABNORMAL LOW (ref 13.0–17.0)
MCH: 30.8 pg (ref 26.0–34.0)
MCHC: 34 g/dL (ref 30.0–36.0)
MCV: 90.8 fL (ref 80.0–100.0)
Platelets: 307 10*3/uL (ref 150–400)
RBC: 2.92 MIL/uL — ABNORMAL LOW (ref 4.22–5.81)
RDW: 14.9 % (ref 11.5–15.5)
WBC: 8.1 10*3/uL (ref 4.0–10.5)
nRBC: 0 % (ref 0.0–0.2)

## 2021-11-14 LAB — BASIC METABOLIC PANEL
Anion gap: 8 (ref 5–15)
BUN: 22 mg/dL (ref 8–23)
CO2: 26 mmol/L (ref 22–32)
Calcium: 8.9 mg/dL (ref 8.9–10.3)
Chloride: 96 mmol/L — ABNORMAL LOW (ref 98–111)
Creatinine, Ser: 0.96 mg/dL (ref 0.61–1.24)
GFR, Estimated: >60 mL/min (ref >60)
Glucose, Bld: 124 mg/dL — ABNORMAL HIGH (ref 70–99)
Potassium: 4.6 mmol/L (ref 3.5–5.1)
Sodium: 130 mmol/L — ABNORMAL LOW (ref 135–145)

## 2021-11-14 LAB — CULTURE, BLOOD (ROUTINE X 2)
Culture: NO GROWTH
Culture: NO GROWTH
Special Requests: ADEQUATE

## 2021-11-14 LAB — MAGNESIUM: Magnesium: 2.1 mg/dL (ref 1.7–2.4)

## 2021-11-14 MED ORDER — OXYCODONE HCL 5 MG PO TABS
10.0000 mg | ORAL_TABLET | ORAL | Status: DC | PRN
  Administered 2021-11-15 – 2021-11-16 (×2): 10 mg via ORAL
  Filled 2021-11-14 (×2): qty 2

## 2021-11-14 MED ORDER — HALOPERIDOL LACTATE 5 MG/ML IJ SOLN: 2.0000 mg | Freq: Four times a day (QID) | INTRAMUSCULAR | Status: DC | PRN

## 2021-11-14 NOTE — TOC Progression Note (Signed)
Transition of Care (TOC) - Progression Note    Patient Details  Name: Charles Weiss. MRN: [id number] Date of Birth: [birth year]  Transition of Care [facility]) CM/SW Contact  Chandra Asher, Juliann Pulse, RN Phone Number: 11/14/2021, 3:50 PM  Clinical Narrative: Bed offers given-await choice.    1. 1.3 mi [facility] [place], [place] [address] [phone] Overall rating Above average 2. 1.6 mi [facility] at [place], [place] [address] [phone] Overall rating Much below average 3. 2.1 mi [place], [place] [address] [phone] Overall rating Much below average 4. 2.5 mi [facility] [place], [place] [address] [phone] Overall rating Below average 5. 2.8 mi [facility] at the [place], [place] [address] [phone] Overall rating Average 6. 2.8 mi [place] [address] [place], [place] [address] [phone] Overall rating Much below average 7. 3 mi [facility] [place], [place] [address] [phone] Overall rating Above average 8. 3.6 [place] [address] [place], [place] [address] [phone] Overall rating Average 9. 3.6 mi [facility] [address] [place], [place] [address] [phone] Overall rating Much below average 10. 3.9 mi [facility] [place], [place] [address] [phone] Overall rating Much below average 11. 4.4 mi [facility] [place], [place] [address] [phone] Overall rating Much above average 12. 4.6 mi [facility] [address] [place],  [place] [address] [phone] Overall rating Much above average 13. 5.5 mi [facility] [address] [place], [place] [address] [phone] Overall rating Above average 14. 8.2 [facility] [place], [place] [address] [phone] Overall rating Much above average 15. 9 mi The [facility] [address] [place], [place] [address] [phone] Overall rating Below average 16. 9.1 [place] and [place], [place] [address] [phone] Overall rating Much below average 17. 9.2 mi [facility] [address] [place], [place] [address] [phone] Overall rating Much above average 18. 10.8 mi [place] at [facility] [address] [place], [place] [address] [phone] Overall rating Much above average 19. 12.6 mi [facility] [address] [place], [place] [address] [phone] Overall rating Much below average 20. 12.8 [facility] [place], [place] [address] [phone] Overall rating Much below average 21. 14.2 mi The [place] [facility] [address] [place], [place] [address] [phone] Overall rating Much below average 22. 14.4 mi [facility] at [place], [place] [address] [phone] Overall rating Above average 23. 14.8 mi [place] [facility] [place], [place] [address] [phone] Overall rating Much above average 24. 14.9 [place] [address] [place], [place] [address] [phone] Overall rating Much below average 25. 16.5 mi [facility] [address] [place], [place] [address] [phone] Overall rating Average 26. 16.7 mi [facility] [place], [place] [address] [phone] Overall rating Above average 27. 17.9 mi [facility] [place], [place] [address] [phone] Overall rating Average 28. 76.2 [facility] [address] [place], [place] [address] [phone] Overall rating Much below average 29. 19.7 mi [place] [address] [place], [place] [address] [phone] Overall rating Much below average 30. 20 mi [address] at the [facility] at [facility], [place] [address] [phone] Overall rating Much above average 31. 21.1 mi [facility] and [facility] [place], [place] [address] [phone] Overall rating Much below average 32. 21.6 [address] [place], [place] [address] [phone] Overall rating Below average 33. 38.8 [facility] [address] [place], [place] [address] [phone] Overall rating Below average 34. 21.8 [place], [place] [address] [phone] Overall rating Above average 35. [address] [place], [place] [address] [phone] Overall rating Much above average 36. 22.6 mi [facility] [address] [place], [place] [address] [phone] Overall rating Average 37. 22.7 mi [facility] [place], [place] [address] [phone] Overall rating Much below average 38. 23.3 mi [facility] [address] [place], [place] [address] [phone] Overall rating Above average 39. 23.5 [place], [place] [address] [phone] Overall rating Not available18 40. 24.1 mi [place] [address] [place], [place] [address] [phone] Overall rating Much below average 41. 24.2 mi [place] [address] [place], [place] [address] [phone] Overall rating Below average 42. 24.4 [facility] [address] [place],  [address] [phone] Overall rating Much below average 43. 24.5 mi [facility] [place],  [address] [phone] Overall rating Above average To explore and download nursing ho  Expected Discharge Plan: [place] Barriers to Discharge: Continued Medical Work up  Expected Discharge Plan and Services Expected Discharge Plan: [place]   Discharge Planning Services: CM Consult   Living arrangements for the past 2 months: Single Family Home                                       Social Determinants of Health (SDOH) Interventions    Readmission Risk Interventions Readmission Risk Prevention Plan 11/03/2021  Transportation Screening Complete  PCP or Specialist Appt within 3-5 Days Complete  HRI or [place] Complete  Social Work Consult for [place] Planning/Counseling Complete  Palliative Care Screening Not Applicable  Medication Review Press photographer) Complete

## 2021-11-14 NOTE — Progress Notes (Signed)
Pharmacy Antibiotic Note  Charles Weiss. is a 70 y.o. 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 Repeat blood cultures on 11/17 and 11/18 both with MRSA.  Pharmacy consulted by ID to switch vancomycin to daptomycin and ceftaroline.    Plan:  Continue Daptomycin 8mg /kg (750mg ) IV q24h (using adjusted body weight) Weekly CK (next on 11/27) Continue Ceftaroline 600mg  IV q8h Continue to monitor renal function, cultures, clinical course, ID recommendations    Height: 6' (182.9 cm) Weight: 112.4 kg (247 lb 12.8 oz) IBW/kg (Calculated) : 77.6  Temp (24hrs), Avg:97.9 F (36.6 C), Min:97.6 F (36.4 C), Max:98.1 F (36.7 C)  Recent Labs  Lab 11/10/21 0431 11/11/21 0434 11/12/21 0332 11/13/21 0325 11/14/21 0429  WBC 9.5 8.7 7.6 8.2 8.1  CREATININE 0.64 0.61 0.71 0.71 0.96     Estimated Creatinine Clearance: 92.7 mL/min (by C-G formula based on SCr of 0.96 mg/dL).    Allergies  Allergen Reactions   Elavil [Amitriptyline] Other (See Comments)   Tetracyclines & Related Other (See Comments)   Antimicrobials this admission: 11/13 Vanc >> 11/20 11/13 Cefepime >> 11/14 11/13 Flagyl x1 in ED 11/14 Acyclovir >> 11/14 11/14 Ampicillin >> 11/14 11/20 Ceftaroline >>  11/20 Daptomycin >>  Microbiology results: 11/13 BCx: 4/4 MRSA 11/13 UCx:  > 100K MRSA  11/13 Resp PCR: negative for COVID/Influenza 11/15 BCx: NGF 11/17 BCx: MRSA 11/18 BCx: MRSA 11/20 BCx: NGTD  Thank you for allowing pharmacy to be a part of this patient's care.   Lindell Spar, PharmD, BCPS [facility] [phone] 11/14/2021, 1:13 PM

## 2021-11-14 NOTE — Progress Notes (Signed)
PROGRESS NOTE    Charles Weiss.  HBZ:[id number] DOB: 12/13/1951 DOA: 11/02/2021 PCP: System, Provider Not In   Brief Narrative:  70 year old with history of asthma, HTN comes to the hospital complains of neck pain and headache.  He was found to have MRSA bacteremia therefore infectious disease was consulted.  He was started on vancomycin.  Work-up revealed cervical epidural.  Repeat CT and MRI showed progression of osteomyelitis/discitis and epidural abscess in the lumbar and cervical region.  No cord compression was noted, neurosurgery was consulted.  He was recommended to continue medical management at this time.   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  ID following. - 2D echo is overall unremarkable, will need TEE once cleared by ENT - ID is following.  There is definite progression of epidural abscess in the cervical region and discitis/osteomyelitis in the lumbar/sacral region.  Neurosurgery recommends medical management at this time. -Due to significant amount of back pain we will increase oxycodone IR to 10 mg  Retropharyngeal edema - Seen by ENT.  Initially treated with Decadron and IV antibiotics. - CT neck with contrast-showed worsening of cervical spine abscess.    Abdominal Pain -Resolved   Acute Encephalopathy with Agitation, improved -This is fluctuating.  He is on bedtime 25 mg of Seroquel.   Hypertension -Patient is on losartan 50 mg daily.  IV as needed's as necessary.   Hyperlipidemia -Resume statin when able      Asthma -As needed bronchodilators    Hyperbilirubinemia -Resolved   Normocytic Anemia --Anemia of chronic disease.  Continue to closely monitor.   Obesity -Weight loss and outpatient dietary counseling     DVT prophylaxis: Lovenox Code Status: DNR Family Communication:  Sister updated periodically  Status is: Inpatient  Remains inpatient appropriate because: Ongoing evaluation for cervical epidural abscess    Subjective: Still having significant mount of low back pain  Review of Systems Otherwise negative except as per HPI, including: General: Denies fever, chills, night sweats or unintended weight loss. Resp: Denies cough, wheezing, shortness of breath. Cardiac: Denies chest pain, palpitations, orthopnea, paroxysmal nocturnal dyspnea. GI: Denies abdominal pain, nausea, vomiting, diarrhea or constipation GU: Denies dysuria, frequency, hesitancy or incontinence MS: Denies muscle aches, joint pain or swelling Neuro: Denies headache, neurologic deficits (focal weakness, numbness, tingling), abnormal gait Psych: Denies anxiety, depression, SI/HI/AVH Skin: Denies new rashes or lesions ID: Denies sick contacts, exotic exposures, travel  Examination: Constitutional: Mild distress secondary to pain in his lower back Respiratory: Clear to auscultation bilaterally Cardiovascular: Normal sinus rhythm, no rubs Abdomen: Nontender nondistended good bowel sounds Musculoskeletal: No edema noted Skin: No rashes seen Neurologic: CN 2-12 grossly intact.  And nonfocal Psychiatric: Normal judgment and insight. Alert and oriented x 3. Normal mood. Objective: Vitals:   11/13/21 2016 11/13/21 2051 11/14/21 0444 11/14/21 0858  BP:  105/73 110/71   Pulse:  85 84   Resp:  18 18   Temp:  98.1 F (36.7 C) 97.6 F (36.4 C)   TempSrc:  Oral Oral   SpO2: 94% 94% 93% 95%  Weight:      Height:  Intake/Output Summary (Last 24 hours) at 11/14/2021 1135 Last data filed at 11/14/2021 0600 Gross per 24 hour  Intake 560  ml  Output 500 ml  Net 60 ml   Filed Weights   11/02/21 0701 11/11/21 1212  Weight: 113.4 kg 112.4 kg     Data Reviewed:   CBC: Recent Labs  Lab 11/08/21 0451 11/09/21 0623 11/10/21 0431 11/11/21 0434 11/12/21 0332 11/13/21 0325 11/14/21 0429  WBC 14.3* 11.2* 9.5 8.7 7.6 8.2 8.1  NEUTROABS 12.4* 9.5* 7.7 6.9 6.0  --   --   HGB 11.2* 9.6* 9.3* 8.5* 8.8* 8.9* 9.0*  HCT 33.3* 27.8* 27.1* 25.4* 26.1* 26.0* 26.5*  MCV 90.2 86.6 89.7 91.0 91.6 90.3 90.8  PLT 229 218 209 217 257 275 825   Basic Metabolic Panel: Recent Labs  Lab 11/08/21 0451 11/09/21 0623 11/10/21 0431 11/11/21 0434 11/12/21 0332 11/13/21 0325 11/14/21 0429  NA 134* 130* 132* 133* 130* 130* 130*  K 3.9 3.7 3.3* 4.3 4.0 3.9 4.6  CL 98 98 99 103 97* 95* 96*  CO2 24 25 26 24 26 26 26   GLUCOSE 125* 137* 137* 119* 104* 108* 124*  BUN 24* 19 19 15 12 14 22   CREATININE 0.63 0.54* 0.64 0.61 0.71 0.71 0.96  CALCIUM 8.8* 8.6* 8.3* 8.5* 8.7* 8.7* 8.9  MG 2.1 1.8 2.1 1.9 1.8 1.8 2.1  PHOS 3.2 2.8 3.2 3.7 4.4  --   --    GFR: Estimated Creatinine Clearance: 92.7 mL/min (by C-G formula based on SCr of 0.96 mg/dL). Liver Function Tests: Recent Labs  Lab 11/08/21 0451 11/09/21 0623 11/10/21 0431 11/11/21 0434 11/12/21 0332  AST 33 45* 56* 38 29  ALT 32 48* 58* 52* 58*  ALKPHOS 68 73 72 70 67  BILITOT 2.4* 1.8* 1.2 0.9 0.9  PROT 7.4 6.9 6.6 6.4* 6.9  ALBUMIN 2.6* 2.3* 2.1* 2.1* 2.1*   No results for input(s): LIPASE, AMYLASE in the last 168 hours. No results for input(s): AMMONIA in the last 168 hours. Coagulation Profile: No results for input(s): INR, PROTIME in the last 168 hours. Cardiac Enzymes: Recent Labs  Lab 11/09/21 1310  CKTOTAL 122   BNP (last 3 results) No results for input(s): PROBNP in the last 8760 hours. HbA1C: No results for input(s): HGBA1C in the last 72 hours. CBG: No results for input(s): GLUCAP in the last 168 hours.  Lipid Profile: No results for input(s): CHOL, HDL,  LDLCALC, TRIG, CHOLHDL, LDLDIRECT in the last 72 hours. Thyroid Function Tests: No results for input(s): TSH, T4TOTAL, FREET4, T3FREE, THYROIDAB in the last 72 hours. Anemia Panel: No results for input(s): VITAMINB12, FOLATE, FERRITIN, TIBC, IRON, RETICCTPCT in the last 72 hours. Sepsis Labs: No results for input(s): PROCALCITON, LATICACIDVEN in the last 168 hours.  Recent Results (from the past 240 hour(s))  Culture, blood (routine x 2)     Status: None   Collection Time: 11/06/21  5:48 AM   Specimen: BLOOD RIGHT HAND  Result Value Ref Range Status   Specimen Description   Final    BLOOD RIGHT HAND Performed at [facility] [address] [place], [place] [address]    Special Requests   Final    BOTTLES DRAWN AEROBIC ONLY Blood Culture adequate volume Performed at [facility] [address] [place], [place] [address]    Culture   Final    NO GROWTH 5 DAYS Performed at [facility], [place] [address], [place], [place]  [address]    Report Status 11/11/2021 FINAL  Final  Culture, blood (routine x 2)     Status: Abnormal   Collection Time: 11/06/21  5:52 AM   Specimen: BLOOD LEFT HAND  Result Value Ref Range Status   Specimen Description   Final    BLOOD LEFT HAND Performed at [facility] [address], [place], [place] [address]    Special Requests   Final    BOTTLES DRAWN AEROBIC AND ANAEROBIC Blood Culture adequate volume Performed at [facility] [address], [place], [place] [address]    Culture  Setup Time   Final    GRAM POSITIVE COCCI ANAEROBIC BOTTLE ONLY CRITICAL VALUE NOTED.  VALUE IS CONSISTENT WITH PREVIOUSLY REPORTED AND CALLED VALUE.    Culture (A)  Final    STAPHYLOCOCCUS AUREUS SUSCEPTIBILITIES PERFORMED ON PREVIOUS CULTURE WITHIN THE LAST 5 DAYS. Performed at [facility], [place] [address], [place], [place] [address]    Report Status 11/08/2021 FINAL  Final   Culture, blood (routine x 2)     Status: Abnormal   Collection Time: 11/07/21  9:28 AM   Specimen: BLOOD  Result Value Ref Range Status   Specimen Description   Final    BLOOD LEFT WRIST Performed at [facility] [address], [place], [place] [address]    Special Requests   Final    BOTTLES DRAWN AEROBIC AND ANAEROBIC Blood Culture adequate volume Performed at [facility] [address] [place], [place] [address]    Culture  Setup Time   Final    GRAM POSITIVE COCCI ANAEROBIC BOTTLE ONLY CRITICAL VALUE NOTED.  VALUE IS CONSISTENT WITH PREVIOUSLY REPORTED AND CALLED VALUE. Performed at [facility], [place] [address], [place], [place] [address]    Culture METHICILLIN RESISTANT STAPHYLOCOCCUS AUREUS (A)  Final   Report Status 11/11/2021 FINAL  Final  Culture, blood (routine x 2)     Status: Abnormal   Collection Time: 11/07/21  9:29 AM   Specimen: BLOOD RIGHT HAND  Result Value Ref Range Status   Specimen Description   Final    BLOOD RIGHT HAND Performed at [facility] [address], [place], [place] [address]    Special Requests   Final    BOTTLES DRAWN AEROBIC ONLY Blood Culture adequate volume Performed at [facility] [address], [place], [place] [address]    Culture  Setup Time   Final    GRAM POSITIVE COCCI AEROBIC BOTTLE ONLY CRITICAL VALUE NOTED.  VALUE IS CONSISTENT WITH PREVIOUSLY REPORTED AND CALLED VALUE.    Culture (A)  Final    STAPHYLOCOCCUS AUREUS SUSCEPTIBILITIES PERFORMED ON PREVIOUS CULTURE WITHIN THE LAST 5 DAYS. Performed at [facility], [place] [address], [place], [place] [address]    Report Status 11/09/2021 FINAL  Final  Culture, blood (routine x 2)     Status: None (Preliminary result)   Collection Time: 11/09/21 12:49 PM   Specimen: BLOOD  Result Value Ref Range Status   Specimen Description   Final    BLOOD RIGHT ANTECUBITAL Performed at [facility] [address], [place], [place] [address]    Special Requests   Final    BOTTLES DRAWN AEROBIC ONLY Blood Culture results may not be optimal due to an inadequate volume of blood received in culture bottles Performed at [facility] [address] [place], [place]  [address]    Culture   Final    NO GROWTH 4 DAYS Performed at [facility], [place] [address], [place], [place] [address]    Report Status PENDING  Incomplete  Culture, blood (routine x 2)     Status: None (Preliminary result)   Collection Time: 11/09/21 12:49 PM   Specimen: BLOOD  Result Value Ref Range Status   Specimen Description   Final    BLOOD BLOOD LEFT HAND Performed at [facility] [address], [place], [place] [address]    Special Requests   Final    BOTTLES DRAWN AEROBIC ONLY Blood Culture adequate volume Performed at [facility] [address], [place], [place] [address]    Culture   Final    NO GROWTH 4 DAYS Performed at [facility], [place] [address] [place], [place] [address]    Report Status PENDING  Incomplete         Radiology Studies: MR CERVICAL SPINE W WO CONTRAST  Result Date: 11/12/2021 CLINICAL DATA:  Acute neck pain with rotation and headache. Infection suspected. Concern of epidural abscess on CT neck. History of prostate cancer. EXAM: MRI CERVICAL SPINE WITHOUT AND WITH CONTRAST TECHNIQUE: Multiplanar and multiecho pulse sequences of the cervical spine, to include the craniocervical junction and cervicothoracic junction, were obtained without and with intravenous contrast. CONTRAST:  52mL GADAVIST GADOBUTROL 1 MMOL/ML IV SOLN COMPARISON:  MRI cervical spine 11/03/2021.  Neck CT 11/12/2021. FINDINGS: Despite efforts by the technologist and patient, mild motion artifact is present on today's exam and could not be eliminated. This reduces exam sensitivity and specificity. Alignment: Straightening  without focal angulation or listhesis. Vertebrae: There is new marrow edema within the C2 vertebral body and odontoid process as well as the anterior arch of C1. In correlation with recent prior imaging, no definite fracture or cortical destruction identified in these areas. Stable endplate degenerative changes elsewhere in the cervical spine. Cord: Normal in signal and caliber. Posterior Fossa, vertebral arteries, paraspinal tissues: Diffuse prevertebral/retropharyngeal soft tissue swelling again noted extending from the skull base to C5, similar to the original MRI. As seen on recent neck CT, there is increased ventral epidural soft tissue thickening and/or complex fluid at C1-2. This partially effaces the CSF surrounding the cord. No other epidural fluid collections are seen. Bilateral vertebral artery flow voids. Disc levels: Assessment of the disc space levels is limited by motion. As above, there is effacement of the CSF surrounding the cord at C1-2 by probable ventral epidural inflammatory changes. No cord deformity. C2-3: The disc appears normal. Asymmetric facet hypertrophy on the right without significant foraminal narrowing. C3-4: Spondylosis with loss of disc height, uncinate spurring and facet hypertrophy asymmetric to the left. No spinal stenosis or nerve root encroachment C4-5: Spondylosis with loss of disc height, uncinate spurring and asymmetric facet hypertrophy on the left. No cord deformity. Mild left foraminal narrowing. C5-6: Spondylosis with loss of disc height and bilateral uncinate spurring contributing to mild foraminal narrowing bilaterally. No cord deformity. C6-7: Spondylosis with loss of disc height and bilateral uncinate spurring. Mild foraminal narrowing bilaterally. No cord deformity. C7-T1: Spondylosis with loss of disc height and posterior osteophytes. No cord deformity. IMPRESSION: 1. As seen on neck CT earlier today, there is progressive anterior epidural soft tissue thickening  or complex fluid at C1-2 associated with new marrow edema in the C1 and C2 vertebral bodies, suspicious for osteomyelitis and epidural abscess formation. The prevertebral/retropharyngeal soft tissue  swelling is similar to the previous MRI of 9 days ago. 2. No cord compression or abnormal cord signal. 3. Multilevel cervical spondylosis without cord deformity or high-grade foraminal narrowing. 4. Lumbar spine findings dictated separately. Electronically Signed   By: Richardean Sale M.D.   On: 11/12/2021 15:08   MR Lumbar Spine W Wo Contrast  Result Date: 11/12/2021 CLINICAL DATA:  Acute neck pain with rotation. Headache. Abnormal lumbar MRI suggesting discitis at L4-5. EXAM: MRI LUMBAR SPINE WITHOUT AND WITH CONTRAST TECHNIQUE: Multiplanar and multiecho pulse sequences of the lumbar spine were obtained without and with intravenous contrast. CONTRAST:  45mL GADAVIST GADOBUTROL 1 MMOL/ML IV SOLN COMPARISON:  Lumbar MRI 11/03/2021.  Abdominopelvic CT 11/05/2021. FINDINGS: Segmentation: Conventional anatomy assumed, with the last open disc space designated L5-S1. Alignment: Stable. There is straightening with a minimal degenerative anterolisthesis at L5-S1. Vertebrae: Although comparison is limited due to motion on previous MRI, there is evidence of increased bone marrow edema throughout the L4 and L5 vertebral bodies on the inversion recovery images, highly suspicious for osteomyelitis. There is associated progressive endplate irregularity and diffuse discal T2 hyperintensity. There are predominately fatty endplate degenerative changes at L3-4, without definite signs of discitis at that level. Possible findings of osteomyelitis adjacent to the right L5-S1 facet joint. The visualized sacroiliac joints appear unremarkable. Conus medullaris: Extends to the L1 level and appears normal. Paraspinal and other soft tissues: Mild anterior paraspinal inflammatory changes at L4-5. No focal anterior fluid collection identified.  There is a fluid collection projecting posteriorly from the right L5-S1 facet joint, further described below. Disc levels: No significant disc space findings at T12-L1 or L1-2. L2-3: Mild disc bulging and bilateral facet hypertrophy. No significant spinal stenosis or nerve root encroachment. L3-4: Loss of disc height with annular disc bulging and endplate osteophytes. Mild facet and ligamentous hypertrophy. Resulting mild spinal stenosis with mild lateral recess and foraminal narrowing bilaterally. L4-5: As above, progressive findings of discitis and osteomyelitis at this level. There is progressive annular disc bulging with ventral epidural soft tissue thickening and increased mass effect on the thecal sac. Moderate facet and ligamentous hypertrophy without definite evidence of facet joint infection. L5-S1: Chronic degenerative disc disease with annular disc bulging and a broad-based disc protrusion. There are marrow changes within the right L5-S1 facet joint with an enlarging posteriorly projecting fluid collection, measuring 1.9 cm on image 4/6. Underlying mild to moderate foraminal narrowing bilaterally, grossly stable. IMPRESSION: 1. Progressive changes of diskitis and osteomyelitis at L4-5 with ventral epidural thickening and increased mass effect on the thecal sac. 2. Possible changes of osteomyelitis involving the right L5-S1 facet joint with a posteriorly projecting small abscess or synovial cyst. 3. Stable endplate degenerative changes at L3-4 without definite signs of discitis or osteomyelitis. 4. Multilevel spondylosis as detailed above. Electronically Signed   By: Richardean Sale M.D.   On: 11/12/2021 15:22        Scheduled Meds:  albuterol  2.5 mg Nebulization TID   Chlorhexidine Gluconate Cloth  6 each Topical Daily   enoxaparin (LOVENOX) injection  60 mg Subcutaneous Q24H   gabapentin  300 mg Oral TID   losartan  50 mg Oral Daily   mometasone-formoterol  2 puff Inhalation BID    pantoprazole  40 mg Oral Daily   QUEtiapine  25 mg Oral QHS   sodium chloride flush  10-40 mL Intracatheter Q12H   sodium chloride flush  3 mL Intravenous Q12H   Continuous Infusions:  sodium chloride 10 mL/hr at  11/03/21 1032   ceFTAROline (TEFLARO) IV 600 mg (11/14/21 7276)   DAPTOmycin (CUBICIN)  IV 750 mg (11/13/21 2006)     LOS: 12 days   Time spent= 35 mins    Keylin Podolsky Arsenio Loader, MD [facility]  If 7PM-7AM, please contact night-coverage  11/14/2021, 11:35 AM

## 2021-11-14 NOTE — Care Management Important Message (Signed)
Important Message  Patient Details IM Letter given to the Patient. Name: [name]. MRN: [id number] Date of Birth: September 10, 1951   Medicare Important Message Given:  Yes     Kerin Salen 11/14/2021, 2:05 PM

## 2021-11-14 NOTE — Progress Notes (Signed)
Physical Therapy Treatment Patient Details Name: Charles Weiss. MRN: [id number] DOB: Dec 05, 1951 Today's Date: 11/14/2021   History of Present Illness 70 yo male presents to [facility] on 11/13 with neck pain, headaches x3 days, recent diagnosis of flu with admission 11/4-8. CTA head/neck shows retropharyngeal edema without visible pharyngeal or spinal source, MRI shows spine diffuse lumbar epidural enhancement without abscess; L4-5 OM changes; C5-6 prevertebral enhancement no osseous finding. workup for sepsis and mrsa along with spinal osseous involvement/discitis. PMH includes HTN, HLD, asthma, GERD.    PT Comments    Pt agreeable to PT, was premedicated prior to PT session. Pt with very limited mobility d/t incr pain back and L hip with movement. Repositioned pt once returned to supine, comfortable per pt report on PT departure. Pt c/o primo-fit leaking urine, NT and RN aware.   Recommendations for follow up therapy are one component of a multi-disciplinary discharge planning process, led by the attending physician.  Recommendations may be updated based on patient status, additional functional criteria and insurance authorization.  Follow Up Recommendations  Skilled nursing-short term rehab (<3 hours/day)     Assistance Recommended at Discharge Frequent or constant Supervision/Assistance  Equipment Recommendations  None recommended by PT    Recommendations for Other Services       Precautions / Restrictions Precautions Precautions: Fall Precaution Comments: monitor O2     Mobility  Bed Mobility Overal bed mobility: Needs Assistance Bed Mobility: Rolling;Sidelying to Sit;Sit to Supine Rolling: Min assist Sidelying to sit: Mod assist;HOB elevated;+2 for safety/equipment;+2 for physical assistance;Min assist (HOB 30 degrees)   Sit to supine: Min assist;+2 for safety/equipment;Mod assist   General bed mobility comments: assist and cues for sequencing to bring LE's off EOB  and press trunk up, pt required HOB elevated to ~30* however unable to complete/maintain EOB d/t pain level. min assist to bring Bil LE's back onto bed to return to supine. pt able to assist self with scooting to [facility] in supine using UEs wtih bed in boost position    Transfers                   General transfer comment: unable to attempt d/t pain    Ambulation/Gait                   Stairs             Wheelchair Mobility    Modified Rankin (Stroke Patients Only)       Balance Overall balance assessment: Needs assistance;History of Falls Sitting-balance support: No upper extremity supported;Feet supported Sitting balance-Leahy Scale: Zero Sitting balance - Comments: unable d/t pain       Standing balance comment: unable to attempt d/t pain                            Cognition Arousal/Alertness: Awake/alert Behavior During Therapy: Restless;Impulsive Overall Cognitive Status: Impaired/Different from baseline Area of Impairment: Attention;Following commands;Safety/judgement;Problem solving                   Current Attention Level: Focused   Following Commands: Follows one step commands consistently;Follows multi-step commands inconsistently Safety/Judgement: Decreased awareness of safety;Decreased awareness of deficits   Problem Solving: Requires tactile cues;Requires verbal cues General Comments: pt able to follow commands today however remains internally/heavily distracted by pain level        Exercises      General Comments  Pertinent Vitals/Pain Pain Assessment: Faces Faces Pain Scale: Hurts worst Pain Location: back, L hip, cervical region Pain Descriptors / Indicators: Sore;Discomfort;Grimacing;Moaning;Sharp;Shooting Pain Intervention(s): Limited activity within patient's tolerance;Monitored during session;Premedicated before session;Repositioned    Home Living                          Prior  Function            PT Goals (current goals can now be found in the care plan section) Acute Rehab PT Goals Patient Stated Goal: return home, move to bedroom downstairs PT Goal Formulation: With patient Time For Goal Achievement: 11/19/21 Potential to Achieve Goals: Good Progress towards PT goals: Not progressing toward goals - comment (d/t pain)    Frequency    Min 2X/week      PT Plan Current plan remains appropriate    Co-evaluation              AM-PAC PT "6 Clicks" Mobility   Outcome Measure  Help needed turning from your back to your side while in a flat bed without using bedrails?: Total Help needed moving from lying on your back to sitting on the side of a flat bed without using bedrails?: Total Help needed moving to and from a bed to a chair (including a wheelchair)?: Total Help needed standing up from a chair using your arms (e.g., wheelchair or bedside chair)?: Total Help needed to walk in hospital room?: Total   6 Click Score: 5    End of Session   Activity Tolerance: Patient limited by pain Patient left: in bed;with call bell/phone within reach;with bed alarm set Nurse Communication: Mobility status PT Visit Diagnosis: Unsteadiness on feet (R26.81);Other abnormalities of gait and mobility (R26.89);Pain     Time: 5102-5852 PT Time Calculation (min) (ACUTE ONLY): 14 min  Charges:  $Therapeutic Activity: 8-22 mins                     Baxter Flattery, PT  Acute Rehab Dept [facility]) [phone] Pager [phone]  11/14/2021    [facility] 11/14/2021, 11:55 AM

## 2021-11-15 ENCOUNTER — Inpatient Hospital Stay (HOSPITAL_COMMUNITY): Payer: Medicare Other

## 2021-11-15 DIAGNOSIS — A4102 Sepsis due to Methicillin resistant Staphylococcus aureus: Secondary | ICD-10-CM | POA: Diagnosis not present

## 2021-11-15 DIAGNOSIS — G062 Extradural and subdural abscess, unspecified: Secondary | ICD-10-CM

## 2021-11-15 DIAGNOSIS — J9601 Acute respiratory failure with hypoxia: Secondary | ICD-10-CM | POA: Diagnosis not present

## 2021-11-15 DIAGNOSIS — A419 Sepsis, unspecified organism: Secondary | ICD-10-CM | POA: Diagnosis not present

## 2021-11-15 DIAGNOSIS — E785 Hyperlipidemia, unspecified: Secondary | ICD-10-CM | POA: Diagnosis not present

## 2021-11-15 DIAGNOSIS — J45909 Unspecified asthma, uncomplicated: Secondary | ICD-10-CM | POA: Diagnosis not present

## 2021-11-15 DIAGNOSIS — M4642 Discitis, unspecified, cervical region: Secondary | ICD-10-CM

## 2021-11-15 DIAGNOSIS — M4647 Discitis, unspecified, lumbosacral region: Secondary | ICD-10-CM

## 2021-11-15 DIAGNOSIS — R7881 Bacteremia: Secondary | ICD-10-CM | POA: Diagnosis not present

## 2021-11-15 LAB — CBC
HCT: 26.2 % — ABNORMAL LOW (ref 39.0–52.0)
Hemoglobin: 8.9 g/dL — ABNORMAL LOW (ref 13.0–17.0)
MCH: 31.6 pg (ref 26.0–34.0)
MCHC: 34 g/dL (ref 30.0–36.0)
MCV: 92.9 fL (ref 80.0–100.0)
Platelets: 294 10*3/uL (ref 150–400)
RBC: 2.82 MIL/uL — ABNORMAL LOW (ref 4.22–5.81)
RDW: 14.9 % (ref 11.5–15.5)
WBC: 5 10*3/uL (ref 4.0–10.5)
nRBC: 0 % (ref 0.0–0.2)

## 2021-11-15 LAB — BASIC METABOLIC PANEL
Anion gap: 8 (ref 5–15)
BUN: 15 mg/dL (ref 8–23)
CO2: 27 mmol/L (ref 22–32)
Calcium: 9 mg/dL (ref 8.9–10.3)
Chloride: 95 mmol/L — ABNORMAL LOW (ref 98–111)
Creatinine, Ser: 0.68 mg/dL (ref 0.61–1.24)
GFR, Estimated: >60 mL/min (ref >60)
Glucose, Bld: 114 mg/dL — ABNORMAL HIGH (ref 70–99)
Potassium: 4 mmol/L (ref 3.5–5.1)
Sodium: 130 mmol/L — ABNORMAL LOW (ref 135–145)

## 2021-11-15 LAB — MAGNESIUM: Magnesium: 1.8 mg/dL (ref 1.7–2.4)

## 2021-11-15 MED ORDER — HALOPERIDOL LACTATE 5 MG/ML IJ SOLN
2.0000 mg | Freq: Once | INTRAMUSCULAR | Status: AC
  Administered 2021-11-15: 2 mg via INTRAVENOUS
  Filled 2021-11-15: qty 1

## 2021-11-15 MED ORDER — ALBUTEROL SULFATE (2.5 MG/3ML) 0.083% IN NEBU
2.5000 mg | INHALATION_SOLUTION | Freq: Two times a day (BID) | RESPIRATORY_TRACT | Status: DC
Start: 2021-11-15 — End: 2021-11-17
  Administered 2021-11-15 – 2021-11-17 (×4): 2.5 mg via RESPIRATORY_TRACT
  Filled 2021-11-15 (×4): qty 3

## 2021-11-15 NOTE — Progress Notes (Signed)
Pt refused to go with radiology tech to MRI. MD aware. Pre meds given.

## 2021-11-15 NOTE — Progress Notes (Signed)
Occupational Therapy Treatment Patient Details Name: Charles Weiss. MRN: [id number] DOB: May 19, 1951 Today's Date: 11/15/2021   History of present illness 70 yo male presents to [facility] on 11/13 with neck pain, headaches x3 days, recent diagnosis of flu with admission 11/4-8. CTA head/neck shows retropharyngeal edema without visible pharyngeal or spinal source, MRI shows spine diffuse lumbar epidural enhancement without abscess; L4-5 OM changes; C5-6 prevertebral enhancement no osseous finding. workup for sepsis and mrsa along with spinal osseous involvement/discitis. PMH includes HTN, HLD, asthma, GERD.   OT comments  Upon arrival, pt awake and supine (flat) in bed. Pt performing rolling in bed with Min A. In transition from sidelying to sitting, pt pushing back impulsively into supine due to pain at L hip. Attempting upright posture twice but pain too significant. Once in supine, pt able reposition himself with bedrails. Pt also presenting with decreased cognition with decreased awareness, problem solving, and attention. Continue to recommend dc to SNF and will continue to follow acutely as admitted.   Recommendations for follow up therapy are one component of a multi-disciplinary discharge planning process, led by the attending physician.  Recommendations may be updated based on patient status, additional functional criteria and insurance authorization.    Follow Up Recommendations  Skilled nursing-short term rehab (<3 hours/day)    Assistance Recommended at Discharge Frequent or constant Supervision/Assistance  Equipment Recommendations  Other (comment) (TBD when patient is more appropirate)    Recommendations for Other Services      Precautions / Restrictions Precautions Precautions: Fall Precaution Comments: monitor O2       Mobility Bed Mobility Overal bed mobility: Needs Assistance Bed Mobility: Rolling;Sidelying to Sit;Sit to Supine Rolling: Min assist Sidelying to  sit: Max assist   Sit to supine: Min assist   General bed mobility comments: Pt rollign to sidelying with Min A for assisting hips. Max A to elevate trunk and pt then pushing back into supine due to pain. Min A for assisting legs back into bed. Attempting twice but pt then stating "not today, thats enough for today"    Transfers                         Balance Overall balance assessment: Needs assistance;History of Falls Sitting-balance support: No upper extremity supported;Feet supported Sitting balance-Leahy Scale: Zero Sitting balance - Comments: unable d/t pain                                   ADL either performed or assessed with clinical judgement   ADL Overall ADL's : Needs assistance/impaired                                       General ADL Comments: Attempting to sit at EOB, pt however, very distracted by and self limiting due to pain. Feel pt is not at baseline cognition. Pt able to roll to R and then attempting to sit upright. Once almost upright, pt pushing himself quickly and unsafely back into supine. Once safely in supine, pt abel to reposition himself and pull (using HOB bedrail) towards HOB.    Extremity/Trunk Assessment Upper Extremity Assessment Upper Extremity Assessment: Overall WFL for tasks assessed   Lower Extremity Assessment Lower Extremity Assessment: Defer to PT evaluation        Vision  Perception     Praxis      Cognition Arousal/Alertness: Awake/alert Behavior During Therapy: Restless;Flat affect Overall Cognitive Status: Impaired/Different from baseline Area of Impairment: Attention;Following commands;Safety/judgement;Problem solving;Awareness                   Current Attention Level: Sustained   Following Commands: Follows one step commands consistently;Follows multi-step commands inconsistently Safety/Judgement: Decreased awareness of safety;Decreased awareness of  deficits Awareness: Intellectual Problem Solving: Requires tactile cues;Requires verbal cues;Slow processing General Comments: Pt following simpel cues. Internally distracted by pain. Poor awareness and unsafely/impulsively throwing himself back in to supine with pain. Pt also reporting he is "trying to prepare himself for AA". Pt repeating this several time.s When questioned about it, pt unabel to explain his reasoning. Asked if he meant physical rehab and he said "yes i am going there. and I think AA". Pt also reporting that he keeps seeing small black fliesi n his room but therapist not noticing any flies.          Exercises     Shoulder Instructions       General Comments      Pertinent Vitals/ Pain       Pain Assessment: Faces Faces Pain Scale: Hurts whole lot Pain Location: back, L hip, cervical region Pain Descriptors / Indicators: Sore;Discomfort;Grimacing;Moaning;Sharp;Shooting Pain Intervention(s): Monitored during session;Limited activity within patient's tolerance;Repositioned  Home Living                                          Prior Functioning/Environment              Frequency  Min 2X/week        Progress Toward Goals  OT Goals(current goals can now be found in the care plan section)  Progress towards OT goals: Not progressing toward goals - comment  Acute Rehab OT Goals OT Goal Formulation: Patient unable to participate in goal setting Time For Goal Achievement: 11/19/21 Potential to Achieve Goals: Fair ADL Goals Pt Will Perform Lower Body Dressing: with modified independence;sit to/from stand Pt Will Transfer to Toilet: with modified independence;ambulating Pt Will Perform Toileting - Clothing Manipulation and hygiene: with modified independence;sit to/from stand Additional ADL Goal #1: Patient will perform 10 min functional activity or exercise activity as evidence of improving activity tolerance  Plan Discharge plan remains  appropriate    Co-evaluation                 AM-PAC OT "6 Clicks" Daily Activity     Outcome Measure   Help from another person eating meals?: A Little Help from another person taking care of personal grooming?: A Little Help from another person toileting, which includes using toliet, bedpan, or urinal?: A Lot Help from another person bathing (including washing, rinsing, drying)?: A Lot Help from another person to put on and taking off regular upper body clothing?: A Lot Help from another person to put on and taking off regular lower body clothing?: A Lot 6 Click Score: 14    End of Session    OT Visit Diagnosis: Unsteadiness on feet (R26.81);Other abnormalities of gait and mobility (R26.89);Muscle weakness (generalized) (M62.81);History of falling (Z91.81)   Activity Tolerance Patient limited by pain   Patient Left in bed;with call bell/phone within reach   Nurse Communication Mobility status (benefit from SLP cognition consult)        Time:  1121-6244 OT Time Calculation (min): 18 min  Charges: OT General Charges $OT Visit: 1 Visit OT Treatments $Therapeutic Activity: 8-22 mins  [place], OTR/L Acute Rehab Pager: [phone] Office: [place] 11/15/2021, 1:09 PM

## 2021-11-15 NOTE — Progress Notes (Signed)
PROGRESS NOTE    Charles Weiss.  TLX:[id number] DOB: 07-28-1951 DOA: 11/02/2021 PCP: System, Provider Not In   Brief Narrative:  70 year old with history of asthma, HTN comes to the hospital complains of neck pain and headache.  He was found to have MRSA bacteremia therefore infectious disease was consulted.  He was started on vancomycin.  Work-up revealed cervical epidural.  Repeat CT and MRI showed progression of osteomyelitis/discitis and epidural abscess in the lumbar and cervical region.  No cord compression was noted, neurosurgery was consulted.  He was recommended to continue medical management at this time.   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  ID following. - 2D echo is overall unremarkable, Still need TEE.  - ID is following.  There is definite progression of epidural abscess in the cervical region and discitis/osteomyelitis in the lumbar/sacral region.  Neurosurgery recommends medical management at this time. -pain control is his biggest issue. Due to significant amount of back pain we will  oxycodone IR to 10 mg, will increase as needed.  Will order MRI thoracic spine with/without contrast today.   Retropharyngeal edema - Seen by ENT.  IV antibiotics. - CT neck with contrast-showed worsening of cervical spine abscess.    Abdominal Pain -Resolved   Acute Encephalopathy with Agitation, improved -This is fluctuating.  He is on bedtime 25 mg of Seroquel.   Hypertension -Patient is on losartan 50 mg daily.  IV as needed's as necessary.   Hyperlipidemia -Resume  statin when able     Asthma -As needed bronchodilators   Hyperbilirubinemia -Resolved   Normocytic Anemia --Anemia of chronic disease.  Continue to closely monitor.   Obesity -Weight loss and outpatient dietary counseling     DVT prophylaxis: Lovenox Code Status: DNR Family Communication:  Sister updated periodically  Status is: Inpatient  Remains inpatient appropriate because: Ongoing evaluation for cervical epidural abscess    Subjective: Still having back discomfort and pain. Remains afebrile  Review of Systems Otherwise negative except as per HPI, including: General = no fevers, chills, dizziness,  fatigue HEENT/EYES = negative for loss of vision, double vision, blurred vision,  sore throa Cardiovascular= negative for chest pain, palpitation Respiratory/lungs= negative for shortness of breath, cough, wheezing; hemoptysis,  Gastrointestinal= negative for nausea, vomiting, abdominal pain Genitourinary= negative for Dysuria MSK = Negative for myalgias Neurology= Negative for headache, numbness, tingling  Psychiatry= Negative for suicidal and homocidal ideation Skin= Negative for Rash   Examination: Constitutional: Mild distress due to his back pain.  Respiratory: Clear to auscultation bilaterally Cardiovascular: Normal sinus rhythm, no rubs Abdomen: Nontender nondistended good bowel sounds Musculoskeletal: No edema noted Skin: No rashes seen Neurologic: CN 2-12 grossly intact.  And nonfocal Psychiatric: Normal judgment and insight. Alert and oriented x 3. Normal mood.   Objective: Vitals:   11/14/21 2009 11/14/21 2145 11/15/21 0532 11/15/21 0752  BP:  119/79 124/76   Pulse: 84 90 85   Resp: 18 18 18    Temp:  98.2 F (36.8 C) 98.1 F (36.7 C)   TempSrc:  Oral Oral   SpO2: 96% 93% 95% 97%  Weight:  Height:        Intake/Output Summary (Last 24 hours) at 11/15/2021 0917 Last data filed at 11/14/2021 1900 Gross per 24 hour  Intake --  Output  350 ml  Net -350 ml   Filed Weights   11/02/21 0701 11/11/21 1212  Weight: 113.4 kg 112.4 kg     Data Reviewed:   CBC: Recent Labs  Lab 11/09/21 0623 11/10/21 0431 11/11/21 0434 11/12/21 0332 11/13/21 0325 11/14/21 0429 11/15/21 0342  WBC 11.2* 9.5 8.7 7.6 8.2 8.1 5.0  NEUTROABS 9.5* 7.7 6.9 6.0  --   --   --   HGB 9.6* 9.3* 8.5* 8.8* 8.9* 9.0* 8.9*  HCT 27.8* 27.1* 25.4* 26.1* 26.0* 26.5* 26.2*  MCV 86.6 89.7 91.0 91.6 90.3 90.8 92.9  PLT 218 209 217 257 275 307 353   Basic Metabolic Panel: Recent Labs  Lab 11/09/21 0623 11/10/21 0431 11/11/21 0434 11/12/21 0332 11/13/21 0325 11/14/21 0429 11/15/21 0342  NA 130* 132* 133* 130* 130* 130* 130*  K 3.7 3.3* 4.3 4.0 3.9 4.6 4.0  CL 98 99 103 97* 95* 96* 95*  CO2 25 26 24 26 26 26 27   GLUCOSE 137* 137* 119* 104* 108* 124* 114*  BUN 19 19 15 12 14 22 15   CREATININE 0.54* 0.64 0.61 0.71 0.71 0.96 0.68  CALCIUM 8.6* 8.3* 8.5* 8.7* 8.7* 8.9 9.0  MG 1.8 2.1 1.9 1.8 1.8 2.1 1.8  PHOS 2.8 3.2 3.7 4.4  --   --   --    GFR: Estimated Creatinine Clearance: 111.2 mL/min (by C-G formula based on SCr of 0.68 mg/dL). Liver Function Tests: Recent Labs  Lab 11/09/21 0623 11/10/21 0431 11/11/21 0434 11/12/21 0332  AST 45* 56* 38 29  ALT 48* 58* 52* 58*  ALKPHOS 73 72 70 67  BILITOT 1.8* 1.2 0.9 0.9  PROT 6.9 6.6 6.4* 6.9  ALBUMIN 2.3* 2.1* 2.1* 2.1*   No results for input(s): LIPASE, AMYLASE in the last 168 hours. No results for input(s): AMMONIA in the last 168 hours. Coagulation Profile: No results for input(s): INR, PROTIME in the last 168 hours. Cardiac Enzymes: Recent Labs  Lab 11/09/21 1310  CKTOTAL 122   BNP (last 3 results) No results for input(s): PROBNP in the last 8760 hours. HbA1C: No results for input(s): HGBA1C in the last 72 hours. CBG: No results for input(s): GLUCAP in the last 168 hours.  Lipid Profile: No results for input(s): CHOL, HDL, LDLCALC, TRIG, CHOLHDL, LDLDIRECT in the last 72  hours. Thyroid Function Tests: No results for input(s): TSH, T4TOTAL, FREET4, T3FREE, THYROIDAB in the last 72 hours. Anemia Panel: No results for input(s): VITAMINB12, FOLATE, FERRITIN, TIBC, IRON, RETICCTPCT in the last 72 hours. Sepsis Labs: No results for input(s): PROCALCITON, LATICACIDVEN in the last 168 hours.  Recent Results (from the past 240 hour(s))  Culture, blood (routine x 2)     Status: None   Collection Time: 11/06/21  5:48 AM   Specimen: BLOOD RIGHT HAND  Result Value Ref Range Status   Specimen Description   Final    BLOOD RIGHT HAND Performed at [facility] [address] [place], [place] [address]    Special Requests   Final    BOTTLES DRAWN AEROBIC ONLY Blood Culture adequate volume Performed at [facility] [address], [place], [place] [address]    Culture   Final    NO GROWTH 5 DAYS Performed at [facility], [place]  [address], [place], [place] [address]    Report Status 11/11/2021 FINAL  Final  Culture, blood (routine x 2)     Status: Abnormal   Collection Time: 11/06/21  5:52 AM   Specimen: BLOOD LEFT HAND  Result Value Ref Range Status   Specimen Description   Final    BLOOD LEFT HAND Performed at [facility] [address], [place], [place] [address]    Special Requests   Final    BOTTLES DRAWN AEROBIC AND ANAEROBIC Blood Culture adequate volume Performed at [facility] [address], [place], [place] [address]    Culture  Setup Time   Final    GRAM POSITIVE COCCI ANAEROBIC BOTTLE ONLY CRITICAL VALUE NOTED.  VALUE IS CONSISTENT WITH PREVIOUSLY REPORTED AND CALLED VALUE.    Culture (A)  Final    STAPHYLOCOCCUS AUREUS SUSCEPTIBILITIES PERFORMED ON PREVIOUS CULTURE WITHIN THE LAST 5 DAYS. Performed at [facility], [place] [address], [place], [place] [address]    Report Status 11/08/2021 FINAL  Final  Culture, blood (routine x 2)     Status: Abnormal    Collection Time: 11/07/21  9:28 AM   Specimen: BLOOD  Result Value Ref Range Status   Specimen Description   Final    BLOOD LEFT WRIST Performed at [facility] [address] [place], [place] [address]    Special Requests   Final    BOTTLES DRAWN AEROBIC AND ANAEROBIC Blood Culture adequate volume Performed at [facility] [address], [place], [place] [address]    Culture  Setup Time   Final    GRAM POSITIVE COCCI ANAEROBIC BOTTLE ONLY CRITICAL VALUE NOTED.  VALUE IS CONSISTENT WITH PREVIOUSLY REPORTED AND CALLED VALUE. Performed at [facility], [place] [address], [place], [place] [address]    Culture METHICILLIN RESISTANT STAPHYLOCOCCUS AUREUS (A)  Final   Report Status 11/11/2021 FINAL  Final  Culture, blood (routine x 2)     Status: Abnormal   Collection Time: 11/07/21  9:29 AM   Specimen: BLOOD RIGHT HAND  Result Value Ref Range Status   Specimen Description   Final    BLOOD RIGHT HAND Performed at [facility] [address], [place], [place] [address]    Special Requests   Final    BOTTLES DRAWN AEROBIC ONLY Blood Culture adequate volume Performed at [facility] [address] [place], [place] [address]    Culture  Setup Time   Final    GRAM POSITIVE COCCI AEROBIC BOTTLE ONLY CRITICAL VALUE NOTED.  VALUE IS CONSISTENT WITH PREVIOUSLY REPORTED AND CALLED VALUE.    Culture (A)  Final    STAPHYLOCOCCUS AUREUS SUSCEPTIBILITIES PERFORMED ON PREVIOUS CULTURE WITHIN THE LAST 5 DAYS. Performed at [facility], [place] [address] [place], [place] [address]    Report Status 11/09/2021 FINAL  Final  Culture, blood (routine x 2)     Status: None   Collection Time: 11/09/21 12:49 PM   Specimen: BLOOD  Result Value Ref Range Status   Specimen Description   Final    BLOOD RIGHT ANTECUBITAL Performed at [facility] [address], [place], [place] [address]     Special Requests   Final    BOTTLES DRAWN AEROBIC ONLY Blood Culture results may not be optimal due to an inadequate volume of blood received in culture bottles Performed at [facility] [address] [place],  [place] [address]    Culture   Final    NO GROWTH 5 DAYS Performed at [facility], [place] [address], [place], [place] [address]    Report Status 11/14/2021 FINAL  Final  Culture, blood (routine x 2)     Status: None   Collection Time: 11/09/21 12:49 PM   Specimen: BLOOD  Result Value Ref Range Status   Specimen Description   Final    BLOOD BLOOD LEFT HAND Performed at [facility] [address], [place], [place] [address]    Special Requests   Final    BOTTLES DRAWN AEROBIC ONLY Blood Culture adequate volume Performed at [facility] [address], [place], [place] [address]    Culture   Final    NO GROWTH 5 DAYS Performed at [facility], [place] [address] [place],  [address]    Report Status 11/14/2021 FINAL  Final         Radiology Studies: No results found.      Scheduled Meds:  albuterol  2.5 mg Nebulization BID   Chlorhexidine Gluconate Cloth  6 each Topical Daily   enoxaparin (LOVENOX) injection  60 mg Subcutaneous Q24H   gabapentin  300 mg Oral TID   losartan  50 mg Oral Daily   mometasone-formoterol  2 puff Inhalation BID   pantoprazole  40 mg Oral Daily   QUEtiapine  25 mg Oral QHS   sodium chloride flush  10-40 mL Intracatheter Q12H   sodium chloride flush  3 mL Intravenous Q12H   Continuous Infusions:  sodium chloride 10 mL/hr at 11/03/21 1032   ceFTAROline (TEFLARO) IV 600 mg (11/15/21 0711)   DAPTOmycin (CUBICIN)  IV 750 mg (11/14/21 2023)     LOS: 13 days   Time spent= 35 mins    Tawna Alwin Arsenio Loader, MD [facility]  If 7PM-7AM, please contact night-coverage  11/15/2021, 9:17 AM

## 2021-11-15 NOTE — Plan of Care (Signed)
  Problem: Education: Goal: Knowledge of General Education information will improve Description: Including pain rating scale, medication(s)/side effects and non-pharmacologic comfort measures Outcome: Not Progressing   Does not understand why he is in the hospital or what is wrong with him, despite multiple attempts to explain

## 2021-11-15 NOTE — Progress Notes (Signed)
Subjective: Patient with continued pain in his neck and back.  He is not eager to go through another MRI   Antibiotics:  Anti-infectives (From admission, onward)    Start     Dose/Rate Route Frequency Ordered Stop   11/09/21 1515  ceftaroline (TEFLARO) 600 mg in sodium chloride 0.9 % 100 mL IVPB        600 mg 100 mL/hr over 60 Minutes Intravenous Every 8 hours 11/09/21 1418     11/09/21 1230  DAPTOmycin (CUBICIN) 750 mg in sodium chloride 0.9 % IVPB        8 mg/kg  91.9 kg (Adjusted) 130 mL/hr over 30 Minutes Intravenous Daily 11/09/21 1139     11/06/21 2200  vancomycin (VANCOREADY) IVPB 1500 mg/300 mL  Status:  Discontinued        1,500 mg 150 mL/hr over 120 Minutes Intravenous Every 12 hours 11/06/21 1223 11/09/21 1128   11/03/21 1400  ceFEPIme (MAXIPIME) 2 g in sodium chloride 0.9 % 100 mL IVPB  Status:  Discontinued        2 g 200 mL/hr over 30 Minutes Intravenous Every 8 hours 11/03/21 1048 11/03/21 1119   11/03/21 1200  ampicillin (OMNIPEN) 1 g in sodium chloride 0.9 % 100 mL IVPB  Status:  Discontinued        1 g 300 mL/hr over 20 Minutes Intravenous Every 6 hours 11/03/21 1041 11/03/21 1119   11/03/21 0600  metroNIDAZOLE (FLAGYL) IVPB 500 mg  Status:  Discontinued        500 mg 100 mL/hr over 60 Minutes Intravenous Every 12 hours 11/02/21 2031 11/02/21 2248   11/03/21 0200  ceFEPIme (MAXIPIME) 2 g in sodium chloride 0.9 % 100 mL IVPB  Status:  Discontinued        2 g 200 mL/hr over 30 Minutes Intravenous Every 8 hours 11/02/21 2101 11/03/21 0445   11/03/21 0100  acyclovir (ZOVIRAX) 775 mg in dextrose 5 % 100 mL IVPB  Status:  Discontinued        10 mg/kg  77.6 kg (Ideal) 115.5 mL/hr over 60 Minutes Intravenous Every 8 hours 11/02/21 2308 11/03/21 0445   11/03/21 0000  ampicillin (OMNIPEN) 2 g in sodium chloride 0.9 % 100 mL IVPB  Status:  Discontinued        2 g 300 mL/hr over 20 Minutes Intravenous Every 4 hours 11/02/21 2308 11/03/21 0445   11/02/21 2200   vancomycin (VANCOREADY) IVPB 1000 mg/200 mL  Status:  Discontinued        1,000 mg 200 mL/hr over 60 Minutes Intravenous Every 12 hours 11/02/21 2101 11/06/21 1223   11/02/21 1700  ceFEPIme (MAXIPIME) 2 g in sodium chloride 0.9 % 100 mL IVPB        2 g 200 mL/hr over 30 Minutes Intravenous  Once 11/02/21 1646 11/02/21 1805   11/02/21 1700  metroNIDAZOLE (FLAGYL) IVPB 500 mg        500 mg 100 mL/hr over 60 Minutes Intravenous  Once 11/02/21 1646 11/02/21 2015   11/02/21 1700  vancomycin (VANCOCIN) IVPB 1000 mg/200 mL premix        1,000 mg 200 mL/hr over 60 Minutes Intravenous  Once 11/02/21 1646 11/02/21 2130       Medications: Scheduled Meds:  albuterol  2.5 mg Nebulization BID   Chlorhexidine Gluconate Cloth  6 each Topical Daily   enoxaparin (LOVENOX) injection  60 mg Subcutaneous Q24H   gabapentin  300 mg Oral TID  losartan  50 mg Oral Daily   mometasone-formoterol  2 puff Inhalation BID   pantoprazole  40 mg Oral Daily   QUEtiapine  25 mg Oral QHS   sodium chloride flush  10-40 mL Intracatheter Q12H   sodium chloride flush  3 mL Intravenous Q12H   Continuous Infusions:  sodium chloride 10 mL/hr at 11/03/21 1032   ceFTAROline (TEFLARO) IV 600 mg (11/15/21 1605)   DAPTOmycin (CUBICIN)  IV 750 mg (11/14/21 2023)   PRN Meds:.sodium chloride, acetaminophen **OR** acetaminophen, guaiFENesin, haloperidol lactate, hydrALAZINE, ipratropium-albuterol, lip balm, metoprolol tartrate, morphine injection, naLOXone (NARCAN)  injection, ondansetron **OR** ondansetron (ZOFRAN) IV, oxyCODONE, senna-docusate, sodium chloride flush, traZODone    Objective: Weight change:   Intake/Output Summary (Last 24 hours) at 11/15/2021 1619 Last data filed at 11/14/2021 1900 Gross per 24 hour  Intake --  Output 350 ml  Net -350 ml   Blood pressure 129/77, pulse 86, temperature 98.3 F (36.8 C), temperature source Oral, resp. rate 19, height 6' (1.829 m), weight 112.4 kg, SpO2 95 %. Temp:   [98.1 F (36.7 C)-98.3 F (36.8 C)] 98.3 F (36.8 C) (11/26 1231) Pulse Rate:  [84-90] 86 (11/26 1231) Resp:  [18-19] 19 (11/26 1231) BP: (119-129)/(76-79) 129/77 (11/26 1231) SpO2:  [93 %-97 %] 95 % (11/26 1231)  Physical Exam: Physical Exam Constitutional:      Appearance: He is well-developed. He is obese.  HENT:     Head: Normocephalic and atraumatic.  Eyes:     Conjunctiva/sclera: Conjunctivae normal.  Cardiovascular:     Rate and Rhythm: Normal rate and regular rhythm.     Heart sounds: No murmur heard.   No gallop.  Pulmonary:     Effort: Pulmonary effort is normal. No respiratory distress.     Breath sounds: Normal breath sounds. No stridor. No wheezing.  Abdominal:     General: There is no distension.     Palpations: Abdomen is soft.  Musculoskeletal:        General: Normal range of motion.     Cervical back: Normal range of motion and neck supple.  Skin:    General: Skin is warm and dry.     Findings: No erythema or rash.  Neurological:     General: No focal deficit present.     Mental Status: He is alert and oriented to person, place, and time.  Psychiatric:        Mood and Affect: Mood normal.        Behavior: Behavior normal.        Thought Content: Thought content normal.        Judgment: Judgment normal.    He still appears quite uncomfortable and significant pain CBC:    BMET Recent Labs    11/14/21 0429 11/15/21 0342  NA 130* 130*  K 4.6 4.0  CL 96* 95*  CO2 26 27  GLUCOSE 124* 114*  BUN 22 15  CREATININE 0.96 0.68  CALCIUM 8.9 9.0     Liver Panel  No results for input(s): PROT, ALBUMIN, AST, ALT, ALKPHOS, BILITOT, BILIDIR, IBILI in the last 72 hours.     Sedimentation Rate No results for input(s): ESRSEDRATE in the last 72 hours. C-Reactive Protein No results for input(s): CRP in the last 72 hours.  Micro Results: Recent Results (from the past 720 hour(s))  Resp Panel by RT-PCR (Flu A&B, Covid) Nasopharyngeal Swab      Status: Abnormal   Collection Time: 10/24/21 12:52 AM   Specimen: Nasopharyngeal  Swab; Nasopharyngeal(NP) swabs in vial transport medium  Result Value Ref Range Status   SARS Coronavirus 2 by RT PCR NEGATIVE NEGATIVE Final    Comment: (NOTE) SARS-CoV-2 target nucleic acids are NOT DETECTED.  The SARS-CoV-2 RNA is generally detectable in upper respiratory specimens during the acute phase of infection. The lowest concentration of SARS-CoV-2 viral copies this assay can detect is 138 copies/mL. A negative result does not preclude SARS-Cov-2 infection and should not be used as the sole basis for treatment or other patient management decisions. A negative result may occur with  improper specimen collection/handling, submission of specimen other than nasopharyngeal swab, presence of viral mutation(s) within the areas targeted by this assay, and inadequate number of viral copies(<138 copies/mL). A negative result must be combined with clinical observations, patient history, and epidemiological information. The expected result is Negative.  Fact Sheet for Patients:  EntrepreneurPulse.com.au  Fact Sheet for Healthcare Providers:  IncredibleEmployment.be  This test is no t yet approved or cleared by the [place] FDA and  has been authorized for detection and/or diagnosis of SARS-CoV-2 by FDA under an Emergency Use Authorization (EUA). This EUA will remain  in effect (meaning this test can be used) for the duration of the COVID-19 declaration under Section 564(b)(1) of the Act, 21 U.S.C.section 360bbb-3(b)(1), unless the authorization is terminated  or revoked sooner.       Influenza A by PCR POSITIVE (A) NEGATIVE Final   Influenza B by PCR NEGATIVE NEGATIVE Final    Comment: (NOTE) The Xpert Xpress SARS-CoV-2/FLU/RSV plus assay is intended as an aid in the diagnosis of influenza from Nasopharyngeal swab specimens and should not be used as a sole  basis for treatment. Nasal washings and aspirates are unacceptable for Xpert Xpress SARS-CoV-2/FLU/RSV testing.  Fact Sheet for Patients: EntrepreneurPulse.com.au  Fact Sheet for Healthcare Providers: IncredibleEmployment.be  This test is not yet approved or cleared by the [place] FDA and has been authorized for detection and/or diagnosis of SARS-CoV-2 by FDA under an Emergency Use Authorization (EUA). This EUA will remain in effect (meaning this test can be used) for the duration of the COVID-19 declaration under Section 564(b)(1) of the Act, 21 U.S.C. section 360bbb-3(b)(1), unless the authorization is terminated or revoked.  Performed at [facility], [place] [address] [place], [place] [address]   Blood culture (routine x 2)     Status: Abnormal   Collection Time: 11/02/21 12:12 PM   Specimen: BLOOD  Result Value Ref Range Status   Specimen Description   Final    BLOOD RIGHT ANTECUBITAL Performed at [facility] [address], [place], [place] [address]    Special Requests   Final    BOTTLES DRAWN AEROBIC AND ANAEROBIC Blood Culture results may not be optimal due to an excessive volume of blood received in culture bottles Performed at [facility] [address], [place], [place] [address]    Culture  Setup Time   Final    GRAM POSITIVE COCCI IN CLUSTERS IN BOTH AEROBIC AND ANAEROBIC BOTTLES CRITICAL RESULT CALLED TO, READ BACK BY AND VERIFIED WITH: M LILLISTON,PHARMD@0432  11/03/21 [place]    Culture (A)  Final    METHICILLIN RESISTANT STAPHYLOCOCCUS AUREUS SEE SEPARATE REPORT Performed at [facility], [address] [place], [place] [address]    Report Status 11/09/2021 FINAL  Final   Organism ID, Bacteria METHICILLIN RESISTANT STAPHYLOCOCCUS AUREUS  Final      Susceptibility   Methicillin resistant staphylococcus  aureus - MIC*    CIPROFLOXACIN >=8 RESISTANT  Resistant     ERYTHROMYCIN >=8 RESISTANT Resistant     GENTAMICIN <=0.5 SENSITIVE Sensitive     OXACILLIN >=4 RESISTANT Resistant     TETRACYCLINE <=1 SENSITIVE Sensitive     VANCOMYCIN 1 SENSITIVE Sensitive     TRIMETH/SULFA <=10 SENSITIVE Sensitive     CLINDAMYCIN <=0.25 SENSITIVE Sensitive     RIFAMPIN <=0.5 SENSITIVE Sensitive     Inducible Clindamycin NEGATIVE Sensitive     * METHICILLIN RESISTANT STAPHYLOCOCCUS AUREUS  Blood Culture ID Panel (Reflexed)     Status: Abnormal   Collection Time: 11/02/21 12:12 PM  Result Value Ref Range Status   Enterococcus faecalis NOT DETECTED NOT DETECTED Final   Enterococcus Faecium NOT DETECTED NOT DETECTED Final   Listeria monocytogenes NOT DETECTED NOT DETECTED Final   Staphylococcus species DETECTED (A) NOT DETECTED Final    Comment: CRITICAL RESULT CALLED TO, READ BACK BY AND VERIFIED WITH: M LILLISTON,PHARMD@0431  11/03/21 [place]    Staphylococcus aureus (BCID) DETECTED (A) NOT DETECTED Final    Comment: Methicillin (oxacillin)-resistant Staphylococcus aureus (MRSA). MRSA is predictably resistant to beta-lactam antibiotics (except ceftaroline). Preferred therapy is vancomycin unless clinically contraindicated. Patient requires contact precautions if  hospitalized. CRITICAL RESULT CALLED TO, READ BACK BY AND VERIFIED WITH: M LILLISTON,PHARMD@0431  11/03/21 [place]    Staphylococcus epidermidis NOT DETECTED NOT DETECTED Final   Staphylococcus lugdunensis NOT DETECTED NOT DETECTED Final   Streptococcus species NOT DETECTED NOT DETECTED Final   Streptococcus agalactiae NOT DETECTED NOT DETECTED Final   Streptococcus pneumoniae NOT DETECTED NOT DETECTED Final   Streptococcus pyogenes NOT DETECTED NOT DETECTED Final   A.calcoaceticus-baumannii NOT DETECTED NOT DETECTED Final   Bacteroides fragilis NOT DETECTED NOT DETECTED Final   Enterobacterales NOT DETECTED NOT DETECTED Final   Enterobacter cloacae complex NOT DETECTED NOT DETECTED Final    Escherichia coli NOT DETECTED NOT DETECTED Final   Klebsiella aerogenes NOT DETECTED NOT DETECTED Final   Klebsiella oxytoca NOT DETECTED NOT DETECTED Final   Klebsiella pneumoniae NOT DETECTED NOT DETECTED Final   Proteus species NOT DETECTED NOT DETECTED Final   Salmonella species NOT DETECTED NOT DETECTED Final   Serratia marcescens NOT DETECTED NOT DETECTED Final   Haemophilus influenzae NOT DETECTED NOT DETECTED Final   Neisseria meningitidis NOT DETECTED NOT DETECTED Final   Pseudomonas aeruginosa NOT DETECTED NOT DETECTED Final   Stenotrophomonas maltophilia NOT DETECTED NOT DETECTED Final   Candida albicans NOT DETECTED NOT DETECTED Final   Candida auris NOT DETECTED NOT DETECTED Final   Candida glabrata NOT DETECTED NOT DETECTED Final   Candida krusei NOT DETECTED NOT DETECTED Final   Candida parapsilosis NOT DETECTED NOT DETECTED Final   Candida tropicalis NOT DETECTED NOT DETECTED Final   Cryptococcus neoformans/gattii NOT DETECTED NOT DETECTED Final   Meth resistant mecA/C and MREJ DETECTED (A) NOT DETECTED Final    Comment: CRITICAL RESULT CALLED TO, READ BACK BY AND VERIFIED WITH: M LILLISTON,PHARMD@0431  11/03/21 [place] Performed at [facility], [address], [place], [place] [address]   Blood culture (routine x 2)     Status: Abnormal   Collection Time: 11/02/21 12:17 PM   Specimen: BLOOD  Result Value Ref Range Status   Specimen Description   Final    BLOOD LEFT ANTECUBITAL Performed at [facility] [address] [place], [place] [address]    Special Requests   Final    BOTTLES DRAWN AEROBIC AND ANAEROBIC Blood Culture adequate volume  Performed at [facility], [place] [address] [place], [place] [address]    Culture  Setup Time   Final    GRAM POSITIVE COCCI IN CLUSTERS IN BOTH AEROBIC AND ANAEROBIC BOTTLES CRITICAL VALUE NOTED.  VALUE IS CONSISTENT WITH PREVIOUSLY REPORTED AND CALLED VALUE.    Culture (A)  Final     STAPHYLOCOCCUS AUREUS SUSCEPTIBILITIES PERFORMED ON PREVIOUS CULTURE WITHIN THE LAST 5 DAYS. Performed at [facility], [place] [address] [place], [place] [address]    Report Status 11/05/2021 FINAL  Final  Resp Panel by RT-PCR (Flu A&B, Covid) Nasopharyngeal Swab     Status: None   Collection Time: 11/02/21  4:16 PM   Specimen: Nasopharyngeal Swab; Nasopharyngeal(NP) swabs in vial transport medium  Result Value Ref Range Status   SARS Coronavirus 2 by RT PCR NEGATIVE NEGATIVE Final    Comment: (NOTE) SARS-CoV-2 target nucleic acids are NOT DETECTED.  The SARS-CoV-2 RNA is generally detectable in upper respiratory specimens during the acute phase of infection. The lowest concentration of SARS-CoV-2 viral copies this assay can detect is 138 copies/mL. A negative result does not preclude SARS-Cov-2 infection and should not be used as the sole basis for treatment or other patient management decisions. A negative result may occur with  improper specimen collection/handling, submission of specimen other than nasopharyngeal swab, presence of viral mutation(s) within the areas targeted by this assay, and inadequate number of viral copies(<138 copies/mL). A negative result must be combined with clinical observations, patient history, and epidemiological information. The expected result is Negative.  Fact Sheet for Patients:  EntrepreneurPulse.com.au  Fact Sheet for Healthcare Providers:  IncredibleEmployment.be  This test is no t yet approved or cleared by the [place] FDA and  has been authorized for detection and/or diagnosis of SARS-CoV-2 by FDA under an Emergency Use Authorization (EUA). This EUA will remain  in effect (meaning this test can be used) for the duration of the COVID-19 declaration under Section 564(b)(1) of the Act, 21 U.S.C.section 360bbb-3(b)(1), unless the authorization is terminated  or revoked sooner.       Influenza  A by PCR NEGATIVE NEGATIVE Final   Influenza B by PCR NEGATIVE NEGATIVE Final    Comment: (NOTE) The Xpert Xpress SARS-CoV-2/FLU/RSV plus assay is intended as an aid in the diagnosis of influenza from Nasopharyngeal swab specimens and should not be used as a sole basis for treatment. Nasal washings and aspirates are unacceptable for Xpert Xpress SARS-CoV-2/FLU/RSV testing.  Fact Sheet for Patients: EntrepreneurPulse.com.au  Fact Sheet for Healthcare Providers: IncredibleEmployment.be  This test is not yet approved or cleared by the [place] FDA and has been authorized for detection and/or diagnosis of SARS-CoV-2 by FDA under an Emergency Use Authorization (EUA). This EUA will remain in effect (meaning this test can be used) for the duration of the COVID-19 declaration under Section 564(b)(1) of the Act, 21 U.S.C. section 360bbb-3(b)(1), unless the authorization is terminated or revoked.  Performed at [facility], [place] [address], [place], [place] [address]   Urine Culture     Status: Abnormal   Collection Time: 11/02/21  4:33 PM   Specimen: In/Out Cath Urine  Result Value Ref Range Status   Specimen Description   Final    IN/OUT CATH URINE Performed at [place] [address] [place], [place] [address]    Special Requests   Final    NONE Performed at [facility], [place] [address] [place], [place]  [address]    Culture (A)  Final    >=100,000 COLONIES/mL METHICILLIN RESISTANT STAPHYLOCOCCUS AUREUS   Report Status 11/05/2021 FINAL  Final   Organism ID, Bacteria METHICILLIN RESISTANT STAPHYLOCOCCUS AUREUS (A)  Final      Susceptibility   Methicillin resistant staphylococcus aureus - MIC*    CIPROFLOXACIN >=8 RESISTANT Resistant     GENTAMICIN <=0.5 SENSITIVE Sensitive     NITROFURANTOIN <=16 SENSITIVE Sensitive     OXACILLIN >=4 RESISTANT Resistant     TETRACYCLINE <=1  SENSITIVE Sensitive     VANCOMYCIN 1 SENSITIVE Sensitive     TRIMETH/SULFA <=10 SENSITIVE Sensitive     CLINDAMYCIN <=0.25 SENSITIVE Sensitive     RIFAMPIN <=0.5 SENSITIVE Sensitive     Inducible Clindamycin NEGATIVE Sensitive     * >=100,000 COLONIES/mL METHICILLIN RESISTANT STAPHYLOCOCCUS AUREUS  Culture, blood (routine x 2)     Status: None   Collection Time: 11/04/21  5:19 AM   Specimen: BLOOD RIGHT HAND  Result Value Ref Range Status   Specimen Description   Final    BLOOD RIGHT HAND Performed at [facility], [address] [place], [place] [address]    Special Requests   Final    BOTTLES DRAWN AEROBIC ONLY Blood Culture adequate volume Performed at [facility] [address], [place], [place] [address]    Culture   Final    NO GROWTH 5 DAYS Performed at [facility], [place] [address] [place], [place] [address]    Report Status 11/09/2021 FINAL  Final  Culture, blood (routine x 2)     Status: None   Collection Time: 11/04/21  5:19 AM   Specimen: BLOOD LEFT HAND  Result Value Ref Range Status   Specimen Description   Final    BLOOD LEFT HAND Performed at [facility], [place] [address] [place], [place] [address]    Special Requests   Final    BOTTLES DRAWN AEROBIC ONLY Blood Culture adequate volume Performed at [facility] [address], [place], [place] [address]    Culture   Final    NO GROWTH 5 DAYS Performed at [facility], [place] [address] [place], [place] [address]    Report Status 11/09/2021 FINAL  Final  Culture, blood (routine x 2)     Status: None   Collection Time: 11/06/21  5:48 AM   Specimen: BLOOD RIGHT HAND  Result Value Ref Range Status   Specimen Description   Final    BLOOD RIGHT HAND Performed at [facility] [address] [place], [place] [address]    Special Requests   Final    BOTTLES DRAWN AEROBIC ONLY Blood Culture adequate volume Performed at [facility] [address] [place], [place] [address]    Culture   Final    NO GROWTH 5 DAYS Performed at [facility], [place] [address], [place], [place] [address]    Report Status 11/11/2021 FINAL  Final  Culture, blood (routine x 2)     Status: Abnormal   Collection Time: 11/06/21  5:52 AM   Specimen: BLOOD LEFT HAND  Result Value Ref Range Status   Specimen Description   Final    BLOOD LEFT HAND Performed at [place] [address], [place],  [address]    Special Requests   Final    BOTTLES DRAWN AEROBIC AND ANAEROBIC Blood Culture adequate volume Performed  at [facility], [place] [address] [place], [place] [address]    Culture  Setup Time   Final    GRAM POSITIVE COCCI ANAEROBIC BOTTLE ONLY CRITICAL VALUE NOTED.  VALUE IS CONSISTENT WITH PREVIOUSLY REPORTED AND CALLED VALUE.    Culture (A)  Final    STAPHYLOCOCCUS AUREUS SUSCEPTIBILITIES PERFORMED ON PREVIOUS CULTURE WITHIN THE LAST 5 DAYS. Performed at [facility], [place] [address], [place], [place] [address]    Report Status 11/08/2021 FINAL  Final  Culture, blood (routine x 2)     Status: Abnormal   Collection Time: 11/07/21  9:28 AM   Specimen: BLOOD  Result Value Ref Range Status   Specimen Description   Final    BLOOD LEFT WRIST Performed at [facility] [address] [place], [place] [address]    Special Requests   Final    BOTTLES DRAWN AEROBIC AND ANAEROBIC Blood Culture adequate volume Performed at [facility] [address] [place], [place] [address]    Culture  Setup Time   Final    GRAM POSITIVE COCCI ANAEROBIC BOTTLE ONLY CRITICAL VALUE NOTED.  VALUE IS CONSISTENT WITH PREVIOUSLY REPORTED AND CALLED VALUE. Performed at [facility], [place] [address], [place], [place] [address]    Culture METHICILLIN RESISTANT STAPHYLOCOCCUS AUREUS (A)  Final   Report Status 11/11/2021 FINAL  Final   Culture, blood (routine x 2)     Status: Abnormal   Collection Time: 11/07/21  9:29 AM   Specimen: BLOOD RIGHT HAND  Result Value Ref Range Status   Specimen Description   Final    BLOOD RIGHT HAND Performed at [place] [facility] [address] [place], [place] [address]    Special Requests   Final    BOTTLES DRAWN AEROBIC ONLY Blood Culture adequate volume Performed at [facility] [address], [place], [place] [address]    Culture  Setup Time   Final    GRAM POSITIVE COCCI AEROBIC BOTTLE ONLY CRITICAL VALUE NOTED.  VALUE IS CONSISTENT WITH PREVIOUSLY REPORTED AND CALLED VALUE.    Culture (A)  Final    STAPHYLOCOCCUS AUREUS SUSCEPTIBILITIES PERFORMED ON PREVIOUS CULTURE WITHIN THE LAST 5 DAYS. Performed at [facility], [place] [address], [place], [place] [address]    Report Status 11/09/2021 FINAL  Final  Culture, blood (routine x 2)     Status: None   Collection Time: 11/09/21 12:49 PM   Specimen: BLOOD  Result Value Ref Range Status   Specimen Description   Final    BLOOD RIGHT ANTECUBITAL Performed at [facility] [address] [place], [place] [address]    Special Requests   Final    BOTTLES DRAWN AEROBIC ONLY Blood Culture results may not be optimal due to an inadequate volume of blood received in culture bottles Performed at [facility] [address] [place], [place] [address]    Culture   Final    NO GROWTH 5 DAYS Performed at [facility], [place] [address], [place],  [address]    Report Status 11/14/2021 FINAL  Final  Culture, blood (routine x 2)     Status: None   Collection Time: 11/09/21 12:49 PM   Specimen: BLOOD  Result Value Ref Range Status   Specimen Description   Final    BLOOD BLOOD LEFT HAND Performed at [place] [address] [place], [place]  [address]    Special Requests   Final    BOTTLES DRAWN AEROBIC ONLY Blood Culture  adequate volume Performed at [facility] [address], [place], [place] [address]    Culture   Final    NO GROWTH 5 DAYS Performed at [facility], [place] [address], [place], [place] [address]    Report Status 11/14/2021 FINAL  Final    Studies/Results: No results found.    Assessment/Plan:  INTERVAL HISTORY: MRI of cervical and lumbar spine performed and case reviewed by neurosurgery feel there is no indication for neurosurgical intervention   Principal Problem:   Sepsis ([place]) Active Problems:   Hypertension   Hyperlipidemia   Asthma   Acute respiratory failure with hypoxia ([place])   Vertebral osteomyelitis ([place])   MRSA bacteremia   Retropharyngeal abscess   Right lower quadrant abdominal pain    Charles Weiss. is a 70 y.o. 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:  MRI of the cervical spine shows progressive anterior epidural soft tissue thickening on a complex fluid level at C1-C2 with new edema in C1 and C2 vertebral bodies concerning for osteomyelitis and epidural abscess forming with prevertebral retropharyngeal soft tissue swelling similar, no cord compression, lumbar MRI showing grossly radiographic findings of discitis and osteomyelitis of L4-5 with ventral epidural thickening and mass-effect on the thecal sac changes concerning for osteomyelitis and L5-S1 with facet joint infection and possible small abscess  Neurosurgery reviewed his films and clinical case and feel there is no indication for neurosurgical intervention.    It  also wanted to image the thoracic spine with MRI with contrast but he is not able to tolerate an MRI at this point in time  We will therefore continue daptomycin and Teflaro for now with close monitoring of his neurological status.  I spent more than 41 minutes with the patient including face to face counseling of the patient guarding his MRSA bacteremia with multiple sites of infection in the cervical and lumbar spine, personally reviewing MRI of cervical and lumbar spine updated blood cultures CBC CMP along with  review of medical records before and during the visit and in coordination of his care.   Dr Baxter Flattery will be back on Monday.   LOS: 13 days   Alcide Evener 11/15/2021, 4:19 PM

## 2021-11-16 DIAGNOSIS — J9601 Acute respiratory failure with hypoxia: Secondary | ICD-10-CM | POA: Diagnosis not present

## 2021-11-16 DIAGNOSIS — A4102 Sepsis due to Methicillin resistant Staphylococcus aureus: Secondary | ICD-10-CM | POA: Diagnosis not present

## 2021-11-16 DIAGNOSIS — G062 Extradural and subdural abscess, unspecified: Secondary | ICD-10-CM | POA: Diagnosis not present

## 2021-11-16 DIAGNOSIS — J45909 Unspecified asthma, uncomplicated: Secondary | ICD-10-CM | POA: Diagnosis not present

## 2021-11-16 LAB — BASIC METABOLIC PANEL
Anion gap: 7 (ref 5–15)
BUN: 19 mg/dL (ref 8–23)
CO2: 26 mmol/L (ref 22–32)
Calcium: 9.1 mg/dL (ref 8.9–10.3)
Chloride: 98 mmol/L (ref 98–111)
Creatinine, Ser: 0.79 mg/dL (ref 0.61–1.24)
GFR, Estimated: >60 mL/min (ref >60)
Glucose, Bld: 118 mg/dL — ABNORMAL HIGH (ref 70–99)
Potassium: 4.1 mmol/L (ref 3.5–5.1)
Sodium: 131 mmol/L — ABNORMAL LOW (ref 135–145)

## 2021-11-16 LAB — CBC
HCT: 27.2 % — ABNORMAL LOW (ref 39.0–52.0)
Hemoglobin: 9 g/dL — ABNORMAL LOW (ref 13.0–17.0)
MCH: 30.4 pg (ref 26.0–34.0)
MCHC: 33.1 g/dL (ref 30.0–36.0)
MCV: 91.9 fL (ref 80.0–100.0)
Platelets: 312 10*3/uL (ref 150–400)
RBC: 2.96 MIL/uL — ABNORMAL LOW (ref 4.22–5.81)
RDW: 14.6 % (ref 11.5–15.5)
WBC: 5.5 10*3/uL (ref 4.0–10.5)
nRBC: 0 % (ref 0.0–0.2)

## 2021-11-16 LAB — CK: Total CK: 24 U/L — ABNORMAL LOW (ref 49–397)

## 2021-11-16 LAB — MAGNESIUM: Magnesium: 1.8 mg/dL (ref 1.7–2.4)

## 2021-11-16 MED ORDER — OXYCODONE HCL 5 MG PO TABS
15.0000 mg | ORAL_TABLET | ORAL | Status: DC | PRN
  Administered 2021-11-16 – 2021-11-25 (×21): 15 mg via ORAL
  Filled 2021-11-16 (×24): qty 3

## 2021-11-16 MED ORDER — DOCUSATE SODIUM 100 MG PO CAPS
100.0000 mg | ORAL_CAPSULE | Freq: Two times a day (BID) | ORAL | Status: DC
  Administered 2021-11-16 – 2021-11-24 (×15): 100 mg via ORAL
  Filled 2021-11-16 (×18): qty 1

## 2021-11-16 NOTE — TOC Progression Note (Addendum)
Transition of Care (TOC) - Progression Note    Patient Details  Name: Charles Weiss. MRN: [id number] Date of Birth: [birth year]  Transition of Care [facility]) CM/SW Contact  Ross Ludwig, [place] Phone Number: 11/16/2021, 4:49 PM  Clinical Narrative:     CSW attempted to speak to patient and sister to discuss bed offers for SNF, unable to leave a message.  Weekday TOC will have to follow up with bed choice on Monday Morning.  4:55pm  CSW spoke to patient's sister to provide bed offers, she agreed to [facility].  CSW attempted to call [facility] to confirm bed availability.  CSW left a message on admissions cell phone to follow up with weekday [facility] case manager.  Expected Discharge Plan: [place] Barriers to Discharge: Continued Medical Work up  Expected Discharge Plan and Services Expected Discharge Plan: [place]   Discharge Planning Services: CM Consult   Living arrangements for the past 2 months: Single Family Home                                       Social Determinants of Health (SDOH) Interventions    Readmission Risk Interventions Readmission Risk Prevention Plan 11/03/2021  Transportation Screening Complete  PCP or Specialist Appt within 3-5 Days Complete  HRI or [place] Complete  Social Work Consult for [place] Planning/Counseling Complete  Palliative Care Screening Not Applicable  Medication Review [employer]) Complete

## 2021-11-16 NOTE — Progress Notes (Signed)
PROGRESS NOTE    Charles Weiss.  VZD:[id number] DOB: 09-17-1951 DOA: 11/02/2021 PCP: System, Provider Not In   Brief Narrative:  70 year old with history of asthma, HTN comes to the hospital complains of neck pain and headache.  He was found to have MRSA bacteremia therefore infectious disease was consulted.  He was started on vancomycin.  Work-up revealed cervical epidural.  Repeat CT and MRI showed progression of osteomyelitis/discitis and epidural abscess in the lumbar and cervical region.  No cord compression was noted, neurosurgery was consulted.  He was recommended to continue medical management at this time.   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  ID following. - 2D echo is overall unremarkable, Still need TEE.  - ID is following.  There is definite progression of epidural abscess in the cervical region and discitis/osteomyelitis in the lumbar/sacral region.  Neurosurgery recommends medical management at this time. -pain control is his biggest issue. Due to significant amount of back pain we will  Increase Oxy IR to 15mg  q4 hrs prn. Colace BID, Senna prn.  Ptn refusing MRI T spine today.   Retropharyngeal edema - Seen by ENT.  IV antibiotics. - CT neck with contrast-showed worsening of cervical spine abscess.    Abdominal Pain -Resolved   Acute Encephalopathy with Agitation, improved -This is fluctuating.  He is on bedtime 25 mg of Seroquel.   Hypertension -Patient is on losartan 50 mg daily.   IV as needed's as necessary.   Hyperlipidemia -Resume statin when able     Asthma -As needed bronchodilators   Hyperbilirubinemia -Resolved   Normocytic Anemia --Anemia of chronic disease.  Continue to closely monitor.   Obesity -Weight loss and outpatient dietary counseling     DVT prophylaxis: Lovenox Code Status: DNR Family Communication:  Sister updated periodically  Status is: Inpatient  Remains inpatient appropriate because: Ongoing evaluation for cervical epidural abscess    Subjective: Lots of back discomfort especially with movement. Remains afebrile.   Review of Systems Otherwise negative except as per HPI, including: General = no fevers, chills, dizziness,  fatigue HEENT/EYES = negative for loss of vision, double vision, blurred vision,  sore throa Cardiovascular= negative for chest pain, palpitation Respiratory/lungs= negative for shortness of breath, cough, wheezing; hemoptysis,  Gastrointestinal= negative for nausea, vomiting, abdominal pain Genitourinary= negative for Dysuria MSK = Negative for arthralgia, myalgias Neurology= Negative for headache, numbness, tingling  Psychiatry= Negative for suicidal and homocidal ideation Skin= Negative for Rash    Examination: Constitutional: Mild distress due to his back pain.  Respiratory: Clear to auscultation bilaterally Cardiovascular: Normal sinus rhythm, no rubs Abdomen: Nontender nondistended good bowel sounds Musculoskeletal: No edema noted Skin: No rashes seen Neurologic: CN 2-12 grossly intact.  And nonfocal Psychiatric: Normal judgment and insight. Alert and oriented x 3. Normal mood.    Objective: Vitals:   11/15/21 2105 11/16/21 0415 11/16/21 0824 11/16/21 0826  BP: 132/77 117/65    Pulse: 86 80    Resp: 16 18    Temp: 98.3 F (36.8 C) 98  F (36.7 C)    TempSrc: Oral Oral    SpO2: 91% 93% 94% 94%  Weight:      Height:        Intake/Output Summary (Last 24 hours) at 11/16/2021  1107 Last data filed at 11/15/2021 1700 Gross per 24 hour  Intake 120 ml  Output 600 ml  Net -480 ml   Filed Weights   11/02/21 0701 11/11/21 1212  Weight: 113.4 kg 112.4 kg     Data Reviewed:   CBC: Recent Labs  Lab 11/10/21 0431 11/11/21 0434 11/12/21 0332 11/13/21 0325 11/14/21 0429 11/15/21 0342 11/16/21 0452  WBC 9.5 8.7 7.6 8.2 8.1 5.0 5.5  NEUTROABS 7.7 6.9 6.0  --   --   --   --   HGB 9.3* 8.5* 8.8* 8.9* 9.0* 8.9* 9.0*  HCT 27.1* 25.4* 26.1* 26.0* 26.5* 26.2* 27.2*  MCV 89.7 91.0 91.6 90.3 90.8 92.9 91.9  PLT 209 217 257 275 307 294 932   Basic Metabolic Panel: Recent Labs  Lab 11/10/21 0431 11/11/21 0434 11/12/21 0332 11/13/21 0325 11/14/21 0429 11/15/21 0342 11/16/21 0452  NA 132* 133* 130* 130* 130* 130* 131*  K 3.3* 4.3 4.0 3.9 4.6 4.0 4.1  CL 99 103 97* 95* 96* 95* 98  CO2 26 24 26 26 26 27 26   GLUCOSE 137* 119* 104* 108* 124* 114* 118*  BUN 19 15 12 14 22 15 19   CREATININE 0.64 0.61 0.71 0.71 0.96 0.68 0.79  CALCIUM 8.3* 8.5* 8.7* 8.7* 8.9 9.0 9.1  MG 2.1 1.9 1.8 1.8 2.1 1.8 1.8  PHOS 3.2 3.7 4.4  --   --   --   --    GFR: Estimated Creatinine Clearance: 111.2 mL/min (by C-G formula based on SCr of 0.79 mg/dL). Liver Function Tests: Recent Labs  Lab 11/10/21 0431 11/11/21 0434 11/12/21 0332  AST 56* 38 29  ALT 58* 52* 58*  ALKPHOS 72 70 67  BILITOT 1.2 0.9 0.9  PROT 6.6 6.4* 6.9  ALBUMIN 2.1* 2.1* 2.1*   No results for input(s): LIPASE, AMYLASE in the last 168 hours. No results for input(s): AMMONIA in the last 168 hours. Coagulation Profile: No results for input(s): INR, PROTIME in the last 168 hours. Cardiac Enzymes: Recent Labs  Lab 11/09/21 1310 11/16/21 0452  CKTOTAL 122 24*   BNP (last 3 results) No results for input(s): PROBNP in the last 8760 hours. HbA1C: No results for input(s): HGBA1C in the last 72 hours. CBG: No results for input(s): GLUCAP in the last 168 hours.  Lipid Profile: No results for input(s):  CHOL, HDL, LDLCALC, TRIG, CHOLHDL, LDLDIRECT in the last 72 hours. Thyroid Function Tests: No results for input(s): TSH, T4TOTAL, FREET4, T3FREE, THYROIDAB in the last 72 hours. Anemia Panel: No results for input(s): VITAMINB12, FOLATE, FERRITIN, TIBC, IRON, RETICCTPCT in the last 72 hours. Sepsis Labs: No results for input(s): PROCALCITON, LATICACIDVEN in the last 168 hours.  Recent Results (from the past 240 hour(s))  Culture, blood (routine x 2)     Status: Abnormal   Collection Time: 11/07/21  9:28 AM   Specimen: BLOOD  Result Value Ref Range Status   Specimen Description   Final    BLOOD LEFT WRIST Performed at [facility] [address], [place], [place] [address]    Special Requests   Final    BOTTLES DRAWN AEROBIC AND ANAEROBIC Blood Culture adequate volume Performed at [facility] [address] [place], [place] [address]  Culture  Setup Time   Final    GRAM POSITIVE COCCI ANAEROBIC BOTTLE ONLY CRITICAL VALUE NOTED.  VALUE IS CONSISTENT WITH PREVIOUSLY REPORTED AND CALLED VALUE. Performed at [facility], [place] [address], [place], [place] [address]    Culture METHICILLIN RESISTANT STAPHYLOCOCCUS AUREUS (A)  Final   Report Status 11/11/2021 FINAL  Final  Culture, blood (routine x 2)     Status: Abnormal   Collection Time: 11/07/21  9:29 AM   Specimen: BLOOD RIGHT HAND  Result Value Ref Range Status   Specimen Description   Final    BLOOD RIGHT HAND Performed at [facility] [address], [place], [place] [address]    Special Requests   Final    BOTTLES DRAWN AEROBIC ONLY Blood Culture adequate volume Performed at [facility] [address], [place], [place] [address]    Culture  Setup Time   Final    GRAM POSITIVE COCCI AEROBIC BOTTLE ONLY CRITICAL VALUE NOTED.  VALUE IS CONSISTENT WITH PREVIOUSLY REPORTED AND CALLED VALUE.    Culture (A)  Final    STAPHYLOCOCCUS  AUREUS SUSCEPTIBILITIES PERFORMED ON PREVIOUS CULTURE WITHIN THE LAST 5 DAYS. Performed at [facility], [place] [address], [place], [place] [address]    Report Status 11/09/2021 FINAL  Final  Culture, blood (routine x 2)     Status: None   Collection Time: 11/09/21 12:49 PM   Specimen: BLOOD  Result Value Ref Range Status   Specimen Description   Final    BLOOD RIGHT ANTECUBITAL Performed at [facility] [address] [place], [place] [address]    Special Requests   Final    BOTTLES DRAWN AEROBIC ONLY Blood Culture results may not be optimal due to an inadequate volume of blood received in culture bottles Performed at [facility] [address] [place], [place] [address]    Culture   Final    NO GROWTH 5 DAYS Performed at [facility], [place] [address], [place], [place] [address]    Report Status 11/14/2021 FINAL  Final  Culture, blood (routine x 2)     Status: None   Collection Time: 11/09/21 12:49 PM   Specimen: BLOOD  Result Value Ref Range Status   Specimen Description   Final    BLOOD BLOOD LEFT HAND Performed at [facility] [address], [place], [place] [address]    Special Requests   Final    BOTTLES DRAWN AEROBIC ONLY Blood Culture adequate volume Performed at [facility] [address], [place], [place] [address]    Culture   Final    NO GROWTH 5 DAYS Performed at [facility], [place] [address], [place], [place] [address]    Report Status 11/14/2021 FINAL  Final         Radiology Studies: No results found.      Scheduled Meds:  albuterol  2.5 mg Nebulization BID   Chlorhexidine Gluconate Cloth  6 each Topical Daily   enoxaparin (LOVENOX) injection  60 mg Subcutaneous Q24H   gabapentin  300 mg Oral TID   losartan  50 mg Oral Daily   mometasone-formoterol  2 puff Inhalation BID   pantoprazole  40 mg Oral Daily   QUEtiapine  25 mg Oral QHS   sodium  chloride flush  10-40 mL Intracatheter Q12H   sodium chloride flush  3 mL Intravenous Q12H   Continuous  Infusions:  sodium chloride 10 mL/hr at 11/03/21 1032   ceFTAROline (TEFLARO) IV 600 mg (11/16/21 9678)   DAPTOmycin (CUBICIN)  IV 750 mg (11/15/21 2122)     LOS: 14 days   Time spent= 35 mins    Zaki Gertsch Arsenio Loader, MD [facility]  If 7PM-7AM, please contact night-coverage  11/16/2021, 11:07 AM

## 2021-11-17 DIAGNOSIS — J9601 Acute respiratory failure with hypoxia: Secondary | ICD-10-CM | POA: Diagnosis not present

## 2021-11-17 DIAGNOSIS — A4102 Sepsis due to Methicillin resistant Staphylococcus aureus: Secondary | ICD-10-CM | POA: Diagnosis not present

## 2021-11-17 DIAGNOSIS — M4642 Discitis, unspecified, cervical region: Secondary | ICD-10-CM | POA: Diagnosis not present

## 2021-11-17 DIAGNOSIS — M4647 Discitis, unspecified, lumbosacral region: Secondary | ICD-10-CM | POA: Diagnosis not present

## 2021-11-17 NOTE — Progress Notes (Signed)
PROGRESS NOTE    Charles Weiss.  HQI:[id number] DOB: April 02, 1951 DOA: 11/02/2021 PCP: System, Provider Not In   Brief Narrative:  70 year old with history of asthma, HTN comes to the hospital complains of neck pain and headache.  He was found to have MRSA bacteremia therefore infectious disease was consulted.  He was started on vancomycin.  Work-up revealed cervical epidural.  Repeat CT and MRI showed progression of osteomyelitis/discitis and epidural abscess in the lumbar and cervical region.  No cord compression was noted, neurosurgery was consulted.  He was recommended to continue medical management at this time.   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  ID following. - 2D echo is overall unremarkable, Still need TEE.  - ID is following.  There is definite progression of epidural abscess in the cervical region and discitis/osteomyelitis in the lumbar/sacral region.  Neurosurgery recommends medical management at this time. No Needs for MRI thoracic spine per ID for now. Will schedule for TEE later this week, tentatively planned for Thursday.  -pain control is his biggest issue. Due to significant amount of back pain we will  Increase Oxy IR to 15mg  q4 hrs prn. Colace BID, Senna prn.    Retropharyngeal edema - Seen by ENT.  IV antibiotics. - CT neck with contrast-showed worsening of cervical spine abscess.    Abdominal Pain -Resolved   Acute Encephalopathy with Agitation, improved -This is fluctuating.  He  is on bedtime 25 mg of Seroquel.   Hypertension -Patient is on losartan 50 mg daily.  IV as needed's as necessary.   Hyperlipidemia -Resume statin when able     Asthma -As needed bronchodilators   Hyperbilirubinemia -Resolved   Normocytic Anemia --Anemia of chronic disease.  Continue to closely monitor.   Obesity -Weight loss and outpatient dietary counseling     DVT prophylaxis: Lovenox Code Status: DNR Family Communication:  Sister updated periodically  Status is: Inpatient  Remains inpatient appropriate because: Ongoing evaluation for cervical epidural abscess    Subjective: Still has back discomfort. Remains afebrile.   Review of Systems Otherwise negative except as per HPI, including: General = no fevers, chills, dizziness,  fatigue HEENT/EYES = negative for loss of vision, double vision, blurred vision,  sore throa Cardiovascular= negative for chest pain, palpitation Respiratory/lungs= negative for shortness of breath, cough, wheezing; hemoptysis,  Gastrointestinal= negative for nausea, vomiting, abdominal pain Genitourinary= negative for Dysuria MSK = Negative for arthralgia, myalgias Neurology= Negative for headache, numbness, tingling  Psychiatry= Negative for suicidal and homocidal ideation Skin= Negative for Rash    Examination: Constitutional: some distress with movement due to his back pain.  Respiratory: Clear to auscultation bilaterally Cardiovascular: Normal sinus rhythm, no rubs Abdomen: Nontender nondistended good bowel sounds Musculoskeletal: No edema noted Skin: No rashes seen Neurologic: CN 2-12 grossly intact.  And nonfocal Psychiatric: Normal judgment and insight. Alert and oriented x 3. Normal mood.    Objective: Vitals:   11/16/21 0826 11/16/21 2035 11/17/21 0701 11/17/21 0807  BP:   118/72   Pulse:  79   Resp:   18   Temp:   98 F (36.7 C)   TempSrc:      SpO2: 94% 92% 92% 93%  Weight:      Height:         Intake/Output Summary (Last 24 hours) at 11/17/2021 1127 Last data filed at 11/17/2021 0700 Gross per 24 hour  Intake --  Output 1000 ml  Net -1000 ml   Filed Weights   11/02/21 0701 11/11/21 1212  Weight: 113.4 kg 112.4 kg     Data Reviewed:   CBC: Recent Labs  Lab 11/11/21 0434 11/12/21 0332 11/13/21 0325 11/14/21 0429 11/15/21 0342 11/16/21 0452  WBC 8.7 7.6 8.2 8.1 5.0 5.5  NEUTROABS 6.9 6.0  --   --   --   --   HGB 8.5* 8.8* 8.9* 9.0* 8.9* 9.0*  HCT 25.4* 26.1* 26.0* 26.5* 26.2* 27.2*  MCV 91.0 91.6 90.3 90.8 92.9 91.9  PLT 217 257 275 307 294 073   Basic Metabolic Panel: Recent Labs  Lab 11/11/21 0434 11/12/21 0332 11/13/21 0325 11/14/21 0429 11/15/21 0342 11/16/21 0452  NA 133* 130* 130* 130* 130* 131*  K 4.3 4.0 3.9 4.6 4.0 4.1  CL 103 97* 95* 96* 95* 98  CO2 24 26 26 26 27 26   GLUCOSE 119* 104* 108* 124* 114* 118*  BUN 15 12 14 22 15 19   CREATININE 0.61 0.71 0.71 0.96 0.68 0.79  CALCIUM 8.5* 8.7* 8.7* 8.9 9.0 9.1  MG 1.9 1.8 1.8 2.1 1.8 1.8  PHOS 3.7 4.4  --   --   --   --    GFR: Estimated Creatinine Clearance: 111.2 mL/min (by C-G formula based on SCr of 0.79 mg/dL). Liver Function Tests: Recent Labs  Lab 11/11/21 0434 11/12/21 0332  AST 38 29  ALT 52* 58*  ALKPHOS 70 67  BILITOT 0.9 0.9  PROT 6.4* 6.9  ALBUMIN 2.1* 2.1*   No results for input(s): LIPASE, AMYLASE in the last 168 hours. No results for input(s): AMMONIA in the last 168 hours. Coagulation Profile: No results for input(s): INR, PROTIME in the last 168 hours. Cardiac Enzymes: Recent Labs  Lab 11/16/21 0452  CKTOTAL 24*   BNP (last 3 results) No results for input(s): PROBNP in the last 8760 hours. HbA1C: No results for input(s): HGBA1C in the last 72 hours. CBG: No results for input(s): GLUCAP in the last 168 hours.  Lipid Profile: No results for input(s): CHOL, HDL, LDLCALC, TRIG, CHOLHDL, LDLDIRECT in the last 72 hours. Thyroid Function Tests: No  results for input(s): TSH, T4TOTAL, FREET4, T3FREE, THYROIDAB in the last 72 hours. Anemia Panel: No results for input(s): VITAMINB12, FOLATE, FERRITIN, TIBC, IRON, RETICCTPCT in the last 72 hours. Sepsis Labs: No results for input(s): PROCALCITON, LATICACIDVEN in the last 168 hours.  Recent Results (from the past 240 hour(s))  Culture, blood (routine x 2)     Status: None   Collection Time: 11/09/21 12:49 PM   Specimen: BLOOD  Result Value Ref Range Status   Specimen Description   Final    BLOOD RIGHT ANTECUBITAL Performed at [facility] [address], [place], [place] [address]    Special Requests   Final    BOTTLES DRAWN AEROBIC ONLY Blood Culture results may not be optimal due to an inadequate volume of blood received in culture bottles Performed at [facility] [address], [place], [place] [address]    Culture   Final  NO GROWTH 5 DAYS Performed at [facility], [place] [address], [place], [place] [address]    Report Status 11/14/2021 FINAL  Final  Culture, blood (routine x 2)     Status: None   Collection Time: 11/09/21 12:49 PM   Specimen: BLOOD  Result Value Ref Range Status   Specimen Description   Final    BLOOD BLOOD LEFT HAND Performed at [facility] [address] [place], [place] [address]    Special Requests   Final    BOTTLES DRAWN AEROBIC ONLY Blood Culture adequate volume Performed at [facility] [address], [place], [place] [address]    Culture   Final    NO GROWTH 5 DAYS Performed at [facility], [place] [address], [place], [place] [address]    Report Status 11/14/2021 FINAL  Final         Radiology Studies: No results found.      Scheduled Meds:  Chlorhexidine Gluconate Cloth  6 each Topical Daily   docusate sodium  100 mg Oral BID   enoxaparin (LOVENOX) injection  60 mg Subcutaneous Q24H   gabapentin  300 mg Oral TID   losartan  50 mg  Oral Daily   mometasone-formoterol  2 puff Inhalation BID   pantoprazole  40 mg Oral Daily   QUEtiapine  25 mg Oral QHS   sodium chloride flush  10-40 mL Intracatheter Q12H   sodium chloride flush  3 mL Intravenous Q12H   Continuous Infusions:  sodium chloride 10 mL/hr at 11/03/21 1032   ceFTAROline (TEFLARO) IV 600 mg (11/17/21 0657)   DAPTOmycin (CUBICIN)  IV 750 mg (11/16/21 2005)     LOS: 15 days   Time spent= 35 mins    Mariellen Blaney Arsenio Loader, MD [facility]  If 7PM-7AM, please contact night-coverage  11/17/2021, 11:27 AM

## 2021-11-17 NOTE — Progress Notes (Signed)
Pharmacy Antibiotic Note  Charles Weiss. is a 70 y.o. 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 Repeat blood cultures on 11/17 and 11/18 both with MRSA.  Pharmacy consulted by ID to switch vancomycin to daptomycin and ceftaroline.   11/17/21 10:20 AM CK 24 (11/27) SCr stable WBC WNL afebrile  Plan:  Continue Daptomycin 8mg /kg (750mg ) IV q24h (using adjusted body weight) Weekly CK (next on 12/4) Continue Ceftaroline 600mg  IV q8h Continue to monitor renal function, cultures, clinical course, ID recommendations    Height: 6' (182.9 cm) Weight: 112.4 kg (247 lb 12.8 oz) IBW/kg (Calculated) : 77.6  Temp (24hrs), Avg:98 F (36.7 C), Min:98 F (36.7 C), Max:98 F (36.7 C)  Recent Labs  Lab 11/12/21 0332 11/13/21 0325 11/14/21 0429 11/15/21 0342 11/16/21 0452  WBC 7.6 8.2 8.1 5.0 5.5  CREATININE 0.71 0.71 0.96 0.68 0.79     Estimated Creatinine Clearance: 111.2 mL/min (by C-G formula based on SCr of 0.79 mg/dL).    Allergies  Allergen Reactions   Elavil [Amitriptyline] Other (See Comments)   Tetracyclines & Related Other (See Comments)   Antimicrobials this admission: 11/13 Vanc >> 11/20 11/13 Cefepime >> 11/14 11/13 Flagyl x1 in ED 11/14 Acyclovir >> 11/14 11/14 Ampicillin >> 11/14 11/20 Ceftaroline >>  11/20 Daptomycin >>  Microbiology results: 11/13 BCx: 4/4 MRSA 11/13 UCx:  > 100K MRSA  11/13 Resp PCR: negative for COVID/Influenza 11/15 BCx: NGF 11/17 BCx: MRSA 11/18 BCx: MRSA 11/20 BCx: NGF  Thank you for allowing pharmacy to be a part of this patient's care.   [name]  11/17/2021, 10:19 AM

## 2021-11-18 DIAGNOSIS — J45909 Unspecified asthma, uncomplicated: Secondary | ICD-10-CM | POA: Diagnosis not present

## 2021-11-18 DIAGNOSIS — A4102 Sepsis due to Methicillin resistant Staphylococcus aureus: Secondary | ICD-10-CM | POA: Diagnosis not present

## 2021-11-18 DIAGNOSIS — J9601 Acute respiratory failure with hypoxia: Secondary | ICD-10-CM | POA: Diagnosis not present

## 2021-11-18 DIAGNOSIS — M4647 Discitis, unspecified, lumbosacral region: Secondary | ICD-10-CM | POA: Diagnosis not present

## 2021-11-18 LAB — BASIC METABOLIC PANEL
Anion gap: 6 (ref 5–15)
BUN: 26 mg/dL — ABNORMAL HIGH (ref 8–23)
CO2: 28 mmol/L (ref 22–32)
Calcium: 9 mg/dL (ref 8.9–10.3)
Chloride: 97 mmol/L — ABNORMAL LOW (ref 98–111)
Creatinine, Ser: 0.97 mg/dL (ref 0.61–1.24)
GFR, Estimated: >60 mL/min (ref >60)
Glucose, Bld: 119 mg/dL — ABNORMAL HIGH (ref 70–99)
Potassium: 4.1 mmol/L (ref 3.5–5.1)
Sodium: 131 mmol/L — ABNORMAL LOW (ref 135–145)

## 2021-11-18 LAB — CBC
HCT: 28.2 % — ABNORMAL LOW (ref 39.0–52.0)
Hemoglobin: 9.3 g/dL — ABNORMAL LOW (ref 13.0–17.0)
MCH: 30.9 pg (ref 26.0–34.0)
MCHC: 33 g/dL (ref 30.0–36.0)
MCV: 93.7 fL (ref 80.0–100.0)
Platelets: 286 10*3/uL (ref 150–400)
RBC: 3.01 MIL/uL — ABNORMAL LOW (ref 4.22–5.81)
RDW: 14.7 % (ref 11.5–15.5)
WBC: 4.1 10*3/uL (ref 4.0–10.5)
nRBC: 0 % (ref 0.0–0.2)

## 2021-11-18 LAB — MAGNESIUM: Magnesium: 2.1 mg/dL (ref 1.7–2.4)

## 2021-11-18 MED ORDER — ALBUTEROL SULFATE (2.5 MG/3ML) 0.083% IN NEBU
2.5000 mg | INHALATION_SOLUTION | Freq: Three times a day (TID) | RESPIRATORY_TRACT | Status: DC
  Administered 2021-11-19 – 2021-11-20 (×6): 2.5 mg via RESPIRATORY_TRACT
  Filled 2021-11-18 (×7): qty 3

## 2021-11-18 MED ORDER — BISACODYL 5 MG PO TBEC
DELAYED_RELEASE_TABLET | ORAL | Status: AC
  Filled 2021-11-18: qty 2

## 2021-11-18 MED ORDER — POLYETHYLENE GLYCOL 3350 17 G PO PACK
17.0000 g | PACK | Freq: Every day | ORAL | Status: DC
  Administered 2021-11-18: 17 g via ORAL
  Filled 2021-11-18: qty 1

## 2021-11-18 MED ORDER — BISACODYL 5 MG PO TBEC
10.0000 mg | DELAYED_RELEASE_TABLET | Freq: Every day | ORAL | Status: AC
  Administered 2021-11-18: 10 mg via ORAL
  Filled 2021-11-18 (×2): qty 2

## 2021-11-18 MED ORDER — POLYETHYLENE GLYCOL 3350 17 G PO PACK
PACK | ORAL | Status: AC
  Filled 2021-11-18: qty 1

## 2021-11-18 NOTE — Progress Notes (Signed)
Physical Therapy Treatment Patient Details Name: Charles Weiss. MRN: [id number] DOB: 01/28/1951 Today's Date: 11/18/2021   History of Present Illness 70 yo male presents to [facility] on 11/13 with neck pain, headaches x3 days, recent diagnosis of flu with admission 11/4-8. CTA head/neck shows retropharyngeal edema without visible pharyngeal or spinal source, MRI shows spine diffuse lumbar epidural enhancement without abscess; L4-5 OM changes; C5-6 prevertebral enhancement no osseous finding. workup for sepsis and mrsa along with spinal osseous involvement/discitis. PMH includes HTN, HLD, asthma, GERD.    PT Comments    Max encouragement for pt to attempt mobility on today. Mobility remains limited by pain. Pt unable to sit EOB on today. Encouraged pt to work on LE ROM exercises in bed. Continue to recommend SNF.    Recommendations for follow up therapy are one component of a multi-disciplinary discharge planning process, led by the attending physician.  Recommendations may be updated based on patient status, additional functional criteria and insurance authorization.  Follow Up Recommendations  Skilled nursing-short term rehab (<3 hours/day)     Assistance Recommended at Discharge Frequent or constant Supervision/Assistance  Equipment Recommendations  None recommended by PT    Recommendations for Other Services       Precautions / Restrictions Precautions Precautions: Fall Restrictions Weight Bearing Restrictions: No     Mobility  Bed Mobility Overal bed mobility: Needs Assistance Bed Mobility: Rolling;Sidelying to Sit;Sit to Supine Rolling: Min assist Sidelying to sit: Max assist   Sit to supine: Min assist   General bed mobility comments: Rolled to R side witih Min guard-Min A. Unable to tolerate rolling to L side. Rolled into sidelying-Max A for trunk and bil LEs. Pt made it 40% of the way into sitting but then abruptly returned to supine 2* pain.    Transfers                    General transfer comment: unable to attempt d/t pain    Ambulation/Gait                   Stairs             Wheelchair Mobility    Modified Rankin (Stroke Patients Only)       Balance                                            Cognition Arousal/Alertness: Awake/alert Behavior During Therapy: Anxious Overall Cognitive Status: Impaired/Different from baseline                               Problem Solving: Requires verbal cues General Comments: Pt following simpel cues. Internally distracted by pain. Poor awareness and unsafely/impulsively throwing himself back in to supine with pain. Requires max encouragement to participate.        Exercises General Exercises - Lower Extremity Heel Slides: AROM;Both;5 reps;Supine    General Comments        Pertinent Vitals/Pain Pain Assessment: Faces Faces Pain Scale: Hurts whole lot Pain Location: back, L hip, cervical region Pain Descriptors / Indicators: Sore;Discomfort;Grimacing;Moaning;Sharp;Shooting;Guarding Pain Intervention(s): Limited activity within patient's tolerance;Monitored during session;Repositioned    Home Living                          Prior Function  PT Goals (current goals can now be found in the care plan section) Progress towards PT goals: Not progressing toward goals - comment (limited by pain)    Frequency    Min 2X/week      PT Plan Current plan remains appropriate    Co-evaluation              AM-PAC PT "6 Clicks" Mobility   Outcome Measure  Help needed turning from your back to your side while in a flat bed without using bedrails?: Total Help needed moving from lying on your back to sitting on the side of a flat bed without using bedrails?: Total Help needed moving to and from a bed to a chair (including a wheelchair)?: Total Help needed standing up from a chair using your arms (e.g.,  wheelchair or bedside chair)?: Total Help needed to walk in hospital room?: Total Help needed climbing 3-5 steps with a railing? : Total 6 Click Score: 6    End of Session   Activity Tolerance: Patient limited by pain Patient left: in bed;with call bell/phone within reach;with bed alarm set   PT Visit Diagnosis: Other abnormalities of gait and mobility (R26.89);Pain;Muscle weakness (generalized) (M62.81) Pain - part of body:  (back, neck)     Time: 1771-1657 PT Time Calculation (min) (ACUTE ONLY): 28 min  Charges:  $Therapeutic Activity: 23-37 mins                         Doreatha Massed, PT Acute Rehabilitation  Office: [phone] Pager: [phone]

## 2021-11-18 NOTE — Progress Notes (Signed)
PROGRESS NOTE    Charles Weiss [place].  LFY:[id number] DOB: 07-Dec-1951 DOA: 11/02/2021 PCP: System, Provider Not In   Brief Narrative:  70 year old with history of asthma, HTN comes to the hospital complains of neck pain and headache.  He was found to have MRSA bacteremia therefore infectious disease was consulted.  He was started on vancomycin.  Work-up revealed cervical epidural.  Repeat CT and MRI showed progression of osteomyelitis/discitis and epidural abscess in the lumbar and cervical region.  No cord compression was noted, neurosurgery was consulted.  He was recommended to continue medical management at this time.  PT to continue working with the patient.   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  ID following. - 2D echo is overall unremarkable, Still need TEE.  - ID is following.  There is definite progression of epidural abscess in the cervical region and discitis/osteomyelitis in the lumbar/sacral region.  Neurosurgery recommends medical management at this time.  Holding off on MRI thoracic spine.  Tentative plans for TEE Thursday. - His pain is now better controlled on oxycodone IR 15 mg every 4 hours as needed.  Bowel regimen has been prescribed.  Retropharyngeal edema - Seen by ENT.  IV antibiotics. - CT neck with contrast-showed worsening of cervical spine abscess.    Abdominal Pain -Resolved   Acute Encephalopathy with Agitation, improved -This is fluctuating.  He is on bedtime 25 mg of Seroquel.    Hypertension -Patient is on losartan 50 mg daily.  IV as needed's as necessary.   Hyperlipidemia -Resume statin when able     Asthma -As needed bronchodilators   Hyperbilirubinemia -Resolved   Normocytic Anemia --Anemia of chronic disease.  Continue to closely monitor.   Obesity -Weight loss and outpatient dietary counseling   DVT prophylaxis: Lovenox Code Status: DNR Family Communication:  Sister updated periodically. Called [facility] today  Status is: Inpatient  Remains inpatient appropriate because: Ongoing evaluation for persistent MRSA bacteremia  Subjective: Back pain better today, moving his legs better today.   Review of Systems Otherwise negative except as per HPI, including: General = no fevers, chills, dizziness,  fatigue HEENT/EYES = negative for loss of vision, double vision, blurred vision,  sore throa Cardiovascular= negative for chest pain, palpitation Respiratory/lungs= negative for shortness of breath, cough, wheezing; hemoptysis,  Gastrointestinal= negative for nausea, vomiting, abdominal pain Genitourinary= negative for Dysuria MSK = Negative for arthralgia, myalgias Neurology= Negative for headache, numbness, tingling  Psychiatry= Negative for suicidal and homocidal ideation Skin= Negative for Rash  Examination: Constitutional: Not in acute distress Respiratory: Clear to auscultation bilaterally Cardiovascular: Normal sinus rhythm, no rubs Abdomen: Nontender nondistended good bowel sounds Musculoskeletal: more flexion and extension of his b/l LE.  Skin: No rashes seen Neurologic: CN 2-12 grossly intact.  And nonfocal Psychiatric: Normal judgment and insight. Alert and oriented x 3. Normal mood.  Midline in place.   Objective: Vitals:   11/17/21 2111 11/17/21 2230 11/18/21 0524 11/18/21 0808  BP:  100/69 101/65   Pulse:  83 71   Resp:  18 16   Temp:  98.4 F (36.9 C) 98 F (36.7 C)   TempSrc:  Oral    SpO2: 95% 94% 93% 94%  Weight:       Height:        Intake/Output Summary (Last 24 hours) at 11/18/2021 1318 Last data filed at 11/18/2021 1138 Gross per 24 hour  Intake 100 ml  Output 650 ml  Net -550 ml   Filed Weights   11/02/21 0701 11/11/21 1212  Weight: 113.4 kg 112.4 kg     Data Reviewed:   CBC: Recent Labs  Lab 11/12/21 0332 11/13/21 0325 11/14/21 0429 11/15/21 0342 11/16/21 0452 11/18/21 0000  WBC 7.6 8.2 8.1 5.0 5.5 4.1  NEUTROABS 6.0  --   --   --   --   --   HGB 8.8* 8.9* 9.0* 8.9* 9.0* 9.3*  HCT 26.1* 26.0* 26.5* 26.2* 27.2* 28.2*  MCV 91.6 90.3 90.8 92.9 91.9 93.7  PLT 257 275 307 294 312 858   Basic Metabolic Panel: Recent Labs  Lab 11/12/21 0332 11/13/21 0325 11/14/21 0429 11/15/21 0342 11/16/21 0452 11/18/21 0000  NA 130* 130* 130* 130* 131* 131*  K 4.0 3.9 4.6 4.0 4.1 4.1  CL 97* 95* 96* 95* 98 97*  CO2 26 26 26 27 26 28   GLUCOSE 104* 108* 124* 114* 118* 119*  BUN 12 14 22 15 19  26*  CREATININE 0.71 0.71 0.96 0.68 0.79 0.97  CALCIUM 8.7* 8.7* 8.9 9.0 9.1 9.0  MG 1.8 1.8 2.1 1.8 1.8 2.1  PHOS 4.4  --   --   --   --   --    GFR: Estimated Creatinine Clearance: 91.7 mL/min (by C-G formula based on SCr of 0.97 mg/dL). Liver Function Tests: Recent Labs  Lab 11/12/21 0332  AST 29  ALT 58*  ALKPHOS 67  BILITOT 0.9  PROT 6.9  ALBUMIN 2.1*   No results for input(s): LIPASE, AMYLASE in the last 168 hours. No results for input(s): AMMONIA in the last 168 hours. Coagulation Profile: No results for input(s): INR, PROTIME in the last 168 hours. Cardiac Enzymes: Recent Labs  Lab 11/16/21 0452  CKTOTAL 24*   BNP (last 3 results) No results for input(s): PROBNP in the last 8760 hours. HbA1C: No results for input(s): HGBA1C in the last 72 hours. CBG: No results for input(s): GLUCAP in the last 168 hours.  Lipid Profile: No results for input(s): CHOL, HDL, LDLCALC, TRIG, CHOLHDL, LDLDIRECT in the last 72 hours. Thyroid Function Tests: No results for input(s): TSH,  T4TOTAL, FREET4, T3FREE, THYROIDAB in the last 72 hours. Anemia Panel: No results for input(s): VITAMINB12, FOLATE, FERRITIN, TIBC, IRON, RETICCTPCT in the last 72 hours. Sepsis Labs: No results for input(s): PROCALCITON, LATICACIDVEN in the last 168 hours.  Recent Results (from the past 240 hour(s))  Culture, blood (routine x 2)     Status: None   Collection Time: 11/09/21 12:49 PM   Specimen: BLOOD  Result Value Ref Range Status   Specimen Description   Final    BLOOD RIGHT ANTECUBITAL Performed at [facility] [address], [place], [place] [address]    Special Requests   Final    BOTTLES DRAWN AEROBIC ONLY Blood Culture results may not be optimal due to an inadequate volume of blood received in culture bottles Performed at [facility] [address] [place], [place] [address]    Culture   Final    NO GROWTH 5  DAYS Performed at [facility], [place] [address] [place], [place] [address]    Report Status 11/14/2021 FINAL  Final  Culture, blood (routine x 2)     Status: None   Collection Time: 11/09/21 12:49 PM   Specimen: BLOOD  Result Value Ref Range Status   Specimen Description   Final    BLOOD BLOOD LEFT HAND Performed at [facility] [address] [place], [place] [address]    Special Requests   Final    BOTTLES DRAWN AEROBIC ONLY Blood Culture adequate volume Performed at [facility] [address], [place], [place] [address]    Culture   Final    NO GROWTH 5 DAYS Performed at [facility], [place] [address] [place],  [address]    Report Status 11/14/2021 FINAL  Final         Radiology Studies: No results found.      Scheduled Meds:  Chlorhexidine Gluconate Cloth  6 each Topical Daily   docusate sodium  100 mg Oral BID   enoxaparin (LOVENOX) injection  60 mg Subcutaneous Q24H   gabapentin  300 mg Oral TID   losartan  50 mg Oral Daily    mometasone-formoterol  2 puff Inhalation BID   pantoprazole  40 mg Oral Daily   QUEtiapine  25 mg Oral QHS   sodium chloride flush  10-40 mL Intracatheter Q12H   sodium chloride flush  3 mL Intravenous Q12H   Continuous Infusions:  sodium chloride 10 mL/hr at 11/03/21 1032   ceFTAROline (TEFLARO) IV 600 mg (11/18/21 0753)   DAPTOmycin (CUBICIN)  IV 750 mg (11/17/21 2237)     LOS: 16 days   Time spent= 35 mins    Saraiyah Hemminger Arsenio Loader, MD [facility]  If 7PM-7AM, please contact night-coverage  11/18/2021, 1:18 PM

## 2021-11-18 NOTE — TOC Progression Note (Signed)
Transition of Care (TOC) - Progression Note    Patient Details  Name: Charles Weiss. MRN: [id number] Date of Birth: [birth year]  Transition of Care [facility]) CM/SW Contact  Jyasia Markoff, Juliann Pulse, RN Phone Number: 11/18/2021, 11:49 AM  Clinical Narrative: Noted for TEE,awaiting bld cx. [address] rep starr-awaiting if iv long term iv abx needed @ SNF.continue to monitor.     Expected Discharge Plan: [place] Barriers to Discharge: Continued Medical Work up  Expected Discharge Plan and Services Expected Discharge Plan: [place]   Discharge Planning Services: CM Consult   Living arrangements for the past 2 months: Single Family Home                                       Social Determinants of Health (SDOH) Interventions    Readmission Risk Interventions Readmission Risk Prevention Plan 11/03/2021  Transportation Screening Complete  PCP or Specialist Appt within 3-5 Days Complete  HRI or [place] Complete  Social Work Consult for [place] Planning/Counseling Complete  Palliative Care Screening Not Applicable  Medication Review [employer]) Complete

## 2021-11-18 NOTE — Progress Notes (Signed)
    [facility] for Infectious Disease    Date of Admission:  11/02/2021      ID: Charles Weiss. is a 70 y.o. male with  diffuse vertebral discitis/osteomyelitis,ventral epidural abscess to cervical and lumbar spine with prolonged MRSA bacteremia, currently on dual therapy of daptomycin-ceftaroline Principal Problem:   Sepsis ([place]) Active Problems:   Hypertension   Hyperlipidemia   Asthma   Acute respiratory failure with hypoxia ([place])   Vertebral osteomyelitis ([place])   MRSA bacteremia   Retropharyngeal abscess   Right lower quadrant abdominal pain   Cervical discitis   Epidural abscess   Discitis of lumbosacral region    Subjective: Afebrile, having back pain since working with pt/ot. He as more movement/flexibility in his neck. He hasn't had BM in a week.  Medications:   Chlorhexidine Gluconate Cloth  6 each Topical Daily   docusate sodium  100 mg Oral BID   enoxaparin (LOVENOX) injection  60 mg Subcutaneous Q24H   gabapentin  300 mg Oral TID   losartan  50 mg Oral Daily   mometasone-formoterol  2 puff Inhalation BID   pantoprazole  40 mg Oral Daily   QUEtiapine  25 mg Oral QHS   sodium chloride flush  10-40 mL Intracatheter Q12H   sodium chloride flush  3 mL Intravenous Q12H    Objective: Vital signs in last 24 hours: Temp:  [98 F (36.7 C)-98.4 F (36.9 C)] 98.1 F (36.7 C) (11/29 1408) Pulse Rate:  [71-83] 75 (11/29 1409) Resp:  [16-20] 20 (11/29 1408) BP: (100-113)/(65-79) 108/76 (11/29 1409) SpO2:  [92 %-95 %] 93 % (11/29 1409) Physical Exam  Constitutional: He is oriented to person, place, and time. He appears well-developed and well-nourished. No distress.  HENT:  Mouth/Throat: Oropharynx is clear and moist. No oropharyngeal exudate.  Cardiovascular: Normal rate, regular rhythm and normal heart sounds. Exam reveals no gallop and no friction rub.  No murmur heard.  Pulmonary/Chest: Effort normal and breath sounds normal. No respiratory  distress. He has no wheezes.  Abdominal: Soft. Bowel sounds are normal. He exhibits no distension. There is no tenderness.  Lymphadenopathy:  He has no cervical adenopathy.  Neurological: He is alert and oriented to person, place, and time.  Skin: Skin is warm and dry. No rash noted. No erythema.  Psychiatric: He has a normal mood and affect. His behavior is normal.    Lab Results Recent Labs    11/16/21 0452 11/18/21 0000  WBC 5.5 4.1  HGB 9.0* 9.3*  HCT 27.2* 28.2*  NA 131* 131*  K 4.1 4.1  CL 98 97*  CO2 26 28  BUN 19 26*  CREATININE 0.79 0.97     Microbiology: 11/20 blood cx ngtd 11/13-11/18= blood cx + MRSA  Studies/Results: No results found.   Assessment/Plan: MRSA epidural abscess/cervical-lumbar discitis-osteomyelitis, NSGY did not think he needed surgery. = continue on daptomycin and ceftaroline for a plan of 14 days of dual therapy using 11/20 as day 1 then we will narrow to daptmycin alone. Plan for 8 wk of IV therapy -still needs TEE for evaluation of endocarditis  Constipation = will do more fruit in his dietary intake, recommend bisacodyl for stimulant  Pain management= per primary team. Wondering if it is contributing to constipation.  [facility] for Infectious Diseases Pager: [phone]  11/18/2021, 4:28 PM

## 2021-11-19 DIAGNOSIS — A4102 Sepsis due to Methicillin resistant Staphylococcus aureus: Secondary | ICD-10-CM | POA: Diagnosis not present

## 2021-11-19 DIAGNOSIS — G9341 Metabolic encephalopathy: Secondary | ICD-10-CM

## 2021-11-19 DIAGNOSIS — M4642 Discitis, unspecified, cervical region: Secondary | ICD-10-CM | POA: Diagnosis not present

## 2021-11-19 DIAGNOSIS — M4647 Discitis, unspecified, lumbosacral region: Secondary | ICD-10-CM | POA: Diagnosis not present

## 2021-11-19 DIAGNOSIS — M549 Dorsalgia, unspecified: Secondary | ICD-10-CM

## 2021-11-19 DIAGNOSIS — E669 Obesity, unspecified: Secondary | ICD-10-CM

## 2021-11-19 DIAGNOSIS — B9562 Methicillin resistant Staphylococcus aureus infection as the cause of diseases classified elsewhere: Secondary | ICD-10-CM | POA: Diagnosis not present

## 2021-11-19 DIAGNOSIS — R109 Unspecified abdominal pain: Secondary | ICD-10-CM

## 2021-11-19 DIAGNOSIS — R7881 Bacteremia: Secondary | ICD-10-CM | POA: Diagnosis not present

## 2021-11-19 MED ORDER — POLYETHYLENE GLYCOL 3350 17 G PO PACK
17.0000 g | PACK | Freq: Two times a day (BID) | ORAL | Status: DC
  Administered 2021-11-19 – 2021-11-24 (×9): 17 g via ORAL
  Filled 2021-11-19 (×10): qty 1

## 2021-11-19 MED ORDER — DICLOFENAC SODIUM 1 % EX GEL
2.0000 g | Freq: Four times a day (QID) | CUTANEOUS | Status: DC | PRN
  Administered 2021-11-20: 2 g via TOPICAL
  Filled 2021-11-19: qty 100

## 2021-11-19 MED ORDER — MUSCLE RUB 10-15 % EX CREA
TOPICAL_CREAM | CUTANEOUS | Status: DC | PRN
  Filled 2021-11-19: qty 85

## 2021-11-19 NOTE — Assessment & Plan Note (Addendum)
2/2 above Mobility significantly limited Continues to require IV pain meds intermittently  Follow for improvement, consider repeat imaging if worsening

## 2021-11-19 NOTE — Progress Notes (Signed)
Occupational Therapy Treatment Patient Details Name: Charles Weiss. MRN: [id number] DOB: March 08, 1951 Today's Date: 11/19/2021   History of present illness 70 yo male [facility] with neck pain, headaches x3 days, recent diagnosis of flu with admission 11/4-8. CTA head/neck shows retropharyngeal edema without visible pharyngeal or spinal source, MRI shows spine diffuse lumbar epidural enhancement without abscess; L4-5 OM changes; C5-6 prevertebral enhancement no osseous finding. workup for sepsis and mrsa along with spinal osseous involvement/discitis. PMH includes HTN, HLD, asthma, GERD.   OT comments  Patient's participation in session was limited by pain. Patient was able to tolerate sitting in chair position in bed at 45 degrees with reports of pain to complete grooming tasks. Patient unable to tolerate HOB raised any higher at this time despite education on importance of sitting upright.patient declined to trial sitting up on edge of bed on this date.  Patient's discharge plan remains appropriate at this time. OT will continue to follow acutely.     Recommendations for follow up therapy are one component of a multi-disciplinary discharge planning process, led by the attending physician.  Recommendations may be updated based on patient status, additional functional criteria and insurance authorization.    Follow Up Recommendations  Skilled nursing-short term rehab (<3 hours/day)    Assistance Recommended at Discharge Frequent or constant Supervision/Assistance  Equipment Recommendations  Other (comment) (TBD when patient's pain is under control)    Recommendations for Other Services      Precautions / Restrictions Precautions Precautions: Fall Precaution Comments: monitor O2 Restrictions Weight Bearing Restrictions: No       Mobility Bed Mobility                    Transfers                         Balance                                            ADL either performed or assessed with clinical judgement   ADL Overall ADL's : Needs assistance/impaired     Grooming: Wash/dry face;Wash/dry hands;Bed level;Set up;Oral care Grooming Details (indicate cue type and reason): patient was able to complete at bed level. with HOB raised to 45 degrees. patient declined for [facility] to be raised any higher with continued education provided on benefits.                               General ADL Comments: patient declined to participate in sitting up on edge of bed. patient participated in chair position in bed with bed to 45 degrees. patient needed slow movements for transition into upright positioning. patient declined to sit up higher than 45 degrees on this date secondary to pain.    Extremity/Trunk Assessment              Vision       Perception     Praxis      Cognition Arousal/Alertness: Awake/alert Behavior During Therapy: Flat affect Overall Cognitive Status: Impaired/Different from baseline  Exercises     Shoulder Instructions       General Comments      Pertinent Vitals/ Pain       Pain Assessment: Faces Faces Pain Scale: Hurts little more Pain Location: in back with sitting up in chair position Pain Descriptors / Indicators: Sore;Discomfort;Grimacing;Sharp;Shooting;Guarding  Home Living                                          Prior Functioning/Environment              Frequency  Min 2X/week        Progress Toward Goals  OT Goals(current goals can now be found in the care plan section)  Progress towards OT goals: Not progressing toward goals - comment (goals were reassessed on this date with one goal added to work towards. patients pain/fear of pain is impacting participation in all activity.)  Acute Rehab OT Goals OT Goal Formulation: Patient unable to participate in goal  setting Time For Goal Achievement: 12/04/21 Potential to Achieve Goals: Fair ADL Goals Pt Will Perform Lower Body Dressing: with total assist;bed level Additional ADL Goal #2: patient to participate in sitting upright for 2 mins to improve ability to engage in ADLs out of bed.  Plan Discharge plan remains appropriate    Co-evaluation                 AM-PAC OT "6 Clicks" Daily Activity     Outcome Measure   Help from another person eating meals?: None Help from another person taking care of personal grooming?: A Little Help from another person toileting, which includes using toliet, bedpan, or urinal?: A Lot Help from another person bathing (including washing, rinsing, drying)?: A Lot Help from another person to put on and taking off regular upper body clothing?: A Lot Help from another person to put on and taking off regular lower body clothing?: A Lot 6 Click Score: 15    End of Session    OT Visit Diagnosis: Unsteadiness on feet (R26.81);Other abnormalities of gait and mobility (R26.89);Muscle weakness (generalized) (M62.81);History of falling (Z91.81)   Activity Tolerance Patient limited by pain   Patient Left in bed;with call bell/phone within reach   Nurse Communication Other (comment) (pain limiting participation)        Time: 9562-1308 OT Time Calculation (min): 34 min  Charges: OT General Charges $OT Visit: 1 Visit OT Treatments $Self Care/Home Management : 23-37 mins  Jackelyn Poling OTR/L, MS Acute Rehabilitation Department Office# [phone] Pager# [phone]   Marcellina Millin 11/19/2021, 1:15 PM

## 2021-11-19 NOTE — Assessment & Plan Note (Signed)
Seems improved, aggressive bowel regimen

## 2021-11-19 NOTE — Assessment & Plan Note (Signed)
Appears resolved, on 25 mg seroquel nightly

## 2021-11-19 NOTE — Progress Notes (Signed)
PROGRESS NOTE    Charles Weiss.  GQQ:[id number] DOB: [birth year] DOA: 11/02/2021 PCP: System, Provider Not In    Chief Complaint  Patient presents with   Neck Pain   Headache    Brief Narrative: 70 yo with hx asthma, HTN, obesity who presented with neck pain and headache, found to have MRSA bacteremia.  He was initially started of vancomycin.  Imaging showed findings concerning for epidural abscess with cervical-lumbar osteo.  ID following.  Assessment & Plan:   Principal Problem:   Sepsis ([place]) Active Problems:   Hyperlipidemia   Acute respiratory failure with hypoxia (HCC)   Retropharyngeal abscess   Right lower quadrant abdominal pain   Vertebral osteomyelitis (HCC)   MRSA bacteremia   Cervical discitis   Epidural abscess   Discitis of lumbosacral region   Back pain   Abdominal pain   Acute metabolic encephalopathy   Hypertension   Asthma   Anemia   Obesity  * Sepsis ([place]) Secondary to MRSA bacteremia with discitis/vertebral osteomyelitis and epidural abscess in cervical region  MRI L spine with progressive changes of discitis/osteomyelitis at L4-5 with ventral epidural thickening and increased mass effect on thecal sac, possible changes of osteomyelitis involving R L5-S1 facet joint with posterior projecting small abscess or synovial cyst, stable endplate degenerative changes at L3-4 without definte signs of discitis or osteomyelitis  MRI C spine with progressive anterior epidural soft tissue thickening or complex fluid at C1-2 associated with new marrow edema in the C1 and C2 vertebral bodies, concerning for ostemyelitis and epidural abscess formation  Neurosurgery note from 11/23 recommending continued medical management   ENT note from 11/22 recommended CT neck to eval for retropharyngeal edema, will need eval/referral to oral surgery/DMD   Initial blood cx 11/13 with MRSA bacteremia, recurrent blood cx positive - blood cx 11/20 NGTD x5  On  daptomycin and teflaro - ID recommending 14 total days ceftaroline, then continue with daptomycin  Echo 11/14 with EF 67-12%, grade 1 diastolic dysfunction  Cardiology to see tomorrow 12/1 - at this point, sounds like probably not going to procedure with TEE tomorrow     Abdominal pain Seems improved, aggressive bowel regimen   Back pain 2/2 above  Acute metabolic encephalopathy Appears resolved, on 25 mg seroquel nightly  Hypertension Hold losartan for now  Asthma noted  Anemia trend  Obesity Body mass index is 33.61 kg/m.     DVT prophylaxis: lovenox Code Status: full  Family Communication: none at bedside Disposition:   Status is: Inpatient  Remains inpatient appropriate because: continued management of MRSA bacteremia       Consultants:  Cardiology ID ENT neurosurgery  Procedures:  Echo IMPRESSIONS     1. Left ventricular ejection fraction, by estimation, is 55 to 60%. The  left ventricle has normal function. The left ventricle has no regional  wall motion abnormalities. There is mild concentric left ventricular  hypertrophy. Left ventricular diastolic  parameters are consistent with Grade I diastolic dysfunction (impaired  relaxation).   2. Right ventricular systolic function is normal. The right ventricular  size is normal.   3. The mitral valve is normal in structure. No evidence of mitral valve  regurgitation. No evidence of mitral stenosis.   4. The aortic valve is normal in structure. Aortic valve regurgitation is  not visualized. No aortic stenosis is present.   5. There is borderline dilatation of the ascending aorta, measuring 36  mm.   6. The inferior vena cava is normal  in size with greater than 50%  respiratory variability, suggesting right atrial pressure of 3 mmHg.   Comparison(s): No prior Echocardiogram.   Antimicrobials:  Anti-infectives (From admission, onward)    Start     Dose/Rate Route Frequency Ordered Stop    11/09/21 1515  ceftaroline (TEFLARO) 600 mg in sodium chloride 0.9 % 100 mL IVPB        600 mg 100 mL/hr over 60 Minutes Intravenous Every 8 hours 11/09/21 1418     11/09/21 1230  DAPTOmycin (CUBICIN) 750 mg in sodium chloride 0.9 % IVPB        8 mg/kg  91.9 kg (Adjusted) 130 mL/hr over 30 Minutes Intravenous Daily 11/09/21 1139     11/06/21 2200  vancomycin (VANCOREADY) IVPB 1500 mg/300 mL  Status:  Discontinued        1,500 mg 150 mL/hr over 120 Minutes Intravenous Every 12 hours 11/06/21 1223 11/09/21 1128   11/03/21 1400  ceFEPIme (MAXIPIME) 2 g in sodium chloride 0.9 % 100 mL IVPB  Status:  Discontinued        2 g 200 mL/hr over 30 Minutes Intravenous Every 8 hours 11/03/21 1048 11/03/21 1119   11/03/21 1200  ampicillin (OMNIPEN) 1 g in sodium chloride 0.9 % 100 mL IVPB  Status:  Discontinued        1 g 300 mL/hr over 20 Minutes Intravenous Every 6 hours 11/03/21 1041 11/03/21 1119   11/03/21 0600  metroNIDAZOLE (FLAGYL) IVPB 500 mg  Status:  Discontinued        500 mg 100 mL/hr over 60 Minutes Intravenous Every 12 hours 11/02/21 2031 11/02/21 2248   11/03/21 0200  ceFEPIme (MAXIPIME) 2 g in sodium chloride 0.9 % 100 mL IVPB  Status:  Discontinued        2 g 200 mL/hr over 30 Minutes Intravenous Every 8 hours 11/02/21 2101 11/03/21 0445   11/03/21 0100  acyclovir (ZOVIRAX) 775 mg in dextrose 5 % 100 mL IVPB  Status:  Discontinued        10 mg/kg  77.6 kg (Ideal) 115.5 mL/hr over 60 Minutes Intravenous Every 8 hours 11/02/21 2308 11/03/21 0445   11/03/21 0000  ampicillin (OMNIPEN) 2 g in sodium chloride 0.9 % 100 mL IVPB  Status:  Discontinued        2 g 300 mL/hr over 20 Minutes Intravenous Every 4 hours 11/02/21 2308 11/03/21 0445   11/02/21 2200  vancomycin (VANCOREADY) IVPB 1000 mg/200 mL  Status:  Discontinued        1,000 mg 200 mL/hr over 60 Minutes Intravenous Every 12 hours 11/02/21 2101 11/06/21 1223   11/02/21 1700  ceFEPIme (MAXIPIME) 2 g in sodium chloride 0.9 % 100  mL IVPB        2 g 200 mL/hr over 30 Minutes Intravenous  Once 11/02/21 1646 11/02/21 1805   11/02/21 1700  metroNIDAZOLE (FLAGYL) IVPB 500 mg        500 mg 100 mL/hr over 60 Minutes Intravenous  Once 11/02/21 1646 11/02/21 2015   11/02/21 1700  vancomycin (VANCOCIN) IVPB 1000 mg/200 mL premix        1,000 mg 200 mL/hr over 60 Minutes Intravenous  Once 11/02/21 1646 11/02/21 2130          Subjective: C/o back pain  Objective: Vitals:   11/19/21 0628 11/19/21 0827 11/19/21 1326 11/19/21 1430  BP: 98/72  97/67   Pulse: 73  79   Resp: 18  18   Temp: 98.1  F (36.7 C)  98 F (36.7 C)   TempSrc:   Oral   SpO2: 92% 94% 100% 99%  Weight:      Height:        Intake/Output Summary (Last 24 hours) at 11/19/2021 2040 Last data filed at 11/19/2021 1823 Gross per 24 hour  Intake 240 ml  Output 900 ml  Net -660 ml   Filed Weights   11/02/21 0701 11/11/21 1212  Weight: 113.4 kg 112.4 kg    Examination:  General: appears somewhat uncomfortable Cardiovascular: RRR Lungs: unlabored Abdomen: Soft, nontender, nondistended  Neurological: Alert and oriented 3. Moves all extremities 4, Skin: Warm and dry. No rashes or lesions. Extremities: No clubbing or cyanosis. No edema.     Data Reviewed: I have personally reviewed following labs and imaging studies  CBC: Recent Labs  Lab 11/13/21 0325 11/14/21 0429 11/15/21 0342 11/16/21 0452 11/18/21 0000  WBC 8.2 8.1 5.0 5.5 4.1  HGB 8.9* 9.0* 8.9* 9.0* 9.3*  HCT 26.0* 26.5* 26.2* 27.2* 28.2*  MCV 90.3 90.8 92.9 91.9 93.7  PLT 275 307 294 312 389    Basic Metabolic Panel: Recent Labs  Lab 11/13/21 0325 11/14/21 0429 11/15/21 0342 11/16/21 0452 11/18/21 0000  NA 130* 130* 130* 131* 131*  K 3.9 4.6 4.0 4.1 4.1  CL 95* 96* 95* 98 97*  CO2 26 26 27 26 28   GLUCOSE 108* 124* 114* 118* 119*  BUN 14 22 15 19  26*  CREATININE 0.71 0.96 0.68 0.79 0.97  CALCIUM 8.7* 8.9 9.0 9.1 9.0  MG 1.8 2.1 1.8 1.8 2.1     GFR: Estimated Creatinine Clearance: 91.7 mL/min (by C-G formula based on SCr of 0.97 mg/dL).  Liver Function Tests: No results for input(s): AST, ALT, ALKPHOS, BILITOT, PROT, ALBUMIN in the last 168 hours.  CBG: No results for input(s): GLUCAP in the last 168 hours.   No results found for this or any previous visit (from the past 240 hour(s)).       Radiology Studies: No results found.      Scheduled Meds:  albuterol  2.5 mg Nebulization TID   bisacodyl  10 mg Oral Q1500   Chlorhexidine Gluconate Cloth  6 each Topical Daily   docusate sodium  100 mg Oral BID   enoxaparin (LOVENOX) injection  60 mg Subcutaneous Q24H   gabapentin  300 mg Oral TID   losartan  50 mg Oral Daily   mometasone-formoterol  2 puff Inhalation BID   pantoprazole  40 mg Oral Daily   polyethylene glycol  17 g Oral BID   QUEtiapine  25 mg Oral QHS   sodium chloride flush  10-40 mL Intracatheter Q12H   sodium chloride flush  3 mL Intravenous Q12H   Continuous Infusions:  sodium chloride 10 mL/hr at 11/03/21 1032   ceFTAROline (TEFLARO) IV 600 mg (11/19/21 1507)   DAPTOmycin (CUBICIN)  IV 750 mg (11/19/21 1958)     LOS: 17 days    Time spent: over 43 min    Fayrene Helper, MD [facility]   To contact the attending provider between 7A-7P or the covering provider during after hours 7P-7A, please log into the web site [email or web address] and access using universal [place] password for that web site. If you do not have the password, please call the hospital operator.  11/19/2021, 8:40 PM

## 2021-11-19 NOTE — Assessment & Plan Note (Signed)
noted 

## 2021-11-19 NOTE — Assessment & Plan Note (Signed)
trend

## 2021-11-19 NOTE — Assessment & Plan Note (Addendum)
Secondary to MRSA bacteremia with discitis/vertebral osteomyelitis and epidural abscess in cervical region  MRI L spine with progressive changes of discitis/osteomyelitis at L4-5 with ventral epidural thickening and increased mass effect on thecal sac, possible changes of osteomyelitis involving R L5-S1 facet joint with posterior projecting small abscess or synovial cyst, stable endplate degenerative changes at L3-4 without definte signs of discitis or osteomyelitis  MRI C spine with progressive anterior epidural soft tissue thickening or complex fluid at C1-2 associated with new marrow edema in the C1 and C2 vertebral bodies, concerning for ostemyelitis and epidural abscess formation  Neurosurgery note from 11/23 recommending continued medical management   ENT note from 11/22 recommended CT neck to eval for retropharyngeal edema, will need eval/referral to oral surgery/DMD   Initial blood cx 11/13 with MRSA bacteremia, recurrent blood cx positive - blood cx 11/20 NGTD x5  On daptomycin and teflaro - ID recommending 14 total days ceftaroline, then continue with daptomycin (now vanc as below - see pharmacy note 12/2)  Echo 11/14 with EF 58-85%, grade 1 diastolic dysfunction  Per cards, high risk for TEE -> consider treating medically with IV abx; medical treatment outpatient TEE once near completion of therapy; treat medically, gated CT to eval for abscesses or large vegetation --- cardiology recommending medical management at this time   Planning to reconsider TEE at end of therapy per ID  Transitioned to vanc at [facility] for ease for SNF pharmacy

## 2021-11-19 NOTE — Progress Notes (Signed)
    [facility] for Infectious Disease    Date of Admission:  11/02/2021   Total days of antibiotics   ID: [name] Masahiro Iglesia. is a 70 y.o. male with MRSA bacteremia, cervical-lumbar osteomyelitis with ventral epidural abscess. Principal Problem:   Sepsis ([place]) Active Problems:   Hypertension   Hyperlipidemia   Asthma   Acute respiratory failure with hypoxia (HCC)   Vertebral osteomyelitis (HCC)   MRSA bacteremia   Retropharyngeal abscess   Right lower quadrant abdominal pain   Cervical discitis   Epidural abscess   Discitis of lumbosacral region  Subjective: afebrile, still having pain from last night  Medications:   albuterol  2.5 mg Nebulization TID   bisacodyl  10 mg Oral Q1500   Chlorhexidine Gluconate Cloth  6 each Topical Daily   docusate sodium  100 mg Oral BID   enoxaparin (LOVENOX) injection  60 mg Subcutaneous Q24H   gabapentin  300 mg Oral TID   losartan  50 mg Oral Daily   mometasone-formoterol  2 puff Inhalation BID   pantoprazole  40 mg Oral Daily   polyethylene glycol  17 g Oral Daily   QUEtiapine  25 mg Oral QHS   sodium chloride flush  10-40 mL Intracatheter Q12H   sodium chloride flush  3 mL Intravenous Q12H    Objective: Vital signs in last 24 hours: Temp:  [97.4 F (36.3 C)-98.1 F (36.7 C)] 98 F (36.7 C) (11/30 1326) Pulse Rate:  [72-79] 79 (11/30 1326) Resp:  [18-19] 18 (11/30 1326) BP: (97-104)/(67-72) 97/67 (11/30 1326) SpO2:  [90 %-100 %] 99 % (11/30 1430) Physical Exam  Constitutional: He is oriented to person, place, and time. He appears well-developed and well-nourished. No distress.  HENT:  Mouth/Throat: Oropharynx is clear and moist. No oropharyngeal exudate.  Cardiovascular: Normal rate, regular rhythm and normal heart sounds. Exam reveals no gallop and no friction rub.  No murmur heard.  Pulmonary/Chest: Effort normal and breath sounds normal. No respiratory distress. He has no wheezes.  Abdominal: Soft. Bowel sounds  are normal. He exhibits no distension. There is no tenderness.  Lymphadenopathy:  He has no cervical adenopathy.  Neurological: He is alert and oriented to person, place, and time.  Skin: Skin is warm and dry. No rash noted. No erythema.  Psychiatric: He has a normal mood and affect. His behavior is normal.    Lab Results Recent Labs    11/18/21 0000  WBC 4.1  HGB 9.3*  HCT 28.2*  NA 131*  K 4.1  CL 97*  CO2 28  BUN 26*  CREATININE 0.97   Lab Results  Component Value Date   ESRSEDRATE 95 (H) 11/02/2021    Microbiology: 11/20 blood cx ngtd Studies/Results: No results found.   Assessment/Plan: MRSA bacteremia with epidural abscess- with cervical-lumbar osteomyelitis = continue on daptomycin and ceftaroline. Day 11 of ceftaroline. We will plan to do 14 days of ceftaroline and continue with daptomycin which will be 8 wk (using 11/20 as day 1)  - has TEE tomorrow to evaluate for endocarditis  - NSGY did not feel he was a surgical candidate, no cord compression though has numerous areas of involvement in spine  Back pain = appears to be limiting factor to his mobility. Ideally would like to have him be able to participate with pt for possibly CIR.  [facility] Pager: [phone]  11/19/2021, 5:37 PM

## 2021-11-19 NOTE — Assessment & Plan Note (Signed)
Body mass index is 33.61 kg/m.

## 2021-11-19 NOTE — Assessment & Plan Note (Signed)
- 

## 2021-11-19 NOTE — Progress Notes (Signed)
    OVERNIGHT PROGRESS REPORT  Notified by RN for Patient expressing the desire to be a FULL Code and to remove the DNR.  Notified NCPOA (his Sister) Lurline Hare. On phone Patient told Gwynn of his wish to be FULL Code and she acknowledged his desire to be FULL Code to 2 RNs and NP.  As of the time of this note Patient is FULL Code.   Gershon Cull MSNA MSN ACNPC-AG Acute Care Nurse Practitioner [place]

## 2021-11-19 NOTE — Progress Notes (Addendum)
    [facility] has been requested to perform a transesophageal echocardiogram on this patient for bacteremia. Dr. Radford Pax originally deferred last week due to retropharyngeal edema and recommended ENT evaluation. Per ENT consult 11/22, "I recommend a CT neck with contrast to reevaluate for retropharyngeal edema, as per cardiology request.  If imaging is unremarkable, can proceed with TEE as planned." CT soft tissue neck 11/12/21 showed "Prevertebral soft tissue swelling. Progressive epidural thickening at C1 and C2 with central low density suggesting epidural abscess. This is causing cord flattening and mild spinal stenosis. Recommend repeat MRI cervical spine without and with contrast. The prior MRI was degraded by significant motion. Recommend appropriate sedation prior to MRI." MRI C spine 11/12/21 showed "1. As seen on neck CT earlier today, there is progressive anterior epidural soft tissue thickening or complex fluid at C1-2 associated with new marrow edema in the C1 and C2 vertebral bodies, suspicious for osteomyelitis and epidural abscess formation. The prevertebral/retropharyngeal soft tissue swelling is similar to the previous MRI of 9 days ago."  I have reached out to Dr. Fredric Dine via secure chat to confirm that ENT has given the all-clear to proceed with TEE. Tentatively this was scheduled tomorrow at 10am at [facility] with Dr. Acie Fredrickson. After careful review of history and examination, the risks and benefits of transesophageal echocardiogram have been explained including risks of esophageal damage, perforation (1:10,000 risk), bleeding, pharyngeal hematoma as well as other potential complications associated with conscious sedation including aspiration, arrhythmia, respiratory failure and death. Alternatives to treatment were discussed, questions were answered. Patient is A+Ox3 and willing to proceed if that is what is recommended, but he does share concern that he would request not to be rolled  during the procedure due to chronic back issues.  We will await feedback from ENT whether OK to proceed and I will discuss with cardiology MD regarding positioning.  ADDENDUM: d/w Dr. Fredric Dine. She reports she had recommended neurosurgery evaluation based on findings above last week and neurosurgery team recommended conservative management. The edema is felt likely related to the presence of the abscess. She indicates she cannot say at this time what the specific risk would be with the presence of retropharyngeal edema while doing the TEE since she herself does not perform this procedure. She indicates there is likely some risk involved, that the benefit of the procedure may outweigh the risk based on clinical scenario, but she cannot say definitively that there is no risk. I reviewed with Dr. Radford Pax the above information regarding the retropharyngeal edema and the patient's concerns about positioning and she does not feel it is safe to proceed with TEE at this time. Related this to Dr. Florene Glen - cardiology team can see in consult tomorrow to review alternate imaging modalities / establish care to re-eval plan for TEE as OP.  Charlie Pitter, PA-C 11/19/2021 10:35 AM

## 2021-11-20 ENCOUNTER — Encounter (HOSPITAL_COMMUNITY)
Admission: EM | Disposition: A | Discharge status: Skilled Nursing Facility | Payer: Self-pay | Source: Home / Self Care | Attending: Internal Medicine

## 2021-11-20 DIAGNOSIS — G062 Extradural and subdural abscess, unspecified: Secondary | ICD-10-CM | POA: Diagnosis not present

## 2021-11-20 DIAGNOSIS — M4647 Discitis, unspecified, lumbosacral region: Secondary | ICD-10-CM | POA: Diagnosis not present

## 2021-11-20 DIAGNOSIS — M4642 Discitis, unspecified, cervical region: Secondary | ICD-10-CM | POA: Diagnosis not present

## 2021-11-20 DIAGNOSIS — J390 Retropharyngeal and parapharyngeal abscess: Secondary | ICD-10-CM | POA: Diagnosis not present

## 2021-11-20 DIAGNOSIS — R7881 Bacteremia: Secondary | ICD-10-CM | POA: Diagnosis not present

## 2021-11-20 DIAGNOSIS — A419 Sepsis, unspecified organism: Secondary | ICD-10-CM | POA: Diagnosis not present

## 2021-11-20 DIAGNOSIS — M4620 Osteomyelitis of vertebra, site unspecified: Secondary | ICD-10-CM | POA: Diagnosis not present

## 2021-11-20 DIAGNOSIS — A4102 Sepsis due to Methicillin resistant Staphylococcus aureus: Secondary | ICD-10-CM | POA: Diagnosis not present

## 2021-11-20 LAB — COMPREHENSIVE METABOLIC PANEL
ALT: 40 U/L (ref 0–44)
AST: 24 U/L (ref 15–41)
Albumin: 2.3 g/dL — ABNORMAL LOW (ref 3.5–5.0)
Alkaline Phosphatase: 69 U/L (ref 38–126)
Anion gap: 4 — ABNORMAL LOW (ref 5–15)
BUN: 16 mg/dL (ref 8–23)
CO2: 28 mmol/L (ref 22–32)
Calcium: 8.6 mg/dL — ABNORMAL LOW (ref 8.9–10.3)
Chloride: 97 mmol/L — ABNORMAL LOW (ref 98–111)
Creatinine, Ser: 0.74 mg/dL (ref 0.61–1.24)
GFR, Estimated: >60 mL/min (ref >60)
Glucose, Bld: 114 mg/dL — ABNORMAL HIGH (ref 70–99)
Potassium: 4 mmol/L (ref 3.5–5.1)
Sodium: 129 mmol/L — ABNORMAL LOW (ref 135–145)
Total Bilirubin: 0.5 mg/dL (ref 0.3–1.2)
Total Protein: 7.3 g/dL (ref 6.5–8.1)

## 2021-11-20 LAB — CBC WITH DIFFERENTIAL/PLATELET
Abs Immature Granulocytes: 0.01 10*3/uL (ref 0.00–0.07)
Basophils Absolute: 0 10*3/uL (ref 0.0–0.1)
Basophils Relative: 1 %
Eosinophils Absolute: 0.1 10*3/uL (ref 0.0–0.5)
Eosinophils Relative: 2 %
HCT: 27.2 % — ABNORMAL LOW (ref 39.0–52.0)
Hemoglobin: 9 g/dL — ABNORMAL LOW (ref 13.0–17.0)
Immature Granulocytes: 0 %
Lymphocytes Relative: 16 %
Lymphs Abs: 0.6 10*3/uL — ABNORMAL LOW (ref 0.7–4.0)
MCH: 30.7 pg (ref 26.0–34.0)
MCHC: 33.1 g/dL (ref 30.0–36.0)
MCV: 92.8 fL (ref 80.0–100.0)
Monocytes Absolute: 0.6 10*3/uL (ref 0.1–1.0)
Monocytes Relative: 16 %
Neutro Abs: 2.5 10*3/uL (ref 1.7–7.7)
Neutrophils Relative %: 65 %
Platelets: 264 10*3/uL (ref 150–400)
RBC: 2.93 MIL/uL — ABNORMAL LOW (ref 4.22–5.81)
RDW: 14.4 % (ref 11.5–15.5)
WBC: 3.8 10*3/uL — ABNORMAL LOW (ref 4.0–10.5)
nRBC: 0 % (ref 0.0–0.2)

## 2021-11-20 LAB — PHOSPHORUS: Phosphorus: 3.9 mg/dL (ref 2.5–4.6)

## 2021-11-20 LAB — MAGNESIUM: Magnesium: 1.7 mg/dL (ref 1.7–2.4)

## 2021-11-20 SURGERY — ECHOCARDIOGRAM, TRANSESOPHAGEAL
Anesthesia: Monitor Anesthesia Care

## 2021-11-20 NOTE — Progress Notes (Signed)
PROGRESS NOTE    Charles Weiss.  BDZ:[id number] DOB: 03/29/1951 DOA: 11/02/2021 PCP: System, Provider Not In    Chief Complaint  Patient presents with   Neck Pain   Headache    Brief Narrative: 70 yo with hx asthma, HTN, obesity who presented with neck pain and headache, found to have MRSA bacteremia.  He was initially started of vancomycin.  Imaging showed findings concerning for epidural abscess with cervical-lumbar osteo.  ID following.  Assessment & Plan:   Principal Problem:   Sepsis ([place]) Active Problems:   Hyperlipidemia   Acute respiratory failure with hypoxia (HCC)   Retropharyngeal abscess   Right lower quadrant abdominal pain   Vertebral osteomyelitis (HCC)   MRSA bacteremia   Cervical discitis   Epidural abscess   Discitis of lumbosacral region   Back pain   Abdominal pain   Acute metabolic encephalopathy   Hypertension   Asthma   Anemia   Hyponatremia   Obesity  * Sepsis ([place]) Secondary to MRSA bacteremia with discitis/vertebral osteomyelitis and epidural abscess in cervical region  MRI L spine with progressive changes of discitis/osteomyelitis at L4-5 with ventral epidural thickening and increased mass effect on thecal sac, possible changes of osteomyelitis involving R L5-S1 facet joint with posterior projecting small abscess or synovial cyst, stable endplate degenerative changes at L3-4 without definte signs of discitis or osteomyelitis  MRI C spine with progressive anterior epidural soft tissue thickening or complex fluid at C1-2 associated with new marrow edema in the C1 and C2 vertebral bodies, concerning for ostemyelitis and epidural abscess formation  Neurosurgery note from 11/23 recommending continued medical management   ENT note from 11/22 recommended CT neck to eval for retropharyngeal edema, will need eval/referral to oral surgery/DMD   Initial blood cx 11/13 with MRSA bacteremia, recurrent blood cx positive - blood cx 11/20 NGTD  x5  On daptomycin and teflaro - ID recommending 14 total days ceftaroline, then continue with daptomycin  Echo 11/14 with EF 34-19%, grade 1 diastolic dysfunction  Per cards, high risk for TEE -> consider treating medically with IV abx; medical treatment outpatient TEE once near completion of therapy; treat medically, gated CT to eval for abscesses or large vegetation --- cardiology recommending medical management at this time --- will discuss with ID   Abdominal pain Seems improved, aggressive bowel regimen   Back pain 2/2 above Mobility significantly limited  Acute metabolic encephalopathy Appears resolved, on 25 mg seroquel nightly  Hypertension Hold losartan for now  Asthma noted  Hyponatremia Mild, continue to monitor  Anemia trend  Obesity Body mass index is 33.61 kg/m.     DVT prophylaxis: lovenox Code Status: full  Family Communication: none at bedside Disposition:   Status is: Inpatient  Remains inpatient appropriate because: continued management of MRSA bacteremia       Consultants:  Cardiology ID ENT neurosurgery  Procedures:  Echo IMPRESSIONS     1. Left ventricular ejection fraction, by estimation, is 55 to 60%. The  left ventricle has normal function. The left ventricle has no regional  wall motion abnormalities. There is mild concentric left ventricular  hypertrophy. Left ventricular diastolic  parameters are consistent with Grade I diastolic dysfunction (impaired  relaxation).   2. Right ventricular systolic function is normal. The right ventricular  size is normal.   3. The mitral valve is normal in structure. No evidence of mitral valve  regurgitation. No evidence of mitral stenosis.   4. The aortic valve is normal in  structure. Aortic valve regurgitation is  not visualized. No aortic stenosis is present.   5. There is borderline dilatation of the ascending aorta, measuring 36  mm.   6. The inferior vena cava is normal in  size with greater than 50%  respiratory variability, suggesting right atrial pressure of 3 mmHg.   Comparison(s): No prior Echocardiogram.   Antimicrobials:  Anti-infectives (From admission, onward)    Start     Dose/Rate Route Frequency Ordered Stop   11/09/21 1515  ceftaroline (TEFLARO) 600 mg in sodium chloride 0.9 % 100 mL IVPB        600 mg 100 mL/hr over 60 Minutes Intravenous Every 8 hours 11/09/21 1418 11/23/21 1000   11/09/21 1230  DAPTOmycin (CUBICIN) 750 mg in sodium chloride 0.9 % IVPB        8 mg/kg  91.9 kg (Adjusted) 130 mL/hr over 30 Minutes Intravenous Daily 11/09/21 1139     11/06/21 2200  vancomycin (VANCOREADY) IVPB 1500 mg/300 mL  Status:  Discontinued        1,500 mg 150 mL/hr over 120 Minutes Intravenous Every 12 hours 11/06/21 1223 11/09/21 1128   11/03/21 1400  ceFEPIme (MAXIPIME) 2 g in sodium chloride 0.9 % 100 mL IVPB  Status:  Discontinued        2 g 200 mL/hr over 30 Minutes Intravenous Every 8 hours 11/03/21 1048 11/03/21 1119   11/03/21 1200  ampicillin (OMNIPEN) 1 g in sodium chloride 0.9 % 100 mL IVPB  Status:  Discontinued        1 g 300 mL/hr over 20 Minutes Intravenous Every 6 hours 11/03/21 1041 11/03/21 1119   11/03/21 0600  metroNIDAZOLE (FLAGYL) IVPB 500 mg  Status:  Discontinued        500 mg 100 mL/hr over 60 Minutes Intravenous Every 12 hours 11/02/21 2031 11/02/21 2248   11/03/21 0200  ceFEPIme (MAXIPIME) 2 g in sodium chloride 0.9 % 100 mL IVPB  Status:  Discontinued        2 g 200 mL/hr over 30 Minutes Intravenous Every 8 hours 11/02/21 2101 11/03/21 0445   11/03/21 0100  acyclovir (ZOVIRAX) 775 mg in dextrose 5 % 100 mL IVPB  Status:  Discontinued        10 mg/kg  77.6 kg (Ideal) 115.5 mL/hr over 60 Minutes Intravenous Every 8 hours 11/02/21 2308 11/03/21 0445   11/03/21 0000  ampicillin (OMNIPEN) 2 g in sodium chloride 0.9 % 100 mL IVPB  Status:  Discontinued        2 g 300 mL/hr over 20 Minutes Intravenous Every 4 hours 11/02/21  2308 11/03/21 0445   11/02/21 2200  vancomycin (VANCOREADY) IVPB 1000 mg/200 mL  Status:  Discontinued        1,000 mg 200 mL/hr over 60 Minutes Intravenous Every 12 hours 11/02/21 2101 11/06/21 1223   11/02/21 1700  ceFEPIme (MAXIPIME) 2 g in sodium chloride 0.9 % 100 mL IVPB        2 g 200 mL/hr over 30 Minutes Intravenous  Once 11/02/21 1646 11/02/21 1805   11/02/21 1700  metroNIDAZOLE (FLAGYL) IVPB 500 mg        500 mg 100 mL/hr over 60 Minutes Intravenous  Once 11/02/21 1646 11/02/21 2015   11/02/21 1700  vancomycin (VANCOCIN) IVPB 1000 mg/200 mL premix        1,000 mg 200 mL/hr over 60 Minutes Intravenous  Once 11/02/21 1646 11/02/21 2130          Subjective:  No new complaints, pain with movement  Objective: Vitals:   11/19/21 2137 11/20/21 0546 11/20/21 0820 11/20/21 1300  BP: 120/75 111/79  130/71  Pulse: 76 76  83  Resp: 18 18  19   Temp: 98.8 F (37.1 C) 98 F (36.7 C)  98.9 F (37.2 C)  TempSrc: Oral Oral  Oral  SpO2: 90% 95% 97% 96%  Weight:      Height:        Intake/Output Summary (Last 24 hours) at 11/20/2021 1519 Last data filed at 11/20/2021 1313 Gross per 24 hour  Intake 600 ml  Output 1200 ml  Net -600 ml   Filed Weights   11/02/21 0701 11/11/21 1212  Weight: 113.4 kg 112.4 kg    Examination:  General: No acute distress. Cardiovascular: RRR Lungs: unlabored Abdomen: Soft, nontender, nondistended Neurological: Alert and oriented 3. Moves all extremities 4 . Cranial nerves II through XII grossly intact. Skin: Warm and dry. No rashes or lesions. Extremities: No clubbing or cyanosis. No edema.   Data Reviewed: I have personally reviewed following labs and imaging studies  CBC: Recent Labs  Lab 11/14/21 0429 11/15/21 0342 11/16/21 0452 11/18/21 0000 11/20/21 0420  WBC 8.1 5.0 5.5 4.1 3.8*  NEUTROABS  --   --   --   --  2.5  HGB 9.0* 8.9* 9.0* 9.3* 9.0*  HCT 26.5* 26.2* 27.2* 28.2* 27.2*  MCV 90.8 92.9 91.9 93.7 92.8  PLT 307 294  312 286 570    Basic Metabolic Panel: Recent Labs  Lab 11/14/21 0429 11/15/21 0342 11/16/21 0452 11/18/21 0000 11/20/21 0420  NA 130* 130* 131* 131* 129*  K 4.6 4.0 4.1 4.1 4.0  CL 96* 95* 98 97* 97*  CO2 26 27 26 28 28   GLUCOSE 124* 114* 118* 119* 114*  BUN 22 15 19  26* 16  CREATININE 0.96 0.68 0.79 0.97 0.74  CALCIUM 8.9 9.0 9.1 9.0 8.6*  MG 2.1 1.8 1.8 2.1 1.7  PHOS  --   --   --   --  3.9    GFR: Estimated Creatinine Clearance: 111.2 mL/min (by C-G formula based on SCr of 0.74 mg/dL).  Liver Function Tests: Recent Labs  Lab 11/20/21 0420  AST 24  ALT 40  ALKPHOS 69  BILITOT 0.5  PROT 7.3  ALBUMIN 2.3*    CBG: No results for input(s): GLUCAP in the last 168 hours.   No results found for this or any previous visit (from the past 240 hour(s)).       Radiology Studies: No results found.      Scheduled Meds:  albuterol  2.5 mg Nebulization TID   bisacodyl  10 mg Oral Q1500   Chlorhexidine Gluconate Cloth  6 each Topical Daily   docusate sodium  100 mg Oral BID   enoxaparin (LOVENOX) injection  60 mg Subcutaneous Q24H   gabapentin  300 mg Oral TID   mometasone-formoterol  2 puff Inhalation BID   pantoprazole  40 mg Oral Daily   polyethylene glycol  17 g Oral BID   QUEtiapine  25 mg Oral QHS   sodium chloride flush  10-40 mL Intracatheter Q12H   sodium chloride flush  3 mL Intravenous Q12H   Continuous Infusions:  sodium chloride 10 mL/hr at 11/03/21 1032   ceFTAROline (TEFLARO) IV 600 mg (11/20/21 0647)   DAPTOmycin (CUBICIN)  IV 750 mg (11/19/21 1958)     LOS: 18 days    Time spent: over 30 min  Fayrene Helper, MD [facility]   To contact the attending provider between 7A-7P or the covering provider during after hours 7P-7A, please log into the web site [email or web address] and access using universal [place] password for that web site. If you do not have the password, please call the hospital operator.  11/20/2021, 3:19 PM

## 2021-11-20 NOTE — Assessment & Plan Note (Addendum)
Mild, continue to monitor - ? Pain related

## 2021-11-20 NOTE — Consult Note (Signed)
Cardiology Consultation:   Patient ID: Charles Weiss. MRN: [id number]; DOB: 1951/05/12  Admit date: 11/02/2021 Date of Consult: 11/20/2021  PCP:  System, Provider Not In   [place] Providers Cardiologist:  New to Dr. Lafonda Mosses here to update MD or APP on Care Team, Refresh:1}     Patient Profile:   Charles Weiss. is a 70 y.o. male with a hx of asthma, HTN, HLD, chronic back issues, prostate CA, GERD, recent admission for acute hypoxic respiratory failure due to influenza PNA who is being seen 11/20/2021 for the evaluation of endocarditis evaluation at the request of Dr. Florene Glen due to potential deferral of TEE.  History of Present Illness:   Charles Weiss denies prior cardiac history aside from a cardiac cath in his 40s which he reports was unremarkable. He was admitted recently 11/4-11/8 with influenza A PNA. He continued to feel unwell after discharge and returned to the ED 11/02/21 with headache, neck pain, back pain and malaise. He was found to have MRSA bacteremia. Imaging showed findings concerning for epidural abscess with cervical-lumbar osteomyelitis. Other issues include abdominal pain/constipation and acute metabolic encephalopathy, treated with Seroquel.   Cardiology was consulted last week for TEE. However, there was presence of the finding of retropharyngeal edema. Dr. Radford Pax had recommended to defer the TEE until seen by ENT. Per ENT consult 11/22, "I recommend a CT neck with contrast to reevaluate for retropharyngeal edema, as per cardiology request.  If imaging is unremarkable, can proceed with TEE as planned." CT soft tissue neck 11/12/21 showed "Prevertebral soft tissue swelling. Progressive epidural thickening at C1 and C2 with central low density suggesting epidural abscess. This is causing cord flattening and mild spinal stenosis. Recommend repeat MRI cervical spine without and with contrast. The prior MRI was degraded by significant  motion. Recommend appropriate sedation prior to MRI." MRI C spine 11/12/21 showed "1. As seen on neck CT earlier today, there is progressive anterior epidural soft tissue thickening or complex fluid at C1-2 associated with new marrow edema in the C1 and C2 vertebral bodies, suspicious for osteomyelitis and epidural abscess formation. The prevertebral/retropharyngeal soft tissue swelling is similar to the previous MRI of 9 days ago." We were asked yesterday to revisit timing of TEE. I reviewed with Dr. Fredric Dine who indicated she had recommended neurosurgery evaluation based on findings above last week. Neurosurgery subsequently recommended conservative management. The edema was felt likely related to the presence of the abscess. ENT MD indicated that she could not definitively speak to the specific risk while doing the TEE since she herself does not perform this procedure. She indicated there is likely some risk involved, that the benefit of the procedure may outweigh the risk based on clinical scenario, but that she could not definitively say there was no risk. The patient also had relayed concerns about being positioned/rolled during the procedure due to his back problems. I reviewed again back with Dr. Radford Pax who felt TEE was not appropriate at this time. Cardiology is seeing to assess timing of TEE vs alternative imaging modalities. Surface echo 11/03/21 showed EF 55-60%, mild LVH, grade 1 DD, no significant valvular disease. Other issues noted include hyponatremia, hypoalbuminemia, normocytic anemia (consistently in 8-9 range recently). Losartan discontinued due to hypotension, BP improved this AM. Denies any acute complaints except various orthopedic pains. No CP or dyspnea.   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 No pertinent surgical history.   Home Medications:  Prior to Admission medications   Medication Sig Start Date End Date Taking? Authorizing Provider  acetaminophen (TYLENOL) 650 MG CR tablet Take 650 mg by mouth every 8 (eight) hours as needed for pain.    [provider]  albuterol (PROVENTIL) (2.5 MG/3ML) 0.083% nebulizer solution Take 3 mLs by nebulization 3 (three) times daily. 10/28/21   Lavina Hamman, MD  amLODipine (NORVASC) 5 MG tablet Take 5 mg by mouth daily.    [provider]  aspirin 81 MG chewable tablet Chew by mouth daily.    [provider]  benzonatate (TESSALON) 100 MG capsule Take 1 capsule (100 mg total) by mouth 3 (three) times daily. 10/28/21   Lavina Hamman, MD  chlorpheniramine-HYDROcodone (TUSSIONEX) 10-8 MG/5ML SUER Take 5 mLs by mouth every 12 (twelve) hours as needed for cough. 10/28/21   Lavina Hamman, MD  Cholecalciferol (VITAMIN D3) 25 MCG (1000 UT) CAPS Take 1,000 Units by mouth daily.    [provider]  Cyanocobalamin (B-12 PO) Take 1 tablet by mouth daily.    [provider]  dextromethorphan (DELSYM) 30 MG/5ML liquid Take 5 mLs (30 mg total) by mouth 2 (two) times daily as needed for cough. 10/28/21   Lavina Hamman, MD  dextromethorphan-guaiFENesin [facility] DM) 30-600 MG 12hr tablet Take 1 tablet by mouth 2 (two) times daily. 10/28/21   Lavina Hamman, MD  docusate sodium (COLACE) 100 MG capsule Take 1 capsule (100 mg total) by mouth 2 (two) times daily. 10/28/21 10/28/22  Lavina Hamman, MD  gabapentin (NEURONTIN) 300 MG capsule Take 1 tablet (300 mg) in the morning and 2 tablets (600 mg) before bed. Patient taking differently: Take 300-600 mg by mouth See admin instructions. Take 300 mg in the morning and afternoon and 600 mg before bed. 08/01/21   Raspet, Erin K, PA-C  hydrochlorothiazide (HYDRODIURIL) 12.5 MG tablet Take 12.5 mg by mouth daily.    [provider]  indomethacin (INDOCIN SR) 75 MG CR capsule Take 75 mg by mouth 2 (two) times daily with a meal.    [provider]  ketotifen (ZADITOR) 0.025 % ophthalmic solution Place 1 drop into both eyes 2 (two) times daily as needed (allergies).    [provider]  losartan (COZAAR) 50 MG tablet Take 50 mg by mouth daily.    [provider]  omeprazole (PRILOSEC) 20 MG capsule Take 20 mg by mouth 2 (two) times daily before a meal.    [provider]  polyethylene glycol (MIRALAX / GLYCOLAX) 17 g packet Take 17 g by mouth daily. 10/29/21   Lavina Hamman, MD  predniSONE (DELTASONE) 10 MG tablet Take 50mg  daily for 3days,Take 40mg  daily for 3days,Take 30mg  daily for 3days,Take 20mg  daily for 3days,Take 10mg  daily for 3days, then stop 10/28/21   Lavina Hamman, MD  Pyridoxine HCl (B-6 PO) Take 1 tablet by mouth daily.    [provider]  simvastatin (ZOCOR) 40 MG tablet Take 40 mg by mouth daily.    [provider]  tizanidine (ZANAFLEX) 2 MG capsule Take 1 capsule (2 mg total) by mouth at bedtime as needed for muscle spasms. Patient not taking: No sig reported 08/01/21  Raspet, Erin K, PA-C  WIXELA INHUB 250-50 MCG/ACT AEPB Inhale 1 puff into the lungs 2 (two) times daily. 10/09/21   [provider]    Inpatient Medications: Scheduled Meds:  albuterol  2.5 mg Nebulization TID   bisacodyl  10 mg Oral Q1500   Chlorhexidine Gluconate Cloth  6 each Topical Daily   docusate sodium  100 mg Oral BID   enoxaparin (LOVENOX) injection  60 mg Subcutaneous Q24H   gabapentin  300 mg Oral TID   mometasone-formoterol  2 puff Inhalation BID   pantoprazole  40 mg Oral Daily   polyethylene glycol  17 g Oral BID   QUEtiapine  25 mg Oral QHS   sodium chloride flush  10-40 mL Intracatheter Q12H   sodium chloride flush  3 mL Intravenous Q12H   Continuous Infusions:  sodium chloride 10 mL/hr at 11/03/21 1032   ceFTAROline (TEFLARO) IV 600 mg  (11/20/21 0647)   DAPTOmycin (CUBICIN)  IV 750 mg (11/19/21 1958)   PRN Meds: sodium chloride, acetaminophen **OR** acetaminophen, diclofenac Sodium, guaiFENesin, haloperidol lactate, hydrALAZINE, ipratropium-albuterol, lip balm, metoprolol tartrate, morphine injection, Muscle Rub, naLOXone (NARCAN)  injection, ondansetron **OR** ondansetron (ZOFRAN) IV, oxyCODONE, senna-docusate, sodium chloride flush, traZODone  Allergies:    Allergies  Allergen Reactions   Elavil [Amitriptyline] Other (See Comments)   Tetracyclines & Related Other (See Comments)    Social History:   Social History   Socioeconomic History   Marital status: Divorced    Spouse name: Not on file   Number of children: Not on file   Years of education: Not on file   Highest education level: Not on file  Occupational History   Not on file  Tobacco Use   Smoking status: Every Day    Types: Cigarettes   Smokeless tobacco: Never  Vaping Use   Vaping Use: Never used  Substance and Sexual Activity   Alcohol use: Never   Drug use: Never   Sexual activity: Not on file  Other Topics Concern   Not on file  Social History Narrative   Not on file   Social Determinants of Health   Financial Resource Strain: Not on file  Food Insecurity: Not on file  Transportation Needs: Not on file  Physical Activity: Not on file  Stress: Not on file  Social Connections: Not on file  Intimate Partner Violence: Not on file    Family History:    Family History  Problem Relation Age of Onset   Hypertension Mother    Skin cancer Mother    Osteoporosis Mother    Hypertension Father      ROS:  Please see the history of present illness.   All other ROS reviewed and negative.     Physical Exam/Data:   Vitals:   11/19/21 1326 11/19/21 1430 11/19/21 2137 11/20/21 0546  BP: 97/67  120/75 111/79  Pulse: 79  76 76  Resp: 18  18 18   Temp: 98 F (36.7 C)  98.8 F (37.1 C) 98 F (36.7 C)  TempSrc: Oral  Oral Oral  SpO2:  100% 99% 90% 95%  Weight:      Height:        Intake/Output Summary (Last 24 hours) at 11/20/2021 0717 Last data filed at 11/20/2021 0500 Gross per 24 hour  Intake 240 ml  Output 850 ml  Net -610 ml   Last 3 Weights 11/11/2021 11/02/2021 10/24/2021  Weight (lbs) 247 lb 12.8 oz 250 lb 250 lb  Weight (kg) 112.4  kg 113.399 kg 113.399 kg     Body mass index is 33.61 kg/m.  General: Well developed, well nourished WM in no acute distress. Head: Normocephalic, atraumatic, sclera non-icteric, no xanthomas, nares are without discharge. Neck: Negative for carotid bruits. JVP not elevated. Lungs: Clear bilaterally to auscultation without wheezes, rales, or rhonchi. Breathing is unlabored. Heart: RRR S1 S2 without murmurs, rubs, or gallops.  Abdomen: Soft, non-tender, non-distended with normoactive bowel sounds. No rebound/guarding. Extremities: No clubbing or cyanosis. No edema. Distal pedal pulses are 2+ and equal bilaterally. No [place] lesions or [place] notes. Neuro: Alert and oriented X 3. Moves all extremities spontaneously. Psych:  Responds to questions appropriately with a normal affect.   EKG:  The EKG was personally reviewed and demonstrates:  11/02/21 sinus tach 114bpm, no acute STT changes Telemetry:  Telemetry was personally reviewed and demonstrates:  N/A not on telemetry  Relevant CV Studies: 2D Echo 11/03/21  1. Left ventricular ejection fraction, by estimation, is 55 to 60%. The  left ventricle has normal function. The left ventricle has no regional  wall motion abnormalities. There is mild concentric left ventricular  hypertrophy. Left ventricular diastolic  parameters are consistent with Grade I diastolic dysfunction (impaired  relaxation).   2. Right ventricular systolic function is normal. The right ventricular  size is normal.   3. The mitral valve is normal in structure. No evidence of mitral valve  regurgitation. No evidence of mitral stenosis.   4. The aortic  valve is normal in structure. Aortic valve regurgitation is  not visualized. No aortic stenosis is present.   5. There is borderline dilatation of the ascending aorta, measuring 36  mm.   6. The inferior vena cava is normal in size with greater than 50%  respiratory variability, suggesting right atrial pressure of 3 mmHg.   Comparison(s): No prior Echocardiogram.   Laboratory Data:  High Sensitivity Troponin:   Recent Labs  Lab 10/24/21 0052 10/24/21 0336  TROPONINIHS 12 11     Chemistry Recent Labs  Lab 11/16/21 0452 11/18/21 0000 11/20/21 0420  NA 131* 131* 129*  K 4.1 4.1 4.0  CL 98 97* 97*  CO2 26 28 28   GLUCOSE 118* 119* 114*  BUN 19 26* 16  CREATININE 0.79 0.97 0.74  CALCIUM 9.1 9.0 8.6*  MG 1.8 2.1 1.7  GFRNONAA >60 >60 >60  ANIONGAP 7 6 4*    Recent Labs  Lab 11/20/21 0420  PROT 7.3  ALBUMIN 2.3*  AST 24  ALT 40  ALKPHOS 69  BILITOT 0.5   Lipids No results for input(s): CHOL, TRIG, HDL, LABVLDL, LDLCALC, CHOLHDL in the last 168 hours.  Hematology Recent Labs  Lab 11/16/21 0452 11/18/21 0000 11/20/21 0420  WBC 5.5 4.1 3.8*  RBC 2.96* 3.01* 2.93*  HGB 9.0* 9.3* 9.0*  HCT 27.2* 28.2* 27.2*  MCV 91.9 93.7 92.8  MCH 30.4 30.9 30.7  MCHC 33.1 33.0 33.1  RDW 14.6 14.7 14.4  PLT 312 286 264   Thyroid No results for input(s): TSH, FREET4 in the last 168 hours.  BNPNo results for input(s): BNP, PROBNP in the last 168 hours.  DDimer No results for input(s): DDIMER in the last 168 hours.   Radiology/Studies:  No results found.   Assessment and Plan:   1. Sepsis with MRSA bacteremia with discitis/vertebral osteomyelitis and epidural abscess in cervical region - TEE requested by ID for evaluation of endocarditis - however, as above, this was deferred due to identification of retropharyngeal edema  felt due to his cervical abscess as well as patient's concerns about positioning for the procedure - will discuss with Dr. Harrell Gave re:  recommendations vs alternative imaging modalities  2. Essential HTN - losartan discontinued due to soft BP, BP improved  3. Hyperlipidemia - defer to medicine team  Risk Assessment/Risk Scores:     N/A  For questions or updates, please contact [place] Please consult [email or web address] for contact info under    Signed, Charlie Pitter, PA-C  11/20/2021 7:17 AM

## 2021-11-20 NOTE — Progress Notes (Signed)
Pharmacy Antibiotic Note  Charles Weiss. is a 70 y.o. 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 Repeat blood cultures on 11/17 and 11/18 both with MRSA.  Pharmacy consulted by ID to switch vancomycin to daptomycin and ceftaroline.   11/20/21 12:39 PM CK 24 (11/27) SCr stable WBC low afebrile  Plan:  Continue Daptomycin 8mg /kg (750mg ) IV q24h (using adjusted body weight) Weekly CK (next on 12/4) Continue Ceftaroline 600mg  IV q8h Continue to monitor renal function, cultures, clinical course, ID recommendations    Height: 6' (182.9 cm) Weight: 112.4 kg (247 lb 12.8 oz) IBW/kg (Calculated) : 77.6  Temp (24hrs), Avg:98.3 F (36.8 C), Min:98 F (36.7 C), Max:98.8 F (37.1 C)  Recent Labs  Lab 11/14/21 0429 11/15/21 0342 11/16/21 0452 11/18/21 0000 11/20/21 0420  WBC 8.1 5.0 5.5 4.1 3.8*  CREATININE 0.96 0.68 0.79 0.97 0.74     Estimated Creatinine Clearance: 111.2 mL/min (by C-G formula based on SCr of 0.74 mg/dL).    Allergies  Allergen Reactions   Elavil [Amitriptyline] Other (See Comments)   Tetracyclines & Related Other (See Comments)   Antimicrobials this admission: 11/13 Vanc >> 11/20 11/13 Cefepime >> 11/14 11/13 Flagyl x1 in ED 11/14 Acyclovir >> 11/14 11/14 Ampicillin >> 11/14 11/20 Ceftaroline >>  11/20 Daptomycin >>  Microbiology results: 11/13 BCx: 4/4 MRSA 11/13 UCx:  > 100K MRSA  11/13 Resp PCR: negative for COVID/Influenza 11/15 BCx: NGF 11/17 BCx: MRSA 11/18 BCx: MRSA 11/20 BCx: NGF  Thank you for allowing pharmacy to be a part of this patient's care.   [name]  11/20/2021, 12:39 PM

## 2021-11-20 NOTE — Progress Notes (Signed)
PHARMACY CONSULT NOTE FOR:  OUTPATIENT  PARENTERAL ANTIBIOTIC THERAPY (OPAT)  Indication: MRSA bacteremia, epidural abscess, spinal osteo Regimen: Ceftaroline 600mg  IV q8h through 11/22/2021 and Daptomycin 735.2mg  IV q24h through 01/03/2022 End date:   IV antibiotic discharge orders are pended. To discharging provider:  please sign these orders via discharge navigator,  Select New Orders & click on the button choice - Manage This Unsigned Work.     Thank you for allowing pharmacy to be a part of this patient's care.  Peggyann Juba, PharmD, BCPS Pharmacy: [phone] 11/20/2021, 6:16 PM

## 2021-11-20 NOTE — TOC Progression Note (Signed)
Transition of Care (TOC) - Progression Note    Patient Details  Name: Charles Weiss. MRN: [id number] Date of Birth: Apr 28, 1951  Transition of Care [facility]) CM/SW Contact  Cadee Agro, Juliann Pulse, RN Phone Number: 11/20/2021, 2:12 PM  Clinical Narrative: [facility]-cannot provide Daptomycin iv-please consider a more reasonable abx.      Expected Discharge Plan: [place] Barriers to Discharge: Continued Medical Work up  Expected Discharge Plan and Services Expected Discharge Plan: [place]   Discharge Planning Services: CM Consult   Living arrangements for the past 2 months: Single Family Home                                       Social Determinants of Health (SDOH) Interventions    Readmission Risk Interventions Readmission Risk Prevention Plan 11/03/2021  Transportation Screening Complete  PCP or Specialist Appt within 3-5 Days Complete  HRI or [place] Complete  Social Work Consult for [place] Planning/Counseling Complete  Palliative Care Screening Not Applicable  Medication Review Press photographer) Complete

## 2021-11-20 NOTE — TOC Progression Note (Signed)
Transition of Care (TOC) - Progression Note    Patient Details  Name: Charles Weiss. MRN: [id number] Date of Birth: 1951-08-20  Transition of Care [facility]) CM/SW Contact  Karlisha Mathena, Juliann Pulse, RN Phone Number: 11/20/2021, 11:40 AM  Clinical Narrative: Not medically stable for d/c today-noted will need long term iv abx-has picc-[address] rep [facility] aware & following.      Expected Discharge Plan: [place] Barriers to Discharge: Continued Medical Work up  Expected Discharge Plan and Services Expected Discharge Plan: [place]   Discharge Planning Services: CM Consult   Living arrangements for the past 2 months: Single Family Home                                       Social Determinants of Health (SDOH) Interventions    Readmission Risk Interventions Readmission Risk Prevention Plan 11/03/2021  Transportation Screening Complete  PCP or Specialist Appt within 3-5 Days Complete  HRI or [place] Complete  Social Work Consult for [place] Planning/Counseling Complete  Palliative Care Screening Not Applicable  Medication Review Press photographer) Complete

## 2021-11-20 NOTE — Progress Notes (Addendum)
INFECTIOUS DISEASE PROGRESS NOTE  ID: Charles Weiss. is a 70 y.o. male with  Principal Problem:   Sepsis ([place]) Active Problems:   Hypertension   Hyperlipidemia   Anemia   Hyponatremia   Asthma   Acute respiratory failure with hypoxia ([place])   Vertebral osteomyelitis ([facility])   MRSA bacteremia   Retropharyngeal abscess   Right lower quadrant abdominal pain   Cervical discitis   Epidural abscess   Discitis of lumbosacral region   Back pain   Abdominal pain   Acute metabolic encephalopathy   Obesity  Subjective: C/o back and neck pain.   Abtx:  Anti-infectives (From admission, onward)    Start     Dose/Rate Route Frequency Ordered Stop   11/09/21 1515  ceftaroline (TEFLARO) 600 mg in sodium chloride 0.9 % 100 mL IVPB        600 mg 100 mL/hr over 60 Minutes Intravenous Every 8 hours 11/09/21 1418 11/23/21 1000   11/09/21 1230  DAPTOmycin (CUBICIN) 750 mg in sodium chloride 0.9 % IVPB        8 mg/kg  91.9 kg (Adjusted) 130 mL/hr over 30 Minutes Intravenous Daily 11/09/21 1139     11/06/21 2200  vancomycin (VANCOREADY) IVPB 1500 mg/300 mL  Status:  Discontinued        1,500 mg 150 mL/hr over 120 Minutes Intravenous Every 12 hours 11/06/21 1223 11/09/21 1128   11/03/21 1400  ceFEPIme (MAXIPIME) 2 g in sodium chloride 0.9 % 100 mL IVPB  Status:  Discontinued        2 g 200 mL/hr over 30 Minutes Intravenous Every 8 hours 11/03/21 1048 11/03/21 1119   11/03/21 1200  ampicillin (OMNIPEN) 1 g in sodium chloride 0.9 % 100 mL IVPB  Status:  Discontinued        1 g 300 mL/hr over 20 Minutes Intravenous Every 6 hours 11/03/21 1041 11/03/21 1119   11/03/21 0600  metroNIDAZOLE (FLAGYL) IVPB 500 mg  Status:  Discontinued        500 mg 100 mL/hr over 60 Minutes Intravenous Every 12 hours 11/02/21 2031 11/02/21 2248   11/03/21 0200  ceFEPIme (MAXIPIME) 2 g in sodium chloride 0.9 % 100 mL IVPB  Status:  Discontinued        2 g 200 mL/hr over 30 Minutes Intravenous Every 8  hours 11/02/21 2101 11/03/21 0445   11/03/21 0100  acyclovir (ZOVIRAX) 775 mg in dextrose 5 % 100 mL IVPB  Status:  Discontinued        10 mg/kg  77.6 kg (Ideal) 115.5 mL/hr over 60 Minutes Intravenous Every 8 hours 11/02/21 2308 11/03/21 0445   11/03/21 0000  ampicillin (OMNIPEN) 2 g in sodium chloride 0.9 % 100 mL IVPB  Status:  Discontinued        2 g 300 mL/hr over 20 Minutes Intravenous Every 4 hours 11/02/21 2308 11/03/21 0445   11/02/21 2200  vancomycin (VANCOREADY) IVPB 1000 mg/200 mL  Status:  Discontinued        1,000 mg 200 mL/hr over 60 Minutes Intravenous Every 12 hours 11/02/21 2101 11/06/21 1223   11/02/21 1700  ceFEPIme (MAXIPIME) 2 g in sodium chloride 0.9 % 100 mL IVPB        2 g 200 mL/hr over 30 Minutes Intravenous  Once 11/02/21 1646 11/02/21 1805   11/02/21 1700  metroNIDAZOLE (FLAGYL) IVPB 500 mg        500 mg 100 mL/hr over 60 Minutes Intravenous  Once 11/02/21  1646 11/02/21 2015   11/02/21 1700  vancomycin (VANCOCIN) IVPB 1000 mg/200 mL premix        1,000 mg 200 mL/hr over 60 Minutes Intravenous  Once 11/02/21 1646 11/02/21 2130       Medications: Scheduled:  albuterol  2.5 mg Nebulization TID   bisacodyl  10 mg Oral Q1500   Chlorhexidine Gluconate Cloth  6 each Topical Daily   docusate sodium  100 mg Oral BID   enoxaparin (LOVENOX) injection  60 mg Subcutaneous Q24H   gabapentin  300 mg Oral TID   mometasone-formoterol  2 puff Inhalation BID   pantoprazole  40 mg Oral Daily   polyethylene glycol  17 g Oral BID   QUEtiapine  25 mg Oral QHS   sodium chloride flush  10-40 mL Intracatheter Q12H   sodium chloride flush  3 mL Intravenous Q12H    Objective: Vital signs in last 24 hours: Temp:  [98 F (36.7 C)-98.9 F (37.2 C)] 98.9 F (37.2 C) (12/01 1300) Pulse Rate:  [76-83] 83 (12/01 1300) Resp:  [18-19] 19 (12/01 1300) BP: (111-130)/(71-79) 130/71 (12/01 1300) SpO2:  [90 %-97 %] 96 % (12/01 1300)   General appearance: alert, cooperative, and  no distress Resp: clear to auscultation bilaterally Cardio: regular rate and rhythm GI: normal findings: bowel sounds normal and soft, non-tender Extremities: edema none  Lab Results Recent Labs    11/18/21 0000 11/20/21 0420  WBC 4.1 3.8*  HGB 9.3* 9.0*  HCT 28.2* 27.2*  NA 131* 129*  K 4.1 4.0  CL 97* 97*  CO2 28 28  BUN 26* 16  CREATININE 0.97 0.74   Liver Panel Recent Labs    11/20/21 0420  PROT 7.3  ALBUMIN 2.3*  AST 24  ALT 40  ALKPHOS 69  BILITOT 0.5   Sedimentation Rate No results for input(s): ESRSEDRATE in the last 72 hours. C-Reactive Protein No results for input(s): CRP in the last 72 hours.  Microbiology: No results found for this or any previous visit (from the past 240 hour(s)).  Studies/Results: No results found.   Assessment/Plan: MRSA bacteremia Discitis/Osteo L4-5, L5-S1  Epidural abscess C1-2 epidural abscess, osteomyelitis  Total days of antibiotics: 12  Deemed to be high risk for TEE. Can reconsider at end of therapy.  Not a neurosurgical candidate.  Repeat BCx 11-20 negative.  Will plan for 8 weeks of anbx (2 weeks total dapto/ceftaroline then dapto alone to complete 8 weeks).  He needs PT, potential placement. Mobility very limited.  RUE PIC is clean.  CK 24 (11-16-21) Available as needed.    Allergies  Allergen Reactions   Elavil [Amitriptyline] Other (See Comments)   Tetracyclines & Related Other (See Comments)    OPAT Orders Discharge antibiotics to be given via PICC line Discharge antibiotics: dapto/ceftaroline Per pharmacy protocol dapto/ceftaroline  Duration: 12 weeks End Date: 01-03-22  [facility] Care Per Protocol: please  Home health RN for IV administration and teaching; PICC line care and labs.    Labs weekly while on IV antibiotics: _x_ CBC with differential __ BMP _x_ CMP __ CRP __ ESR __ Vancomycin trough _x_ CK  _x_ Please pull PIC at completion of IV antibiotics __ Please leave PIC in place  until doctor has seen patient or been notified  Fax weekly labs to [phone]  Clinic Follow Up Appt: 6 weeks.           [name] MD, FACP Infectious Diseases (pager) [phone] [email or web address] 11/20/2021, 4:30 PM  LOS: 18 days

## 2021-11-21 DIAGNOSIS — A4102 Sepsis due to Methicillin resistant Staphylococcus aureus: Secondary | ICD-10-CM | POA: Diagnosis not present

## 2021-11-21 LAB — CBC
HCT: 26.7 % — ABNORMAL LOW (ref 39.0–52.0)
Hemoglobin: 8.9 g/dL — ABNORMAL LOW (ref 13.0–17.0)
MCH: 30.6 pg (ref 26.0–34.0)
MCHC: 33.3 g/dL (ref 30.0–36.0)
MCV: 91.8 fL (ref 80.0–100.0)
Platelets: 274 10*3/uL (ref 150–400)
RBC: 2.91 MIL/uL — ABNORMAL LOW (ref 4.22–5.81)
RDW: 14.2 % (ref 11.5–15.5)
WBC: 2.9 10*3/uL — ABNORMAL LOW (ref 4.0–10.5)
nRBC: 0 % (ref 0.0–0.2)

## 2021-11-21 LAB — BASIC METABOLIC PANEL
Anion gap: 6 (ref 5–15)
BUN: 17 mg/dL (ref 8–23)
CO2: 28 mmol/L (ref 22–32)
Calcium: 8.6 mg/dL — ABNORMAL LOW (ref 8.9–10.3)
Chloride: 97 mmol/L — ABNORMAL LOW (ref 98–111)
Creatinine, Ser: 0.67 mg/dL (ref 0.61–1.24)
GFR, Estimated: >60 mL/min (ref >60)
Glucose, Bld: 123 mg/dL — ABNORMAL HIGH (ref 70–99)
Potassium: 4 mmol/L (ref 3.5–5.1)
Sodium: 131 mmol/L — ABNORMAL LOW (ref 135–145)

## 2021-11-21 LAB — MAGNESIUM: Magnesium: 1.7 mg/dL (ref 1.7–2.4)

## 2021-11-21 LAB — C-REACTIVE PROTEIN: CRP: 5.3 mg/dL — ABNORMAL HIGH (ref <1.0)

## 2021-11-21 LAB — SEDIMENTATION RATE: Sed Rate: 140 mm/hr — ABNORMAL HIGH (ref 0–16)

## 2021-11-21 MED ORDER — VANCOMYCIN HCL 1000 MG/200ML IV SOLN
1000.0000 mg | Freq: Two times a day (BID) | INTRAVENOUS | Status: DC
  Administered 2021-11-24 (×2): 1000 mg via INTRAVENOUS
  Filled 2021-11-21 (×2): qty 200

## 2021-11-21 MED ORDER — VANCOMYCIN HCL 2000 MG/400ML IV SOLN
2000.0000 mg | Freq: Once | INTRAVENOUS | Status: AC
  Administered 2021-11-23: 20:00:00 2000 mg via INTRAVENOUS
  Filled 2021-11-21: qty 400

## 2021-11-21 NOTE — Progress Notes (Signed)
Brief cardiology follow up note:  Per note yesterday, plan is for 8 weeks of antibiotics. Can consider TEE at the end of therapy per ID.  Cardiology will sign off, please call with any questions.  Buford Dresser, MD, PhD, [facility] at [facility] at [facility] [address] [place], [place] [address] [phone]

## 2021-11-21 NOTE — Progress Notes (Signed)
Pharmacy Antibiotic Note  Charles Weiss. is a 70 y.o. male admitted on 11/02/2021 with MRSA bacteremia with epidural abscess and cervical-lumbar osteomyelitis.  Has been on daptomycin and ceftaroline with plans through 12/3 for 2 weeks of therapy since culture clearance on 11/20.  Pharmacy has been consulted for vancomycin dosing to start 12/4 to help with discharge to SNF.  Scr steady at < 1.   Plan: Vancomycin 2000 mg X 1 then 1000 mg every 12 hours Predicted AUC 531 with Scr 1 and Vd 0.5 Monitor renal function and levels at steady state  Will update OPAT   Height: 6' (182.9 cm) Weight: 112.4 kg (247 lb 12.8 oz) IBW/kg (Calculated) : 77.6  Temp (24hrs), Avg:98.4 F (36.9 C), Min:98 F (36.7 C), Max:98.9 F (37.2 C)  Recent Labs  Lab 11/15/21 0342 11/16/21 0452 11/18/21 0000 11/20/21 0420 11/21/21 0419  WBC 5.0 5.5 4.1 3.8* 2.9*  CREATININE 0.68 0.79 0.97 0.74 0.67    Estimated Creatinine Clearance: 111.2 mL/min (by C-G formula based on SCr of 0.67 mg/dL).    Allergies  Allergen Reactions   Elavil [Amitriptyline] Other (See Comments)   Tetracyclines & Related Other (See Comments)     Thank you for allowing pharmacy to be a part of this patient's care.  Jimmy Footman, PharmD, BCPS, BCIDP Infectious Diseases Clinical Pharmacist Phone: [phone] 11/21/2021 8:27 AM

## 2021-11-21 NOTE — TOC Progression Note (Signed)
Transition of Care (TOC) - Progression Note    Patient Details  Name: Charles Weiss. MRN: [id number] Date of Birth: [birth year]  Transition of Care [facility]) CM/SW Contact  Zygmund Passero, Juliann Pulse, RN Phone Number: 11/21/2021, 2:09 PM  Clinical Narrative: [facility] rep [facility]-will check on availability of iv vancomycin, & if can accept over weekend if stable.      Expected Discharge Plan: [place] Barriers to Discharge: Continued Medical Work up  Expected Discharge Plan and Services Expected Discharge Plan: [place]   Discharge Planning Services: CM Consult   Living arrangements for the past 2 months: Single Family Home                                       Social Determinants of Health (SDOH) Interventions    Readmission Risk Interventions Readmission Risk Prevention Plan 11/03/2021  Transportation Screening Complete  PCP or Specialist Appt within 3-5 Days Complete  HRI or [place] Complete  Social Work Consult for [place] Planning/Counseling Complete  Palliative Care Screening Not Applicable  Medication Review Press photographer) Complete

## 2021-11-21 NOTE — Progress Notes (Signed)
Physical Therapy Treatment Patient Details Name: Charles Weiss. MRN: [id number] DOB: 20-Jun-1951 Today's Date: 11/21/2021   History of Present Illness 70 yo male presents to [facility] on 11/13 with neck pain, headaches x3 days, recent diagnosis of flu with admission 11/4-8. CTA head/neck shows retropharyngeal edema without visible pharyngeal or spinal source, MRI shows spine diffuse lumbar epidural enhancement without abscess; L4-5 OM changes; C5-6 prevertebral enhancement no osseous finding. workup for sepsis and mrsa along with spinal osseous involvement/discitis. PMH includes HTN, HLD, asthma, GERD.    PT Comments    Pt is slowly progressing toward acute PT goals with decreased assist for bed mobility and sit to stand transfer attempts demonstrating improved activity tolerance compared to previous session. Attempted sit to stand x2 from EOB, but pt unable to complete full stand due to L LE pain and weakness, lack of +2 assist this session. Increased time required with bed mobility and between transfer attempts. Pt deferring further mobility requesting to return to supine due to increased pain levels. Continue to recommend SNF at this time due to increased assist required for all mobility. Pt will benefit from continued skilled PT to increase their independence and maximize safety with mobility.     Recommendations for follow up therapy are one component of a multi-disciplinary discharge planning process, led by the attending physician.  Recommendations may be updated based on patient status, additional functional criteria and insurance authorization.  Follow Up Recommendations  Skilled nursing-short term rehab (<3 hours/day)     Assistance Recommended at Discharge Frequent or constant Supervision/Assistance  Equipment Recommendations  None recommended by PT    Recommendations for Other Services       Precautions / Restrictions Precautions Precautions: Fall Restrictions Weight  Bearing Restrictions: No     Mobility  Bed Mobility Overal bed mobility: Needs Assistance Bed Mobility: Sidelying to Sit;Sit to Sidelying Rolling: Min guard Sidelying to sit: Min guard;HOB elevated     Sit to sidelying: Min guard;HOB elevated General bed mobility comments: pt with improved tolerance and able to progress LEs toward EOB and roll to L side with review of log roll techinque for bed mobility. Pt with heavy use of bedrails and cues for sequencing able to perform with MIN guard and increased time. Pt with reported dizziness while sitting EOB, circulation exercises performed in sitting and increased time required for symptom resolution prior to transfer attempts. Vitals EOB: 106/56mmhg, 97bpm.    Transfers Overall transfer level: Needs assistance Equipment used: Rolling walker (2 wheels) Transfers: Sit to/from Stand Sit to Stand: Mod assist           General transfer comment: Attempted sit to stand x2 only able to perform 1/2 stand and pt returning to sitting EOB reporting too much weight on L LE leading to increased pain and L LE weakness. Pt deferring further mobility and requesting to return to supine.    Ambulation/Gait                   Stairs             Wheelchair Mobility    Modified Rankin (Stroke Patients Only)       Balance                                            Cognition Arousal/Alertness: Awake/alert Behavior During Therapy: Flat affect Overall Cognitive Status:  Within Functional Limits for tasks assessed                                 General Comments: Requires encouragement to participate.        Exercises      General Comments        Pertinent Vitals/Pain Pain Assessment: 0-10 Pain Score: 8  Pain Location: back, L hip Pain Descriptors / Indicators: Discomfort;Grimacing;Moaning;Stabbing;Burning Pain Intervention(s): Limited activity within patient's tolerance;Monitored during  session;Premedicated before session;Repositioned;Heat applied    Home Living                          Prior Function            PT Goals (current goals can now be found in the care plan section) Acute Rehab PT Goals Patient Stated Goal: return home, move to bedroom downstairs PT Goal Formulation: With patient Time For Goal Achievement: 11/19/21 Potential to Achieve Goals: Good Progress towards PT goals: Progressing toward goals    Frequency    Min 2X/week      PT Plan Current plan remains appropriate    Co-evaluation              AM-PAC PT "6 Clicks" Mobility   Outcome Measure  Help needed turning from your back to your side while in a flat bed without using bedrails?: A Lot Help needed moving from lying on your back to sitting on the side of a flat bed without using bedrails?: A Lot Help needed moving to and from a bed to a chair (including a wheelchair)?: Total Help needed standing up from a chair using your arms (e.g., wheelchair or bedside chair)?: Total Help needed to walk in hospital room?: Total Help needed climbing 3-5 steps with a railing? : Total 6 Click Score: 8    End of Session Equipment Utilized During Treatment: Gait belt Activity Tolerance: Patient limited by pain Patient left: in bed;with call bell/phone within reach;with bed alarm set;with nursing/sitter in room Nurse Communication: Mobility status PT Visit Diagnosis: Other abnormalities of gait and mobility (R26.89);Pain;Muscle weakness (generalized) (M62.81) Pain - Right/Left: Left Pain - part of body: Hip (and back)     Time: 7793-9030 PT Time Calculation (min) (ACUTE ONLY): 38 min  Charges:  $Therapeutic Activity: 38-52 mins                     Festus Barren PT, DPT  Acute Rehabilitation Services  Office [phone]   11/21/2021, 4:24 PM

## 2021-11-21 NOTE — Progress Notes (Signed)
PHARMACY CONSULT NOTE FOR:  OUTPATIENT  PARENTERAL ANTIBIOTIC THERAPY (OPAT)  Indication: MRSA bacteremia, epidural abscess, spinal osteo Regimen: Vancomycin 1000 mg every 12 hours  End date: 01/03/22  IV antibiotic discharge orders are pended. To discharging provider:  please sign these orders via discharge navigator,  Select New Orders & click on the button choice - Manage This Unsigned Work.     Thank you for allowing pharmacy to be a part of this patient's care.  Jimmy Footman, PharmD, BCPS, BCIDP Infectious Diseases Clinical Pharmacist Phone: [phone] 11/21/2021, 8:46 AM

## 2021-11-21 NOTE — Progress Notes (Signed)
PROGRESS NOTE    Charles Weiss.  VFI:[id number] DOB: 01-05-1951 DOA: 11/02/2021 PCP: System, Provider Not In    Chief Complaint  Patient presents with   Neck Pain   Headache    Brief Narrative: 70 yo with hx asthma, HTN, obesity who presented with neck pain and headache, found to have MRSA bacteremia.  He was initially started of vancomycin.  Imaging showed findings concerning for epidural abscess with cervical-lumbar osteo.  ID following.  Assessment & Plan:   Principal Problem:   Sepsis ([place]) Active Problems:   Hyperlipidemia   Acute respiratory failure with hypoxia (HCC)   Retropharyngeal abscess   Right lower quadrant abdominal pain   Vertebral osteomyelitis (HCC)   MRSA bacteremia   Cervical discitis   Epidural abscess   Discitis of lumbosacral region   Back pain   Abdominal pain   Acute metabolic encephalopathy   Hypertension   Asthma   Anemia   Hyponatremia   Obesity  * Sepsis ([place]) Secondary to MRSA bacteremia with discitis/vertebral osteomyelitis and epidural abscess in cervical region  MRI L spine with progressive changes of discitis/osteomyelitis at L4-5 with ventral epidural thickening and increased mass effect on thecal sac, possible changes of osteomyelitis involving R L5-S1 facet joint with posterior projecting small abscess or synovial cyst, stable endplate degenerative changes at L3-4 without definte signs of discitis or osteomyelitis  MRI C spine with progressive anterior epidural soft tissue thickening or complex fluid at C1-2 associated with new marrow edema in the C1 and C2 vertebral bodies, concerning for ostemyelitis and epidural abscess formation  Neurosurgery note from 11/23 recommending continued medical management   ENT note from 11/22 recommended CT neck to eval for retropharyngeal edema, will need eval/referral to oral surgery/DMD   Initial blood cx 11/13 with MRSA bacteremia, recurrent blood cx positive - blood cx 11/20 NGTD  x5  On daptomycin and teflaro - ID recommending 14 total days ceftaroline, then continue with daptomycin (now vanc as below)  Echo 11/14 with EF 41-66%, grade 1 diastolic dysfunction  Per cards, high risk for TEE -> consider treating medically with IV abx; medical treatment outpatient TEE once near completion of therapy; treat medically, gated CT to eval for abscesses or large vegetation --- cardiology recommending medical management at this time   Planning to reconsider TEE at end of therapy per ID  Transition to vanc at SNF for ease for SNF pharmacy   Abdominal pain Seems improved, aggressive bowel regimen   Back pain 2/2 above Mobility significantly limited  Acute metabolic encephalopathy Appears resolved, on 25 mg seroquel nightly  Hypertension Hold losartan for now  Asthma noted  Hyponatremia Mild, continue to monitor  Anemia trend  Obesity Body mass index is 33.61 kg/m.     DVT prophylaxis: lovenox Code Status: full  Family Communication: none at bedside Disposition:   Status is: Inpatient  Remains inpatient appropriate because: continued management of MRSA bacteremia       Consultants:  Cardiology ID ENT neurosurgery  Procedures:  Echo IMPRESSIONS     1. Left ventricular ejection fraction, by estimation, is 55 to 60%. The  left ventricle has normal function. The left ventricle has no regional  wall motion abnormalities. There is mild concentric left ventricular  hypertrophy. Left ventricular diastolic  parameters are consistent with Grade I diastolic dysfunction (impaired  relaxation).   2. Right ventricular systolic function is normal. The right ventricular  size is normal.   3. The mitral valve is normal in  structure. No evidence of mitral valve  regurgitation. No evidence of mitral stenosis.   4. The aortic valve is normal in structure. Aortic valve regurgitation is  not visualized. No aortic stenosis is present.   5. There is  borderline dilatation of the ascending aorta, measuring 36  mm.   6. The inferior vena cava is normal in size with greater than 50%  respiratory variability, suggesting right atrial pressure of 3 mmHg.   Comparison(s): No prior Echocardiogram.   Antimicrobials:  Anti-infectives (From admission, onward)    Start     Dose/Rate Route Frequency Ordered Stop   11/24/21 0800  vancomycin (VANCOREADY) IVPB 1000 mg/200 mL        1,000 mg 200 mL/hr over 60 Minutes Intravenous Every 12 hours 11/21/21 0844     11/23/21 2000  vancomycin (VANCOREADY) IVPB 2000 mg/400 mL        2,000 mg 200 mL/hr over 120 Minutes Intravenous  Once 11/21/21 0844     11/09/21 1515  ceftaroline (TEFLARO) 600 mg in sodium chloride 0.9 % 100 mL IVPB        600 mg 100 mL/hr over 60 Minutes Intravenous Every 8 hours 11/09/21 1418 11/22/21 2359   11/09/21 1230  DAPTOmycin (CUBICIN) 750 mg in sodium chloride 0.9 % IVPB        8 mg/kg  91.9 kg (Adjusted) 130 mL/hr over 30 Minutes Intravenous Daily 11/09/21 1139 11/22/21 2359   11/06/21 2200  vancomycin (VANCOREADY) IVPB 1500 mg/300 mL  Status:  Discontinued        1,500 mg 150 mL/hr over 120 Minutes Intravenous Every 12 hours 11/06/21 1223 11/09/21 1128   11/03/21 1400  ceFEPIme (MAXIPIME) 2 g in sodium chloride 0.9 % 100 mL IVPB  Status:  Discontinued        2 g 200 mL/hr over 30 Minutes Intravenous Every 8 hours 11/03/21 1048 11/03/21 1119   11/03/21 1200  ampicillin (OMNIPEN) 1 g in sodium chloride 0.9 % 100 mL IVPB  Status:  Discontinued        1 g 300 mL/hr over 20 Minutes Intravenous Every 6 hours 11/03/21 1041 11/03/21 1119   11/03/21 0600  metroNIDAZOLE (FLAGYL) IVPB 500 mg  Status:  Discontinued        500 mg 100 mL/hr over 60 Minutes Intravenous Every 12 hours 11/02/21 2031 11/02/21 2248   11/03/21 0200  ceFEPIme (MAXIPIME) 2 g in sodium chloride 0.9 % 100 mL IVPB  Status:  Discontinued        2 g 200 mL/hr over 30 Minutes Intravenous Every 8 hours 11/02/21  2101 11/03/21 0445   11/03/21 0100  acyclovir (ZOVIRAX) 775 mg in dextrose 5 % 100 mL IVPB  Status:  Discontinued        10 mg/kg  77.6 kg (Ideal) 115.5 mL/hr over 60 Minutes Intravenous Every 8 hours 11/02/21 2308 11/03/21 0445   11/03/21 0000  ampicillin (OMNIPEN) 2 g in sodium chloride 0.9 % 100 mL IVPB  Status:  Discontinued        2 g 300 mL/hr over 20 Minutes Intravenous Every 4 hours 11/02/21 2308 11/03/21 0445   11/02/21 2200  vancomycin (VANCOREADY) IVPB 1000 mg/200 mL  Status:  Discontinued        1,000 mg 200 mL/hr over 60 Minutes Intravenous Every 12 hours 11/02/21 2101 11/06/21 1223   11/02/21 1700  ceFEPIme (MAXIPIME) 2 g in sodium chloride 0.9 % 100 mL IVPB        2  g 200 mL/hr over 30 Minutes Intravenous  Once 11/02/21 1646 11/02/21 1805   11/02/21 1700  metroNIDAZOLE (FLAGYL) IVPB 500 mg        500 mg 100 mL/hr over 60 Minutes Intravenous  Once 11/02/21 1646 11/02/21 2015   11/02/21 1700  vancomycin (VANCOCIN) IVPB 1000 mg/200 mL premix        1,000 mg 200 mL/hr over 60 Minutes Intravenous  Once 11/02/21 1646 11/02/21 2130          Subjective: Waxing, waning back pain  Objective: Vitals:   11/21/21 0853 11/21/21 1300 11/21/21 1335 11/21/21 1934  BP:   129/76   Pulse:   90   Resp:   18   Temp:  98.3 F (36.8 C) 98.3 F (36.8 C)   TempSrc:   Oral   SpO2: 96%  98% 97%  Weight:      Height:        Intake/Output Summary (Last 24 hours) at 11/21/2021 2053 Last data filed at 11/21/2021 1900 Gross per 24 hour  Intake 200 ml  Output 1100 ml  Net -900 ml   Filed Weights   11/02/21 0701 11/11/21 1212  Weight: 113.4 kg 112.4 kg    Examination:  General: No acute distress. Cardiovascular: RRR Lungs: unlabored Abdomen: Soft, nontender, nondistended  Neurological: Alert and oriented 3. Moves all extremities 4. Cranial nerves II through XII grossly intact. Skin: Warm and dry. No rashes or lesions. Extremities: No clubbing or cyanosis. No edema Data  Reviewed: I have personally reviewed following labs and imaging studies  CBC: Recent Labs  Lab 11/15/21 0342 11/16/21 0452 11/18/21 0000 11/20/21 0420 11/21/21 0419  WBC 5.0 5.5 4.1 3.8* 2.9*  NEUTROABS  --   --   --  2.5  --   HGB 8.9* 9.0* 9.3* 9.0* 8.9*  HCT 26.2* 27.2* 28.2* 27.2* 26.7*  MCV 92.9 91.9 93.7 92.8 91.8  PLT 294 312 286 264 812    Basic Metabolic Panel: Recent Labs  Lab 11/15/21 0342 11/16/21 0452 11/18/21 0000 11/20/21 0420 11/21/21 0419  NA 130* 131* 131* 129* 131*  K 4.0 4.1 4.1 4.0 4.0  CL 95* 98 97* 97* 97*  CO2 27 26 28 28 28   GLUCOSE 114* 118* 119* 114* 123*  BUN 15 19 26* 16 17  CREATININE 0.68 0.79 0.97 0.74 0.67  CALCIUM 9.0 9.1 9.0 8.6* 8.6*  MG 1.8 1.8 2.1 1.7 1.7  PHOS  --   --   --  3.9  --     GFR: Estimated Creatinine Clearance: 111.2 mL/min (by C-G formula based on SCr of 0.67 mg/dL).  Liver Function Tests: Recent Labs  Lab 11/20/21 0420  AST 24  ALT 40  ALKPHOS 69  BILITOT 0.5  PROT 7.3  ALBUMIN 2.3*    CBG: No results for input(s): GLUCAP in the last 168 hours.   No results found for this or any previous visit (from the past 240 hour(s)).       Radiology Studies: No results found.      Scheduled Meds:  Chlorhexidine Gluconate Cloth  6 each Topical Daily   docusate sodium  100 mg Oral BID   enoxaparin (LOVENOX) injection  60 mg Subcutaneous Q24H   gabapentin  300 mg Oral TID   mometasone-formoterol  2 puff Inhalation BID   pantoprazole  40 mg Oral Daily   polyethylene glycol  17 g Oral BID   QUEtiapine  25 mg Oral QHS   sodium chloride flush  10-40 mL Intracatheter Q12H   sodium chloride flush  3 mL Intravenous Q12H   Continuous Infusions:  sodium chloride 10 mL/hr at 11/03/21 1032   ceFTAROline (TEFLARO) IV 600 mg (11/21/21 1418)   DAPTOmycin (CUBICIN)  IV 750 mg (11/21/21 1958)   [START ON 11/24/2021] vancomycin     [START ON 11/23/2021] vancomycin       LOS: 19 days    Time spent: over 30  min    Fayrene Helper, MD [facility]   To contact the attending provider between 7A-7P or the covering provider during after hours 7P-7A, please log into the web site [email or web address] and access using universal [place] password for that web site. If you do not have the password, please call the hospital operator.  11/21/2021, 8:53 PM

## 2021-11-22 DIAGNOSIS — A4102 Sepsis due to Methicillin resistant Staphylococcus aureus: Secondary | ICD-10-CM | POA: Diagnosis not present

## 2021-11-22 MED ORDER — HYDROMORPHONE HCL 1 MG/ML IJ SOLN
1.0000 mg | INTRAMUSCULAR | Status: DC | PRN
  Administered 2021-11-22 – 2021-11-23 (×3): 1 mg via INTRAVENOUS
  Filled 2021-11-22 (×4): qty 1

## 2021-11-22 NOTE — Progress Notes (Addendum)
PROGRESS NOTE    Charles Weiss.  FUX:[id number] DOB: 08/08/1951 DOA: 11/02/2021 PCP: System, Provider Not In    Chief Complaint  Patient presents with   Neck Pain   Headache    Brief Narrative: 70 yo with hx asthma, HTN, obesity who presented with neck pain and headache, found to have MRSA bacteremia.  He was initially started of vancomycin.  Imaging showed findings concerning for epidural abscess with cervical-lumbar osteo.  ID following.  Pain limiting factor at this time, continues to be poorly controlled requiring IV pain meds.  Assessment & Plan:   Principal Problem:   Sepsis ([place]) Active Problems:   Hyperlipidemia   Acute respiratory failure with hypoxia (HCC)   Retropharyngeal abscess   Right lower quadrant abdominal pain   Vertebral osteomyelitis (HCC)   MRSA bacteremia   Cervical discitis   Epidural abscess   Discitis of lumbosacral region   Back pain   Abdominal pain   Acute metabolic encephalopathy   Hypertension   Asthma   Anemia   Hyponatremia   Obesity  * Sepsis ([place]) Secondary to MRSA bacteremia with discitis/vertebral osteomyelitis and epidural abscess in cervical region  MRI L spine with progressive changes of discitis/osteomyelitis at L4-5 with ventral epidural thickening and increased mass effect on thecal sac, possible changes of osteomyelitis involving R L5-S1 facet joint with posterior projecting small abscess or synovial cyst, stable endplate degenerative changes at L3-4 without definte signs of discitis or osteomyelitis  MRI C spine with progressive anterior epidural soft tissue thickening or complex fluid at C1-2 associated with new marrow edema in the C1 and C2 vertebral bodies, concerning for ostemyelitis and epidural abscess formation  Neurosurgery note from 11/23 recommending continued medical management   ENT note from 11/22 recommended CT neck to eval for retropharyngeal edema, will need eval/referral to oral surgery/DMD    Initial blood cx 11/13 with MRSA bacteremia, recurrent blood cx positive - blood cx 11/20 NGTD x5  On daptomycin and teflaro - ID recommending 14 total days ceftaroline, then continue with daptomycin (now vanc as below - see pharmacy note 12/2)  Echo 11/14 with EF 02-54%, grade 1 diastolic dysfunction  Per cards, high risk for TEE -> consider treating medically with IV abx; medical treatment outpatient TEE once near completion of therapy; treat medically, gated CT to eval for abscesses or large vegetation --- cardiology recommending medical management at this time   Planning to reconsider TEE at end of therapy per ID  Transitioned to vanc at SNF for ease for SNF pharmacy   Abdominal pain Seems improved, aggressive bowel regimen   Back pain 2/2 above Mobility significantly limited Continues to require IV pain meds intermittently  Follow for improvement, consider repeat imaging if worsening  Acute metabolic encephalopathy Appears resolved, on 25 mg seroquel nightly  Hypertension Hold losartan for now  Asthma noted  Hyponatremia Mild, continue to monitor - ? Pain related  Anemia trend  Obesity Body mass index is 33.61 kg/m.  DVT prophylaxis: lovenox Code Status: full  Family Communication: none at bedside Disposition:   Status is: Inpatient  Remains inpatient appropriate because: continued management of MRSA bacteremia       Consultants:  Cardiology ID ENT neurosurgery  Procedures:  Echo IMPRESSIONS     1. Left ventricular ejection fraction, by estimation, is 55 to 60%. The  left ventricle has normal function. The left ventricle has no regional  wall motion abnormalities. There is mild concentric left ventricular  hypertrophy. Left ventricular  diastolic  parameters are consistent with Grade I diastolic dysfunction (impaired  relaxation).   2. Right ventricular systolic function is normal. The right ventricular  size is normal.   3. The mitral  valve is normal in structure. No evidence of mitral valve  regurgitation. No evidence of mitral stenosis.   4. The aortic valve is normal in structure. Aortic valve regurgitation is  not visualized. No aortic stenosis is present.   5. There is borderline dilatation of the ascending aorta, measuring 36  mm.   6. The inferior vena cava is normal in size with greater than 50%  respiratory variability, suggesting right atrial pressure of 3 mmHg.   Comparison(s): No prior Echocardiogram.   Antimicrobials:  Anti-infectives (From admission, onward)    Start     Dose/Rate Route Frequency Ordered Stop   11/24/21 0800  vancomycin (VANCOREADY) IVPB 1000 mg/200 mL        1,000 mg 200 mL/hr over 60 Minutes Intravenous Every 12 hours 11/21/21 0844     11/23/21 2000  vancomycin (VANCOREADY) IVPB 2000 mg/400 mL        2,000 mg 200 mL/hr over 120 Minutes Intravenous  Once 11/21/21 0844     11/09/21 1515  ceftaroline (TEFLARO) 600 mg in sodium chloride 0.9 % 100 mL IVPB        600 mg 100 mL/hr over 60 Minutes Intravenous Every 8 hours 11/09/21 1418 11/22/21 2359   11/09/21 1230  DAPTOmycin (CUBICIN) 750 mg in sodium chloride 0.9 % IVPB        8 mg/kg  91.9 kg (Adjusted) 130 mL/hr over 30 Minutes Intravenous Daily 11/09/21 1139 11/22/21 2359   11/06/21 2200  vancomycin (VANCOREADY) IVPB 1500 mg/300 mL  Status:  Discontinued        1,500 mg 150 mL/hr over 120 Minutes Intravenous Every 12 hours 11/06/21 1223 11/09/21 1128   11/03/21 1400  ceFEPIme (MAXIPIME) 2 g in sodium chloride 0.9 % 100 mL IVPB  Status:  Discontinued        2 g 200 mL/hr over 30 Minutes Intravenous Every 8 hours 11/03/21 1048 11/03/21 1119   11/03/21 1200  ampicillin (OMNIPEN) 1 g in sodium chloride 0.9 % 100 mL IVPB  Status:  Discontinued        1 g 300 mL/hr over 20 Minutes Intravenous Every 6 hours 11/03/21 1041 11/03/21 1119   11/03/21 0600  metroNIDAZOLE (FLAGYL) IVPB 500 mg  Status:  Discontinued        500 mg 100 mL/hr  over 60 Minutes Intravenous Every 12 hours 11/02/21 2031 11/02/21 2248   11/03/21 0200  ceFEPIme (MAXIPIME) 2 g in sodium chloride 0.9 % 100 mL IVPB  Status:  Discontinued        2 g 200 mL/hr over 30 Minutes Intravenous Every 8 hours 11/02/21 2101 11/03/21 0445   11/03/21 0100  acyclovir (ZOVIRAX) 775 mg in dextrose 5 % 100 mL IVPB  Status:  Discontinued        10 mg/kg  77.6 kg (Ideal) 115.5 mL/hr over 60 Minutes Intravenous Every 8 hours 11/02/21 2308 11/03/21 0445   11/03/21 0000  ampicillin (OMNIPEN) 2 g in sodium chloride 0.9 % 100 mL IVPB  Status:  Discontinued        2 g 300 mL/hr over 20 Minutes Intravenous Every 4 hours 11/02/21 2308 11/03/21 0445   11/02/21 2200  vancomycin (VANCOREADY) IVPB 1000 mg/200 mL  Status:  Discontinued        1,000 mg  200 mL/hr over 60 Minutes Intravenous Every 12 hours 11/02/21 2101 11/06/21 1223   11/02/21 1700  ceFEPIme (MAXIPIME) 2 g in sodium chloride 0.9 % 100 mL IVPB        2 g 200 mL/hr over 30 Minutes Intravenous  Once 11/02/21 1646 11/02/21 1805   11/02/21 1700  metroNIDAZOLE (FLAGYL) IVPB 500 mg        500 mg 100 mL/hr over 60 Minutes Intravenous  Once 11/02/21 1646 11/02/21 2015   11/02/21 1700  vancomycin (VANCOCIN) IVPB 1000 mg/200 mL premix        1,000 mg 200 mL/hr over 60 Minutes Intravenous  Once 11/02/21 1646 11/02/21 2130          Subjective: Waxing and waning pain Pain with movement  Objective: Vitals:   11/22/21 0824 11/22/21 1024 11/22/21 1235 11/22/21 1603  BP:  111/75 115/73 121/74  Pulse:  69 78   Resp:  15 19   Temp:  98.4 F (36.9 C) 98.3 F (36.8 C)   TempSrc:  Oral Oral   SpO2: 99% 96% 96%   Weight:      Height:        Intake/Output Summary (Last 24 hours) at 11/22/2021 1728 Last data filed at 11/22/2021 0948 Gross per 24 hour  Intake 1040 ml  Output 600 ml  Net 440 ml   Filed Weights   11/02/21 0701 11/11/21 1212  Weight: 113.4 kg 112.4 kg    Examination:  General: No acute distress.   Occasional pain with movement. Cardiovascular: RRR Lungs: unlabored Abdomen: Soft, nontender, nondistended  Neurological: Alert and oriented 3. Moves all extremities 4. Cranial nerves II through XII grossly intact. Skin: Warm and dry. No rashes or lesions. Extremities: No clubbing or cyanosis. No edema   Data Reviewed: I have personally reviewed following labs and imaging studies  CBC: Recent Labs  Lab 11/16/21 0452 11/18/21 0000 11/20/21 0420 11/21/21 0419  WBC 5.5 4.1 3.8* 2.9*  NEUTROABS  --   --  2.5  --   HGB 9.0* 9.3* 9.0* 8.9*  HCT 27.2* 28.2* 27.2* 26.7*  MCV 91.9 93.7 92.8 91.8  PLT 312 286 264 497    Basic Metabolic Panel: Recent Labs  Lab 11/16/21 0452 11/18/21 0000 11/20/21 0420 11/21/21 0419  NA 131* 131* 129* 131*  K 4.1 4.1 4.0 4.0  CL 98 97* 97* 97*  CO2 26 28 28 28   GLUCOSE 118* 119* 114* 123*  BUN 19 26* 16 17  CREATININE 0.79 0.97 0.74 0.67  CALCIUM 9.1 9.0 8.6* 8.6*  MG 1.8 2.1 1.7 1.7  PHOS  --   --  3.9  --     GFR: Estimated Creatinine Clearance: 111.2 mL/min (by C-G formula based on SCr of 0.67 mg/dL).  Liver Function Tests: Recent Labs  Lab 11/20/21 0420  AST 24  ALT 40  ALKPHOS 69  BILITOT 0.5  PROT 7.3  ALBUMIN 2.3*    CBG: No results for input(s): GLUCAP in the last 168 hours.   No results found for this or any previous visit (from the past 240 hour(s)).       Radiology Studies: No results found.      Scheduled Meds:  Chlorhexidine Gluconate Cloth  6 each Topical Daily   docusate sodium  100 mg Oral BID   enoxaparin (LOVENOX) injection  60 mg Subcutaneous Q24H   gabapentin  300 mg Oral TID   mometasone-formoterol  2 puff Inhalation BID   pantoprazole  40 mg  Oral Daily   polyethylene glycol  17 g Oral BID   QUEtiapine  25 mg Oral QHS   sodium chloride flush  10-40 mL Intracatheter Q12H   sodium chloride flush  3 mL Intravenous Q12H   Continuous Infusions:  sodium chloride 10 mL/hr at 11/03/21 1032    ceFTAROline (TEFLARO) IV 600 mg (11/22/21 1607)   DAPTOmycin (CUBICIN)  IV 750 mg (11/21/21 1958)   [START ON 11/24/2021] vancomycin     [START ON 11/23/2021] vancomycin       LOS: 20 days    Time spent: over 30 min    Fayrene Helper, MD [facility]   To contact the attending provider between 7A-7P or the covering provider during after hours 7P-7A, please log into the web site [email or web address] and access using universal [place] password for that web site. If you do not have the password, please call the hospital operator.  11/22/2021, 5:28 PM

## 2021-11-23 DIAGNOSIS — J9601 Acute respiratory failure with hypoxia: Secondary | ICD-10-CM | POA: Diagnosis not present

## 2021-11-23 DIAGNOSIS — R1032 Left lower quadrant pain: Secondary | ICD-10-CM | POA: Diagnosis not present

## 2021-11-23 NOTE — Progress Notes (Signed)
PROGRESS NOTE  Charles Weiss. DUK:[id number] DOB: 1951-09-22 DOA: 11/02/2021 PCP: System, Provider Not In  HPI/Recap of past 39 hours: 70 year old with history of asthma hypertension obesity who presented with neck pain headache and found to have MRSA bacteremia Was initially started on vancomycin imaging showed apical abscess with cervical lumbar S2 ID was consulted  Patient seen and examined at bedside he still complaining of left flank pain that increases when he coughs.  Assessment/Plan: Principal Problem:   Sepsis ([place]) Active Problems:   Hypertension   Hyperlipidemia   Anemia   Hyponatremia   Asthma   Acute respiratory failure with hypoxia (HCC)   Vertebral osteomyelitis (HCC)   MRSA bacteremia   Retropharyngeal abscess   Right lower quadrant abdominal pain   Cervical discitis   Epidural abscess   Discitis of lumbosacral region   Back pain   Abdominal pain   Acute metabolic encephalopathy   Obesity   Sepsis secondary to MRSA bacteremia with discitis vertebral/osteomyelitis and epidural abscess in the cervical region. Neurosurgical note will consult from November 23 recommend continuing medical therapy and management And infectious disease was consulted recommended vancomycin  Back pain secondary to discitis Patient continued to require IV pain medication  Hypertension, asthma,  hyponatremia we will replace and monitor Anemia we will monitor CBC   Code Status: Full  Severity of Illness: The appropriate patient status for this patient is INPATIENT. Inpatient status is judged to be reasonable and necessary in order to provide the required intensity of service to ensure the patient's safety. The patient's presenting symptoms, physical exam findings, and initial radiographic and laboratory data in the context of their chronic comorbidities is felt to place them at high risk for further clinical deterioration. Furthermore, it is not anticipated that the  patient will be medically stable for discharge from the hospital within 2 midnights of admission.  Still require inpatient therapy * I certify that at the point of admission it is my clinical judgment that the patient will require inpatient hospital care spanning beyond 2 midnights from the point of admission due to high intensity of service, high risk for further deterioration and high frequency of surveillance required.*   Family Communication: None at bedside  Disposition Plan: Likely home Status is: Inpatient   Dispo: The patient is from: Home              Anticipated d/c is to:               Anticipated d/c date is:               Patient currently not medically stable for discharge  Consultants: Infectious disease Neurosurgery Cardiology 30  Procedures:    Antimicrobials: Vancomycin end date of January 03, 2022  DVT prophylaxis: Lovenox   Objective: Vitals:   11/22/21 1603 11/22/21 1950 11/22/21 2221 11/23/21 0346  BP: 121/74  130/80 111/71  Pulse:   83 83  Resp:   18 18  Temp:   99.4 F (37.4 C) 99.3 F (37.4 C)  TempSrc:   Oral Oral  SpO2:  98% 91% 93%  Weight:      Height:        Intake/Output Summary (Last 24 hours) at 11/23/2021 0910 Last data filed at 11/22/2021 1800 Gross per 24 hour  Intake 560 ml  Output --  Net 560 ml   Filed Weights   11/02/21 0701 11/11/21 1212  Weight: 113.4 kg 112.4 kg   Body mass index is  33.61 kg/m.  Exam:  General: 70 y.o. year-old male well developed well nourished in no acute distress.  Alert and oriented x3. Cardiovascular: Regular rate and rhythm with no rubs or gallops.  No thyromegaly or JVD noted.   Respiratory: Clear to auscultation with no wheezes or rales. Good inspiratory effort. Abdomen: Soft nontender nondistended with normal bowel sounds x4 quadrants. Musculoskeletal: No lower extremity edema. 2/4 pulses in all 4 extremities. Skin: No ulcerative lesions noted or rashes, Psychiatry: Mood is  appropriate for condition and setting Neurology:    Data Reviewed: CBC: Recent Labs  Lab 11/18/21 0000 11/20/21 0420 11/21/21 0419  WBC 4.1 3.8* 2.9*  NEUTROABS  --  2.5  --   HGB 9.3* 9.0* 8.9*  HCT 28.2* 27.2* 26.7*  MCV 93.7 92.8 91.8  PLT 286 264 287   Basic Metabolic Panel: Recent Labs  Lab 11/18/21 0000 11/20/21 0420 11/21/21 0419  NA 131* 129* 131*  K 4.1 4.0 4.0  CL 97* 97* 97*  CO2 28 28 28   GLUCOSE 119* 114* 123*  BUN 26* 16 17  CREATININE 0.97 0.74 0.67  CALCIUM 9.0 8.6* 8.6*  MG 2.1 1.7 1.7  PHOS  --  3.9  --    GFR: Estimated Creatinine Clearance: 111.2 mL/min (by C-G formula based on SCr of 0.67 mg/dL). Liver Function Tests: Recent Labs  Lab 11/20/21 0420  AST 24  ALT 40  ALKPHOS 69  BILITOT 0.5  PROT 7.3  ALBUMIN 2.3*   No results for input(s): LIPASE, AMYLASE in the last 168 hours. No results for input(s): AMMONIA in the last 168 hours. Coagulation Profile: No results for input(s): INR, PROTIME in the last 168 hours. Cardiac Enzymes: No results for input(s): CKTOTAL, CKMB, CKMBINDEX, TROPONINI in the last 168 hours. BNP (last 3 results) No results for input(s): PROBNP in the last 8760 hours. HbA1C: No results for input(s): HGBA1C in the last 72 hours. CBG: No results for input(s): GLUCAP in the last 168 hours. Lipid Profile: No results for input(s): CHOL, HDL, LDLCALC, TRIG, CHOLHDL, LDLDIRECT in the last 72 hours. Thyroid Function Tests: No results for input(s): TSH, T4TOTAL, FREET4, T3FREE, THYROIDAB in the last 72 hours. Anemia Panel: No results for input(s): VITAMINB12, FOLATE, FERRITIN, TIBC, IRON, RETICCTPCT in the last 72 hours. Urine analysis:    Component Value Date/Time   COLORURINE YELLOW 11/02/2021 1633   APPEARANCEUR HAZY (A) 11/02/2021 1633   LABSPEC 1.004 (L) 11/02/2021 1633   PHURINE 6.0 11/02/2021 1633   GLUCOSEU NEGATIVE 11/02/2021 1633   HGBUR SMALL (A) 11/02/2021 1633   BILIRUBINUR NEGATIVE 11/02/2021 1633    KETONESUR NEGATIVE 11/02/2021 1633   PROTEINUR 100 (A) 11/02/2021 1633   NITRITE POSITIVE (A) 11/02/2021 1633   LEUKOCYTESUR NEGATIVE 11/02/2021 1633   Sepsis Labs: @LABRCNTIP (procalcitonin:4,lacticidven:4)  )No results found for this or any previous visit (from the past 240 hour(s)).    Studies: No results found.  Scheduled Meds:  Chlorhexidine Gluconate Cloth  6 each Topical Daily   docusate sodium  100 mg Oral BID   enoxaparin (LOVENOX) injection  60 mg Subcutaneous Q24H   gabapentin  300 mg Oral TID   mometasone-formoterol  2 puff Inhalation BID   pantoprazole  40 mg Oral Daily   polyethylene glycol  17 g Oral BID   QUEtiapine  25 mg Oral QHS   sodium chloride flush  10-40 mL Intracatheter Q12H   sodium chloride flush  3 mL Intravenous Q12H    Continuous Infusions:  sodium chloride 10  mL/hr at 11/03/21 1032   [START ON 11/24/2021] vancomycin     vancomycin       LOS: 21 days     Cristal Deer, MD [facility]  To reach me or the doctor on call, go to: [email or web address] Password [email or web address]  11/23/2021, 9:10 AM

## 2021-11-24 DIAGNOSIS — R1032 Left lower quadrant pain: Secondary | ICD-10-CM

## 2021-11-24 DIAGNOSIS — I10 Essential (primary) hypertension: Secondary | ICD-10-CM | POA: Diagnosis not present

## 2021-11-24 DIAGNOSIS — R1031 Right lower quadrant pain: Secondary | ICD-10-CM | POA: Diagnosis not present

## 2021-11-24 DIAGNOSIS — A4102 Sepsis due to Methicillin resistant Staphylococcus aureus: Secondary | ICD-10-CM | POA: Diagnosis not present

## 2021-11-24 LAB — BASIC METABOLIC PANEL
Anion gap: 8 (ref 5–15)
BUN: 12 mg/dL (ref 8–23)
CO2: 26 mmol/L (ref 22–32)
Calcium: 8.5 mg/dL — ABNORMAL LOW (ref 8.9–10.3)
Chloride: 95 mmol/L — ABNORMAL LOW (ref 98–111)
Creatinine, Ser: 0.69 mg/dL (ref 0.61–1.24)
GFR, Estimated: >60 mL/min (ref >60)
Glucose, Bld: 126 mg/dL — ABNORMAL HIGH (ref 70–99)
Potassium: 3.7 mmol/L (ref 3.5–5.1)
Sodium: 129 mmol/L — ABNORMAL LOW (ref 135–145)

## 2021-11-24 LAB — CBC
HCT: 27.9 % — ABNORMAL LOW (ref 39.0–52.0)
Hemoglobin: 9.3 g/dL — ABNORMAL LOW (ref 13.0–17.0)
MCH: 30.6 pg (ref 26.0–34.0)
MCHC: 33.3 g/dL (ref 30.0–36.0)
MCV: 91.8 fL (ref 80.0–100.0)
Platelets: 229 10*3/uL (ref 150–400)
RBC: 3.04 MIL/uL — ABNORMAL LOW (ref 4.22–5.81)
RDW: 14.6 % (ref 11.5–15.5)
WBC: 2.9 10*3/uL — ABNORMAL LOW (ref 4.0–10.5)
nRBC: 0 % (ref 0.0–0.2)

## 2021-11-24 LAB — RESP PANEL BY RT-PCR (FLU A&B, COVID) ARPGX2
Influenza A by PCR: NEGATIVE
Influenza B by PCR: NEGATIVE
SARS Coronavirus 2 by RT PCR: NEGATIVE

## 2021-11-24 LAB — MAGNESIUM: Magnesium: 1.7 mg/dL (ref 1.7–2.4)

## 2021-11-24 MED ORDER — VANCOMYCIN IV (FOR PTA / DISCHARGE USE ONLY): 1000.0000 mg | Freq: Two times a day (BID) | INTRAVENOUS | 0 refills | Status: AC

## 2021-11-24 MED ORDER — QUETIAPINE FUMARATE 25 MG PO TABS: 25.0000 mg | ORAL_TABLET | Freq: Every day | ORAL | Status: AC

## 2021-11-24 MED ORDER — OXYCODONE HCL 15 MG PO TABS: 15.0000 mg | ORAL_TABLET | ORAL | 0 refills | Status: DC | PRN

## 2021-11-24 NOTE — TOC Transition Note (Addendum)
Transition of Care [facility]) - CM/SW Discharge Note   Patient Details  Name: Charles Weiss. MRN: [id number] Date of Birth: [birth year]  [facility] CM/SW Contact:  Dessa Phi, RN Phone Number: 11/24/2021, 1:45 PM   Clinical Narrative: d/c today [facility] aware of vancomycin iv abx. D/c summary sent to [address] Awaiting covid results,rm#,tel# for report. Then [facility] to be called.   2p-rm#[id number],report #[phone].await covid results. 3:18p-covid neg.PTAR called. No further CM needs.    Final next level of care: Skilled Nursing Facility Barriers to Discharge: No Barriers Identified   Patient Goals and CMS Choice Patient states their goals for this hospitalization and ongoing recovery are:: to go home CMS Medicare.gov Compare Post Acute Care list provided to:: Patient Choice offered to / list presented to : Patient  Discharge Placement PASRR number recieved: 11/17/21            Patient chooses bed at: [facility] Patient to be transferred to facility by: [place] Name of family member notified: Rebekah [place]field sister [phone] Patient and family notified of of transfer: 11/24/21  Discharge Plan and Services   Discharge Planning Services: CM Consult                                 Social Determinants of Health ([place]) Interventions     Readmission Risk Interventions Readmission Risk Prevention Plan 11/03/2021  Transportation Screening Complete  PCP or Specialist Appt within 3-5 Days Complete  HRI or [place] Complete  Social Work Consult for [place] Planning/Counseling Complete  Palliative Care Screening Not Applicable  Medication Review Press photographer) Complete

## 2021-11-24 NOTE — Progress Notes (Signed)
Physical Therapy Treatment Patient Details Name: Charles Weiss. MRN: [id number] DOB: [birth year] Today's Date: 11/24/2021   History of Present Illness 70 yo male [facility] on [facility] with neck pain, headaches x3 days, recent diagnosis of flu with admission 11/4-8. CTA head/neck shows retropharyngeal edema without visible pharyngeal or spinal source, MRI shows spine diffuse lumbar epidural enhancement without abscess; L4-5 OM changes; C5-6 prevertebral enhancement no osseous finding. workup for sepsis and mrsa along with spinal osseous involvement/discitis. PMH includes HTN, HLD, asthma, GERD.    PT Comments    Pt agreeable to participation and very grateful and motivated following session today. Pt is progressing toward acute PT goals with progression of forward ambulation this session. Pt required MIN A+2 for power up from elevated EOB, able to ambulate with use of RW 39ft forward x2, and take lateral steps along EOB. Review of log roll techniques for bed mobility, requiring repeated cuing and sequencing for proper technique. Pt will benefit from continued skilled PT to increase their independence and maximize safety with mobility.     Recommendations for follow up therapy are one component of a multi-disciplinary discharge planning process, led by the attending physician.  Recommendations may be updated based on patient status, additional functional criteria and insurance authorization.  Follow Up Recommendations  Skilled nursing-short term rehab (<3 hours/day)     Assistance Recommended at Discharge Frequent or constant Supervision/Assistance  Equipment Recommendations  None recommended by PT    Recommendations for Other Services       Precautions / Restrictions Precautions Precautions: Fall Restrictions Weight Bearing Restrictions: No     Mobility  Bed Mobility Overal bed mobility: Needs Assistance Bed Mobility: Sidelying to Sit;Sit to Sidelying Rolling: Min  guard Sidelying to sit: Min guard;HOB elevated     Sit to sidelying: HOB elevated;Min assist General bed mobility comments: Log roll techinque, rolling to R side today. Pt with heavy use of bed features and cues for sequencing; increased time required. Pt with reported dizziness while sitting EOB, reports he feels sensation with medications sometimes. Reported increase in dizziness with side to side head turns, less intense with lateral eye movements, no nystagmus observed. MIN A for progression of B LEs onto bed, cues for log roll techinque/sequencing    Transfers Overall transfer level: Needs assistance Equipment used: Rolling walker (2 wheels) Transfers: Sit to/from Stand Sit to Stand: Min assist;+2 safety/equipment;From elevated surface           General transfer comment: MIN A +2; cues for safe hand placement. Able to take lateral steps x4 along EOB.    Ambulation/Gait Ambulation/Gait assistance: Min assist Gait Distance (Feet): 4 Feet (15ftx2) Assistive device: Rolling walker (2 wheels) Gait Pattern/deviations: Step-through pattern;Decreased stride length Gait velocity: decr     General Gait Details: 24ftx2 with MIN A+2 and increased time and cues for sequencing steps. Repeated reminders for deep breathing with discomfort in back and intermittently in L hip. reported fatigue following   Stairs             Wheelchair Mobility    Modified Rankin (Stroke Patients Only)       Balance Overall balance assessment: Needs assistance;History of Falls Sitting-balance support: No upper extremity supported;Feet supported Sitting balance-Leahy Scale: Poor     Standing balance support: Bilateral upper extremity supported;During functional activity Standing balance-Leahy Scale: Poor Standing balance comment: reliant on external support  Cognition Arousal/Alertness: Awake/alert Behavior During Therapy: Flat affect Overall Cognitive  Status: Within Functional Limits for tasks assessed                                          Exercises      General Comments        Pertinent Vitals/Pain Pain Assessment: 0-10 Pain Score: 4  Pain Location: back, L hip Pain Descriptors / Indicators: Discomfort;Grimacing;Moaning;Stabbing;Tightness Pain Intervention(s): Limited activity within patient's tolerance;Monitored during session;Repositioned    Home Living                          Prior Function            PT Goals (current goals can now be found in the care plan section) Acute Rehab PT Goals Patient Stated Goal: return home, move to bedroom downstairs PT Goal Formulation: With patient Time For Goal Achievement: 11/19/21 Potential to Achieve Goals: Good Progress towards PT goals: Progressing toward goals    Frequency    Min 2X/week      PT Plan Current plan remains appropriate    Co-evaluation              AM-PAC PT "6 Clicks" Mobility   Outcome Measure  Help needed turning from your back to your side while in a flat bed without using bedrails?: A Lot Help needed moving from lying on your back to sitting on the side of a flat bed without using bedrails?: A Lot Help needed moving to and from a bed to a chair (including a wheelchair)?: Total Help needed standing up from a chair using your arms (e.g., wheelchair or bedside chair)?: Total Help needed to walk in hospital room?: Total Help needed climbing 3-5 steps with a railing? : Total 6 Click Score: 8    End of Session Equipment Utilized During Treatment: Gait belt Activity Tolerance: Patient limited by pain Patient left: in bed;with call bell/phone within reach;with bed alarm set;with nursing/sitter in room Nurse Communication: Mobility status PT Visit Diagnosis: Other abnormalities of gait and mobility (R26.89);Pain;Muscle weakness (generalized) (M62.81) Pain - Right/Left: Left Pain - part of body: Hip (and back)      Time: 1093-2355 PT Time Calculation (min) (ACUTE ONLY): 35 min  Charges:  $Therapeutic Activity: 23-37 mins                     Festus Barren PT, DPT  Acute Rehabilitation Services  Office [phone]   11/24/2021, 5:25 PM

## 2021-11-24 NOTE — Discharge Summary (Signed)
Physician Discharge Summary  [facility] [place]. BWG:[id number] DOB: Aug 11, 1951 DOA: 11/02/2021  PCP: System, Provider Not In  Admit date: 11/02/2021 Discharge date: 11/24/2021  Admitted From: Home Disposition:  SNF  Recommendations for Outpatient Follow-up:  Follow up with PCP in 1-2 weeks Please obtain BMP/CBC in one week your next doctors visit.  IV vancomycin every 12 hours 1 g, last day 01/03/2022.  Orders have been placed by ID infectious disease team Pain medication with bowel regimen Outpatient follow-up with infectious disease team to be arranged by their service   Discharge Condition: Stable CODE STATUS: Full code Diet recommendation: Heart healthy  Brief/Interim Summary: 70 year old with history of asthma, HTN comes to the hospital complains of neck pain and headache.  He was found to have MRSA bacteremia therefore infectious disease was consulted.  He was started on vancomycin.  Work-up revealed cervical epidural.  Repeat CT and MRI showed progression of osteomyelitis/discitis and epidural abscess in the lumbar and cervical region.  No cord compression was noted, neurosurgery was consulted.  He was recommended to continue medical management at this time.  PT recommended SNF therefore arrangements were made.  Plan to do TEE towards the end of IV antibiotic treatment. Overall patient is medically stable for discharge.     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  ID following.  Planning to discharge on vancomycin for the ease by SNF pharmacy - 2D echo is overall unremarkable - ID is following.  There is definite progression of epidural abscess in the cervical region and discitis/osteomyelitis in the lumbar/sacral region.  Neurosurgery recommends medical management at this time.  Holding off on MRI thoracic spine.  Cardiology decided TEE towards the end of the patient's treatment -Pain control-oxycodone IR.  Bowel regimen.   Retropharyngeal edema - Seen by ENT.  No evidence of abscess.  Outpatient follow-up with ENT as needed.   Abdominal Pain -Resolved   Acute Encephalopathy with Agitation, improved -This is fluctuating.  He is on bedtime 25 mg of Seroquel.   Hypertension - Continue home medications   Hyperlipidemia -Resume statin when able     Asthma -As needed bronchodilators   Hyperbilirubinemia -Resolved   Normocytic Anemia --Anemia of chronic disease.  Continue to closely monitor.   Obesity -Weight loss and outpatient dietary counseling    Body mass index is 33.61 kg/m.         Discharge Diagnoses:  Principal Problem:   Sepsis ([place]) Active Problems:   Hypertension   Hyperlipidemia   Anemia   Hyponatremia   Asthma   Acute respiratory failure with hypoxia (HCC)   Vertebral osteomyelitis (HCC)   MRSA bacteremia   Retropharyngeal abscess   Right lower quadrant abdominal pain   Cervical discitis   Epidural abscess   Discitis of lumbosacral region   Back pain   Abdominal pain   Acute metabolic encephalopathy   Obesity  Consultations: Infectious disease Neurosurgery Cardiology  Subjective: Still has pain in the lower back but improved.  No other complaints  Discharge Exam: Vitals:   11/24/21 0902 11/24/21 0904  BP:    Pulse:    Resp:    Temp:    SpO2: 97% 97%   Vitals:   11/23/21 2144 11/24/21 0558 11/24/21 0902 11/24/21  0904  BP: 128/79 129/70    Pulse: 76 75    Resp: 15 20    Temp: 99.2 F (37.3 C) 97.7 F (36.5 C)    TempSrc: Oral Oral    SpO2: 96% 97% 97% 97%  Weight:      Height:        General: Pt is alert, awake, not in acute distress Cardiovascular: RRR, S1/S2 +, no rubs, no gallops Respiratory: CTA bilaterally, no wheezing, no rhonchi Abdominal: Soft, NT, ND, bowel sounds + Extremities: no edema, no cyanosis  Discharge Instructions  Discharge Instructions     Home infusion instructions   Complete by: As directed    Instructions: Flushing of vascular access device: 0.9% NaCl pre/post medication administration and prn patency; Heparin 100 u/ml, 8m for implanted ports and Heparin 10u/ml, 533mfor all other central venous catheters.      Allergies as of 11/24/2021       Reactions   Elavil [amitriptyline] Other (See Comments)   Tetracyclines & Related Other (See Comments)        Medication List     STOP taking these medications    indomethacin 75 MG CR capsule Commonly known as: INDOCIN SR   predniSONE 10 MG tablet Commonly known as: DELTASONE       TAKE these medications    acetaminophen 650 MG CR tablet Commonly known as: TYLENOL Take 650 mg by mouth every 8 (eight) hours as needed for pain.   albuterol (2.5 MG/3ML) 0.083% nebulizer solution Commonly known as: PROVENTIL Take 3 mLs by nebulization 3 (three) times daily.   amLODipine 5 MG tablet Commonly known as: NORVASC Take 5 mg by mouth daily.   aspirin 81 MG chewable tablet Chew by mouth daily.   B-12 PO Take 1 tablet by mouth daily.   B-6 PO Take 1 tablet by mouth daily.   benzonatate 100 MG capsule Commonly known as: TESSALON Take 1 capsule (100 mg total) by mouth 3 (three) times daily.   chlorpheniramine-HYDROcodone 10-8 MG/5ML Suer Commonly known as: TUSSIONEX Take 5 mLs by mouth every 12 (twelve) hours as needed for cough.   dextromethorphan 30 MG/5ML liquid Commonly known as:  DELSYM Take 5 mLs (30 mg total) by mouth 2 (two) times daily as needed for cough.   dextromethorphan-guaiFENesin 30-600 MG 12hr tablet Commonly known as: MUCINEX DM Take 1 tablet by mouth 2 (two) times daily.   docusate sodium 100 MG capsule Commonly known as: Colace Take 1 capsule (100 mg total) by mouth 2 (two) times daily.   gabapentin 300 MG capsule Commonly known as: NEURONTIN Take 1 tablet (300 mg) in the morning and 2 tablets (600 mg) before bed. What changed:  how much to take how to take this when to take this additional instructions   hydrochlorothiazide 12.5 MG tablet Commonly known as: HYDRODIURIL Take 12.5 mg by mouth daily.   ketotifen 0.025 % ophthalmic solution Commonly known as: ZADITOR Place 1 drop into both eyes 2 (two) times daily as needed (allergies).   losartan 50 MG tablet Commonly known as: COZAAR Take 50 mg by mouth daily.   omeprazole  20 MG capsule Commonly known as: PRILOSEC Take 20 mg by mouth 2 (two) times daily before a meal.   oxyCODONE 15 MG immediate release tablet Commonly known as: ROXICODONE Take 1 tablet (15 mg total) by mouth every 4 (four) hours as needed for moderate pain or severe pain.   polyethylene glycol 17 g packet Commonly known as: MIRALAX / GLYCOLAX Take 17 g by mouth daily.   QUEtiapine 25 MG tablet Commonly known as: SEROQUEL Take 1 tablet (25 mg total) by mouth at bedtime.   simvastatin 40 MG tablet Commonly known as: ZOCOR Take 40 mg by mouth daily.   tizanidine 2 MG capsule Commonly known as: Zanaflex Take 1 capsule (2 mg total) by mouth at bedtime as needed for muscle spasms.   vancomycin  IVPB Inject 1,000 mg into the vein every 12 (twelve) hours. Indication:  MRSA bacteremia/epidural abscess/cervical-lumbar discitis  First Dose: Yes Last Day of Therapy:  01/03/22 Labs - Sunday/Monday:  CBC/D, BMP, and vancomycin trough. Labs - Thursday:  BMP and vancomycin trough Labs - Every other week:  ESR and  CRP Method of administration:Elastomeric Method of administration may be changed at the discretion of the patient and/or caregiver's ability to self-administer the medication ordered.   Vitamin D3 25 MCG (1000 UT) Caps Take 1,000 Units by mouth daily.   Wixela Inhub 250-50 MCG/ACT Aepb Generic drug: fluticasone-salmeterol Inhale 1 puff into the lungs 2 (two) times daily.               Home Infusion Instuctions  (From admission, onward)           Start     Ordered   11/24/21 0000  Home infusion instructions       Question:  Instructions  Answer:  Flushing of vascular access device: 0.9% NaCl pre/post medication administration and prn patency; Heparin 100 u/ml, 23m for implanted ports and Heparin 10u/ml, 552mfor all other central venous catheters.   11/24/21 1334            Contact information for after-discharge care     Destination     [facility]erred [facility] .   Service: Skilled Nursing Contact information: [phone]                  Allergies  Allergen Reactions   Elavil [Amitriptyline] Other (See Comments)   Tetracyclines & Related Other (See Comments)    You were cared for by a hospitalist during your hospital stay. If you have any questions about your discharge medications or the care you received while you were in the hospital after you are discharged, you can call the unit and asked to speak with the hospitalist on call if the hospitalist that took care of you is not available. Once you are discharged, your primary care physician will handle any further medical issues. Please note that no refills for any discharge medications will be authorized once you are discharged, as it is imperative that you return to your primary care physician (or establish a relationship with a primary care physician if you do not have one) for your aftercare needs so that they can reassess your need for  medications and monitor your lab values.   Procedures/Studies: CT Angio Head W or Wo Contrast  Result Date: 11/02/2021 CLINICAL DATA:  Headache with severe neck pain. EXAM: CT ANGIOGRAPHY HEAD AND NECK TECHNIQUE: Multidetector CT imaging of the head and neck was performed using  the standard protocol during bolus administration of intravenous contrast. Multiplanar CT image reconstructions and MIPs were obtained to evaluate the vascular anatomy. Carotid stenosis measurements (when applicable) are obtained utilizing NASCET criteria, using the distal internal carotid diameter as the denominator. CONTRAST:  35m OMNIPAQUE IOHEXOL 350 MG/ML SOLN COMPARISON:  None. FINDINGS: CT HEAD FINDINGS Brain: No evidence of acute infarction, hemorrhage, hydrocephalus, extra-axial collection or mass lesion/mass effect. Mild chronic small vessel disease in the hemispheric white matter. Chronic lacune at the right caudate head. Vascular: See below Skull: Negative Sinuses: Negative Orbits: Negative Review of the MIP images confirms the above findings CTA NECK FINDINGS Aortic arch: Normal Right carotid system: Vessels are smooth and widely patent Left carotid system: Vessels are smooth and widely patent. Minimal atheromatous changes. Vertebral arteries: No proximal subclavian stenosis. Both vertebral arteries are smoothly contoured and widely patent. Skeleton: Subtle but convincing retropharyngeal/prevertebral fat haziness. No associated muscular calcification or focal endplate erosion. The cervical spine is diffusely degenerated. No evidence of mucosal inflammation the level of the throat. Other neck: As above Upper chest: Negative Review of the MIP images confirms the above findings CTA HEAD FINDINGS Anterior circulation: No significant stenosis, proximal occlusion, aneurysm, or vascular malformation. Posterior circulation: Vessels are smooth and diffusely patent. Negative for aneurysm or vascular malformation. Venous sinuses:  Unremarkable Anatomic variants: No significant variant. Review of the MIP images confirms the above findings IMPRESSION: 1. Retropharyngeal edema without visible pharyngeal or spinal source, suggest enhanced cervical MRI and inflammatory labs. 2. Mild for age atherosclerosis. Electronically Signed   By: JJorje GuildM.D.   On: 11/02/2021 12:02   DG Chest 2 View  Result Date: 10/26/2021 CLINICAL DATA:  Productive cough and shortness of breath. EXAM: CHEST - 2 VIEW COMPARISON:  Radiograph 10/24/2021. FINDINGS: There is faint but new patchy bilateral airspace disease within both lungs, slight peripheral predominant distribution. Mild interstitial and bronchial thickening. Heart is normal in size. Mild aortic tortuosity. There may be trace pleural effusions. No pneumothorax. No acute osseous abnormalities are seen. IMPRESSION: Faint new patchy bilateral airspace disease, slight peripheral predominant distribution, suspicious for multifocal pneumonia, including atypical viral infection. Electronically Signed   By: MKeith RakeM.D.   On: 10/26/2021 16:56   CT SOFT TISSUE NECK W CONTRAST  Addendum Date: 11/12/2021   ADDENDUM REPORT: 11/12/2021 12:33 ADDENDUM: These results were called by telephone at the time of interpretation on 11/12/2021 at 12:28 pm to provider AReesa Chew, who verbally acknowledged these results. Electronically Signed   By: CFranchot GalloM.D.   On: 11/12/2021 12:33   Result Date: 11/12/2021 CLINICAL DATA:  Retropharyngeal edema.  History of prostate cancer EXAM: CT NECK WITH CONTRAST TECHNIQUE: Multidetector CT imaging of the neck was performed using the standard protocol following the bolus administration of intravenous contrast. CONTRAST:  675mOMNIPAQUE IOHEXOL 350 MG/ML SOLN COMPARISON:  CT angio head neck 11/02/2021. MRI cervicothoracic and lumbar spine 11/13/2021 FINDINGS: Pharynx and larynx: Diffuse soft tissue swelling of the prevertebral soft tissues extending from the skull  base to a proximally C3 or C4. No mass or prevertebral abscess identified. Airway intact. Epiglottis and larynx normal. Salivary glands: No inflammation, mass, or stone. Thyroid: Negative Lymph nodes: No enlarged or pathologic lymph nodes in the neck. Vascular: Normal vascular enhancement Limited intracranial: Negative Visualized orbits: Incompletely evaluated. Mastoids and visualized paranasal sinuses: Paranasal sinuses clear. Mastoid sinus clear. Skeleton: Cervical spondylosis. Negative for cervical fracture or osteomyelitis. No evidence of discitis. There is progressive ventral epidural soft tissue thickening  beginning at C1 and extending to C2-3. This is causing some cord flattening and mild spinal stenosis. Subtle low-density within the thickening may represent epidural abscess. This has progressed since the prior CT and MRI. Findings consistent with infection Upper chest: Lung apices clear bilaterally Other: None IMPRESSION: 1. Prevertebral soft tissue swelling. Progressive epidural thickening at C1 and C2 with central low density suggesting epidural abscess. This is causing cord flattening and mild spinal stenosis. Recommend repeat MRI cervical spine without and with contrast. The prior MRI was degraded by significant motion. Recommend appropriate sedation prior to MRI. 2. No evidence of discitis or osteomyelitis in the cervical spine. Electronically Signed: By: Franchot Gallo M.D. On: 11/12/2021 12:16   CT Angio Neck W and/or Wo Contrast  Result Date: 11/02/2021 CLINICAL DATA:  Headache with severe neck pain. EXAM: CT ANGIOGRAPHY HEAD AND NECK TECHNIQUE: Multidetector CT imaging of the head and neck was performed using the standard protocol during bolus administration of intravenous contrast. Multiplanar CT image reconstructions and MIPs were obtained to evaluate the vascular anatomy. Carotid stenosis measurements (when applicable) are obtained utilizing NASCET criteria, using the distal internal  carotid diameter as the denominator. CONTRAST:  76m OMNIPAQUE IOHEXOL 350 MG/ML SOLN COMPARISON:  None. FINDINGS: CT HEAD FINDINGS Brain: No evidence of acute infarction, hemorrhage, hydrocephalus, extra-axial collection or mass lesion/mass effect. Mild chronic small vessel disease in the hemispheric white matter. Chronic lacune at the right caudate head. Vascular: See below Skull: Negative Sinuses: Negative Orbits: Negative Review of the MIP images confirms the above findings CTA NECK FINDINGS Aortic arch: Normal Right carotid system: Vessels are smooth and widely patent Left carotid system: Vessels are smooth and widely patent. Minimal atheromatous changes. Vertebral arteries: No proximal subclavian stenosis. Both vertebral arteries are smoothly contoured and widely patent. Skeleton: Subtle but convincing retropharyngeal/prevertebral fat haziness. No associated muscular calcification or focal endplate erosion. The cervical spine is diffusely degenerated. No evidence of mucosal inflammation the level of the throat. Other neck: As above Upper chest: Negative Review of the MIP images confirms the above findings CTA HEAD FINDINGS Anterior circulation: No significant stenosis, proximal occlusion, aneurysm, or vascular malformation. Posterior circulation: Vessels are smooth and diffusely patent. Negative for aneurysm or vascular malformation. Venous sinuses: Unremarkable Anatomic variants: No significant variant. Review of the MIP images confirms the above findings IMPRESSION: 1. Retropharyngeal edema without visible pharyngeal or spinal source, suggest enhanced cervical MRI and inflammatory labs. 2. Mild for age atherosclerosis. Electronically Signed   By: JJorje GuildM.D.   On: 11/02/2021 12:02   MR BRAIN W WO CONTRAST  Result Date: 11/03/2021 CLINICAL DATA:  Meningitis/CNS infection suspected. EXAM: MRI HEAD WITHOUT AND WITH CONTRAST TECHNIQUE: Multiplanar, multiecho pulse sequences of the brain and  surrounding structures were obtained without and with intravenous contrast. CONTRAST:  157mGADAVIST GADOBUTROL 1 MMOL/ML IV SOLN COMPARISON:  Head and neck CTA 11/02/2021 FINDINGS: The study is motion degraded including severe motion artifact on postcontrast and susceptibility weighted imaging. Brain: There is no evidence of an acute infarct, intracranial hemorrhage, mass, midline shift, or extra-axial fluid collection. T2 hyperintensities in the cerebral white matter and pons are nonspecific but compatible with mildly to moderately age advanced chronic small vessel ischemic disease. The ventricles and sulci are within normal limits for age. No gross abnormal intracranial enhancement is identified although assessment is severely limited by motion. No subarachnoid space debris is evident on FLAIR or diffusion weighted imaging. Vascular: Major intracranial vascular flow voids are preserved. Skull and upper cervical  spine: Unremarkable bone marrow signal. Sinuses/Orbits: Unremarkable orbits. Paranasal sinuses and mastoid air cells are clear. Other: None. IMPRESSION: 1. Severely motion degraded examination without evidence of acute intracranial abnormality. 2. Mild-to-moderate chronic small vessel ischemic disease. Electronically Signed   By: Logan Bores M.D.   On: 11/03/2021 10:43   MR CERVICAL SPINE W WO CONTRAST  Result Date: 11/12/2021 CLINICAL DATA:  Acute neck pain with rotation and headache. Infection suspected. Concern of epidural abscess on CT neck. History of prostate cancer. EXAM: MRI CERVICAL SPINE WITHOUT AND WITH CONTRAST TECHNIQUE: Multiplanar and multiecho pulse sequences of the cervical spine, to include the craniocervical junction and cervicothoracic junction, were obtained without and with intravenous contrast. CONTRAST:  74m GADAVIST GADOBUTROL 1 MMOL/ML IV SOLN COMPARISON:  MRI cervical spine 11/03/2021.  Neck CT 11/12/2021. FINDINGS: Despite efforts by the technologist and patient, mild  motion artifact is present on today's exam and could not be eliminated. This reduces exam sensitivity and specificity. Alignment: Straightening without focal angulation or listhesis. Vertebrae: There is new marrow edema within the C2 vertebral body and odontoid process as well as the anterior arch of C1. In correlation with recent prior imaging, no definite fracture or cortical destruction identified in these areas. Stable endplate degenerative changes elsewhere in the cervical spine. Cord: Normal in signal and caliber. Posterior Fossa, vertebral arteries, paraspinal tissues: Diffuse prevertebral/retropharyngeal soft tissue swelling again noted extending from the skull base to C5, similar to the original MRI. As seen on recent neck CT, there is increased ventral epidural soft tissue thickening and/or complex fluid at C1-2. This partially effaces the CSF surrounding the cord. No other epidural fluid collections are seen. Bilateral vertebral artery flow voids. Disc levels: Assessment of the disc space levels is limited by motion. As above, there is effacement of the CSF surrounding the cord at C1-2 by probable ventral epidural inflammatory changes. No cord deformity. C2-3: The disc appears normal. Asymmetric facet hypertrophy on the right without significant foraminal narrowing. C3-4: Spondylosis with loss of disc height, uncinate spurring and facet hypertrophy asymmetric to the left. No spinal stenosis or nerve root encroachment C4-5: Spondylosis with loss of disc height, uncinate spurring and asymmetric facet hypertrophy on the left. No cord deformity. Mild left foraminal narrowing. C5-6: Spondylosis with loss of disc height and bilateral uncinate spurring contributing to mild foraminal narrowing bilaterally. No cord deformity. C6-7: Spondylosis with loss of disc height and bilateral uncinate spurring. Mild foraminal narrowing bilaterally. No cord deformity. C7-T1: Spondylosis with loss of disc height and posterior  osteophytes. No cord deformity. IMPRESSION: 1. As seen on neck CT earlier today, there is progressive anterior epidural soft tissue thickening or complex fluid at C1-2 associated with new marrow edema in the C1 and C2 vertebral bodies, suspicious for osteomyelitis and epidural abscess formation. The prevertebral/retropharyngeal soft tissue swelling is similar to the previous MRI of 9 days ago. 2. No cord compression or abnormal cord signal. 3. Multilevel cervical spondylosis without cord deformity or high-grade foraminal narrowing. 4. Lumbar spine findings dictated separately. Electronically Signed   By: WRichardean SaleM.D.   On: 11/12/2021 15:08   MR Cervical Spine W or Wo Contrast  Result Date: 11/03/2021 CLINICAL DATA:  Retropharyngeal edema. Meningitis/CNS infection suspected. EXAM: MRI CERVICAL, THORACIC AND LUMBAR SPINE WITHOUT AND WITH CONTRAST TECHNIQUE: Multiplanar and multiecho pulse sequences of the cervical spine, to include the craniocervical junction and cervicothoracic junction, and thoracic and lumbar spine, were obtained without and with intravenous contrast. CONTRAST:  160mGADAVIST GADOBUTROL 1  MMOL/ML IV SOLN COMPARISON:  Retropharyngeal edema. FINDINGS: MRI CERVICAL SPINE FINDINGS Alignment: No significant listhesis is present. Mild straightening of the normal cervical lordosis is noted. Vertebrae: Chronic fatty endplate marrow changes are noted from C3-4 through C7-T1. Vertebral body heights are maintained. Cord: Normal signal and morphology. Posterior Fossa, vertebral arteries, paraspinal tissues: Prevertebral edema extends from the skull base through C5-6. Peripheral postcontrast enhancement of the collection is noted. Postcontrast images are moderately degraded by patient motion. No definite intracanalicular enhancement is present in the cervical spine. Disc levels: Axial images were not obtained in the cervical spine. Multilevel disc disease is present in the cervical spine with  effacement of the ventral CSF at C3-4, C4-5, C5-6, and C6-7. Foraminal disease is greatest at C5-6 on the left and C4-5 on the right. MRI THORACIC SPINE FINDINGS Alignment: No significant listhesis is present in the thoracic spine. Straightening of the normal kyphosis is noted. Vertebrae: Fatty endplate marrow changes present at T6-7. Marrow signal and vertebral body heights normal. Cord: Normal signal and morphology. No significant intracanalicular enhancement is present in the thoracic spine. Paraspinal and other soft tissues: Limited imaging the abdomen is unremarkable. There is no significant adenopathy. No solid organ lesions are present. Disc levels: Mild disc bulging is present at T5-6 and T6-7 without significant stenosis. Mild foraminal narrowing is present at T7-8, T8-9 and T9-10, right greater than left, secondary to facet spurring. MRI LUMBAR SPINE FINDINGS Segmentation: 5 non rib-bearing lumbar type vertebral bodies are present. The lowest fully formed vertebral body is L5. Alignment: No significant listhesis is present. Straightening of the normal lumbar lordosis is noted. Vertebrae: Study is moderately degraded by patient motion. Chronic fatty endplate marrow changes are noted anteriorly at L3-4. Edematous changes are present anteriorly at L4-5 with some enhancement of the endplates and fluid in the disc space at L4-5. Conus medullaris and cauda equina: Conus extends to the L1 level. Conus and cauda equina appear normal. Paraspinal and other soft tissues: Limited imaging the abdomen is unremarkable. There is no significant adenopathy. No solid organ lesions are present. Disc levels: T12-L1: Negative. L1-2: Negative. L2-3: Mild epidural enhancement is noted, anteriorly more posteriorly. Broad-based disc protrusion extends into the foramina bilaterally with moderate foraminal narrowing bilaterally. L3-4: Broad-based disc protrusion is present. Moderate facet hypertrophy is noted bilaterally. Moderate  central bilateral foraminal stenosis is present. Diffuse epidural enhancement is present. No discrete epidural abscess is present. L4-5: Fluid is present in the disc space. Enhancement is present disc space and endplates. Extensive epidural enhancement is present. Central and foraminal stenosis present. L5-S1: Loss of disc height is present. Diffuse epidural enhancement present. Central disc protrusion contributes to mild central canal stenosis. Facet spurring contributes to mild foraminal narrowing bilaterally. IMPRESSION: 1. Fluid and enhancement within the disc space at L4-5 with adjacent edema and enhancement of the endplates. 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 2. Extensive epidural enhancement in the lower lumbar spine L2-3 through the sacrum compatible with infection. Recommend lumbar puncture further evaluation. 3. Prevertebral edema with peripheral postcontrast enhancement extending from the skull base through C5-6. Focal source for infection in the upper cervical spine not identified. 4. The study is moderately degraded by patient motion. 5. 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 6. Diffuse epidural enhancement at L2-3, L3-4, L4-5, and L5-S1 without discrete epidural abscess. 7. Moderate central canal and bilateral foraminal stenosis at L3-4. 8. Mild foraminal narrowing bilaterally at T7-8, T8-9 and T9-10 secondary to facet spurring. These results will be called to  the ordering clinician or representative by the Radiologist Assistant, and communication documented in the PACS or [employer]. Electronically Signed   By: San Morelle M.D.   On: 11/03/2021 10:55   MR THORACIC SPINE W WO CONTRAST  Result Date: 11/03/2021 CLINICAL DATA:  Retropharyngeal edema. Meningitis/CNS infection suspected. EXAM: MRI CERVICAL, THORACIC AND LUMBAR SPINE WITHOUT AND WITH CONTRAST TECHNIQUE: Multiplanar and multiecho pulse sequences of the cervical spine, to include the  craniocervical junction and cervicothoracic junction, and thoracic and lumbar spine, were obtained without and with intravenous contrast. CONTRAST:  2m GADAVIST GADOBUTROL 1 MMOL/ML IV SOLN COMPARISON:  Retropharyngeal edema. FINDINGS: MRI CERVICAL SPINE FINDINGS Alignment: No significant listhesis is present. Mild straightening of the normal cervical lordosis is noted. Vertebrae: Chronic fatty endplate marrow changes are noted from C3-4 through C7-T1. Vertebral body heights are maintained. Cord: Normal signal and morphology. Posterior Fossa, vertebral arteries, paraspinal tissues: Prevertebral edema extends from the skull base through C5-6. Peripheral postcontrast enhancement of the collection is noted. Postcontrast images are moderately degraded by patient motion. No definite intracanalicular enhancement is present in the cervical spine. Disc levels: Axial images were not obtained in the cervical spine. Multilevel disc disease is present in the cervical spine with effacement of the ventral CSF at C3-4, C4-5, C5-6, and C6-7. Foraminal disease is greatest at C5-6 on the left and C4-5 on the right. MRI THORACIC SPINE FINDINGS Alignment: No significant listhesis is present in the thoracic spine. Straightening of the normal kyphosis is noted. Vertebrae: Fatty endplate marrow changes present at T6-7. Marrow signal and vertebral body heights normal. Cord: Normal signal and morphology. No significant intracanalicular enhancement is present in the thoracic spine. Paraspinal and other soft tissues: Limited imaging the abdomen is unremarkable. There is no significant adenopathy. No solid organ lesions are present. Disc levels: Mild disc bulging is present at T5-6 and T6-7 without significant stenosis. Mild foraminal narrowing is present at T7-8, T8-9 and T9-10, right greater than left, secondary to facet spurring. MRI LUMBAR SPINE FINDINGS Segmentation: 5 non rib-bearing lumbar type vertebral bodies are present. The lowest  fully formed vertebral body is L5. Alignment: No significant listhesis is present. Straightening of the normal lumbar lordosis is noted. Vertebrae: Study is moderately degraded by patient motion. Chronic fatty endplate marrow changes are noted anteriorly at L3-4. Edematous changes are present anteriorly at L4-5 with some enhancement of the endplates and fluid in the disc space at L4-5. Conus medullaris and cauda equina: Conus extends to the L1 level. Conus and cauda equina appear normal. Paraspinal and other soft tissues: Limited imaging the abdomen is unremarkable. There is no significant adenopathy. No solid organ lesions are present. Disc levels: T12-L1: Negative. L1-2: Negative. L2-3: Mild epidural enhancement is noted, anteriorly more posteriorly. Broad-based disc protrusion extends into the foramina bilaterally with moderate foraminal narrowing bilaterally. L3-4: Broad-based disc protrusion is present. Moderate facet hypertrophy is noted bilaterally. Moderate central bilateral foraminal stenosis is present. Diffuse epidural enhancement is present. No discrete epidural abscess is present. L4-5: Fluid is present in the disc space. Enhancement is present disc space and endplates. Extensive epidural enhancement is present. Central and foraminal stenosis present. L5-S1: Loss of disc height is present. Diffuse epidural enhancement present. Central disc protrusion contributes to mild central canal stenosis. Facet spurring contributes to mild foraminal narrowing bilaterally. IMPRESSION: 1. Fluid and enhancement within the disc space at L4-5 with adjacent edema and enhancement of the endplates. 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 2. Extensive epidural enhancement in the lower lumbar spine  L2-3 through the sacrum compatible with infection. Recommend lumbar puncture further evaluation. 3. Prevertebral edema with peripheral postcontrast enhancement extending from the skull base through C5-6. Focal source  for infection in the upper cervical spine not identified. 4. The study is moderately degraded by patient motion. 5. 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 6. Diffuse epidural enhancement at L2-3, L3-4, L4-5, and L5-S1 without discrete epidural abscess. 7. Moderate central canal and bilateral foraminal stenosis at L3-4. 8. Mild foraminal narrowing bilaterally at T7-8, T8-9 and T9-10 secondary to facet spurring. These results will be called to the ordering clinician or representative by the Radiologist Assistant, and communication documented in the PACS or [employer]. Electronically Signed   By: San Morelle M.D.   On: 11/03/2021 10:55   MR Lumbar Spine W Wo Contrast  Result Date: 11/12/2021 CLINICAL DATA:  Acute neck pain with rotation. Headache. Abnormal lumbar MRI suggesting discitis at L4-5. EXAM: MRI LUMBAR SPINE WITHOUT AND WITH CONTRAST TECHNIQUE: Multiplanar and multiecho pulse sequences of the lumbar spine were obtained without and with intravenous contrast. CONTRAST:  66m GADAVIST GADOBUTROL 1 MMOL/ML IV SOLN COMPARISON:  Lumbar MRI 11/03/2021.  Abdominopelvic CT 11/05/2021. FINDINGS: Segmentation: Conventional anatomy assumed, with the last open disc space designated L5-S1. Alignment: Stable. There is straightening with a minimal degenerative anterolisthesis at L5-S1. Vertebrae: Although comparison is limited due to motion on previous MRI, there is evidence of increased bone marrow edema throughout the L4 and L5 vertebral bodies on the inversion recovery images, highly suspicious for osteomyelitis. There is associated progressive endplate irregularity and diffuse discal T2 hyperintensity. There are predominately fatty endplate degenerative changes at L3-4, without definite signs of discitis at that level. Possible findings of osteomyelitis adjacent to the right L5-S1 facet joint. The visualized sacroiliac joints appear unremarkable. Conus medullaris: Extends to the L1  level and appears normal. Paraspinal and other soft tissues: Mild anterior paraspinal inflammatory changes at L4-5. No focal anterior fluid collection identified. There is a fluid collection projecting posteriorly from the right L5-S1 facet joint, further described below. Disc levels: No significant disc space findings at T12-L1 or L1-2. L2-3: Mild disc bulging and bilateral facet hypertrophy. No significant spinal stenosis or nerve root encroachment. L3-4: Loss of disc height with annular disc bulging and endplate osteophytes. Mild facet and ligamentous hypertrophy. Resulting mild spinal stenosis with mild lateral recess and foraminal narrowing bilaterally. L4-5: As above, progressive findings of discitis and osteomyelitis at this level. There is progressive annular disc bulging with ventral epidural soft tissue thickening and increased mass effect on the thecal sac. Moderate facet and ligamentous hypertrophy without definite evidence of facet joint infection. L5-S1: Chronic degenerative disc disease with annular disc bulging and a broad-based disc protrusion. There are marrow changes within the right L5-S1 facet joint with an enlarging posteriorly projecting fluid collection, measuring 1.9 cm on image 4/6. Underlying mild to moderate foraminal narrowing bilaterally, grossly stable. IMPRESSION: 1. Progressive changes of diskitis and osteomyelitis at L4-5 with ventral epidural thickening and increased mass effect on the thecal sac. 2. Possible changes of osteomyelitis involving the right L5-S1 facet joint with a posteriorly projecting small abscess or synovial cyst. 3. Stable endplate degenerative changes at L3-4 without definite signs of discitis or osteomyelitis. 4. Multilevel spondylosis as detailed above. Electronically Signed   By: WRichardean SaleM.D.   On: 11/12/2021 15:22   MR Lumbar Spine W Wo Contrast  Result Date: 11/03/2021 CLINICAL DATA:  Retropharyngeal edema. Meningitis/CNS infection suspected.  EXAM: MRI CERVICAL, THORACIC  AND LUMBAR SPINE WITHOUT AND WITH CONTRAST TECHNIQUE: Multiplanar and multiecho pulse sequences of the cervical spine, to include the craniocervical junction and cervicothoracic junction, and thoracic and lumbar spine, were obtained without and with intravenous contrast. CONTRAST:  33m GADAVIST GADOBUTROL 1 MMOL/ML IV SOLN COMPARISON:  Retropharyngeal edema. FINDINGS: MRI CERVICAL SPINE FINDINGS Alignment: No significant listhesis is present. Mild straightening of the normal cervical lordosis is noted. Vertebrae: Chronic fatty endplate marrow changes are noted from C3-4 through C7-T1. Vertebral body heights are maintained. Cord: Normal signal and morphology. Posterior Fossa, vertebral arteries, paraspinal tissues: Prevertebral edema extends from the skull base through C5-6. Peripheral postcontrast enhancement of the collection is noted. Postcontrast images are moderately degraded by patient motion. No definite intracanalicular enhancement is present in the cervical spine. Disc levels: Axial images were not obtained in the cervical spine. Multilevel disc disease is present in the cervical spine with effacement of the ventral CSF at C3-4, C4-5, C5-6, and C6-7. Foraminal disease is greatest at C5-6 on the left and C4-5 on the right. MRI THORACIC SPINE FINDINGS Alignment: No significant listhesis is present in the thoracic spine. Straightening of the normal kyphosis is noted. Vertebrae: Fatty endplate marrow changes present at T6-7. Marrow signal and vertebral body heights normal. Cord: Normal signal and morphology. No significant intracanalicular enhancement is present in the thoracic spine. Paraspinal and other soft tissues: Limited imaging the abdomen is unremarkable. There is no significant adenopathy. No solid organ lesions are present. Disc levels: Mild disc bulging is present at T5-6 and T6-7 without significant stenosis. Mild foraminal narrowing is present at T7-8, T8-9 and T9-10,  right greater than left, secondary to facet spurring. MRI LUMBAR SPINE FINDINGS Segmentation: 5 non rib-bearing lumbar type vertebral bodies are present. The lowest fully formed vertebral body is L5. Alignment: No significant listhesis is present. Straightening of the normal lumbar lordosis is noted. Vertebrae: Study is moderately degraded by patient motion. Chronic fatty endplate marrow changes are noted anteriorly at L3-4. Edematous changes are present anteriorly at L4-5 with some enhancement of the endplates and fluid in the disc space at L4-5. Conus medullaris and cauda equina: Conus extends to the L1 level. Conus and cauda equina appear normal. Paraspinal and other soft tissues: Limited imaging the abdomen is unremarkable. There is no significant adenopathy. No solid organ lesions are present. Disc levels: T12-L1: Negative. L1-2: Negative. L2-3: Mild epidural enhancement is noted, anteriorly more posteriorly. Broad-based disc protrusion extends into the foramina bilaterally with moderate foraminal narrowing bilaterally. L3-4: Broad-based disc protrusion is present. Moderate facet hypertrophy is noted bilaterally. Moderate central bilateral foraminal stenosis is present. Diffuse epidural enhancement is present. No discrete epidural abscess is present. L4-5: Fluid is present in the disc space. Enhancement is present disc space and endplates. Extensive epidural enhancement is present. Central and foraminal stenosis present. L5-S1: Loss of disc height is present. Diffuse epidural enhancement present. Central disc protrusion contributes to mild central canal stenosis. Facet spurring contributes to mild foraminal narrowing bilaterally. IMPRESSION: 1. Fluid and enhancement within the disc space at L4-5 with adjacent edema and enhancement of the endplates. 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 2. Extensive epidural enhancement in the lower lumbar spine L2-3 through the sacrum compatible with infection.  Recommend lumbar puncture further evaluation. 3. Prevertebral edema with peripheral postcontrast enhancement extending from the skull base through C5-6. Focal source for infection in the upper cervical spine not identified. 4. The study is moderately degraded by patient motion. 5. 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 6.  Diffuse epidural enhancement at L2-3, L3-4, L4-5, and L5-S1 without discrete epidural abscess. 7. Moderate central canal and bilateral foraminal stenosis at L3-4. 8. Mild foraminal narrowing bilaterally at T7-8, T8-9 and T9-10 secondary to facet spurring. These results will be called to the ordering clinician or representative by the Radiologist Assistant, and communication documented in the PACS or [employer]. Electronically Signed   By: San Morelle M.D.   On: 11/03/2021 10:55   CT ABDOMEN PELVIS W CONTRAST  Result Date: 11/05/2021 CLINICAL DATA:  Acute abdominal pain. EXAM: CT ABDOMEN AND PELVIS WITH CONTRAST TECHNIQUE: Multidetector CT imaging of the abdomen and pelvis was performed using the standard protocol following bolus administration of intravenous contrast. CONTRAST:  6m OMNIPAQUE IOHEXOL 350 MG/ML SOLN COMPARISON:  None. FINDINGS: Lower chest: Clear. Hepatobiliary: Gallbladder sludge is present. No calculi are identified. Liver and bile ducts are within normal limits. Pancreas: Unremarkable. No pancreatic ductal dilatation or surrounding inflammatory changes. Spleen: Normal in size without focal abnormality. Adrenals/Urinary Tract: Adrenal glands are unremarkable. Kidneys are normal, without renal calculi, focal lesion, or hydronephrosis. Bladder is unremarkable. Stomach/Bowel: There is a small hiatal hernia. Stomach is otherwise within normal limits. Appendix appears normal. No evidence of bowel wall thickening, distention, or inflammatory changes. There is sigmoid and descending colon diverticulosis without evidence for acute diverticulitis.  Vascular/Lymphatic: Aortic atherosclerosis. No enlarged abdominal or pelvic lymph nodes. Reproductive: Prostate is unremarkable. Other: There is no ascites or free air. There is no significant abdominal wall hernia. There is mild presacral edema. Injection site noted in the subcutaneous tissues of the right anterior abdominal wall. Musculoskeletal: No acute fracture identified. There is disc space narrowing and endplate sclerosis this cystic change at L3-L4 and L4-L5. There is mild stranding and small lymph nodes in the anterior paravertebral soft tissues at this level. No fluid collections are identified. IMPRESSION: 1. Presumably degenerative endplate changes at LC1-E7and L4-L5. There are mild inflammatory changes and small lymph nodes in the anterior paravertebral soft tissues at this level. If there is high clinical concern for infection, recommend MRI. 2. Gallbladder sludge. 3. Colonic diverticulosis without evidence for acute diverticulitis. Electronically Signed   By: ARonney AstersM.D.   On: 11/05/2021 15:40   DG Chest Portable 1 View  Result Date: 11/02/2021 CLINICAL DATA:  Cough EXAM: PORTABLE CHEST 1 VIEW COMPARISON:  Chest x-ray dated October 26, 2021 FINDINGS: Cardiac and mediastinal contours within normal limits. Slightly decreased bilateral patchy airspace disease. No large pleural effusion or pneumothorax. IMPRESSION: Bilateral patchy airspace disease is likely slightly decreased compared with prior exam, although evaluation is somewhat limited due to differences in technique. Recommend follow-up PA and lateral chest x-ray in 6-8 weeks to ensure complete resolution. Electronically Signed   By: LYetta GlassmanM.D.   On: 11/02/2021 09:43   ECHOCARDIOGRAM COMPLETE  Result Date: 11/03/2021    ECHOCARDIOGRAM REPORT   [facility]: 11[facility]20[facility] Medical Rec #:  [id number]                 Height:       72.0 in Accession #:    [id number]                 Weight:       250.0 lb Date of Birth:  [birth year]                  BSA:  2.343 m Patient Age:    70 years                   BP:           165/107 mmHg Patient Gender: M                          HR:           108 bpm. Exam Location:  Inpatient Procedure: 2D Echo, Cardiac Doppler, Color Doppler and Intracardiac            Opacification Agent Indications:    Bacteremia  History:        Patient has no prior history of Echocardiogram examinations.                 Signs/Symptoms:Bacteremia, Shortness of Breath and Dyspnea.                 Hypoxia. FLU.  Sonographer:    Roseanna Rainbow RDCS Referring Phys: [id number] [facility] T VU  Sonographer Comments: Technically difficult study due to poor echo windows and patient is morbidly obese. Image acquisition challenging due to patient body habitus. IMPRESSIONS  1. Left ventricular ejection fraction, by estimation, is 55 to 60%. The left ventricle has normal function. The left ventricle has no regional wall motion abnormalities. There is mild concentric left ventricular hypertrophy. Left ventricular diastolic parameters are consistent with Grade I diastolic dysfunction (impaired relaxation).  2. Right ventricular systolic function is normal. The right ventricular size is normal.  3. The mitral valve is normal in structure. No evidence of mitral valve regurgitation. No evidence of mitral stenosis.  4. The aortic valve is normal in structure. Aortic valve regurgitation is not visualized. No aortic stenosis is present.  5. There is borderline dilatation of the ascending aorta, measuring 36 mm.  6. The inferior vena cava is normal in size with greater than 50% respiratory variability, suggesting right atrial pressure of 3 mmHg. Comparison(s): No prior Echocardiogram. FINDINGS  Left Ventricle: Left ventricular ejection fraction, by estimation, is 55 to 60%. The left ventricle has normal function. The left ventricle has no regional wall motion abnormalities. Definity contrast agent was  given IV to delineate the left ventricular  endocardial borders. The left ventricular internal cavity size was normal in size. There is mild concentric left ventricular hypertrophy. Left ventricular diastolic parameters are consistent with Grade I diastolic dysfunction (impaired relaxation). Right Ventricle: The right ventricular size is normal. No increase in right ventricular wall thickness. Right ventricular systolic function is normal. Left Atrium: Left atrial size was normal in size. Right Atrium: Right atrial size was normal in size. Pericardium: There is no evidence of pericardial effusion. Presence of epicardial fat layer. Mitral Valve: The mitral valve is normal in structure. No evidence of mitral valve regurgitation. No evidence of mitral valve stenosis. MV peak gradient, 11.7 mmHg. The mean mitral valve gradient is 5.0 mmHg. Tricuspid Valve: The tricuspid valve is normal in structure. Tricuspid valve regurgitation is not demonstrated. No evidence of tricuspid stenosis. Aortic Valve: The aortic valve is normal in structure. Aortic valve regurgitation is not visualized. No aortic stenosis is present. Pulmonic Valve: The pulmonic valve was normal in structure. Pulmonic valve regurgitation is not visualized. No evidence of pulmonic stenosis. Aorta: There is borderline dilatation of the ascending aorta, measuring 36 mm. Venous: The inferior vena cava is normal in size with greater than 50% respiratory variability, suggesting right atrial pressure  of 3 mmHg. IAS/Shunts: No atrial level shunt detected by color flow Doppler.  LEFT VENTRICLE PLAX 2D LVIDd:         4.50 cm      Diastology LVIDs:         3.60 cm      LV e' medial:    19.00 cm/s LV PW:         1.07 cm      LV E/e' medial:  8.2 LV IVS:        1.04 cm      LV e' lateral:   17.90 cm/s LVOT diam:     2.10 cm      LV E/e' lateral: 8.7 LV SV:         95 LV SV Index:   40 LVOT Area:     3.46 cm  LV Volumes (MOD) LV vol d, MOD A2C: 104.2 ml LV vol d, MOD  A4C: 112.0 ml LV vol s, MOD A2C: 45.8 ml LV vol s, MOD A4C: 54.9 ml LV SV MOD A2C:     58.3 ml LV SV MOD A4C:     112.0 ml LV SV MOD BP:      57.0 ml RIGHT VENTRICLE            IVC RV S prime:     9.36 cm/s  IVC diam: 2.60 cm TAPSE (M-mode): 1.4 cm LEFT ATRIUM             Index        RIGHT ATRIUM           Index LA diam:        3.20 cm 1.37 cm/m   RA Area:     11.50 cm LA Vol (A2C):   73.4 ml 31.33 ml/m  RA Volume:   24.70 ml  10.54 ml/m LA Vol (A4C):   26.5 ml 11.31 ml/m LA Biplane Vol: 45.0 ml 19.21 ml/m  AORTIC VALVE LVOT Vmax:   141.00 cm/s LVOT Vmean:  94.600 cm/s LVOT VTI:    0.273 m  AORTA Ao Root diam: 3.80 cm Ao Asc diam:  3.60 cm MITRAL VALVE MV Area (PHT): 3.60 cm     SHUNTS MV Area VTI:   3.25 cm     Systemic VTI:  0.27 m MV Peak grad:  11.7 mmHg    Systemic Diam: 2.10 cm MV Mean grad:  5.0 mmHg MV Vmax:       1.71 m/s MV Vmean:      101.0 cm/s MV Decel Time: 211 msec MV E velocity: 155.00 cm/s Godfrey Pick Tobb DO Electronically signed by Berniece Salines DO Signature Date/Time: 11/03/2021/12:08:04 PM    Final      The results of significant diagnostics from this hospitalization (including imaging, microbiology, ancillary and laboratory) are listed below for reference.     Microbiology: No results found for this or any previous visit (from the past 240 hour(s)).   Labs: BNP (last 3 results) Recent Labs    10/24/21 0052  BNP 03.5   Basic Metabolic Panel: Recent Labs  Lab 11/18/21 0000 11/20/21 0420 11/21/21 0419 11/24/21 0019  NA 131* 129* 131* 129*  K 4.1 4.0 4.0 3.7  CL 97* 97* 97* 95*  CO2 _0 GLUCOSE 119* 114* 123* 126*  BUN 26* _1 CREATININE 0.97 0.74 0.67 0.69  CALCIUM 9.0 8.6* 8.6* 8.5*  MG 2.1 1.7 1.7 1.7  PHOS  --  3.9  --   --  Liver Function Tests: Recent Labs  Lab 11/20/21 0420  AST 24  ALT 40  ALKPHOS 69  BILITOT 0.5  PROT 7.3  ALBUMIN 2.3*   No results for input(s): LIPASE, AMYLASE in the last 168 hours. No results for input(s):  AMMONIA in the last 168 hours. CBC: Recent Labs  Lab 11/18/21 0000 11/20/21 0420 11/21/21 0419 11/24/21 0019  WBC 4.1 3.8* 2.9* 2.9*  NEUTROABS  --  2.5  --   --   HGB 9.3* 9.0* 8.9* 9.3*  HCT 28.2* 27.2* 26.7* 27.9*  MCV 93.7 92.8 91.8 91.8  PLT 286 264 274 229   Cardiac Enzymes: No results for input(s): CKTOTAL, CKMB, CKMBINDEX, TROPONINI in the last 168 hours. BNP: Invalid input(s): POCBNP CBG: No results for input(s): GLUCAP in the last 168 hours. D-Dimer No results for input(s): DDIMER in the last 72 hours. Hgb A1c No results for input(s): HGBA1C in the last 72 hours. Lipid Profile No results for input(s): CHOL, HDL, LDLCALC, TRIG, CHOLHDL, LDLDIRECT in the last 72 hours. Thyroid function studies No results for input(s): TSH, T4TOTAL, T3FREE, THYROIDAB in the last 72 hours.  Invalid input(s): FREET3 Anemia work up No results for input(s): VITAMINB12, FOLATE, FERRITIN, TIBC, IRON, RETICCTPCT in the last 72 hours. Urinalysis    Component Value Date/Time   COLORURINE YELLOW 11/02/2021 1633   APPEARANCEUR HAZY (A) 11/02/2021 1633   LABSPEC 1.004 (L) 11/02/2021 1633   PHURINE 6.0 11/02/2021 1633   GLUCOSEU NEGATIVE 11/02/2021 1633   HGBUR SMALL (A) 11/02/2021 1633   BILIRUBINUR NEGATIVE 11/02/2021 1633   KETONESUR NEGATIVE 11/02/2021 1633   PROTEINUR 100 (A) 11/02/2021 1633   NITRITE POSITIVE (A) 11/02/2021 1633   LEUKOCYTESUR NEGATIVE 11/02/2021 1633   Sepsis Labs Invalid input(s): PROCALCITONIN,  WBC,  LACTICIDVEN Microbiology No results found for this or any previous visit (from the past 240 hour(s)).   Time coordinating discharge:  I have spent 35 minutes face to face with the patient and on the ward discussing the patients care, assessment, plan and disposition with other care givers. >50% of the time was devoted counseling the patient about the risks and benefits of treatment/Discharge disposition and coordinating care.   SIGNED:   Damita Lack,  MD  [facility] 11/24/2021, 1:37 PM   If 7PM-7AM, please contact night-coverage

## 2021-11-24 NOTE — Progress Notes (Signed)
PROGRESS NOTE    [facility].  ION:[id number] DOB: February 09, 1951 DOA: 11/02/2021 PCP: System, Provider Not In   Brief Narrative:  70 year old with history of asthma, HTN comes to the hospital complains of neck pain and headache.  He was found to have MRSA bacteremia therefore infectious disease was consulted.  He was started on vancomycin.  Work-up revealed cervical epidural.  Repeat CT and MRI showed progression of osteomyelitis/discitis and epidural abscess in the lumbar and cervical region.  No cord compression was noted, neurosurgery was consulted.  He was recommended to continue medical management at this time.  PT to continue working with the patient.   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  ID following.  Planning to discharge on vancomycin for the ease by SNF pharmacy - 2D echo is overall unremarkable - ID is following.  There is definite progression of epidural abscess in the cervical region and discitis/osteomyelitis in the lumbar/sacral region.  Neurosurgery recommends medical management at this time.  Holding off on MRI thoracic spine.  Cardiology decided TEE towards the end of the patient's treatment -Pain control  Retropharyngeal edema - Seen by ENT.  IV antibiotics. - CT neck with contrast-showed worsening of cervical spine abscess.    Abdominal Pain -Resolved   Acute Encephalopathy with  Agitation, improved -This is fluctuating.  He is on bedtime 25 mg of Seroquel.   Hypertension -Patient is on losartan 50 mg daily.  IV as needed's as necessary.   Hyperlipidemia -Resume statin when able     Asthma -As needed bronchodilators   Hyperbilirubinemia -Resolved   Normocytic Anemia --Anemia of chronic disease.  Continue to closely monitor.   Obesity -Weight loss and outpatient dietary counseling   DVT prophylaxis: Lovenox Code Status: DNR Family Communication:    Status is: Inpatient  Remains inpatient appropriate because: TOC working on placement  Subjective: Still having lower back pain but continues to improve very slowly daily.  Review of Systems Otherwise negative except as per HPI, including: General: Denies fever, chills, night sweats or unintended weight loss. Resp: Denies cough, wheezing, shortness of breath. Cardiac: Denies chest pain, palpitations, orthopnea, paroxysmal nocturnal dyspnea. GI: Denies abdominal pain, nausea, vomiting, diarrhea or constipation GU: Denies dysuria, frequency, hesitancy or incontinence MS: Denies muscle aches, joint pain or swelling Neuro: Denies headache, neurologic deficits (focal weakness, numbness, tingling), abnormal gait Psych: Denies anxiety, depression, SI/HI/AVH Skin: Denies new rashes or lesions ID: Denies sick contacts, exotic exposures, travel  Examination: Constitutional: Not in acute distress Respiratory: Clear to auscultation bilaterally Cardiovascular: Normal sinus rhythm, no rubs Abdomen: Nontender nondistended good bowel sounds Musculoskeletal: No edema noted Skin: No rashes seen Neurologic: CN 2-12 grossly intact.  And nonfocal Psychiatric: Normal judgment and insight. Alert and oriented x 3. Normal mood. Midline in place.   Objective: Vitals:   11/23/21 2144 11/24/21 0558 11/24/21 0902 11/24/21 0904  BP: 128/79 129/70  Pulse: 76 75    Resp: 15 20    Temp: 99.2 F (37.3 C) 97.7 F (36.5  C)    TempSrc: Oral Oral    SpO2: 96% 97% 97% 97%  Weight:      Height:        Intake/Output Summary (Last 24 hours) at 11/24/2021 1324 Last data filed at 11/24/2021 1226 Gross per 24 hour  Intake --  Output 2200 ml  Net -2200 ml   Filed Weights   11/02/21 0701 11/11/21 1212  Weight: 113.4 kg 112.4 kg     Data Reviewed:   CBC: Recent Labs  Lab 11/18/21 0000 11/20/21 0420 11/21/21 0419 11/24/21 0019  WBC 4.1 3.8* 2.9* 2.9*  NEUTROABS  --  2.5  --   --   HGB 9.3* 9.0* 8.9* 9.3*  HCT 28.2* 27.2* 26.7* 27.9*  MCV 93.7 92.8 91.8 91.8  PLT 286 264 274 865   Basic Metabolic Panel: Recent Labs  Lab 11/18/21 0000 11/20/21 0420 11/21/21 0419 11/24/21 0019  NA 131* 129* 131* 129*  K 4.1 4.0 4.0 3.7  CL 97* 97* 97* 95*  CO2 28 28 28 26   GLUCOSE 119* 114* 123* 126*  BUN 26* 16 17 12   CREATININE 0.97 0.74 0.67 0.69  CALCIUM 9.0 8.6* 8.6* 8.5*  MG 2.1 1.7 1.7 1.7  PHOS  --  3.9  --   --    GFR: Estimated Creatinine Clearance: 111.2 mL/min (by C-G formula based on SCr of 0.69 mg/dL). Liver Function Tests: Recent Labs  Lab 11/20/21 0420  AST 24  ALT 40  ALKPHOS 69  BILITOT 0.5  PROT 7.3  ALBUMIN 2.3*   No results for input(s): LIPASE, AMYLASE in the last 168 hours. No results for input(s): AMMONIA in the last 168 hours. Coagulation Profile: No results for input(s): INR, PROTIME in the last 168 hours. Cardiac Enzymes: No results for input(s): CKTOTAL, CKMB, CKMBINDEX, TROPONINI in the last 168 hours.  BNP (last 3 results) No results for input(s): PROBNP in the last 8760 hours. HbA1C: No results for input(s): HGBA1C in the last 72 hours. CBG: No results for input(s): GLUCAP in the last 168 hours.  Lipid Profile: No results for input(s): CHOL, HDL, LDLCALC, TRIG, CHOLHDL, LDLDIRECT in the last 72 hours. Thyroid Function Tests: No results for input(s): TSH, T4TOTAL, FREET4, T3FREE, THYROIDAB in the last 72 hours. Anemia Panel: No results for input(s):  VITAMINB12, FOLATE, FERRITIN, TIBC, IRON, RETICCTPCT in the last 72 hours. Sepsis Labs: No results for input(s): PROCALCITON, LATICACIDVEN in the last 168 hours.  No results found for this or any previous visit (from the past 240 hour(s)).        Radiology Studies: No results found.      Scheduled Meds:  Chlorhexidine Gluconate Cloth  6 each Topical Daily   docusate sodium  100 mg Oral BID   enoxaparin (LOVENOX) injection  60 mg Subcutaneous Q24H   gabapentin  300 mg Oral TID   mometasone-formoterol  2 puff Inhalation BID   pantoprazole  40 mg Oral Daily   polyethylene glycol  17 g Oral BID   QUEtiapine  25 mg Oral QHS   sodium chloride flush  10-40 mL Intracatheter Q12H   sodium chloride flush  3 mL Intravenous Q12H   Continuous Infusions:  sodium chloride 10 mL/hr at 11/03/21 1032   vancomycin 1,000 mg (11/24/21 0946)     LOS: 22 days   Time spent= 35 mins    Tysha Grismore Chirag  Reesa Chew, MD [facility]  If 7PM-7AM, please contact night-coverage  11/24/2021, 1:24 PM

## 2021-11-24 NOTE — Progress Notes (Signed)
Report called to Sherryll Burger at [facility]. All questions & concerns addressed. Verified that pt is able to transfer with L midline in place.  Will transport via [employer]

## 2021-11-25 NOTE — Progress Notes (Signed)
Pt discharge to [facility]. Midline flushed and locked by IV team, pt transported off unit via stretcher with belongings to the side. Delia Heady RN

## 2021-12-13 ENCOUNTER — Emergency Department (HOSPITAL_COMMUNITY)
Admission: EM | Admit: 2021-12-13 | Discharge: 2021-12-14 | Disposition: A | Discharge status: Skilled Nursing Facility | Payer: Medicare Other | Attending: Emergency Medicine | Admitting: Emergency Medicine

## 2021-12-13 ENCOUNTER — Emergency Department (HOSPITAL_BASED_OUTPATIENT_CLINIC_OR_DEPARTMENT_OTHER): Payer: Medicare Other

## 2021-12-13 ENCOUNTER — Other Ambulatory Visit: Payer: Self-pay

## 2021-12-13 ENCOUNTER — Emergency Department: Payer: Self-pay

## 2021-12-13 DIAGNOSIS — I82612 Acute embolism and thrombosis of superficial veins of left upper extremity: Secondary | ICD-10-CM | POA: Insufficient documentation

## 2021-12-13 DIAGNOSIS — Z792 Long term (current) use of antibiotics: Secondary | ICD-10-CM | POA: Diagnosis not present

## 2021-12-13 DIAGNOSIS — M4620 Osteomyelitis of vertebra, site unspecified: Secondary | ICD-10-CM | POA: Insufficient documentation

## 2021-12-13 DIAGNOSIS — Z452 Encounter for adjustment and management of vascular access device: Secondary | ICD-10-CM | POA: Insufficient documentation

## 2021-12-13 DIAGNOSIS — M79602 Pain in left arm: Secondary | ICD-10-CM | POA: Diagnosis not present

## 2021-12-13 DIAGNOSIS — I808 Phlebitis and thrombophlebitis of other sites: Secondary | ICD-10-CM

## 2021-12-13 DIAGNOSIS — M7989 Other specified soft tissue disorders: Secondary | ICD-10-CM | POA: Diagnosis not present

## 2021-12-13 MED ORDER — SODIUM CHLORIDE 0.9% FLUSH: 10.0000 mL | Freq: Two times a day (BID) | INTRAVENOUS | Status: DC

## 2021-12-13 MED ORDER — OXYCODONE HCL 5 MG PO TABS
10.0000 mg | ORAL_TABLET | Freq: Once | ORAL | Status: AC
  Administered 2021-12-13: 14:00:00 10 mg via ORAL
  Filled 2021-12-13: qty 2

## 2021-12-13 MED ORDER — CHLORHEXIDINE GLUCONATE CLOTH 2 % EX PADS: 6.0000 | MEDICATED_PAD | Freq: Every day | CUTANEOUS | Status: DC

## 2021-12-13 MED ORDER — OXYCODONE HCL 5 MG PO TABS: 15.0000 mg | ORAL_TABLET | Freq: Once | ORAL | Status: DC

## 2021-12-13 MED ORDER — SODIUM CHLORIDE 0.9% FLUSH: 10.0000 mL | INTRAVENOUS | Status: DC | PRN

## 2021-12-13 MED ORDER — OXYCODONE-ACETAMINOPHEN 5-325 MG PO TABS
1.0000 | ORAL_TABLET | Freq: Once | ORAL | Status: AC
  Administered 2021-12-13: 21:00:00 1 via ORAL
  Filled 2021-12-13: qty 1

## 2021-12-13 MED ORDER — HEPARIN SOD (PORK) LOCK FLUSH 100 UNIT/ML IV SOLN
250.0000 [IU] | INTRAVENOUS | Status: DC | PRN
  Filled 2021-12-13: qty 2.5

## 2021-12-13 MED ORDER — GABAPENTIN 300 MG PO CAPS
300.0000 mg | ORAL_CAPSULE | Freq: Once | ORAL | Status: AC
Start: 2021-12-13 — End: 2021-12-13
  Administered 2021-12-13: 14:00:00 300 mg via ORAL
  Filled 2021-12-13: qty 1

## 2021-12-13 NOTE — ED Provider Notes (Signed)
°  Physical Exam  BP 135/80 (BP Location: Right Arm)    Pulse 90    Temp 98.2 F (36.8 C) (Oral)    Resp 16    Ht 6' (1.829 m)    Wt 112 kg    SpO2 99%    BMI 33.49 kg/m   Physical Exam  ED Course/Procedures     Procedures  MDM  Patient is here for PICC line placement.  Patient has a midline on the left side that appears to be clotted.  Signout pending PICC line placement  5:46 PM PICC line is placed on the right side.  Patient is concerned that there may be a DVT on the left side.  Ultrasound did not show DVT but superficial thrombophlebitis.  I recommend warm compresses.  Patient can get IV antibiotics through the PICC line.  Recommend dressing changes every week      Drenda Freeze, MD 12/13/21 1747

## 2021-12-13 NOTE — ED Notes (Addendum)
RN called [facility] after hours number for transport, they are very backed up but he is 14th on the list. RN then called [facility] transport to see if they had anything sooner, they said earliest they can do is 1am. This RN asked them to yes please arrange for 1am transport.  RN put in diet order per Dr Darl Householder and ordered pt a dinner tray.

## 2021-12-13 NOTE — Discharge Instructions (Addendum)
Your PICC line can be used.  The PICC line dressing needs to be changed every week  You have superficial thrombophlebitis.  Please use warm compresses 3 times daily to help with the swelling  See your doctor for follow-up  Return to ER if you have clot in the PICC line, chest pain, shortness of breath, fever

## 2021-12-13 NOTE — ED Provider Notes (Signed)
[facility] Provider Note   CSN: [id number] Arrival date & time:        History Chief Complaint  Patient presents with   Vascular Access Problem    [facility]. is a 70 y.o. male.  Pt presents to the ED today with pain in his left arm where his midline IV is located.  Pt was in the hospital from 11/13 to 12/5 for cervical epidural abscess with osteomyelitis/discitis.  He has been on IV abx for his MRSA infection via a midline IV.  Pt said they have been giving it to him too fast and he thinks it is blown.  He said it was very painful last night when they tried to give him his abx.  He has not had his morning dose.        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    GERD (gastroesophageal reflux disease)    Hyperlipidemia    Hypertension    Pneumonia due to COVID-19 virus    Pneumonia, bacterial    Prostate cancer [facility])     Patient Active Problem List   Diagnosis Date Noted   Back pain 11/19/2021   Abdominal pain 09/38/1829   Acute metabolic encephalopathy 93/71/6967   Obesity 11/19/2021   Cervical discitis    Epidural abscess    Discitis of lumbosacral region    Right lower quadrant abdominal pain    Vertebral osteomyelitis ([place])    MRSA bacteremia    Retropharyngeal abscess    Sepsis ([place]) 11/02/2021   Asthma 10/25/2021   Acute respiratory failure with hypoxia ([place]) 10/25/2021   Influenza A 10/24/2021   Hypertension    Hyperlipidemia    Hypokalemia    Anemia    Hyponatremia     No past surgical history on file.     Family History  Problem Relation Age of Onset   Hypertension Mother    Skin cancer Mother    Osteoporosis Mother    Hypertension Father     Social History   Tobacco Use   Smoking status: Every Day    Types: Cigarettes   Smokeless tobacco: Never  Vaping Use   Vaping Use: Never used  Substance Use Topics   Alcohol use: Never    Drug use: Never    Home Medications Prior to Admission medications   Medication Sig Start Date End Date Taking? Authorizing Provider  acetaminophen (TYLENOL) 650 MG CR tablet Take 650 mg by mouth every 8 (eight) hours as needed for pain.    [provider]  albuterol (PROVENTIL) (2.5 MG/3ML) 0.083% nebulizer solution Take 3 mLs by nebulization 3 (three) times daily. 10/28/21   Lavina Hamman, MD  amLODipine (NORVASC) 5 MG tablet Take 5 mg by mouth daily.    [provider]  aspirin 81 MG chewable tablet Chew by mouth daily.    [provider]  benzonatate (TESSALON) 100 MG capsule Take 1 capsule (100 mg total) by mouth 3 (three) times daily. 10/28/21   Lavina Hamman, MD  chlorpheniramine-HYDROcodone (TUSSIONEX) 10-8 MG/5ML SUER Take 5 mLs by mouth every 12 (twelve) hours as needed for cough. 10/28/21   Lavina Hamman, MD  Cholecalciferol (VITAMIN D3) 25 MCG (1000 UT) CAPS Take 1,000 Units by mouth daily.    [provider]  Cyanocobalamin (B-12 PO) Take 1 tablet by mouth daily.    [provider]  dextromethorphan (DELSYM) 30 MG/5ML liquid Take 5 mLs (30 mg total) by mouth 2 (two) times daily as needed for cough. 10/28/21   Lavina Hamman, MD  dextromethorphan-guaiFENesin [facility] DM) 30-600 MG 12hr tablet Take 1 tablet by mouth 2 (two) times daily. 10/28/21   Lavina Hamman, MD  docusate sodium (COLACE) 100 MG capsule Take 1 capsule (100 mg total) by mouth 2 (two) times daily. 10/28/21 10/28/22  Lavina Hamman, MD  gabapentin (NEURONTIN) 300 MG capsule Take 1 tablet (300 mg) in the morning and 2 tablets (600 mg) before bed. Patient taking differently: Take 300-600 mg by mouth See admin instructions. Take 300 mg in the morning and afternoon and 600 mg before bed. 08/01/21   Raspet, Erin K, PA-C  hydrochlorothiazide (HYDRODIURIL) 12.5 MG tablet Take 12.5 mg by mouth daily.    [provider]  ketotifen (ZADITOR) 0.025 % ophthalmic solution Place  1 drop into both eyes 2 (two) times daily as needed (allergies).    [provider]  losartan (COZAAR) 50 MG tablet Take 50 mg by mouth daily.    [provider]  omeprazole (PRILOSEC) 20 MG capsule Take 20 mg by mouth 2 (two) times daily before a meal.    [provider]  oxyCODONE (ROXICODONE) 15 MG immediate release tablet Take 1 tablet (15 mg total) by mouth every 4 (four) hours as needed for moderate pain or severe pain. 11/24/21   Amin, Jeanella Flattery, MD  polyethylene glycol (MIRALAX / GLYCOLAX) 17 g packet Take 17 g by mouth daily. 10/29/21   Lavina Hamman, MD  Pyridoxine HCl (B-6 PO) Take 1 tablet by mouth daily.    [provider]  QUEtiapine (SEROQUEL) 25 MG tablet Take 1 tablet (25 mg total) by mouth at bedtime. 11/24/21   Amin, Jeanella Flattery, MD  simvastatin (ZOCOR) 40 MG tablet Take 40 mg by mouth daily.    [provider]  tizanidine (ZANAFLEX) 2 MG capsule Take 1 capsule (2 mg total) by mouth at bedtime as needed for muscle spasms. Patient not taking: No sig reported 08/01/21   Raspet, Erin K, PA-C  vancomycin IVPB Inject 1,000 mg into the vein every 12 (twelve) hours. Indication:  MRSA bacteremia/epidural abscess/cervical-lumbar discitis  First Dose: Yes Last Day of Therapy:  01/03/22 Labs - Sunday/Monday:  CBC/D, BMP, and vancomycin trough. Labs - Thursday:  BMP and vancomycin trough Labs - Every other week:  ESR and CRP Method of administration:Elastomeric Method of administration may be changed at the discretion of the patient and/or caregiver's ability to self-administer the medication ordered. 11/24/21 01/03/22  Amin, Ankit Chirag, MD  WIXELA INHUB 250-50 MCG/ACT AEPB Inhale 1 puff into the lungs 2 (two) times daily. 10/09/21   [provider]    Allergies    Elavil [amitriptyline] and Tetracyclines & related  Review of Systems   Review of Systems  Musculoskeletal:        Left arm pain  All other systems reviewed and are  negative.  Physical Exam Updated Vital Signs BP 140/74 (BP Location: Right Arm)    Pulse 92    Temp 98.2 F (36.8 C) (Oral)    Resp 14    Ht 6' (1.829 m)    Wt 112 kg    SpO2 99%    BMI 33.49 kg/m   Physical Exam Vitals and nursing note reviewed.  Constitutional:      Appearance: Normal appearance.  HENT:     Head: Normocephalic and atraumatic.  Right Ear: External ear normal.     Left Ear: External ear normal.     Nose: Nose normal.     Mouth/Throat:     Mouth: Mucous membranes are moist.     Pharynx: Oropharynx is clear.  Eyes:     Extraocular Movements: Extraocular movements intact.     Conjunctiva/sclera: Conjunctivae normal.     Pupils: Pupils are equal, round, and reactive to light.  Cardiovascular:     Rate and Rhythm: Normal rate and regular rhythm.     Pulses: Normal pulses.     Heart sounds: Normal heart sounds.  Pulmonary:     Effort: Pulmonary effort is normal.     Breath sounds: Normal breath sounds.  Abdominal:     General: Abdomen is flat. Bowel sounds are normal.     Palpations: Abdomen is soft.  Musculoskeletal:     Cervical back: Normal range of motion and neck supple.     Comments: Left upper arm midline IV noted  Skin:    General: Skin is warm.     Capillary Refill: Capillary refill takes less than 2 seconds.  Neurological:     General: No focal deficit present.     Mental Status: He is alert and oriented to person, place, and time.  Psychiatric:        Mood and Affect: Mood normal.        Behavior: Behavior normal.    ED Results / Procedures / Treatments   Labs (all labs ordered are listed, but only abnormal results are displayed) Labs Reviewed - No data to display  EKG None  Radiology [place] EKG SITE RITE  Result Date: 12/13/2021 If Site Rite image not attached, placement could not be confirmed due to current cardiac rhythm.   Procedures Procedures   Medications Ordered in ED Medications  gabapentin (NEURONTIN) capsule 300 mg  (300 mg Oral Given 12/13/21 1404)  oxyCODONE (Oxy IR/ROXICODONE) immediate release tablet 10 mg (10 mg Oral Given 12/13/21 1404)    ED Course  I have reviewed the triage vital signs and the nursing notes.  Pertinent labs & imaging results that were available during my care of the patient were reviewed by me and considered in my medical decision making (see chart for details).    MDM Rules/Calculators/A&P    The PICC line team has been consulted and they will come to place a PICC line and remove his midline.  However, they will not be here before shift change.  Pt signed out to Dr. Darl Householder.  Pt given his home pain meds for comfort.      Final Clinical Impression(s) / ED Diagnoses Final diagnoses:  Needs peripherally inserted central catheter (PICC)    Rx / DC Orders ED Discharge Orders     None        Isla Pence, MD 12/13/21 [phone]

## 2021-12-13 NOTE — ED Notes (Signed)
Pt was concerned re: blood clot in old midline site. Dr Darl Householder orderd DVT study of L upper arm to rule out. Pt to ultrasound at this time.

## 2021-12-13 NOTE — ED Triage Notes (Signed)
Pt from [facility] with midline to LUE d/t long term antibiotics, reports pain and bleeding recently from site and sent here for eval

## 2021-12-13 NOTE — ED Notes (Signed)
Successful RUE PICC insertion. Pt back to hallway bed.

## 2021-12-13 NOTE — Progress Notes (Signed)
VASCULAR LAB    Left lower extremity venous duplex has been performed.  See CV proc for preliminary results.  Messaged results to Dr. Darl Householder via secure chat.  Marcella Charlson, RVT 12/13/2021, 5:31 PM

## 2021-12-13 NOTE — ED Notes (Addendum)
Pt requesting oxy and gabapentin for back pain. RN clarified dose with med list from facility and confirmed with Dr Gilford Raid that it was okay to administer.

## 2021-12-13 NOTE — ED Notes (Signed)
Spoke to [employer] again, they have him scheduled for a 12am pickup

## 2021-12-13 NOTE — Progress Notes (Signed)
Peripherally Inserted Central Catheter Placement  The IV Nurse has discussed with the patient and/or persons authorized to consent for the patient, the purpose of this procedure and the potential benefits and risks involved with this procedure.  The benefits include less needle sticks, lab draws from the catheter, and the patient may be discharged home with the catheter. Risks include, but not limited to, infection, bleeding, blood clot (thrombus formation), and puncture of an artery; nerve damage and irregular heartbeat and possibility to perform a PICC exchange if needed/ordered by physician.  Alternatives to this procedure were also discussed.  Bard Power PICC patient education guide, fact sheet on infection prevention and patient information card has been provided to patient /or left at bedside.    PICC Placement Documentation  PICC Single Lumen 12/13/21 Right Brachial 38 cm 0 cm (Active)  Indication for Insertion or Continuance of Line Home intravenous therapies (PICC only) 12/13/21 1624  Exposed Catheter (cm) 0 cm 12/13/21 1624  Site Assessment Clean;Dry;Intact 12/13/21 1624  Line Status Flushed;Saline locked;Blood return noted 12/13/21 1624  Dressing Type Transparent 12/13/21 1624  Dressing Status Clean;Dry;Intact 12/13/21 1624  Antimicrobial disc in place? Yes 12/13/21 1624  Safety Lock Not Applicable 62/44/69 5072  Line Care Connections checked and tightened 12/13/21 1624  Line Adjustment (NICU/IV Team Only) No 12/13/21 1624  Dressing Intervention New dressing 12/13/21 1624  Dressing Change Due 12/20/21 12/13/21 1624       Rolena Infante 12/13/2021, 4:25 PM

## 2022-01-29 ENCOUNTER — Other Ambulatory Visit: Payer: Self-pay

## 2022-01-29 ENCOUNTER — Ambulatory Visit (INDEPENDENT_AMBULATORY_CARE_PROVIDER_SITE_OTHER): Payer: Medicare Other | Admitting: Infectious Diseases

## 2022-01-29 ENCOUNTER — Encounter: Payer: Self-pay | Admitting: Infectious Diseases

## 2022-01-29 DIAGNOSIS — B9562 Methicillin resistant Staphylococcus aureus infection as the cause of diseases classified elsewhere: Secondary | ICD-10-CM | POA: Diagnosis not present

## 2022-01-29 DIAGNOSIS — R7881 Bacteremia: Secondary | ICD-10-CM | POA: Diagnosis not present

## 2022-01-29 DIAGNOSIS — M4620 Osteomyelitis of vertebra, site unspecified: Secondary | ICD-10-CM

## 2022-01-29 NOTE — Progress Notes (Signed)
° °  Subjective:    Patient ID: Charles Hu., male    DOB: 1951-07-21, 71 y.o.   MRN: [id number]  HPI 71 yo M adm from ED--> Wl (11-02-21) with MRSA bacteremia,  Discitis/Osteo L4-5, L5-S1 C1-2 epidural abscess, osteomyelitis He was felt to be too high risk for TEE. He had repeat BCx (-).  He was given 8 weeks of anbx (2 weeks total dapto/ceftaroline then dapto alone to complete 8 weeks).    He has been to ED for [facility] issues, was found to have superficial thrombophlebitis.   He completed his anbx on Jan 14.  He still has some pain, taking oxycodone prn. Pain is in his L hip, legs, joints, feet. Burning pain which triggers his neuropathy and restless leg/feet.    Review of Systems  Constitutional:  Negative for appetite change, chills, fever and unexpected weight change.  Cardiovascular:  Positive for leg swelling.  Gastrointestinal:  Negative for constipation and diarrhea.  Genitourinary:  Negative for difficulty urinating, dysuria and frequency.  Musculoskeletal:  Positive for arthralgias.      Objective:   Physical Exam Vitals reviewed.  Constitutional:      Appearance: Normal appearance. He is obese.  HENT:     Mouth/Throat:     Mouth: Mucous membranes are moist.     Pharynx: No oropharyngeal exudate.  Eyes:     Extraocular Movements: Extraocular movements intact.     Pupils: Pupils are equal, round, and reactive to light.  Cardiovascular:     Rate and Rhythm: Normal rate and regular rhythm.     Heart sounds: No murmur heard. Pulmonary:     Effort: Pulmonary effort is normal.     Breath sounds: Normal breath sounds.  Abdominal:     General: Bowel sounds are normal. There is no distension.     Palpations: Abdomen is soft.     Tenderness: There is no abdominal tenderness.  Musculoskeletal:        General: Swelling present.     Cervical back: Normal range of motion and neck supple.     Right lower leg: Edema present.     Left lower leg: Edema present.   Neurological:     Mental Status: He is alert.          Assessment & Plan:

## 2022-01-29 NOTE — Assessment & Plan Note (Signed)
Will repeat his BCx Defer his TEE

## 2022-01-29 NOTE — Assessment & Plan Note (Addendum)
His pain is better with standing, exercise does not bother him He still has fatigue.  He asks for oxycodone refill.  Will repeat his BCx Defer his TEE He does not want to do MRI due history of claustrophobia. To prove how good his back is, he touches his toes. Does knee lifts as well. Maintains his ADLs.  Will check his ESR and CRP.  rtc prn, will call if abnormalities of lab.

## 2022-02-03 LAB — CULTURE, BLOOD (SINGLE)
MICRO NUMBER:: 12987765
MICRO NUMBER:: 12987766
Result:: NO GROWTH
Result:: NO GROWTH
SPECIMEN QUALITY:: ADEQUATE
SPECIMEN QUALITY:: ADEQUATE

## 2022-02-03 LAB — C-REACTIVE PROTEIN: CRP: 1.7 mg/L (ref <8.0)

## 2022-02-03 LAB — SEDIMENTATION RATE: Sed Rate: 43 mm/h — ABNORMAL HIGH (ref 0–20)

## 2022-02-11 ENCOUNTER — Telehealth: Payer: Self-pay | Admitting: Infectious Diseases

## 2022-02-11 NOTE — Telephone Encounter (Signed)
Called pt with results of his previous visit. Did not answer phone, no VM.  His CRP has improved, his ESR has improved as well. His BCx is negative.  Will presume his infection is resolved, he did not want MRI.  He can f/u with his PCP.

## 2022-06-10 ENCOUNTER — Encounter: Payer: Self-pay | Admitting: Infectious Diseases

## 2023-03-09 NOTE — Progress Notes (Addendum)
Anesthesia Review:  [id number] Charles Weiss ?  Cardiologist : none  Chest x-ray : EKG :   Echo : 11/03/21  Stress test: Cardiac Cath :  Activity level: cannot do a flight of stairs withoiut difficutly  Sleep Study/ CPAP : none  Fasting Blood Sugar :      / Checks Blood Sugar -- times a day:   Blood Thinner/ Instructions /Last Dose: ASA / Instructions/ Last Dose :    Prediabetes - checks glucose on occasion at home   Hgba1c- 6.3 on 03/12/23   PT lives in group home due to hx of substance abuse with 9 other men.     Pt reports at time of preop appt- Sore Throat and runny nose started on 03/09/2023 .  PT saw Dr Charles Weiss and was negative for Covid and strep per pt.  Only symptoims at [facility] of preop on 03/14/23 are runny nose.  PT states Feels Fine.  Never had fever.  [name] aware.     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     Hgba1c still in process on 03/15/23 after being collected on 03/12/23.  Called lab.  Lab called [name] in process on 03/15/23.    PT called on 03/15/23 to ask some questions in regards to surgery.  PT wanted to know if he could drink a [place] day of surgery. He was instructed that he could not.  PT also asked about Rehab facitliy since he lives in a [name].  Informed pt that caseworkers would be working with [name] after surgery.  PT states at time of phone call that [name] is aware of this and that he wears an Ankle Bracelet and that he has a note for Ankle Bracelet to be off day of surgery.  PT staes he has Research officer, trade union..  PT states that due to swelling will not ankle bracelet need to be off for more than dos.  Informed pt that he would need to contact [name] in regards to a letter or note for this.  PT voiced understanding.    Called [name] at [name] office on 03/15/23 and made her aware of above.  [name] stated she would make sure [name] is aware of this [name] stated she was unaware.   Informed [name] that pt states his parole office is aware he is having surgery .  Informed [name] that pt would not tell me the reason ankle bracelet is on.  I am assuming that parole officer will handle the removeal of ankle bracelet on DOS.  Janett Billow [name] aware and stated she would follow up on it.    [name] from [name] office called back on 03/15/23 and stated she had spoken with [name], [place] at [name] office and that at time of H and P at Ortho office with pt that note had been given to pt by [place], PA stating that ankle bracelet could not be on operative leg day of surgery but could be on opposite leg.  .  This note was to be given to parole officer by pt.

## 2023-03-10 NOTE — Patient Instructions (Addendum)
SURGICAL WAITING ROOM VISITATION  Patients having surgery or a procedure may have no more than 2 support people in the waiting area - these visitors may rotate.    Children under the age of 68 must have an adult with them who is not the patient.  Due to an increase in RSV and influenza rates and associated hospitalizations, children ages 2 and under may not visit patients in [place].  If the patient needs to stay at the hospital during part of their recovery, the visitor guidelines for inpatient rooms apply. Pre-op nurse will coordinate an appropriate time for 1 support person to accompany patient in pre-op.  This support person may not rotate.    Please refer to the [facility] website for the visitor guidelines for Inpatients (after your surgery is over and you are in a regular room).       Your procedure is scheduled on:  03/23/23    Report to [facility] Main Entrance    Report to admitting at  1030 AM   Call this number if you have problems the morning of surgery [phone]   Do not eat food :After Midnight.   After Midnight you may have the following liquids until _  0945_____ AM DAY OF SURGERY  Water Non-Citrus Juices (without pulp, NO RED-Apple, White grape, White cranberry) Black Coffee (NO MILK/CREAM OR CREAMERS, sugar ok)  Clear Tea (NO MILK/CREAM OR CREAMERS, sugar ok) regular and decaf                             Plain Jell-O (NO RED)                                           Fruit ices (not with fruit pulp, NO RED)                                     Popsicles (NO RED)                                                               Sports drinks like Gatorade (NO RED)                     The day of surgery:  Drink ONE (1) Pre-Surgery Clear Ensure or G2 at  0945AM  ( have completed by ) the morning of surgery. Drink in one sitting. Do not sip.  This drink was given to you during your hospital  pre-op appointment visit. Nothing else to  drink after completing the  Pre-Surgery Clear Ensure or G2.          If you have questions, please contact your surgeon's office.       Oral Hygiene is also important to reduce your risk of infection.                                    Remember - BRUSH YOUR TEETH THE MORNING OF SURGERY  WITH YOUR REGULAR TOOTHPASTE  DENTURES WILL BE REMOVED PRIOR TO SURGERY PLEASE DO NOT APPLY "Poly grip" OR ADHESIVES!!!   Do NOT smoke after Midnight   Take these medicines the morning of surgery with A SIP OF WATER: nebulizer, amlodipine, gabapentin, omeprazole   DO NOT TAKE ANY ORAL DIABETIC MEDICATIONS DAY OF YOUR SURGERY  Bring CPAP mask and tubing day of surgery.                              You may not have any metal on your body including hair pins, jewelry, and body piercing             Do not wear make-up, lotions, powders, perfumes/cologne, or deodorant  Do not wear nail polish including gel and S&S, artificial/acrylic nails, or any other type of covering on natural nails including finger and toenails. If you have artificial nails, gel coating, etc. that needs to be removed by a nail salon please have this removed prior to surgery or surgery may need to be canceled/ delayed if the surgeon/ anesthesia feels like they are unable to be safely monitored.   Do not shave  48 hours prior to surgery.               Men may shave face and neck.   Do not bring valuables to the hospital. [place].   Contacts, glasses, dentures or bridgework may not be worn into surgery.   Bring small overnight bag day of surgery.   DO NOT [place]. PHARMACY WILL DISPENSE MEDICATIONS LISTED ON YOUR MEDICATION LIST TO YOU DURING YOUR ADMISSION [place]!    Patients discharged on the day of surgery will not be allowed to drive home.  Someone NEEDS to stay with you for the first 24 hours after anesthesia.   Special  Instructions: Bring a copy of your healthcare power of attorney and living will documents the day of surgery if you haven't scanned them before.              Please read over the following fact sheets you were given: IF [place] [phone]   If you received a COVID test during your pre-op visit  it is requested that you wear a mask when out in public, stay away from anyone that may not be feeling well and notify your surgeon if you develop symptoms. If you test positive for Covid or have been in contact with anyone that has tested positive in the last 10 days please notify you surgeon.    [place] - Preparing for Surgery Before surgery, you can play an important role.  Because skin is not sterile, your skin needs to be as free of germs as possible.  You can reduce the number of germs on your skin by washing with CHG (chlorahexidine gluconate) soap before surgery.  CHG is an antiseptic cleaner which kills germs and bonds with the skin to continue killing germs even after washing. Please DO NOT use if you have an allergy to CHG or antibacterial soaps.  If your skin becomes reddened/irritated stop using the CHG and inform your nurse when you arrive at Short Stay. Do not shave (including legs and underarms) for at least 48 hours prior to the  first CHG shower.  You may shave your face/neck. Please follow these instructions carefully:  1.  Shower with CHG Soap the night before surgery and the  morning of Surgery.  2.  If you choose to wash your hair, wash your hair first as usual with your  normal  shampoo.  3.  After you shampoo, rinse your hair and body thoroughly to remove the  shampoo.                           4.  Use CHG as you would any other liquid soap.  You can apply chg directly  to the skin and wash                       Gently with a scrungie or clean washcloth.  5.  Apply the CHG Soap to your body ONLY FROM THE NECK DOWN.   Do not use  on face/ open                           Wound or open sores. Avoid contact with eyes, ears mouth and genitals (private parts).                       Wash face,  Genitals (private parts) with your normal soap.             6.  Wash thoroughly, paying special attention to the area where your surgery  will be performed.  7.  Thoroughly rinse your body with warm water from the neck down.  8.  DO NOT shower/wash with your normal soap after using and rinsing off  the CHG Soap.                9.  Pat yourself dry with a clean towel.            10.  Wear clean pajamas.            11.  Place clean sheets on your bed the night of your first shower and do not  sleep with pets. Day of Surgery : Do not apply any lotions/deodorants the morning of surgery.  Please wear clean clothes to the hospital/surgery center.  FAILURE TO FOLLOW THESE INSTRUCTIONS MAY RESULT IN THE CANCELLATION OF YOUR SURGERY PATIENT SIGNATURE_________________________________  NURSE SIGNATURE__________________________________  ________________________________________________________________________

## 2023-03-12 ENCOUNTER — Other Ambulatory Visit: Payer: Self-pay

## 2023-03-12 ENCOUNTER — Encounter (HOSPITAL_COMMUNITY)
Admission: RE | Admit: 2023-03-12 | Discharge: 2023-03-12 | Disposition: A | Discharge status: Home / Self Care | Payer: 59 | Source: Ambulatory Visit | Attending: Orthopedic Surgery | Admitting: Orthopedic Surgery

## 2023-03-12 ENCOUNTER — Encounter (HOSPITAL_COMMUNITY): Payer: Self-pay

## 2023-03-12 VITALS — BP 155/89 | HR 79 | Temp 98.4°F | Resp 16 | Ht 69.0 in | Wt 241.0 lb

## 2023-03-12 DIAGNOSIS — J449 Chronic obstructive pulmonary disease, unspecified: Secondary | ICD-10-CM | POA: Diagnosis not present

## 2023-03-12 DIAGNOSIS — M1711 Unilateral primary osteoarthritis, right knee: Secondary | ICD-10-CM | POA: Insufficient documentation

## 2023-03-12 DIAGNOSIS — Z01818 Encounter for other preprocedural examination: Secondary | ICD-10-CM | POA: Diagnosis present

## 2023-03-12 DIAGNOSIS — I10 Essential (primary) hypertension: Secondary | ICD-10-CM | POA: Insufficient documentation

## 2023-03-12 DIAGNOSIS — Z8616 Personal history of COVID-19: Secondary | ICD-10-CM | POA: Insufficient documentation

## 2023-03-12 DIAGNOSIS — E118 Type 2 diabetes mellitus with unspecified complications: Secondary | ICD-10-CM | POA: Insufficient documentation

## 2023-03-12 DIAGNOSIS — Z87891 Personal history of nicotine dependence: Secondary | ICD-10-CM | POA: Diagnosis not present

## 2023-03-12 HISTORY — DX: Unspecified osteoarthritis, unspecified site: M19.90

## 2023-03-12 HISTORY — DX: Chronic obstructive pulmonary disease, unspecified: J44.9

## 2023-03-12 HISTORY — DX: Post-traumatic stress disorder, unspecified: F43.10

## 2023-03-12 HISTORY — DX: Nausea with vomiting, unspecified: Z98.890

## 2023-03-12 HISTORY — DX: Polyneuropathy, unspecified: G62.9

## 2023-03-12 HISTORY — DX: Prediabetes: R73.03

## 2023-03-12 HISTORY — DX: Other specified postprocedural states: R11.2

## 2023-03-12 LAB — BASIC METABOLIC PANEL
Anion gap: 8 (ref 5–15)
BUN: 13 mg/dL (ref 8–23)
CO2: 26 mmol/L (ref 22–32)
Calcium: 9 mg/dL (ref 8.9–10.3)
Chloride: 102 mmol/L (ref 98–111)
Creatinine, Ser: 0.86 mg/dL (ref 0.61–1.24)
GFR, Estimated: >60 mL/min (ref >60)
Glucose, Bld: 122 mg/dL — ABNORMAL HIGH (ref 70–99)
Potassium: 4.2 mmol/L (ref 3.5–5.1)
Sodium: 136 mmol/L (ref 135–145)

## 2023-03-12 LAB — CBC
HCT: 37.7 % — ABNORMAL LOW (ref 39.0–52.0)
Hemoglobin: 12.3 g/dL — ABNORMAL LOW (ref 13.0–17.0)
MCH: 29.9 pg (ref 26.0–34.0)
MCHC: 32.6 g/dL (ref 30.0–36.0)
MCV: 91.7 fL (ref 80.0–100.0)
Platelets: 231 10*3/uL (ref 150–400)
RBC: 4.11 MIL/uL — ABNORMAL LOW (ref 4.22–5.81)
RDW: 14.8 % (ref 11.5–15.5)
WBC: 4.4 10*3/uL (ref 4.0–10.5)
nRBC: 0 % (ref 0.0–0.2)

## 2023-03-12 LAB — SURGICAL PCR SCREEN
MRSA, PCR: POSITIVE — AB
Staphylococcus aureus: POSITIVE — AB

## 2023-03-12 LAB — GLUCOSE, CAPILLARY: Glucose-Capillary: 180 mg/dL — ABNORMAL HIGH (ref 70–99)

## 2023-03-15 LAB — HEMOGLOBIN A1C
Hgb A1c MFr Bld: 6.3 % — ABNORMAL HIGH (ref 4.8–5.6)
Mean Plasma Glucose: 134 mg/dL

## 2023-03-16 NOTE — Anesthesia Preprocedure Evaluation (Addendum)
Anesthesia Evaluation  Patient identified by MRN, date of birth, ID band Patient awake    Reviewed: Allergy & Precautions, NPO status , Patient's Chart, lab work & pertinent test results  History of Anesthesia Complications (+) PONV and history of anesthetic complications  Airway Mallampati: III  TM Distance: >3 FB Neck ROM: Full    Dental  (+) Dental Advisory Given, Edentulous Upper 7 broken teeth on the bottom:   Pulmonary neg shortness of breath, asthma , neg sleep apnea, COPD, Recent URI , Resolved, Patient abstained from smoking., former smoker   Pulmonary exam normal breath sounds clear to auscultation       Cardiovascular hypertension (amlodipine, HCTZ, losartan), Pt. 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 Left ventricular ejection fraction, by estimation, is 55 to 60%. The  left ventricle has normal function. The left ventricle has no regional  wall motion abnormalities. There is mild concentric left ventricular  hypertrophy. Left ventricular diastolic  parameters are consistent with Grade I diastolic dysfunction (impaired  relaxation).   2. Right ventricular systolic function is normal. The right ventricular  size is normal.   3. The mitral valve is normal in structure. No evidence of mitral valve  regurgitation. No evidence of mitral stenosis.   4. The aortic valve is normal in structure. Aortic valve regurgitation is  not visualized. No aortic stenosis is present.   5. There is borderline dilatation of the ascending aorta, measuring 36  mm.   6. The inferior vena cava is normal in size with greater than 50%  respiratory variability, suggesting right atrial pressure of 3 mmHg.     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 Per ID notes 02/11/2022 infection resolved.  Neuromuscular disease (neuropathy)    GI/Hepatic Neg liver ROS,GERD  Medicated,,  Endo/Other  Pre-diabetes  Renal/GU negative Renal ROS     Musculoskeletal  (+) Arthritis ,    Abdominal  (+) + obese  Peds  Hematology  (+) Blood dyscrasia, anemia   Anesthesia Other Findings H/o prostate cancer  Patient had nausea/vomiting Friday night (3/29). He had a fever on Sunday but has been afebrile >24 hours. COVID and flu tests were negative x2 per patient. He reports that he is feeling well today.  Reproductive/Obstetrics                              Anesthesia Physical Anesthesia Plan  ASA: 3  Anesthesia Plan: General   Post-op Pain Management: Tylenol PO (pre-op)*   Induction: Intravenous  PONV Risk Score and Plan: 3 and Ondansetron, Dexamethasone and Treatment may vary due to age or medical condition  Airway Management Planned: Oral ETT  Additional Equipment:   Intra-op Plan:   Post-operative Plan: Extubation in OR  Informed Consent: I have reviewed the patients History and Physical, chart, labs and discussed the procedure including the risks, benefits and alternatives for the proposed anesthesia with the patient or authorized representative who has indicated his/her understanding and acceptance.     Dental advisory given  Plan Discussed with: CRNA and Anesthesiologist  Anesthesia Plan Comments: (See PAT note 03/12/23  Patient with h/o epidural abscess due to MRSA. Patient's nasal swab positive for MRSA 03/12/2023. Discussed with patient  spinal versus GA and possible risks of infection. Given patient's h/o epidural abscess, will do GA.   Risks of general anesthesia discussed including, but not limited to, sore throat, hoarse voice, chipped/damaged teeth, injury to vocal cords, nausea and vomiting, allergic reactions, lung infection, heart attack,  stroke, and death. All questions answered. )        Anesthesia Quick Evaluation

## 2023-03-16 NOTE — Progress Notes (Signed)
Anesthesia Chart Review   Case: [id number] Date/Time: 03/23/23 1240   Procedure: TOTAL KNEE ARTHROPLASTY (Right: Knee)   Anesthesia type: Spinal   Pre-op diagnosis: Right knee osteoarthritis   Location: [facility] [place] [facility] / [facility] ORS   Surgeons: Paralee Cancel, MD       DISCUSSION:72 y.o. former smoker with h/o PONV, HTN, COPD (asymptomatic at PAT visit), prostate cancer, right knee OA scheduled for above procedure 03/23/23 with Dr. Paralee Cancel.   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 Per ID notes 02/11/2022 infection resolved.   Pt with ankle monitor in place. Ortho office gave a note to pt to give to parole officer to have the ankle monitor moved to the non-operative leg. Pt reports he has informed Research officer, trade union.  Informed Jasmine Pang, RN. If not removed for case padding should be place between monitor and patient's skin.  VS: BP (!) 155/89   Pulse 79   Temp 36.9 C (Oral)   Resp 16   Ht 5\' 9"  (1.753 m)   Wt 109.3 kg   SpO2 96%   BMI 35.59 kg/m   PROVIDERS: System, Provider Not In   LABS: Labs reviewed: Acceptable for surgery. (all labs ordered are listed, but only abnormal results are displayed)  Labs Reviewed  SURGICAL PCR SCREEN - Abnormal; Notable for the following components:      Result Value   MRSA, PCR POSITIVE (*)    Staphylococcus aureus POSITIVE (*)    All other components within normal limits  BASIC METABOLIC PANEL - Abnormal; Notable for the following components:   Glucose, Bld 122 (*)    All other components within normal limits  CBC - Abnormal; Notable for the following components:   RBC 4.11 (*)    Hemoglobin 12.3 (*)    HCT 37.7 (*)    All other components within normal limits  GLUCOSE, CAPILLARY - Abnormal; Notable for the following components:   Glucose-Capillary 180 (*)    All other components within normal limits  HEMOGLOBIN A1C - Abnormal; Notable for the following components:   Hgb A1c MFr Bld 6.3 (*)    All other components within  normal limits     IMAGES:   EKG:   CV: Echo 11/03/2021  1. Left ventricular ejection fraction, by estimation, is 55 to 60%. The  left ventricle has normal function. The left ventricle has no regional  wall motion abnormalities. There is mild concentric left ventricular  hypertrophy. Left ventricular diastolic  parameters are consistent with Grade I diastolic dysfunction (impaired  relaxation).   2. Right ventricular systolic function is normal. The right ventricular  size is normal.   3. The mitral valve is normal in structure. No evidence of mitral valve  regurgitation. No evidence of mitral stenosis.   4. The aortic valve is normal in structure. Aortic valve regurgitation is  not visualized. No aortic stenosis is present.   5. There is borderline dilatation of the ascending aorta, measuring 36  mm.   6. The inferior vena cava is normal in size with greater than 50%  respiratory variability, suggesting right atrial pressure of 3 mmHg.  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    COPD (chronic obstructive pulmonary disease) (HCC)    GERD (gastroesophageal reflux disease)    Hyperlipidemia    Hypertension    Neuropathy    Pneumonia due to COVID-19 virus    Pneumonia,  bacterial    PONV (postoperative nausea and vomiting)    Pre-diabetes    Prostate cancer (HCC)    PTSD (post-traumatic stress disorder)     Past Surgical History:  Procedure Laterality Date   bilateral shoulder surgery      right knee scope      right wrist surgery      sebacewous cyst surgery       MEDICATIONS:  acetaminophen (TYLENOL) 650 MG CR tablet   albuterol (PROVENTIL) (2.5 MG/3ML) 0.083% nebulizer solution   albuterol (VENTOLIN HFA) 108 (90 Base) MCG/ACT inhaler   amLODipine (NORVASC) 5 MG tablet   aspirin EC 81 MG tablet   benzonatate (TESSALON) 100 MG capsule   busPIRone (BUSPAR) 10 MG tablet    chlorpheniramine-HYDROcodone (TUSSIONEX) 10-8 MG/5ML SUER   Cholecalciferol (VITAMIN D3) 25 MCG (1000 UT) CAPS   ciclesonide (ALVESCO) 80 MCG/ACT inhaler   dextromethorphan (DELSYM) 30 MG/5ML liquid   dextromethorphan-guaiFENesin (MUCINEX DM) 30-600 MG 12hr tablet   docusate sodium (COLACE) 100 MG capsule   gabapentin (NEURONTIN) 300 MG capsule   gabapentin (NEURONTIN) 800 MG tablet   hydrochlorothiazide (HYDRODIURIL) 12.5 MG tablet   hydroxypropyl methylcellulose / hypromellose (ISOPTO TEARS / GONIOVISC) 2.5 % ophthalmic solution   ketotifen (ZADITOR) 0.025 % ophthalmic solution   losartan (COZAAR) 50 MG tablet   methocarbamol (ROBAXIN) 750 MG tablet   mometasone (ASMANEX) 220 MCG/ACT inhaler   montelukast (SINGULAIR) 10 MG tablet   omeprazole (PRILOSEC) 20 MG capsule   oxycodone (OXY-IR) 5 MG capsule   oxyCODONE (ROXICODONE) 15 MG immediate release tablet   pantoprazole (PROTONIX) 40 MG tablet   polyethylene glycol (MIRALAX / GLYCOLAX) 17 g packet   Propylene Glycol (SYSTANE BALANCE OP)   QUEtiapine (SEROQUEL) 25 MG tablet   rosuvastatin (CRESTOR) 5 MG tablet   senna (SENOKOT) 8.6 MG TABS tablet   tetrahydrozoline-zinc (VISINE-AC) 0.05-0.25 % ophthalmic solution   tizanidine (ZANAFLEX) 2 MG capsule   traZODone (DESYREL) 50 MG tablet   TRELEGY ELLIPTA 100-62.5-25 MCG/ACT AEPB   WIXELA INHUB 250-50 MCG/ACT AEPB   No current facility-administered medications for this encounter.    Konrad Felix Ward, PA-C [facility] Pre-Surgical Testing [phone]

## 2023-03-19 NOTE — H&P (Signed)
TOTAL KNEE ADMISSION H&P  Patient is being admitted for right total knee arthroplasty.  Subjective:  Chief Complaint:right knee pain.  HPI: Charles Weiss [name]., 72 y.o. male, has a history of pain and functional disability in the right knee due to arthritis and has failed non-surgical conservative treatments for greater than 12 weeks to includeNSAID's and/or analgesics, corticosteriod injections, and activity modification.  Onset of symptoms was gradual, starting 2 years ago with gradually worsening course since that time. The patient noted prior procedures on the knee to include  arthroscopy and menisectomy on the right knee(s).  Patient currently rates pain in the right knee(s) at 8 out of 10 with activity. Patient has worsening of pain with activity and weight bearing, pain that interferes with activities of daily living, and pain with passive range of motion.  Patient has evidence of joint space narrowing by imaging studies. There is no active infection.  Patient Active Problem List   Diagnosis Date Noted   Back pain 11/19/2021   Abdominal pain 99991111   Acute metabolic encephalopathy 99991111   Obesity 11/19/2021   Cervical discitis    Epidural abscess    Discitis of lumbosacral region    Right lower quadrant abdominal pain    Vertebral osteomyelitis (HCC)    MRSA bacteremia    Retropharyngeal abscess    Sepsis ([place]) 11/02/2021   Asthma 10/25/2021   Acute respiratory failure with hypoxia ([place]) 10/25/2021   Influenza A 10/24/2021   Hypertension    Hyperlipidemia    Hypokalemia    Anemia    Hyponatremia    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    COPD (chronic obstructive pulmonary disease) (HCC)    GERD (gastroesophageal reflux disease)    Hyperlipidemia    Hypertension    Neuropathy    Pneumonia due to COVID-19 virus    Pneumonia, bacterial    PONV (postoperative nausea and vomiting)     Pre-diabetes    Prostate cancer (HCC)    PTSD (post-traumatic stress disorder)     Past Surgical History:  Procedure Laterality Date   bilateral shoulder surgery      right knee scope      right wrist surgery      sebacewous cyst surgery       No current facility-administered medications for this encounter.   Current Outpatient Medications  Medication Sig Dispense Refill Last Dose   acetaminophen (TYLENOL) 650 MG CR tablet Take 650 mg by mouth every 8 (eight) hours as needed for pain.   03/12/2023   albuterol (PROVENTIL) (2.5 MG/3ML) 0.083% nebulizer solution Take 3 mLs by nebulization 3 (three) times daily. (Patient taking differently: Take 3 mLs by nebulization every 6 (six) hours as needed for wheezing or shortness of breath.) 100 mL 0 unk   albuterol (VENTOLIN HFA) 108 (90 Base) MCG/ACT inhaler Inhale 1 puff into the lungs every 6 (six) hours as needed for wheezing or shortness of breath.   unk   amLODipine (NORVASC) 5 MG tablet Take 5 mg by mouth daily.   03/11/2023   aspirin EC 81 MG tablet Take 81 mg by mouth as needed. Swallow whole.   03/12/2023   busPIRone (BUSPAR) 10 MG tablet Take 10 mg by mouth 2 (two) times daily.   03/12/2023   ciclesonide (ALVESCO) 80 MCG/ACT inhaler Inhale 1 puff into the lungs as needed.   unk   docusate  sodium (COLACE) 100 MG capsule Take 100 mg by mouth 2 (two) times daily as needed for mild constipation.   unk   gabapentin (NEURONTIN) 800 MG tablet Take 800 mg by mouth 3 (three) times daily.   03/12/2023   hydrochlorothiazide (HYDRODIURIL) 12.5 MG tablet Take 12.5 mg by mouth as needed.   unk   hydroxypropyl methylcellulose / hypromellose (ISOPTO TEARS / GONIOVISC) 2.5 % ophthalmic solution Place 1 drop into both eyes as needed for dry eyes.   unk   ketotifen (ZADITOR) 0.025 % ophthalmic solution Place 1 drop into both eyes 2 (two) times daily as needed (allergies).   unk   losartan (COZAAR) 50 MG tablet Take 50 mg by mouth daily.   03/11/2023    methocarbamol (ROBAXIN) 750 MG tablet Take 750 mg by mouth 2 (two) times daily.   03/12/2023   mometasone (ASMANEX) 220 MCG/ACT inhaler Inhale 2 puffs into the lungs as needed.   unk   montelukast (SINGULAIR) 10 MG tablet Take 10 mg by mouth at bedtime.   03/11/2023   omeprazole (PRILOSEC) 20 MG capsule Take 40 mg by mouth at bedtime.   03/11/2023   oxycodone (OXY-IR) 5 MG capsule Take 5 mg by mouth every 4 (four) hours as needed for pain.   03/12/2023   pantoprazole (PROTONIX) 40 MG tablet Take 40 mg by mouth daily.   03/11/2023   Propylene Glycol (SYSTANE BALANCE OP) Apply 1 drop to eye as needed.   unk   rosuvastatin (CRESTOR) 5 MG tablet Take 5 mg by mouth daily.   03/11/2023   senna (SENOKOT) 8.6 MG TABS tablet Take 1 tablet by mouth at bedtime as needed for mild constipation.   unk   tetrahydrozoline-zinc (VISINE-AC) 0.05-0.25 % ophthalmic solution 2 drops 3 (three) times daily as needed (dry eye).   unk   traZODone (DESYREL) 50 MG tablet Take 50 mg by mouth at bedtime as needed for sleep.   unk   TRELEGY ELLIPTA 100-62.5-25 MCG/ACT AEPB Inhale 1 puff into the lungs as needed.   unk   WIXELA INHUB 250-50 MCG/ACT AEPB Inhale 1 puff into the lungs as needed.   unk   benzonatate (TESSALON) 100 MG capsule Take 1 capsule (100 mg total) by mouth 3 (three) times daily. (Patient not taking: Reported on 01/29/2022) 20 capsule 0 Completed Course   chlorpheniramine-HYDROcodone (TUSSIONEX) 10-8 MG/5ML SUER Take 5 mLs by mouth every 12 (twelve) hours as needed for cough. (Patient not taking: Reported on 01/29/2022) 140 mL 0    Cholecalciferol (VITAMIN D3) 25 MCG (1000 UT) CAPS Take 1,000 Units by mouth daily. (Patient not taking: Reported on 01/29/2022)      dextromethorphan (DELSYM) 30 MG/5ML liquid Take 5 mLs (30 mg total) by mouth 2 (two) times daily as needed for cough. (Patient not taking: Reported on 01/29/2022) 89 mL 0    dextromethorphan-guaiFENesin (MUCINEX DM) 30-600 MG 12hr tablet Take 1 tablet by mouth 2  (two) times daily. (Patient not taking: Reported on 01/29/2022) 60 tablet 0    gabapentin (NEURONTIN) 300 MG capsule Take 1 tablet (300 mg) in the morning and 2 tablets (600 mg) before bed. (Patient not taking: Reported on 03/12/2023) 90 capsule 0 Not Taking   oxyCODONE (ROXICODONE) 15 MG immediate release tablet Take 1 tablet (15 mg total) by mouth every 4 (four) hours as needed for moderate pain or severe pain. (Patient not taking: Reported on 03/12/2023) 20 tablet 0 Not Taking   polyethylene glycol (MIRALAX / GLYCOLAX) 17 g  packet Take 17 g by mouth daily. (Patient not taking: Reported on 03/12/2023) 14 each 0 Not Taking   QUEtiapine (SEROQUEL) 25 MG tablet Take 1 tablet (25 mg total) by mouth at bedtime. (Patient not taking: Reported on 03/12/2023)   Not Taking   tizanidine (ZANAFLEX) 2 MG capsule Take 1 capsule (2 mg total) by mouth at bedtime as needed for muscle spasms. (Patient not taking: Reported on 03/12/2023) 10 capsule 0 Not Taking   Allergies  Allergen Reactions   Elavil [Amitriptyline] Other (See Comments)   Tetracyclines & Related Other (See Comments)    Social History   Tobacco Use   Smoking status: Former    Types: Cigarettes    Quit date: 12/21/2021    Years since quitting: 1.2   Smokeless tobacco: Never   Tobacco comments:    One pack a week   Substance Use Topics   Alcohol use: Not Currently    Family History  Problem Relation Age of Onset   Hypertension Mother    Skin cancer Mother    Osteoporosis Mother    Hypertension Father      Review of Systems  Constitutional:  Negative for chills and fever.  Respiratory:  Negative for cough and shortness of breath.   Cardiovascular:  Negative for chest pain.  Gastrointestinal:  Negative for nausea and vomiting.  Musculoskeletal:  Positive for arthralgias.     Objective:  Physical Exam Well nourished and well developed. General: Alert and oriented x3, cooperative and pleasant, no acute distress. Head: normocephalic,  atraumatic, neck supple. Eyes: EOMI.  Musculoskeletal: Right knee exam: No palpable effusion, warmth erythema Valgus right knee with associated slight flexion contracture with flexion close to 110 degrees with terminal tightness Tenderness laterally Stable medial and lateral collateral ligaments  Calves soft and nontender. Motor function intact in LE. Strength 5/5 LE bilaterally. Neuro: Distal pulses 2+. Sensation to light touch intact in LE.  Vital signs in last 24 hours:    Labs:   Estimated body mass index is 35.59 kg/m as calculated from the following:   Height as of 03/12/23: 5\' 9"  (1.753 m).   Weight as of 03/12/23: 109.3 kg.   Imaging Review Plain radiographs demonstrate severe degenerative joint disease of the right knee(s). The overall alignment isneutral. The bone quality appears to be adequate for age and reported activity level.      Assessment/Plan:  End stage arthritis, right knee   The patient history, physical examination, clinical judgment of the provider and imaging studies are consistent with end stage degenerative joint disease of the right knee(s) and total knee arthroplasty is deemed medically necessary. The treatment options including medical management, injection therapy arthroscopy and arthroplasty were discussed at length. The risks and benefits of total knee arthroplasty were presented and reviewed. The risks due to aseptic loosening, infection, stiffness, patella tracking problems, thromboembolic complications and other imponderables were discussed. The patient acknowledged the explanation, agreed to proceed with the plan and consent was signed. Patient is being admitted for inpatient treatment for surgery, pain control, PT, OT, prophylactic antibiotics, VTE prophylaxis, progressive ambulation and ADL's and discharge planning. The patient is planning to be discharged  home.  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 - claustrophobic - house arrest ankle bracelet to be removed for surgery - we wrote a note for this - Oxycodone 5 mg 4-5 times daily -- chronic pain , likely will need 10-15 q4h, robaxin, tylenol   Patient's anticipated LOS is less than 2 midnights, meeting these requirements: - Younger than 62 - Lives within 1 hour of care - Has a competent adult at home to recover with post-op recover - NO history of  - Chronic pain requiring opiods  - Diabetes  - Coronary Artery Disease  - Heart failure  - Heart attack  - Stroke  - DVT/VTE  - Cardiac arrhythmia  - Respiratory Failure/COPD  - Renal failure  - Anemia  - Advanced Liver disease  Costella Hatcher, PA-C Orthopedic Surgery [facility] [place] [phone]

## 2023-03-22 NOTE — Progress Notes (Signed)
PT called and LVMM on preop nurses phone and stated on Easter Sunday he has fever of 101 and nausea and vomiting.  Hulen Skains and LVMM for Orson Slick on 03/22/2023 at 0735 am at [place] in regards to this.

## 2023-03-22 NOTE — Progress Notes (Signed)
[name] from [place] office called back and stated [name] was aware of fever and N/V.  PT reports today he has no fever.  States ok to proceed with surgery.

## 2023-03-22 NOTE — Progress Notes (Signed)
PT called back this am  ( on 03/22/23 0 and stated fever broke.  Instructed pt to notify surgeon office today.  Informed pt that preop nurse had already left a nessage for surgery scheduler at office.  Pt voiced understandIng.

## 2023-03-23 ENCOUNTER — Ambulatory Visit (HOSPITAL_COMMUNITY): Payer: 59 | Admitting: Physician Assistant

## 2023-03-23 ENCOUNTER — Encounter (HOSPITAL_COMMUNITY)
Admission: AD | Disposition: A | Discharge status: Home / Self Care | Payer: Self-pay | Source: Home / Self Care | Attending: Orthopedic Surgery

## 2023-03-23 ENCOUNTER — Other Ambulatory Visit: Payer: Self-pay

## 2023-03-23 ENCOUNTER — Inpatient Hospital Stay (HOSPITAL_COMMUNITY)
Admission: AD | Admit: 2023-03-23 | Discharge: 2023-03-30 | DRG: 470 | Disposition: A | Discharge status: Home / Self Care | Payer: 59 | Attending: Orthopedic Surgery | Admitting: Orthopedic Surgery

## 2023-03-23 ENCOUNTER — Encounter (HOSPITAL_COMMUNITY): Payer: Self-pay | Admitting: Orthopedic Surgery

## 2023-03-23 DIAGNOSIS — I10 Essential (primary) hypertension: Secondary | ICD-10-CM

## 2023-03-23 DIAGNOSIS — Z883 Allergy status to other anti-infective agents status: Secondary | ICD-10-CM

## 2023-03-23 DIAGNOSIS — D62 Acute posthemorrhagic anemia: Secondary | ICD-10-CM | POA: Diagnosis not present

## 2023-03-23 DIAGNOSIS — Z8262 Family history of osteoporosis: Secondary | ICD-10-CM

## 2023-03-23 DIAGNOSIS — Z87891 Personal history of nicotine dependence: Secondary | ICD-10-CM

## 2023-03-23 DIAGNOSIS — Z91041 Radiographic dye allergy status: Secondary | ICD-10-CM

## 2023-03-23 DIAGNOSIS — M659 Synovitis and tenosynovitis, unspecified: Secondary | ICD-10-CM | POA: Diagnosis present

## 2023-03-23 DIAGNOSIS — E785 Hyperlipidemia, unspecified: Secondary | ICD-10-CM | POA: Diagnosis present

## 2023-03-23 DIAGNOSIS — K219 Gastro-esophageal reflux disease without esophagitis: Secondary | ICD-10-CM | POA: Diagnosis present

## 2023-03-23 DIAGNOSIS — M1711 Unilateral primary osteoarthritis, right knee: Secondary | ICD-10-CM

## 2023-03-23 DIAGNOSIS — D649 Anemia, unspecified: Secondary | ICD-10-CM

## 2023-03-23 DIAGNOSIS — R7303 Prediabetes: Secondary | ICD-10-CM | POA: Diagnosis present

## 2023-03-23 DIAGNOSIS — Z888 Allergy status to other drugs, medicaments and biological substances status: Secondary | ICD-10-CM

## 2023-03-23 DIAGNOSIS — Z01818 Encounter for other preprocedural examination: Secondary | ICD-10-CM

## 2023-03-23 DIAGNOSIS — Z8616 Personal history of COVID-19: Secondary | ICD-10-CM

## 2023-03-23 DIAGNOSIS — Z1152 Encounter for screening for COVID-19: Secondary | ICD-10-CM

## 2023-03-23 DIAGNOSIS — J4489 Other specified chronic obstructive pulmonary disease: Secondary | ICD-10-CM | POA: Diagnosis present

## 2023-03-23 DIAGNOSIS — Z79899 Other long term (current) drug therapy: Secondary | ICD-10-CM

## 2023-03-23 DIAGNOSIS — Z6835 Body mass index (BMI) 35.0-35.9, adult: Secondary | ICD-10-CM

## 2023-03-23 DIAGNOSIS — Z8249 Family history of ischemic heart disease and other diseases of the circulatory system: Secondary | ICD-10-CM

## 2023-03-23 DIAGNOSIS — J449 Chronic obstructive pulmonary disease, unspecified: Secondary | ICD-10-CM | POA: Diagnosis not present

## 2023-03-23 DIAGNOSIS — E669 Obesity, unspecified: Secondary | ICD-10-CM | POA: Diagnosis present

## 2023-03-23 DIAGNOSIS — Z8546 Personal history of malignant neoplasm of prostate: Secondary | ICD-10-CM

## 2023-03-23 DIAGNOSIS — Z96651 Presence of right artificial knee joint: Principal | ICD-10-CM

## 2023-03-23 DIAGNOSIS — F4310 Post-traumatic stress disorder, unspecified: Secondary | ICD-10-CM | POA: Diagnosis present

## 2023-03-23 DIAGNOSIS — Z808 Family history of malignant neoplasm of other organs or systems: Secondary | ICD-10-CM

## 2023-03-23 HISTORY — PX: TOTAL KNEE ARTHROPLASTY: SHX125

## 2023-03-23 SURGERY — ARTHROPLASTY, KNEE, TOTAL
Anesthesia: General | Site: Knee | Laterality: Right

## 2023-03-23 MED ORDER — FENTANYL CITRATE PF 50 MCG/ML IJ SOSY
25.0000 ug | PREFILLED_SYRINGE | INTRAMUSCULAR | Status: DC | PRN
  Administered 2023-03-23 (×3): 50 ug via INTRAVENOUS

## 2023-03-23 MED ORDER — BUSPIRONE HCL 10 MG PO TABS
10.0000 mg | ORAL_TABLET | Freq: Two times a day (BID) | ORAL | Status: DC
  Administered 2023-03-23 – 2023-03-30 (×14): 10 mg via ORAL
  Filled 2023-03-23 (×14): qty 1

## 2023-03-23 MED ORDER — ONDANSETRON HCL 4 MG/2ML IJ SOLN: 4.0000 mg | Freq: Four times a day (QID) | INTRAMUSCULAR | Status: DC | PRN

## 2023-03-23 MED ORDER — POLYETHYLENE GLYCOL 3350 17 G PO PACK
17.0000 g | PACK | Freq: Two times a day (BID) | ORAL | Status: DC
  Administered 2023-03-23 – 2023-03-28 (×8): 17 g via ORAL
  Filled 2023-03-23 (×10): qty 1

## 2023-03-23 MED ORDER — SODIUM CHLORIDE 0.9 % IV SOLN: INTRAVENOUS | Status: DC

## 2023-03-23 MED ORDER — ACETAMINOPHEN 500 MG PO TABS
1000.0000 mg | ORAL_TABLET | Freq: Four times a day (QID) | ORAL | Status: AC
  Administered 2023-03-23 – 2023-03-24 (×4): 1000 mg via ORAL
  Filled 2023-03-23 (×4): qty 2

## 2023-03-23 MED ORDER — MIDAZOLAM HCL 2 MG/2ML IJ SOLN
INTRAMUSCULAR | Status: AC
  Filled 2023-03-23: qty 2

## 2023-03-23 MED ORDER — ORAL CARE MOUTH RINSE: 15.0000 mL | Freq: Once | OROMUCOSAL | Status: AC

## 2023-03-23 MED ORDER — BUPIVACAINE-EPINEPHRINE (PF) 0.25% -1:200000 IJ SOLN
INTRAMUSCULAR | Status: DC | PRN
  Administered 2023-03-23: 30 mL

## 2023-03-23 MED ORDER — FLUTICASONE FUROATE-VILANTEROL 100-25 MCG/ACT IN AEPB
1.0000 | INHALATION_SPRAY | Freq: Every day | RESPIRATORY_TRACT | Status: DC | PRN
  Filled 2023-03-23: qty 28

## 2023-03-23 MED ORDER — BUDESONIDE 0.25 MG/2ML IN SUSP: 0.2500 mg | Freq: Two times a day (BID) | RESPIRATORY_TRACT | Status: DC | PRN

## 2023-03-23 MED ORDER — ACETAMINOPHEN 325 MG PO TABS
325.0000 mg | ORAL_TABLET | Freq: Four times a day (QID) | ORAL | Status: DC | PRN
  Administered 2023-03-25 – 2023-03-27 (×3): 650 mg via ORAL
  Filled 2023-03-23 (×4): qty 2

## 2023-03-23 MED ORDER — AMISULPRIDE (ANTIEMETIC) 5 MG/2ML IV SOLN: 10.0000 mg | Freq: Once | INTRAVENOUS | Status: DC | PRN

## 2023-03-23 MED ORDER — GABAPENTIN 400 MG PO CAPS: 800.0000 mg | ORAL_CAPSULE | Freq: Three times a day (TID) | ORAL | Status: DC

## 2023-03-23 MED ORDER — FENTANYL CITRATE (PF) 100 MCG/2ML IJ SOLN
INTRAMUSCULAR | Status: DC | PRN
  Administered 2023-03-23: 100 ug via INTRAVENOUS

## 2023-03-23 MED ORDER — GABAPENTIN 400 MG PO CAPS
800.0000 mg | ORAL_CAPSULE | Freq: Three times a day (TID) | ORAL | Status: DC
  Administered 2023-03-24 – 2023-03-30 (×20): 800 mg via ORAL
  Filled 2023-03-23 (×20): qty 2

## 2023-03-23 MED ORDER — DEXAMETHASONE SODIUM PHOSPHATE 10 MG/ML IJ SOLN
10.0000 mg | Freq: Once | INTRAMUSCULAR | Status: AC
  Administered 2023-03-24: 10 mg via INTRAVENOUS
  Filled 2023-03-23: qty 1

## 2023-03-23 MED ORDER — DEXAMETHASONE SODIUM PHOSPHATE 10 MG/ML IJ SOLN
INTRAMUSCULAR | Status: AC
  Filled 2023-03-23: qty 1

## 2023-03-23 MED ORDER — POVIDONE-IODINE 10 % EX SWAB: 2.0000 | Freq: Once | CUTANEOUS | Status: DC

## 2023-03-23 MED ORDER — GABAPENTIN 400 MG PO CAPS
400.0000 mg | ORAL_CAPSULE | Freq: Once | ORAL | Status: AC
  Administered 2023-03-23: 400 mg via ORAL
  Filled 2023-03-23: qty 1

## 2023-03-23 MED ORDER — BUPIVACAINE HCL (PF) 0.25 % IJ SOLN
INTRAMUSCULAR | Status: AC
  Filled 2023-03-23: qty 30

## 2023-03-23 MED ORDER — ALBUTEROL SULFATE (2.5 MG/3ML) 0.083% IN NEBU: 3.0000 mL | INHALATION_SOLUTION | Freq: Four times a day (QID) | RESPIRATORY_TRACT | Status: DC | PRN

## 2023-03-23 MED ORDER — CEFAZOLIN SODIUM-DEXTROSE 2-4 GM/100ML-% IV SOLN
2.0000 g | Freq: Four times a day (QID) | INTRAVENOUS | Status: AC
  Administered 2023-03-23 – 2023-03-24 (×2): 2 g via INTRAVENOUS
  Filled 2023-03-23 (×2): qty 100

## 2023-03-23 MED ORDER — LACTATED RINGERS IV SOLN: INTRAVENOUS | Status: DC

## 2023-03-23 MED ORDER — LIDOCAINE HCL (PF) 2 % IJ SOLN
INTRAMUSCULAR | Status: AC
  Filled 2023-03-23: qty 5

## 2023-03-23 MED ORDER — SODIUM CHLORIDE (PF) 0.9 % IJ SOLN
INTRAMUSCULAR | Status: AC
  Filled 2023-03-23: qty 30

## 2023-03-23 MED ORDER — OXYCODONE HCL 5 MG PO TABS
10.0000 mg | ORAL_TABLET | ORAL | Status: DC | PRN
  Administered 2023-03-23 – 2023-03-25 (×4): 10 mg via ORAL
  Administered 2023-03-25 (×2): 15 mg via ORAL
  Filled 2023-03-23: qty 2
  Filled 2023-03-23: qty 3
  Filled 2023-03-23: qty 2
  Filled 2023-03-23: qty 3
  Filled 2023-03-23 (×4): qty 2
  Filled 2023-03-23: qty 3
  Filled 2023-03-23: qty 2

## 2023-03-23 MED ORDER — PROPOFOL 10 MG/ML IV BOLUS
INTRAVENOUS | Status: DC | PRN
  Administered 2023-03-23: 160 mg via INTRAVENOUS

## 2023-03-23 MED ORDER — DEXAMETHASONE SODIUM PHOSPHATE 10 MG/ML IJ SOLN
8.0000 mg | Freq: Once | INTRAMUSCULAR | Status: AC
  Administered 2023-03-23: 10 mg via INTRAVENOUS

## 2023-03-23 MED ORDER — UMECLIDINIUM BROMIDE 62.5 MCG/ACT IN AEPB
1.0000 | INHALATION_SPRAY | Freq: Every day | RESPIRATORY_TRACT | Status: DC | PRN
  Filled 2023-03-23: qty 7

## 2023-03-23 MED ORDER — FENTANYL CITRATE (PF) 100 MCG/2ML IJ SOLN
INTRAMUSCULAR | Status: AC
  Filled 2023-03-23: qty 2

## 2023-03-23 MED ORDER — GABAPENTIN 300 MG PO CAPS
300.0000 mg | ORAL_CAPSULE | Freq: Three times a day (TID) | ORAL | Status: DC
  Administered 2023-03-23: 300 mg via ORAL
  Filled 2023-03-23: qty 1

## 2023-03-23 MED ORDER — OXYCODONE HCL 5 MG/5ML PO SOLN: 5.0000 mg | Freq: Once | ORAL | Status: DC | PRN

## 2023-03-23 MED ORDER — BISACODYL 10 MG RE SUPP
10.0000 mg | Freq: Every day | RECTAL | Status: DC | PRN
  Administered 2023-03-26 – 2023-03-27 (×2): 10 mg via RECTAL
  Filled 2023-03-23 (×2): qty 1

## 2023-03-23 MED ORDER — GABAPENTIN 800 MG PO TABS: 800.0000 mg | ORAL_TABLET | Freq: Three times a day (TID) | ORAL | Status: DC

## 2023-03-23 MED ORDER — ACETAMINOPHEN 500 MG PO TABS
1000.0000 mg | ORAL_TABLET | Freq: Once | ORAL | Status: AC
  Administered 2023-03-23: 1000 mg via ORAL
  Filled 2023-03-23: qty 2

## 2023-03-23 MED ORDER — ROCURONIUM BROMIDE 10 MG/ML (PF) SYRINGE
PREFILLED_SYRINGE | INTRAVENOUS | Status: DC | PRN
  Administered 2023-03-23: 60 mg via INTRAVENOUS

## 2023-03-23 MED ORDER — GABAPENTIN 300 MG PO CAPS: 600.0000 mg | ORAL_CAPSULE | Freq: Four times a day (QID) | ORAL | Status: DC | PRN

## 2023-03-23 MED ORDER — PANTOPRAZOLE SODIUM 40 MG PO TBEC
40.0000 mg | DELAYED_RELEASE_TABLET | Freq: Every day | ORAL | Status: DC
  Administered 2023-03-24 – 2023-03-28 (×5): 40 mg via ORAL
  Filled 2023-03-23 (×5): qty 1

## 2023-03-23 MED ORDER — KETOROLAC TROMETHAMINE 30 MG/ML IJ SOLN
INTRAMUSCULAR | Status: AC
  Filled 2023-03-23: qty 1

## 2023-03-23 MED ORDER — HYDROMORPHONE HCL 1 MG/ML IJ SOLN
INTRAMUSCULAR | Status: AC
  Filled 2023-03-23: qty 1

## 2023-03-23 MED ORDER — HYDROMORPHONE HCL 2 MG/ML IJ SOLN
INTRAMUSCULAR | Status: AC
  Filled 2023-03-23: qty 1

## 2023-03-23 MED ORDER — ROPIVACAINE HCL 5 MG/ML IJ SOLN
INTRAMUSCULAR | Status: DC | PRN
  Administered 2023-03-23: 20 mL via PERINEURAL

## 2023-03-23 MED ORDER — PROPOFOL 10 MG/ML IV BOLUS
INTRAVENOUS | Status: AC
  Filled 2023-03-23: qty 20

## 2023-03-23 MED ORDER — METOCLOPRAMIDE HCL 5 MG/ML IJ SOLN: 5.0000 mg | Freq: Three times a day (TID) | INTRAMUSCULAR | Status: DC | PRN

## 2023-03-23 MED ORDER — METHOCARBAMOL 1000 MG/10ML IJ SOLN
500.0000 mg | Freq: Two times a day (BID) | INTRAVENOUS | Status: DC
  Filled 2023-03-23: qty 5

## 2023-03-23 MED ORDER — LOSARTAN POTASSIUM 50 MG PO TABS
50.0000 mg | ORAL_TABLET | Freq: Every day | ORAL | Status: DC
  Administered 2023-03-24 – 2023-03-30 (×6): 50 mg via ORAL
  Filled 2023-03-23 (×7): qty 1

## 2023-03-23 MED ORDER — METOCLOPRAMIDE HCL 5 MG PO TABS: 5.0000 mg | ORAL_TABLET | Freq: Three times a day (TID) | ORAL | Status: DC | PRN

## 2023-03-23 MED ORDER — OXYCODONE HCL 5 MG PO TABS: 5.0000 mg | ORAL_TABLET | Freq: Once | ORAL | Status: DC | PRN

## 2023-03-23 MED ORDER — DIPHENHYDRAMINE HCL 12.5 MG/5ML PO ELIX: 12.5000 mg | ORAL_SOLUTION | ORAL | Status: DC | PRN

## 2023-03-23 MED ORDER — ROCURONIUM BROMIDE 10 MG/ML (PF) SYRINGE
PREFILLED_SYRINGE | INTRAVENOUS | Status: AC
  Filled 2023-03-23: qty 10

## 2023-03-23 MED ORDER — OXYCODONE HCL 5 MG PO TABS
5.0000 mg | ORAL_TABLET | ORAL | Status: DC | PRN
  Administered 2023-03-23 – 2023-03-24 (×2): 10 mg via ORAL
  Administered 2023-03-24: 5 mg via ORAL
  Administered 2023-03-24: 10 mg via ORAL
  Administered 2023-03-25: 5 mg via ORAL
  Administered 2023-03-25: 10 mg via ORAL
  Administered 2023-03-26: 5 mg via ORAL
  Filled 2023-03-23 (×4): qty 2
  Filled 2023-03-23 (×2): qty 1

## 2023-03-23 MED ORDER — MONTELUKAST SODIUM 10 MG PO TABS
10.0000 mg | ORAL_TABLET | Freq: Every day | ORAL | Status: DC
  Administered 2023-03-23 – 2023-03-29 (×7): 10 mg via ORAL
  Filled 2023-03-23 (×7): qty 1

## 2023-03-23 MED ORDER — ONDANSETRON HCL 4 MG PO TABS
4.0000 mg | ORAL_TABLET | Freq: Four times a day (QID) | ORAL | Status: DC | PRN
  Administered 2023-03-28: 4 mg via ORAL
  Filled 2023-03-23: qty 1

## 2023-03-23 MED ORDER — GABAPENTIN 100 MG PO CAPS
100.0000 mg | ORAL_CAPSULE | Freq: Once | ORAL | Status: AC
  Administered 2023-03-23: 100 mg via ORAL
  Filled 2023-03-23: qty 1

## 2023-03-23 MED ORDER — MENTHOL 3 MG MT LOZG: 1.0000 | LOZENGE | OROMUCOSAL | Status: DC | PRN

## 2023-03-23 MED ORDER — TRAZODONE HCL 50 MG PO TABS
50.0000 mg | ORAL_TABLET | Freq: Every evening | ORAL | Status: DC | PRN
  Administered 2023-03-23 – 2023-03-29 (×6): 50 mg via ORAL
  Filled 2023-03-23 (×6): qty 1

## 2023-03-23 MED ORDER — VANCOMYCIN HCL 1500 MG/300ML IV SOLN
1500.0000 mg | INTRAVENOUS | Status: AC
  Administered 2023-03-23: 1500 mg via INTRAVENOUS
  Filled 2023-03-23: qty 300

## 2023-03-23 MED ORDER — ASPIRIN 81 MG PO CHEW
81.0000 mg | CHEWABLE_TABLET | Freq: Two times a day (BID) | ORAL | Status: DC
  Administered 2023-03-23 – 2023-03-30 (×14): 81 mg via ORAL
  Filled 2023-03-23 (×14): qty 1

## 2023-03-23 MED ORDER — SUGAMMADEX SODIUM 500 MG/5ML IV SOLN
INTRAVENOUS | Status: DC | PRN
  Administered 2023-03-23: 400 mg via INTRAVENOUS

## 2023-03-23 MED ORDER — PHENOL 1.4 % MT LIQD: 1.0000 | OROMUCOSAL | Status: DC | PRN

## 2023-03-23 MED ORDER — HYDROMORPHONE HCL 1 MG/ML IJ SOLN
0.2500 mg | INTRAMUSCULAR | Status: DC | PRN
  Administered 2023-03-23 (×4): 0.5 mg via INTRAVENOUS

## 2023-03-23 MED ORDER — FENTANYL CITRATE PF 50 MCG/ML IJ SOSY
PREFILLED_SYRINGE | INTRAMUSCULAR | Status: AC
  Filled 2023-03-23: qty 1

## 2023-03-23 MED ORDER — CEFAZOLIN SODIUM-DEXTROSE 2-4 GM/100ML-% IV SOLN
2.0000 g | INTRAVENOUS | Status: AC
  Administered 2023-03-23: 2 g via INTRAVENOUS
  Filled 2023-03-23: qty 100

## 2023-03-23 MED ORDER — METHOCARBAMOL 500 MG PO TABS
750.0000 mg | ORAL_TABLET | Freq: Two times a day (BID) | ORAL | Status: DC
  Administered 2023-03-24 – 2023-03-27 (×8): 750 mg via ORAL
  Filled 2023-03-23 (×8): qty 2

## 2023-03-23 MED ORDER — FENTANYL CITRATE PF 50 MCG/ML IJ SOSY
PREFILLED_SYRINGE | INTRAMUSCULAR | Status: AC
  Filled 2023-03-23: qty 2

## 2023-03-23 MED ORDER — SODIUM CHLORIDE (PF) 0.9 % IJ SOLN
INTRAMUSCULAR | Status: DC | PRN
  Administered 2023-03-23: 30 mL

## 2023-03-23 MED ORDER — MIDAZOLAM HCL 5 MG/5ML IJ SOLN
INTRAMUSCULAR | Status: DC | PRN
  Administered 2023-03-23 (×2): 1 mg via INTRAVENOUS

## 2023-03-23 MED ORDER — ROSUVASTATIN CALCIUM 5 MG PO TABS
5.0000 mg | ORAL_TABLET | Freq: Every day | ORAL | Status: DC
  Administered 2023-03-23 – 2023-03-30 (×8): 5 mg via ORAL
  Filled 2023-03-23 (×8): qty 1

## 2023-03-23 MED ORDER — METHOCARBAMOL 1000 MG/10ML IJ SOLN: 500.0000 mg | Freq: Four times a day (QID) | INTRAVENOUS | Status: DC | PRN

## 2023-03-23 MED ORDER — TRANEXAMIC ACID-NACL 1000-0.7 MG/100ML-% IV SOLN
1000.0000 mg | Freq: Once | INTRAVENOUS | Status: AC
  Administered 2023-03-23: 1000 mg via INTRAVENOUS
  Filled 2023-03-23: qty 100

## 2023-03-23 MED ORDER — METHOCARBAMOL 500 MG PO TABS
500.0000 mg | ORAL_TABLET | Freq: Four times a day (QID) | ORAL | Status: DC | PRN
  Administered 2023-03-23: 500 mg via ORAL
  Filled 2023-03-23: qty 1

## 2023-03-23 MED ORDER — TRANEXAMIC ACID-NACL 1000-0.7 MG/100ML-% IV SOLN
1000.0000 mg | INTRAVENOUS | Status: AC
  Administered 2023-03-23: 1000 mg via INTRAVENOUS
  Filled 2023-03-23: qty 100

## 2023-03-23 MED ORDER — ONDANSETRON HCL 4 MG/2ML IJ SOLN
INTRAMUSCULAR | Status: AC
  Filled 2023-03-23: qty 2

## 2023-03-23 MED ORDER — HYDROMORPHONE HCL 1 MG/ML IJ SOLN
0.5000 mg | INTRAMUSCULAR | Status: DC | PRN
  Administered 2023-03-25: 1 mg via INTRAVENOUS
  Filled 2023-03-23 (×2): qty 1

## 2023-03-23 MED ORDER — MUPIROCIN 2 % EX OINT
1.0000 | TOPICAL_OINTMENT | Freq: Once | CUTANEOUS | Status: DC
  Filled 2023-03-23: qty 22

## 2023-03-23 MED ORDER — AMLODIPINE BESYLATE 5 MG PO TABS
5.0000 mg | ORAL_TABLET | Freq: Every day | ORAL | Status: DC
  Administered 2023-03-24 – 2023-03-30 (×6): 5 mg via ORAL
  Filled 2023-03-23 (×7): qty 1

## 2023-03-23 MED ORDER — SODIUM CHLORIDE 0.9 % IR SOLN
Status: DC | PRN
  Administered 2023-03-23: 1000 mL

## 2023-03-23 MED ORDER — HYDROMORPHONE HCL 1 MG/ML IJ SOLN
INTRAMUSCULAR | Status: DC | PRN
  Administered 2023-03-23 (×2): 1 mg via INTRAVENOUS

## 2023-03-23 MED ORDER — DOCUSATE SODIUM 100 MG PO CAPS
100.0000 mg | ORAL_CAPSULE | Freq: Two times a day (BID) | ORAL | Status: DC
  Administered 2023-03-23 – 2023-03-30 (×12): 100 mg via ORAL
  Filled 2023-03-23 (×13): qty 1

## 2023-03-23 MED ORDER — ONDANSETRON HCL 4 MG/2ML IJ SOLN
INTRAMUSCULAR | Status: DC | PRN
  Administered 2023-03-23: 4 mg via INTRAVENOUS

## 2023-03-23 MED ORDER — MIDAZOLAM HCL 2 MG/2ML IJ SOLN: 2.0000 mg | INTRAMUSCULAR | Status: DC

## 2023-03-23 MED ORDER — FENTANYL CITRATE PF 50 MCG/ML IJ SOSY
100.0000 ug | PREFILLED_SYRINGE | INTRAMUSCULAR | Status: DC
  Administered 2023-03-23: 50 ug via INTRAVENOUS
  Filled 2023-03-23: qty 2

## 2023-03-23 MED ORDER — CHLORHEXIDINE GLUCONATE 0.12 % MT SOLN
15.0000 mL | Freq: Once | OROMUCOSAL | Status: AC
  Administered 2023-03-23: 15 mL via OROMUCOSAL

## 2023-03-23 MED ORDER — LIDOCAINE 2% (20 MG/ML) 5 ML SYRINGE
INTRAMUSCULAR | Status: DC | PRN
  Administered 2023-03-23: 60 mg via INTRAVENOUS

## 2023-03-23 MED ORDER — 0.9 % SODIUM CHLORIDE (POUR BTL) OPTIME
TOPICAL | Status: DC | PRN
  Administered 2023-03-23: 1000 mL

## 2023-03-23 MED ORDER — KETOROLAC TROMETHAMINE 30 MG/ML IJ SOLN
INTRAMUSCULAR | Status: DC | PRN
  Administered 2023-03-23: 30 mg

## 2023-03-23 SURGICAL SUPPLY — 58 items
ADH SKN CLS APL DERMABOND .7 (GAUZE/BANDAGES/DRESSINGS) ×1
ATTUNE MED ANAT PAT 38 KNEE (Knees) IMPLANT
BAG COUNTER SPONGE SURGICOUNT (BAG) IMPLANT
BAG SPEC THK2 15X12 ZIP CLS (MISCELLANEOUS) ×1
BAG SPNG CNTER NS LX DISP (BAG) ×1
BAG ZIPLOCK 12X15 (MISCELLANEOUS) IMPLANT
BASEPLATE TIB CMT FB PCKT SZ6 (Knees) IMPLANT
BLADE SAW SGTL 11.0X1.19X90.0M (BLADE) IMPLANT
BLADE SAW SGTL 13.0X1.19X90.0M (BLADE) ×1 IMPLANT
BNDG CMPR 5X62 HK CLSR LF (GAUZE/BANDAGES/DRESSINGS) ×1
BNDG CMPR MED 10X6 ELC LF (GAUZE/BANDAGES/DRESSINGS) ×1
BNDG ELASTIC 6INX 5YD STR LF (GAUZE/BANDAGES/DRESSINGS) ×1 IMPLANT
BNDG ELASTIC 6X10 VLCR STRL LF (GAUZE/BANDAGES/DRESSINGS) IMPLANT
BOWL SMART MIX CTS (DISPOSABLE) ×1 IMPLANT
BSPLAT TIB 6 CMNT FXBRNG STRL (Knees) ×1 IMPLANT
CEMENT HV SMART SET (Cement) IMPLANT
COMP FEM CMT ATTUNE KNEE 7 RT (Joint) ×1 IMPLANT
COMPONENT FEM CMT ATTN KN 7 RT (Joint) IMPLANT
COVER SURGICAL LIGHT HANDLE (MISCELLANEOUS) ×1 IMPLANT
CUFF TOURN SGL QUICK 34 (TOURNIQUET CUFF) ×1
CUFF TRNQT CYL 34X4.125X (TOURNIQUET CUFF) ×1 IMPLANT
DERMABOND ADVANCED .7 DNX12 (GAUZE/BANDAGES/DRESSINGS) ×1 IMPLANT
DRAPE U-SHAPE 47X51 STRL (DRAPES) ×1 IMPLANT
DRESSING AQUACEL AG SP 3.5X10 (GAUZE/BANDAGES/DRESSINGS) ×1 IMPLANT
DRSG AQUACEL AG ADV 3.5X10 (GAUZE/BANDAGES/DRESSINGS) IMPLANT
DRSG AQUACEL AG SP 3.5X10 (GAUZE/BANDAGES/DRESSINGS) ×1
DURAPREP 26ML APPLICATOR (WOUND CARE) ×2 IMPLANT
ELECT REM PT RETURN 15FT ADLT (MISCELLANEOUS) ×1 IMPLANT
GLOVE BIO SURGEON STRL SZ 6 (GLOVE) ×1 IMPLANT
GLOVE BIOGEL PI IND STRL 6.5 (GLOVE) ×1 IMPLANT
GLOVE BIOGEL PI IND STRL 7.5 (GLOVE) ×1 IMPLANT
GLOVE ORTHO TXT STRL SZ7.5 (GLOVE) ×2 IMPLANT
GOWN STRL REUS W/ TWL LRG LVL3 (GOWN DISPOSABLE) ×2 IMPLANT
GOWN STRL REUS W/TWL LRG LVL3 (GOWN DISPOSABLE) ×2
HANDPIECE INTERPULSE COAX TIP (DISPOSABLE) ×1
HOLDER FOLEY CATH W/STRAP (MISCELLANEOUS) IMPLANT
INSERT MED CMT ATTUNE 7 6 RT (Insert) IMPLANT
KIT TURNOVER KIT A (KITS) IMPLANT
MANIFOLD NEPTUNE II (INSTRUMENTS) ×1 IMPLANT
NDL SAFETY ECLIP 18X1.5 (MISCELLANEOUS) IMPLANT
NS IRRIG 1000ML POUR BTL (IV SOLUTION) ×1 IMPLANT
PACK TOTAL KNEE CUSTOM (KITS) ×1 IMPLANT
PIN FIX SIGMA LCS THRD HI (PIN) IMPLANT
PROTECTOR NERVE ULNAR (MISCELLANEOUS) ×1 IMPLANT
SET HNDPC FAN SPRY TIP SCT (DISPOSABLE) ×1 IMPLANT
SET PAD KNEE POSITIONER (MISCELLANEOUS) ×1 IMPLANT
SPIKE FLUID TRANSFER (MISCELLANEOUS) ×2 IMPLANT
SUT MNCRL AB 4-0 PS2 18 (SUTURE) ×1 IMPLANT
SUT STRATAFIX PDS+ 0 24IN (SUTURE) ×1 IMPLANT
SUT VIC AB 1 CT1 36 (SUTURE) ×1 IMPLANT
SUT VIC AB 2-0 CT1 27 (SUTURE) ×2
SUT VIC AB 2-0 CT1 TAPERPNT 27 (SUTURE) ×2 IMPLANT
SYR 3ML LL SCALE MARK (SYRINGE) ×1 IMPLANT
TOWEL GREEN STERILE FF (TOWEL DISPOSABLE) ×1 IMPLANT
TRAY FOLEY MTR SLVR 16FR STAT (SET/KITS/TRAYS/PACK) ×1 IMPLANT
TUBE SUCTION HIGH CAP CLEAR NV (SUCTIONS) ×1 IMPLANT
WATER STERILE IRR 1000ML POUR (IV SOLUTION) ×2 IMPLANT
WRAP KNEE MAXI GEL POST OP (GAUZE/BANDAGES/DRESSINGS) ×1 IMPLANT

## 2023-03-23 NOTE — Op Note (Signed)
NAME:  Charles Weiss [name].                      MEDICAL RECORD NO.:  [id number]                             FACILITY:  [facility]      PHYSICIAN:  Pietro Cassis. Alvan Dame, M.D.  DATE OF BIRTH:  June 15, 1951      DATE OF PROCEDURE:  03/23/2023                                     OPERATIVE REPORT         PREOPERATIVE DIAGNOSIS:  Right knee osteoarthritis.      POSTOPERATIVE DIAGNOSIS:  Right knee osteoarthritis.      FINDINGS:  The patient was noted to have complete loss of cartilage and   bone-on-bone arthritis with associated osteophytes in the medial and patellofemoral compartments of   the knee with a significant synovitis and associated effusion.  The patient had failed months of conservative treatment including medications, injection therapy, activity modification.     PROCEDURE:  Right total knee replacement.      COMPONENTS USED:  DePuy Attune FB CR MS knee   system, a size 7 femur, 6 tibia, size 6 mm CR MS AOX insert, and 38 anatomic patellar   button.      SURGEON:  Pietro Cassis. Alvan Dame, M.D.      ASSISTANT:  Costella Hatcher, PA-C.      ANESTHESIA:  Regional and Spinal.      SPECIMENS:  None.      COMPLICATION:  None.      DRAINS:  None.  EBL: <150 cc      TOURNIQUET TIME:  35 min at 225 mmHg     The patient was stable to the recovery room.      INDICATION FOR PROCEDURE:  Charles Weiss. is a 72 y.o. male patient of   mine.  The patient had been seen, evaluated, and treated for months conservatively in the   office with medication, activity modification, and injections.  The patient had   radiographic changes of bone-on-bone arthritis with endplate sclerosis and osteophytes noted.  Based on the radiographic changes and failed conservative measures, the patient   decided to proceed with definitive treatment, total knee replacement.  Risks of infection, DVT, component failure, need for revision surgery, neurovascular injury were reviewed in the office setting.  The  postop course was reviewed stressing the efforts to maximize post-operative satisfaction and function.  Consent was obtained for benefit of pain   relief.      PROCEDURE IN DETAIL:  The patient was brought to the operative theater.   Once adequate anesthesia, preoperative antibiotics, 2 gm of Ancef,1 gm of Tranexamic Acid, and 10 mg of Decadron administered, the patient was positioned supine with a right thigh tourniquet placed.  The  right lower extremity was prepped and draped in sterile fashion.  A time-   out was performed identifying the patient, planned procedure, and the appropriate extremity.      The right lower extremity was placed in the [facility] leg holder.  The leg was   exsanguinated, tourniquet elevated to 225 mmHg.  A midline incision was   made followed by median parapatellar arthrotomy.  Following initial   exposure,  attention was first directed to the patella.  Precut   measurement was noted to be 25 mm.  I resected down to 14 mm and used a   38 anatomic patellar button to restore patellar height as well as cover the cut surface.      The lug holes were drilled and a metal shim was placed to protect the   patella from retractors and saw blade during the procedure.      At this point, attention was now directed to the femur.  The femoral   canal was opened with a drill, irrigated to try to prevent fat emboli.  An   intramedullary rod was passed at 5 degrees valgus, 9 mm of bone was   resected off the distal femur.  Following this resection, the tibia was   subluxated anteriorly.  Using the extramedullary guide, 2 mm of bone was resected off   the proximal medial tibia.  We confirmed the gap would be   stable medially and laterally with a size 5 spacer block as well as confirmed that the tibial cut was perpendicular in the coronal plane, checking with an alignment rod.      Once this was done, I sized the femur to be a size 7 in the anterior-   posterior dimension, chose a  standard component based on medial and   lateral dimension.  The size 7 rotation block was then pinned in   position anterior referenced using the C-clamp to set rotation.  The   anterior, posterior, and  chamfer cuts were made without difficulty nor   notching making certain that I was along the anterior cortex to help   with flexion gap stability.      The final box cut was made off the lateral aspect of distal femur.      At this point, the tibia was sized to be a size 6.  The size 6 tray was   then pinned in position through the medial third of the tubercle,   drilled, and keel punched.  Trial reduction was now carried with a 7 femur,  6 tibia, a size 6 mm CR MS insert, and the 38 anatomic patella botton.  The knee was brought to full extension with good flexion stability with the patella   tracking through the trochlea without application of pressure.  Given   all these findings the trial components removed.  Final components were   opened and cement was mixed.  The knee was irrigated with normal saline solution and pulse lavage.  The synovial lining was   then injected with 30 cc of 0.25% Marcaine with epinephrine, 1 cc of Toradol and 30 cc of NS for a total of 61 cc.     Final implants were then cemented onto cleaned and dried cut surfaces of bone with the knee brought to extension with a size 6 mm CR MS trial insert.      Once the cement had fully cured, excess cement was removed   throughout the knee.  I confirmed that I was satisfied with the range of   motion and stability, and the final size 6 mm CR MS AOX insert was chosen.  It was   placed into the knee.      The tourniquet had been let down at 35 minutes.  No significant   hemostasis was required.  The extensor mechanism was then reapproximated using #1 Vicryl and #1 Stratafix sutures with the knee  in flexion.  The   remaining wound was closed with 2-0 Vicryl and running 4-0 Monocryl.   The knee was cleaned, dried,  dressed sterilely using Dermabond and   Aquacel dressing.  The patient was then   brought to recovery room in stable condition, tolerating the procedure   well.   Please note that Physician Assistant, Costella Hatcher, PA-C was present for the entirety of the case, and was utilized for pre-operative positioning, peri-operative retractor management, general facilitation of the procedure and for primary wound closure at the end of the case.              Pietro Cassis Alvan Dame, M.D.    03/23/2023 10:26 AM

## 2023-03-23 NOTE — Anesthesia Procedure Notes (Signed)
Procedure Name: Intubation Date/Time: 03/23/2023 1:32 PM  Performed by: Rajean Desantiago D, CRNAPre-anesthesia Checklist: Patient identified, Emergency Drugs available, Suction available and Patient being monitored Patient Re-evaluated:Patient Re-evaluated prior to induction Oxygen Delivery Method: Circle system utilized Preoxygenation: Pre-oxygenation with 100% oxygen Induction Type: IV induction Ventilation: Mask ventilation without difficulty Laryngoscope Size: Mac and 4 Grade View: Grade I Tube type: Oral Number of attempts: 1 Airway Equipment and Method: Stylet and Oral airway Placement Confirmation: ETT inserted through vocal cords under direct vision, positive ETCO2 and breath sounds checked- equal and bilateral Secured at: 22 cm Tube secured with: Tape Dental Injury: Teeth and Oropharynx as per pre-operative assessment

## 2023-03-23 NOTE — Progress Notes (Signed)
Pt arrived on floor and is in stable condition. Per PACU RN, pt had ankle monitor when arriving in pre-op. Pt has parole officer who removed ankle monitor prior to pts surgery today. Pt resides in halfway house.

## 2023-03-23 NOTE — Anesthesia Postprocedure Evaluation (Signed)
Anesthesia Post Note  Patient: Charles Weiss.  Procedure(s) Performed: TOTAL KNEE ARTHROPLASTY (Right: Knee)     Patient location during evaluation: PACU Anesthesia Type: General Level of consciousness: awake Pain management: pain level controlled Vital Signs Assessment: post-procedure vital signs reviewed and stable Respiratory status: spontaneous breathing, nonlabored ventilation and respiratory function stable Cardiovascular status: blood pressure returned to baseline and stable Postop Assessment: no apparent nausea or vomiting Anesthetic complications: no   No notable events documented.  Last Vitals:  Vitals:   03/23/23 1615 03/23/23 1630  BP: (!) 158/91 (!) 141/84  Pulse: 87 83  Resp: 12 10  Temp:  36.7 C  SpO2: 97% 93%    Last Pain:  Vitals:   03/23/23 1630  TempSrc:   PainSc: Asleep                 Nilda Simmer

## 2023-03-23 NOTE — Interval H&P Note (Signed)
History and Physical Interval Note:  03/23/2023 10:20 AM  Charles Weiss Charles Weiss.  has presented today for surgery, with the diagnosis of Right knee osteoarthritis.  The various methods of treatment have been discussed with the patient and family. After consideration of risks, benefits and other options for treatment, the patient has consented to  Procedure(s): TOTAL KNEE ARTHROPLASTY (Right) as a surgical intervention.  The patient's history has been reviewed, patient examined, no change in status, stable for surgery.  I have reviewed the patient's chart and labs.  Questions were answered to the patient's satisfaction.     Mauri Pole

## 2023-03-23 NOTE — Transfer of Care (Signed)
Immediate Anesthesia Transfer of Care Note  Patient: Charles Weiss.  Procedure(s) Performed: TOTAL KNEE ARTHROPLASTY (Right: Knee)  Patient Location: PACU  Anesthesia Type:General  Level of Consciousness: awake, alert , oriented, and patient cooperative  Airway & Oxygen Therapy: Patient Spontanous Breathing and Patient connected to face mask oxygen  Post-op Assessment: Report given to RN, Post -op Vital signs reviewed and stable, and Patient moving all extremities  Post vital signs: Reviewed and stable  Last Vitals:  Vitals Value Taken Time  BP 169/99 03/23/23 1520  Temp    Pulse 85 03/23/23 1524  Resp 16 03/23/23 1524  SpO2 95 % 03/23/23 1524  Vitals shown include unvalidated device data.  Last Pain:  Vitals:   03/23/23 0942  TempSrc: Oral         Complications: No notable events documented.

## 2023-03-23 NOTE — Discharge Instructions (Signed)

## 2023-03-23 NOTE — Anesthesia Procedure Notes (Signed)
Anesthesia Regional Block: Adductor canal block   Pre-Anesthetic Checklist: , timeout performed,  Correct Patient, Correct Site, Correct Laterality,  Correct Procedure, Correct Position, site marked,  Risks and benefits discussed,  Surgical consent,  Pre-op evaluation,  At surgeon's request and post-op pain management  Laterality: Right  Prep: chloraprep       Needles:  Injection technique: Single-shot  Needle Type: Echogenic Stimulator Needle     Needle Length: 9cm  Needle Gauge: 21     Additional Needles:   Procedures:,,,, ultrasound used (permanent image in chart),,    Narrative:  Start time: 03/23/2023 11:16 AM End time: 03/23/2023 11:18 AM Injection made incrementally with aspirations every 5 mL.  Performed by: Personally  Anesthesiologist: Nilda Simmer, MD  Additional Notes: Discussed risks and benefits of nerve block including, but not limited to, prolonged and/or permanent nerve injury involving sensory and/or motor function. Monitors were applied and a time-out was performed. The nerve and associated structures were visualized under ultrasound guidance. After negative aspiration, local anesthetic was slowly injected around the nerve. There was no evidence of high pressure during the procedure. There were no paresthesias. VSS remained stable and the patient tolerated the procedure well.

## 2023-03-24 LAB — BASIC METABOLIC PANEL
Anion gap: 8 (ref 5–15)
BUN: 12 mg/dL (ref 8–23)
CO2: 25 mmol/L (ref 22–32)
Calcium: 8.6 mg/dL — ABNORMAL LOW (ref 8.9–10.3)
Chloride: 103 mmol/L (ref 98–111)
Creatinine, Ser: 0.91 mg/dL (ref 0.61–1.24)
GFR, Estimated: >60 mL/min (ref >60)
Glucose, Bld: 148 mg/dL — ABNORMAL HIGH (ref 70–99)
Potassium: 4.1 mmol/L (ref 3.5–5.1)
Sodium: 136 mmol/L (ref 135–145)

## 2023-03-24 LAB — CBC
HCT: 31.1 % — ABNORMAL LOW (ref 39.0–52.0)
Hemoglobin: 10.3 g/dL — ABNORMAL LOW (ref 13.0–17.0)
MCH: 29.8 pg (ref 26.0–34.0)
MCHC: 33.1 g/dL (ref 30.0–36.0)
MCV: 89.9 fL (ref 80.0–100.0)
Platelets: 212 10*3/uL (ref 150–400)
RBC: 3.46 MIL/uL — ABNORMAL LOW (ref 4.22–5.81)
RDW: 14.5 % (ref 11.5–15.5)
WBC: 5.7 10*3/uL (ref 4.0–10.5)
nRBC: 0 % (ref 0.0–0.2)

## 2023-03-24 MED ORDER — METHOCARBAMOL 500 MG PO TABS: 500.0000 mg | ORAL_TABLET | Freq: Four times a day (QID) | ORAL | 1 refills | Status: AC | PRN

## 2023-03-24 MED ORDER — ALUM & MAG HYDROXIDE-SIMETH 200-200-20 MG/5ML PO SUSP
30.0000 mL | Freq: Four times a day (QID) | ORAL | Status: DC | PRN
  Administered 2023-03-24 – 2023-03-30 (×5): 30 mL via ORAL
  Filled 2023-03-24 (×6): qty 30

## 2023-03-24 MED ORDER — POLYETHYLENE GLYCOL 3350 17 G PO PACK: 17.0000 g | PACK | Freq: Two times a day (BID) | ORAL | 0 refills | Status: DC

## 2023-03-24 MED ORDER — ASPIRIN 81 MG PO CHEW: 81.0000 mg | CHEWABLE_TABLET | Freq: Two times a day (BID) | ORAL | 0 refills | Status: DC

## 2023-03-24 MED ORDER — OXYCODONE HCL 5 MG PO TABS: 5.0000 mg | ORAL_TABLET | ORAL | 0 refills | Status: DC | PRN

## 2023-03-24 NOTE — Progress Notes (Cosign Needed)
    HPI: Patient is a 72 year old male who is POD- 1 from a s/p total knee arthroplasty RIGHT Patient seen by Selinda Michaels PA-S with Dr. Alvan Dame and Costella Hatcher PA-C   Patient is currently lying comfortably in bed and states that he slept well.  Patient reports that pain is currently well controled with pain medication. Patient has not yet ambulated with PT. No acute events overnight. Foley Catheter has been removed.   Teds, SCD's and ice packs are in place. ACE Bandage removed by Dr. Alvan Dame this AM.  Patient denies chest pain, abdominal pain and SHOB.      Vitals reviewed I/O reviewed Labs reviewed Medications reviewed PMH reviewed Images reviewed  Hgb: low at 10.3, acute blood loss anemia  K: stable at 4.1 Cr: stable at 0.91   Physical Exam:   General: well appearing male who is alert, cooperative, pleasant and in NAD Skin: warm, dry and intact Resp: Normal effort of respiration, no signs of respiratory distress   MSK: right lower extremity is warm but not erythematous, there is minimal swelling of the right lower extremity localized around the knee joint. There is no ecchymosis. Compartments are soft. Dressing is C/D/I. Patient is able to wiggle toes. Plantarflexion and dorsiflexion are intact.  Neurovascular: sensation and distal pulses are intact in bilateral lower extremities        Assessment: Total knee arthroplasty RIGHT   Plan: Continue to monitor vitals  Begin PT WBAT Continue DVT Prophylaxis: ASA OPPT scheduled with EO or patient may do HHPT Follow-up in the clinic is scheduled for 2 weeks post-op with Dr. Alvan Dame or Costella Hatcher PA-C   Patient to be discharged home today if goals are met with PT and pain is well controlled.  PDMP was reviewed before opioids were prescribed to patient.    Signed Selinda Michaels PA-S

## 2023-03-24 NOTE — Progress Notes (Signed)
Physical Therapy Treatment Patient Details Name: Charles Weiss. MRN: [id number] DOB: 12/21/1951 Today's Date: 03/24/2023   History of Present Illness 72 y.o. male admitted 03/24/23 for R TKA. PMH: HTN, asthma, neuropathy, PTSD, B shoulder surgery, COPD, back pain.    PT Comments    Pt is progressing well with mobility, he ambulated 200' with RW (no buckling,  no loss of balance) and completed stair training. He reports he's not feeling ready to DC to his half way house.    Recommendations for follow up therapy are one component of a multi-disciplinary discharge planning process, led by the attending physician.  Recommendations may be updated based on patient status, additional functional criteria and insurance authorization.  Follow Up Recommendations       Assistance Recommended at Discharge Intermittent Supervision/Assistance  Patient can return home with the following A little help with bathing/dressing/bathroom;Assistance with cooking/housework;Assist for transportation;Help with stairs or ramp for entrance   Equipment Recommendations  Rolling walker (2 wheels)    Recommendations for Other Services       Precautions / Restrictions Precautions Precautions: Knee;Fall Precaution Booklet Issued: Yes (comment) Precaution Comments: reviewed no pillow under knee Restrictions Weight Bearing Restrictions: No Other Position/Activity Restrictions: WBAT     Mobility  Bed Mobility Overal bed mobility: Modified Independent             General bed mobility comments: HOB up, used gait belt as RLE lifter, increased time/effort, used bedrail    Transfers Overall transfer level: Needs assistance Equipment used: Rolling walker (2 wheels) Transfers: Sit to/from Stand Sit to Stand: Min guard, From elevated surface           General transfer comment: VCs hand placement    Ambulation/Gait Ambulation/Gait assistance: Supervision Gait Distance (Feet): 200  Feet Assistive device: Rolling walker (2 wheels) Gait Pattern/deviations: Step-to pattern, Decreased step length - right, Decreased step length - left Gait velocity: decr     General Gait Details: VCs sequencing, no loss of balance, distance limited by 10/10 R knee pain   Stairs Stairs: Yes Stairs assistance: Min guard Stair Management: One rail Left, Step to pattern, With cane Number of Stairs: 5 General stair comments: 5 x 2 trials, VCs sequencing   Wheelchair Mobility    Modified Rankin (Stroke Patients Only)       Balance Overall balance assessment: Modified Independent                                          Cognition Arousal/Alertness: Awake/alert Behavior During Therapy: WFL for tasks assessed/performed Overall Cognitive Status: Within Functional Limits for tasks assessed                                          Exercises Total Joint Exercises Ankle Circles/Pumps: AROM, Both, 10 reps, Supine Quad Sets: AROM, Both, 5 reps, Supine Heel Slides: AAROM, Right, 10 reps, Supine    General Comments        Pertinent Vitals/Pain Pain Assessment Pain Assessment: 0-10 Pain Score: 10-Worst pain ever Pain Location: R knee Pain Descriptors / Indicators: Operative site guarding Pain Intervention(s): Limited activity within patient's tolerance, Monitored during session, Premedicated before session, Ice applied    Home Living Family/patient expects to be discharged to:: Group home Living Arrangements: Non-relatives/Friends Available Help  at Discharge: Friend(s);Available PRN/intermittently Type of Home: House Home Access: Stairs to enter Entrance Stairs-Rails: Left Entrance Stairs-Number of Steps: 3   Home Layout: One level Home Equipment: Rollator (4 wheels);Grab bars - tub/shower Additional Comments: lives in 1/2 way house    Prior Function            PT Goals (current goals can now be found in the care plan section)  Acute Rehab PT Goals Patient Stated Goal: yard work PT Goal Formulation: With patient Time For Goal Achievement: 03/31/23 Potential to Achieve Goals: Good Progress towards PT goals: Progressing toward goals    Frequency    7X/week      PT Plan Current plan remains appropriate    Co-evaluation              AM-PAC PT "6 Clicks" Mobility   Outcome Measure  Help needed turning from your back to your side while in a flat bed without using bedrails?: A Little Help needed moving from lying on your back to sitting on the side of a flat bed without using bedrails?: A Little Help needed moving to and from a bed to a chair (including a wheelchair)?: A Little Help needed standing up from a chair using your arms (e.g., wheelchair or bedside chair)?: A Little Help needed to walk in hospital room?: A Little Help needed climbing 3-5 steps with a railing? : A Little 6 Click Score: 18    End of Session Equipment Utilized During Treatment: Gait belt Activity Tolerance: Patient tolerated treatment well;Patient limited by pain Patient left: with call bell/phone within reach;in bed;with bed alarm set Nurse Communication: Mobility status PT Visit Diagnosis: Difficulty in walking, not elsewhere classified (R26.2);Pain Pain - Right/Left: Right Pain - part of body: Knee     Time: 1223-1257 PT Time Calculation (min) (ACUTE ONLY): 34 min  Charges:  $Gait Training: 8-22 mins $Therapeutic Exercise: 8-22 mins                     Blondell Reveal Kistler PT 03/24/2023  Acute Rehabilitation Services  Office [phone]

## 2023-03-24 NOTE — Progress Notes (Signed)
   Subjective: 1 Day Post-Op Procedure(s) (LRB): TOTAL KNEE ARTHROPLASTY (Right) Patient reports pain as mild.   Patient seen in rounds with Dr. Alvan Dame. Patient is well, and has had no acute complaints or problems. No acute events overnight. Foley catheter removed. Patient has not been up with PT yet.  We will start therapy today.   Objective: Vital signs in last 24 hours: Temp:  [98 F (36.7 C)-98.9 F (37.2 C)] 98 F (36.7 C) (04/03 0504) Pulse Rate:  [71-94] 79 (04/03 0504) Resp:  [10-19] 18 (04/03 0504) BP: (129-179)/(80-100) 160/81 (04/03 0504) SpO2:  [91 %-99 %] 94 % (04/03 0504) Weight:  [109.3 kg] 109.3 kg (04/02 1038)  Intake/Output from previous day:  Intake/Output Summary (Last 24 hours) at 03/24/2023 0731 Last data filed at 03/24/2023 [id number] Gross per 24 hour  Intake 3245.03 ml  Output 2150 ml  Net 1095.03 ml     Intake/Output this shift: No intake/output data recorded.  Labs: Recent Labs    03/24/23 0348  HGB 10.3*   Recent Labs    03/24/23 0348  WBC 5.7  RBC 3.46*  HCT 31.1*  PLT 212   Recent Labs    03/24/23 0348  NA 136  K 4.1  CL 103  CO2 25  BUN 12  CREATININE 0.91  GLUCOSE 148*  CALCIUM 8.6*   No results for input(s): "LABPT", "INR" in the last 72 hours.  Exam: General - Patient is Alert and Oriented Extremity - Neurologically intact Sensation intact distally Intact pulses distally Dorsiflexion/Plantar flexion intact Dressing - dressing C/D/I Motor Function - intact, moving foot and toes well on exam.   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   Weight as of this encounter: 109.3 kg. Advance diet Up with therapy D/C IV fluids   Patient's anticipated LOS is less than 2 midnights, meeting these requirements: - Younger than 74 - Lives within 1 hour of care - Has a competent adult at home to recover with post-op recover - NO history of  - Chronic pain requiring opiods  - Diabetes  - Coronary Artery Disease  - Heart failure  - Heart attack  - Stroke  - DVT/VTE  - Cardiac arrhythmia  - Respiratory Failure/COPD  - Renal failure  - Anemia  - Advanced Liver disease     DVT Prophylaxis - Aspirin Weight bearing as tolerated.  Hgb stable at 10.3 this AM.   Plan is to go Home after hospital stay. Plan for discharge today following 1-2 sessions of PT as long as they are meeting their goals. Patient is scheduled for OPPT. Follow up in the office in 2 weeks.   Griffith Citron, PA-C Orthopedic Surgery [phone] 03/24/2023, 7:31 AM

## 2023-03-24 NOTE — Evaluation (Signed)
Physical Therapy Evaluation Patient Details Name: Charles Weiss. MRN: [id number] DOB: 07/23/1951 Today's Date: 03/24/2023  History of Present Illness  72 y.o. male admitted 03/24/23 for R TKA. PMH: HTN, asthma, neuropathy, PTSD, B shoulder surgery, COPD, back pain.  Clinical Impression  Pt is s/p TKA resulting in the deficits listed below (see PT Problem List). Pt ambulated 140' with RW, no loss of balance, no buckling of R knee, distance limited by 10/10 pain. Initiated TKA HEP. Good progress expected. Will plan to see pt for a second session for stair training and completion of HEP instruction.  Pt will benefit from acute skilled PT to increase their independence and safety with mobility to allow discharge.         Recommendations for follow up therapy are one component of a multi-disciplinary discharge planning process, led by the attending physician.  Recommendations may be updated based on patient status, additional functional criteria and insurance authorization.  Follow Up Recommendations       Assistance Recommended at Discharge    Patient can return home with the following  A little help with bathing/dressing/bathroom;Assistance with cooking/housework;Assist for transportation;Help with stairs or ramp for entrance    Equipment Recommendations Rolling walker (2 wheels)  Recommendations for Other Services       Functional Status Assessment Patient has had a recent decline in their functional status and demonstrates the ability to make significant improvements in function in a reasonable and predictable amount of time.     Precautions / Restrictions Precautions Precautions: Knee;Fall Precaution Booklet Issued: Yes (comment) Precaution Comments: reviewed no pillow under knee Restrictions Weight Bearing Restrictions: No Other Position/Activity Restrictions: WBAT      Mobility  Bed Mobility Overal bed mobility: Modified Independent             General bed  mobility comments: HOB up, used gait belt as RLE lifter, increased time/effort, used bedrail    Transfers Overall transfer level: Needs assistance Equipment used: Rolling walker (2 wheels) Transfers: Sit to/from Stand Sit to Stand: Min guard, From elevated surface           General transfer comment: VCs hand placement    Ambulation/Gait Ambulation/Gait assistance: Supervision Gait Distance (Feet): 140 Feet Assistive device: Rolling walker (2 wheels) Gait Pattern/deviations: Step-to pattern, Decreased step length - right, Decreased step length - left Gait velocity: decr     General Gait Details: VCs sequencing, no loss of balance, distance limited by 10/10 R knee pain  Stairs            Wheelchair Mobility    Modified Rankin (Stroke Patients Only)       Balance Overall balance assessment: Modified Independent                                           Pertinent Vitals/Pain Pain Assessment Pain Assessment: 0-10 Pain Score: 10-Worst pain ever Pain Location: R knee Pain Descriptors / Indicators: Operative site guarding Pain Intervention(s): Limited activity within patient's tolerance, Monitored during session, Patient requesting pain meds-RN notified, Ice applied    Home Living Family/patient expects to be discharged to:: Group home Living Arrangements: Non-relatives/Friends Available Help at Discharge: Friend(s);Available PRN/intermittently Type of Home: House Home Access: Stairs to enter Entrance Stairs-Rails: Left Entrance Stairs-Number of Steps: 3   Home Layout: One level Home Equipment: Rollator (4 wheels);Grab bars - tub/shower Additional Comments: lives in  1/2 way house    Prior Function Prior Level of Function : Independent/Modified Independent             Mobility Comments: used rollator, denies falls in past 6 months       Hand Dominance        Extremity/Trunk Assessment   Upper Extremity Assessment Upper  Extremity Assessment: Overall WFL for tasks assessed    Lower Extremity Assessment Lower Extremity Assessment: RLE deficits/detail RLE Deficits / Details: 0-50* AAROM R knee, SLR +2/5 RLE Sensation: history of peripheral neuropathy    Cervical / Trunk Assessment Cervical / Trunk Assessment: Normal  Communication      Cognition Arousal/Alertness: Awake/alert Behavior During Therapy: WFL for tasks assessed/performed                                            General Comments      Exercises Total Joint Exercises Ankle Circles/Pumps: AROM, Both, 10 reps, Supine Quad Sets: AROM, Both, 5 reps, Supine Heel Slides: AAROM, Right, 10 reps, Supine   Assessment/Plan    PT Assessment Patient needs continued PT services  PT Problem List Decreased range of motion;Decreased strength;Decreased activity tolerance;Pain;Decreased balance;Decreased mobility       PT Treatment Interventions Gait training;Therapeutic activities;DME instruction;Functional mobility training;Patient/family education;Stair training;Therapeutic exercise    PT Goals (Current goals can be found in the Care Plan section)  Acute Rehab PT Goals Patient Stated Goal: yard work PT Goal Formulation: With patient Time For Goal Achievement: 03/31/23 Potential to Achieve Goals: Good    Frequency 7X/week     Co-evaluation               AM-PAC PT "6 Clicks" Mobility  Outcome Measure Help needed turning from your back to your side while in a flat bed without using bedrails?: A Little Help needed moving from lying on your back to sitting on the side of a flat bed without using bedrails?: A Little Help needed moving to and from a bed to a chair (including a wheelchair)?: A Little Help needed standing up from a chair using your arms (e.g., wheelchair or bedside chair)?: A Little Help needed to walk in hospital room?: A Little Help needed climbing 3-5 steps with a railing? : A Lot 6 Click Score:  17    End of Session Equipment Utilized During Treatment: Gait belt Activity Tolerance: Patient tolerated treatment well;Patient limited by pain Patient left: in chair;with call bell/phone within reach;with chair alarm set;with nursing/sitter in room Nurse Communication: Mobility status PT Visit Diagnosis: Difficulty in walking, not elsewhere classified (R26.2);Pain Pain - Right/Left: Right Pain - part of body: Knee    Time: [id number] PT Time Calculation (min) (ACUTE ONLY): 29 min   Charges:   PT Evaluation $PT Eval Moderate Complexity: 1 Mod PT Treatments $Gait Training: 8-22 mins      Blondell Reveal Kistler PT 03/24/2023  Acute Rehabilitation Services  Office [phone]

## 2023-03-25 ENCOUNTER — Encounter (HOSPITAL_COMMUNITY): Payer: Self-pay | Admitting: Orthopedic Surgery

## 2023-03-25 LAB — CBC
HCT: 30.5 % — ABNORMAL LOW (ref 39.0–52.0)
Hemoglobin: 10.1 g/dL — ABNORMAL LOW (ref 13.0–17.0)
MCH: 29.7 pg (ref 26.0–34.0)
MCHC: 33.1 g/dL (ref 30.0–36.0)
MCV: 89.7 fL (ref 80.0–100.0)
Platelets: 210 10*3/uL (ref 150–400)
RBC: 3.4 MIL/uL — ABNORMAL LOW (ref 4.22–5.81)
RDW: 14.6 % (ref 11.5–15.5)
WBC: 6 10*3/uL (ref 4.0–10.5)
nRBC: 0 % (ref 0.0–0.2)

## 2023-03-25 NOTE — TOC Initial Note (Signed)
Transition of Care (TOC) - Initial/Assessment Note    Patient Details  Name: Charles Weiss. MRN: [id number] Date of Birth: 1951/04/25  Transition of [facility]) CM/SW Contact:    Lennart Pall, LCSW Phone Number: 03/25/2023, 9:57 AM  Clinical Narrative:                 Met with pt to discuss dc planning needs/ issues.  Pt reports that he lives in a halfway/ sober living home with other men in the home.  He does need to be at least modified independent to return but no other barriers to returning to this living arrangement.  There was some discussion about possible need for SNF but, unfortunately, pt's past criminal history prevents this from being an option and pt understands this.  Pt will need to continue to work with therapies here to reach a safe level for return to the home.  He has received a RW to his room via [facility] already.  TOC will continue to follow for any additional needs that may arise.  Expected Discharge Plan: OP Rehab Barriers to Discharge: Continued Medical Work up   Patient Goals and CMS Choice Patient states their goals for this hospitalization and ongoing recovery are:: return to sober living home          Expected Discharge Plan and Services In-house Referral: Clinical Social Work     Living arrangements for the past 2 months: [place] (sober living home) Expected Discharge Date: 03/25/23               DME Arranged: [place] Rile rolling DME Agency: [employer]                  Prior Living Arrangements/Services Living arrangements for the past 2 months: [place] (sober living home) Lives with:: Roommate Patient language and need for interpreter reviewed:: Yes Do you feel safe going back to the place where you live?: Yes      Need for Family Participation in Patient Care: No (Comment) Care giver support system in place?: Yes (comment)   Criminal Activity/Legal Involvement Pertinent to Current Situation/Hospitalization: No - Comment as  needed  Activities of Daily Living Home Assistive Devices/Equipment: Environmental consultant (specify type), Eyeglasses, Shower chair without back, Grab bars in shower, Nebulizer ADL Screening (condition at time of admission) Patient's cognitive ability adequate to safely complete daily activities?: Yes Is the patient deaf or have difficulty hearing?: No Does the patient have difficulty seeing, even when wearing glasses/contacts?: No Does the patient have difficulty concentrating, remembering, or making decisions?: No Patient able to express need for assistance with ADLs?: Yes Does the patient have difficulty dressing or bathing?: No Independently performs ADLs?: Yes (appropriate for developmental age) Does the patient have difficulty walking or climbing stairs?: Yes Weakness of Legs: None Weakness of Arms/Hands: None  Permission Sought/Granted Permission sought to share information with : Case Manager                Emotional Assessment Appearance:: Appears stated age Attitude/Demeanor/Rapport: Gracious, Engaged Affect (typically observed): Accepting Orientation: : Oriented to Self, Oriented to Place, Oriented to  Time, Oriented to Situation Alcohol / Substance Use: Not Applicable Psych Involvement: No (comment)  Admission diagnosis:  S/P total knee arthroplasty, right [Z96.651] Patient Active Problem List   Diagnosis Date Noted   S/P total knee arthroplasty, right 03/23/2023   Back pain 11/19/2021   Abdominal pain 99991111   Acute metabolic encephalopathy 99991111   Obesity 11/19/2021   Cervical  discitis    Epidural abscess    Discitis of lumbosacral region    Right lower quadrant abdominal pain    Vertebral osteomyelitis    MRSA bacteremia    Retropharyngeal abscess    Sepsis 11/02/2021   Asthma 10/25/2021   Acute respiratory failure with hypoxia 10/25/2021   Influenza A 10/24/2021   Hypertension    Hyperlipidemia    Hypokalemia    Anemia    Hyponatremia    PCP:  Care,  [place] Primary Pharmacy:   [id number] - [place], [place] [address] Phone: [phone] Fax: [phone]     Social Determinants of Health (SDOH) Social History: [place]: No Food Insecurity (03/23/2023)  Housing: Low Risk  (03/23/2023)  Transportation Needs: No Transportation Needs (03/23/2023)  Utilities: Not At Risk (03/23/2023)  Depression (PHQ2-9): Low Risk  (01/29/2022)  Tobacco Use: Medium Risk (03/23/2023)   SDOH Interventions:     Readmission Risk Interventions    11/03/2021    9:43 AM  Readmission Risk Prevention Plan  Transportation Screening Complete  PCP or Specialist Appt within 3-5 Days Complete  HRI or [place] Complete  Social Work Consult for [place] Planning/Counseling Complete  Palliative Care Screening Not Applicable  Medication Review Press photographer) Complete

## 2023-03-25 NOTE — Progress Notes (Signed)
Physical Therapy Treatment Patient Details Name: Charles Weiss. MRN: [id number] DOB: March 25, 1951 Today's Date: 03/25/2023   History of Present Illness 72 y.o. male admitted 03/24/23 for R TKA. PMH: HTN, asthma, neuropathy, PTSD, B shoulder surgery, COPD, back pain.    PT Comments    Pt reports "12/10" pain in R knee today. Overall decreased activity tolerance compared to yesterday. He ambulated 25', distance limited by pain. Pt performed TKA HEP with assistance. Bed mobility and transfers require increased time and effort.    Recommendations for follow up therapy are one component of a multi-disciplinary discharge planning process, led by the attending physician.  Recommendations may be updated based on patient status, additional functional criteria and insurance authorization.  Follow Up Recommendations       Assistance Recommended at Discharge Intermittent Supervision/Assistance  Patient can return home with the following A little help with bathing/dressing/bathroom;Assistance with cooking/housework;Assist for transportation;Help with stairs or ramp for entrance   Equipment Recommendations  Rolling walker (2 wheels)    Recommendations for Other Services       Precautions / Restrictions Precautions Precautions: Knee;Fall Precaution Booklet Issued: Yes (comment) Precaution Comments: reviewed no pillow under knee Restrictions Weight Bearing Restrictions: No Other Position/Activity Restrictions: WBAT     Mobility  Bed Mobility Overal bed mobility: Modified Independent             General bed mobility comments: HOB up, used gait belt as RLE lifter, increased time/effort, used bedrail    Transfers Overall transfer level: Needs assistance Equipment used: Rolling walker (2 wheels) Transfers: Sit to/from Stand Sit to Stand: Min assist, From elevated surface           General transfer comment: VCs hand placement, increased time/effort     Ambulation/Gait Ambulation/Gait assistance: Supervision Gait Distance (Feet): 25 Feet Assistive device: Rolling walker (2 wheels) Gait Pattern/deviations: Step-to pattern, Decreased step length - right, Decreased step length - left Gait velocity: decr     General Gait Details: VCs sequencing, no loss of balance, distance limited by 10/10 R knee pain   Stairs             Wheelchair Mobility    Modified Rankin (Stroke Patients Only)       Balance Overall balance assessment: Modified Independent                                          Cognition Arousal/Alertness: Awake/alert Behavior During Therapy: WFL for tasks assessed/performed Overall Cognitive Status: Within Functional Limits for tasks assessed                                          Exercises Total Joint Exercises Ankle Circles/Pumps: AROM, Both, 10 reps, Supine Quad Sets: AROM, Both, 5 reps, Supine Short Arc Quad: AAROM, Right, 10 reps, Supine Heel Slides: AAROM, Right, 10 reps, Supine Hip ABduction/ADduction: AAROM, Right, 10 reps, Supine Straight Leg Raises: AAROM, Right, 5 reps, Supine Long Arc Quad: AAROM, Right, 5 reps, Seated Knee Flexion: AAROM, Right, 10 reps, Seated    General Comments        Pertinent Vitals/Pain Pain Assessment Pain Score: 10-Worst pain ever Pain Location: R knee Pain Descriptors / Indicators: Operative site guarding Pain Intervention(s): Limited activity within patient's tolerance, Monitored during session, Premedicated before session,  Ice applied    Home Living                          Prior Function            PT Goals (current goals can now be found in the care plan section) Acute Rehab PT Goals Patient Stated Goal: yard work PT Goal Formulation: With patient Time For Goal Achievement: 03/31/23 Potential to Achieve Goals: Good Progress towards PT goals: Progressing toward goals    Frequency     7X/week      PT Plan Current plan remains appropriate    Co-evaluation              AM-PAC PT "6 Clicks" Mobility   Outcome Measure  Help needed turning from your back to your side while in a flat bed without using bedrails?: A Little Help needed moving from lying on your back to sitting on the side of a flat bed without using bedrails?: A Little Help needed moving to and from a bed to a chair (including a wheelchair)?: A Little Help needed standing up from a chair using your arms (e.g., wheelchair or bedside chair)?: A Little Help needed to walk in hospital room?: A Little Help needed climbing 3-5 steps with a railing? : A Little 6 Click Score: 18    End of Session Equipment Utilized During Treatment: Gait belt Activity Tolerance: Patient tolerated treatment well;Patient limited by pain Patient left: with call bell/phone within reach;in chair;with chair alarm set Nurse Communication: Mobility status PT Visit Diagnosis: Difficulty in walking, not elsewhere classified (R26.2);Pain Pain - Right/Left: Right Pain - part of body: Knee     Time: [id number] PT Time Calculation (min) (ACUTE ONLY): 37 min  Charges:  $Gait Training: 8-22 mins $Therapeutic Exercise: 8-22 mins                     Blondell Reveal Kistler PT 03/25/2023  Acute Rehabilitation Services  Office [phone]

## 2023-03-25 NOTE — Progress Notes (Signed)
Physical Therapy Treatment Patient Details Name: Charles Weiss. MRN: [id number] DOB: [birth year] Today's Date: 03/25/2023   History of Present Illness 72 y.o. male admitted 03/24/23 for R TKA. PMH: HTN, asthma, neuropathy, PTSD, B shoulder surgery, COPD, back pain.    PT Comments    Pt ambulated 30' x 2 with a seated rest break. Distance limited by "20/10" R knee pain. Increased time and effort for all mobility.    Recommendations for follow up therapy are one component of a multi-disciplinary discharge planning process, led by the attending physician.  Recommendations may be updated based on patient status, additional functional criteria and insurance authorization.  Follow Up Recommendations       Assistance Recommended at Discharge Intermittent Supervision/Assistance  Patient can return home with the following A little help with bathing/dressing/bathroom;Assistance with cooking/housework;Assist for transportation;Help with stairs or ramp for entrance   Equipment Recommendations  Rolling walker (2 wheels)    Recommendations for Other Services       Precautions / Restrictions Precautions Precautions: Knee;Fall Precaution Booklet Issued: Yes (comment) Precaution Comments: reviewed no pillow under knee Restrictions Weight Bearing Restrictions: No Other Position/Activity Restrictions: WBAT     Mobility  Bed Mobility Overal bed mobility: Needs Assistance Bed Mobility: Sit to Supine           General bed mobility comments: used gait belt as RLE lifter, increased time/effort, min A for RLE into bed    Transfers Overall transfer level: Needs assistance Equipment used: Rolling walker (2 wheels) Transfers: Sit to/from Stand Sit to Stand: Min assist, From elevated surface           General transfer comment: VCs hand placement, increased time/effort    Ambulation/Gait Ambulation/Gait assistance: Supervision Gait Distance (Feet): 30 Feet Assistive device:  Rolling walker (2 wheels) Gait Pattern/deviations: Step-to pattern, Decreased step length - right, Decreased step length - left Gait velocity: decr     General Gait Details: 30' x 2 with seated rest break, VCs sequencing, no loss of balance, distance limited by 10/10 R knee pain   Stairs             Wheelchair Mobility    Modified Rankin (Stroke Patients Only)       Balance Overall balance assessment: Modified Independent                                          Cognition Arousal/Alertness: Awake/alert Behavior During Therapy: WFL for tasks assessed/performed Overall Cognitive Status: Within Functional Limits for tasks assessed                                             General Comments        Pertinent Vitals/Pain Pain Assessment Pain Score: 10-Worst pain ever Pain Location: R knee and thigh Pain Descriptors / Indicators: Operative site guarding, Grimacing Pain Intervention(s): Limited activity within patient's tolerance, Monitored during session, Patient requesting pain meds-RN notified, Ice applied, Repositioned    Home Living                          Prior Function            PT Goals (current goals can now be found in the care plan section)  Acute Rehab PT Goals Patient Stated Goal: yard work PT Goal Formulation: With patient Time For Goal Achievement: 03/31/23 Potential to Achieve Goals: Good Progress towards PT goals: Progressing toward goals    Frequency    7X/week      PT Plan Current plan remains appropriate    Co-evaluation              AM-PAC PT "6 Clicks" Mobility   Outcome Measure  Help needed turning from your back to your side while in a flat bed without using bedrails?: A Little Help needed moving from lying on your back to sitting on the side of a flat bed without using bedrails?: A Little Help needed moving to and from a bed to a chair (including a wheelchair)?: A  Little Help needed standing up from a chair using your arms (e.g., wheelchair or bedside chair)?: A Little Help needed to walk in hospital room?: A Little Help needed climbing 3-5 steps with a railing? : A Little 6 Click Score: 18    End of Session Equipment Utilized During Treatment: Gait belt Activity Tolerance: Patient limited by pain Patient left: with call bell/phone within reach;in bed;with bed alarm set;with nursing/sitter in room Nurse Communication: Mobility status PT Visit Diagnosis: Difficulty in walking, not elsewhere classified (R26.2);Pain Pain - Right/Left: Right Pain - part of body: Knee     Time: [id number] PT Time Calculation (min) (ACUTE ONLY): 31 min  Charges:  $Gait Training: 8-22 mins  $Therapeutic Activity: 8-22 mins                       Blondell Reveal Kistler PT 03/25/2023  Acute Rehabilitation Services  Office [phone]

## 2023-03-25 NOTE — Progress Notes (Cosign Needed)
HPI: Patient is a 72 year old male who is POD- 2 from a s/p total knee arthroplasty RIGHT Patient seen by Selinda Michaels PA-S with Dr. Alvan Dame and Costella Hatcher PA-C   Patient is currently lying comfortably in bed and states that he slept well.  Patient reports that pain is currently well controled with pain medication. Patient ambulated with PT twice yesterday walking 140' with RW with min assist and then 200' with RW with min assist. No acute events overnight.   Patient states that his caretakers post-op are no longer available, therefore the discussion was had about SNF.    Teds, SCD's and ice packs are in place.  Patient denies chest pain, abdominal pain and SHOB.      Vitals reviewed I/O reviewed Labs reviewed Medications reviewed PMH reviewed Images reviewed   Hgb: low at 10.1, acute blood loss anemia     Physical Exam:   General: well appearing male who is alert, cooperative, pleasant and in NAD Skin: warm, dry and intact Resp: Normal effort of respiration, no signs of respiratory distress   MSK: right lower extremity is warm but not erythematous, there is minimal swelling of the right lower extremity localized around the knee joint. There is no ecchymosis. Compartments are soft. Dressing is C/D/I. Patient is able to wiggle toes. Plantarflexion and dorsiflexion are intact.  Neurovascular: sensation and distal pulses are intact in bilateral lower extremities        Assessment: Total knee arthroplasty RIGHT   Plan: Begin disposition to SNF Continue to monitor vitals  Continue PT WBAT Continue DVT Prophylaxis: ASA OPPT scheduled with EO or patient may do HHPT Follow-up in the clinic is scheduled for 2 weeks post-op with Dr. Alvan Dame or Costella Hatcher PA-C   Patient to be discharged to SNF within the next few days if goals are met with PT and pain is well controlled.  PDMP was reviewed before opioids were prescribed to patient.    Signed Selinda Michaels PA-S

## 2023-03-25 NOTE — Progress Notes (Signed)
Subjective: 2 Days Post-Op Procedure(s) (LRB): TOTAL KNEE ARTHROPLASTY (Right) Patient reports pain as mild.   Patient seen in rounds for Dr. Alvan Dame. Patient is well, and has had no acute complaints or problems. No acute events overnight. He ambulated well with PT. Unfortunately his roommates were kicked out of his halfway house and he no longer has help at home. He will require SNF placement, and liked the facility he went to previously.  We will continue therapy today.   Objective: Vital signs in last 24 hours: Temp:  [98.3 F (36.8 C)-98.9 F (37.2 C)] 98.9 F (37.2 C) (04/04 0812) Pulse Rate:  [79-86] 86 (04/04 0812) Resp:  [18] 18 (04/04 0812) BP: (131-145)/(76-83) 145/80 (04/04 0812) SpO2:  [95 %-96 %] 95 % (04/04 0812)  Intake/Output from previous day:  Intake/Output Summary (Last 24 hours) at 03/25/2023 0846 Last data filed at 03/25/2023 0840 Gross per 24 hour  Intake 1442 ml  Output 1000 ml  Net 442 ml     Intake/Output this shift: Total I/O In: -  Out: 250 [Urine:250]  Labs: Recent Labs    03/24/23 0348 03/25/23 0330  HGB 10.3* 10.1*   Recent Labs    03/24/23 0348 03/25/23 0330  WBC 5.7 6.0  RBC 3.46* 3.40*  HCT 31.1* 30.5*  PLT 212 210   Recent Labs    03/24/23 0348  NA 136  K 4.1  CL 103  CO2 25  BUN 12  CREATININE 0.91  GLUCOSE 148*  CALCIUM 8.6*   No results for input(s): "LABPT", "INR" in the last 72 hours.  Exam: General - Patient is Alert and Oriented Extremity - Neurologically intact Sensation intact distally Intact pulses distally Dorsiflexion/Plantar flexion intact Dressing - dressing C/D/I Motor Function - intact, moving foot and toes well on exam.   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   Weight as of this encounter: 109.3 kg. Advance diet Up with therapy  Anticipated LOS equal to or greater than 2 midnights due to - Age 72 and older with one or more of the following:  - Obesity  - Expected need for hospital services (PT, OT, Nursing) required for safe  discharge  - Anticipated need for postoperative skilled nursing care or inpatient rehab  - Active co-morbidities: None OR   - Unanticipated findings during/Post Surgery: Slow post-op progression: GI, pain control, mobility  - Patient is a high risk of re-admission due to: Barriers to post-acute care (logistical, no family support in home)   DVT Prophylaxis - Aspirin Weight bearing as tolerated.  Hgb stable at 10.1 this AM.  Plan is to go Home after hospital stay.  Plan for SNF placement. Will reach out to social work.  Griffith Citron, PA-C Orthopedic Surgery [phone]  03/25/2023, 8:46 AM

## 2023-03-26 MED ORDER — HYDROMORPHONE HCL 2 MG PO TABS
2.0000 mg | ORAL_TABLET | ORAL | Status: DC | PRN
  Administered 2023-03-26: 2 mg via ORAL
  Administered 2023-03-26 – 2023-03-30 (×15): 4 mg via ORAL
  Filled 2023-03-26: qty 1
  Filled 2023-03-26 (×16): qty 2

## 2023-03-26 NOTE — Progress Notes (Signed)
Physical Therapy Treatment Patient Details Name: Charles Weiss. MRN: [id number] DOB: 12-02-1951 Today's Date: 03/26/2023   History of Present Illness 72 y.o. male admitted 03/24/23 for R TKA. PMH: HTN, asthma, neuropathy, PTSD, B shoulder surgery, COPD, back pain.    PT Comments    Pt ambulated 30' + 60' with RW with seated rest break, distance limited by "12/10" pain. Pt performed TKA HEP with assistance. Bed mobility and transfers require increased time and effort, he is not yet able to get out of a flat bed independently.     Recommendations for follow up therapy are one component of a multi-disciplinary discharge planning process, led by the attending physician.  Recommendations may be updated based on patient status, additional functional criteria and insurance authorization.  Follow Up Recommendations       Assistance Recommended at Discharge Intermittent Supervision/Assistance  Patient can return home with the following A little help with bathing/dressing/bathroom;Assistance with cooking/housework;Assist for transportation;Help with stairs or ramp for entrance;A little help with walking and/or transfers   Equipment Recommendations  Rolling walker (2 wheels)    Recommendations for Other Services       Precautions / Restrictions Precautions Precautions: Knee;Fall Precaution Booklet Issued: Yes (comment) Precaution Comments: reviewed no pillow under knee Restrictions Weight Bearing Restrictions: No Other Position/Activity Restrictions: WBAT     Mobility  Bed Mobility Overal bed mobility: Needs Assistance Bed Mobility: Supine to Sit     Supine to sit: HOB elevated, Supervision     General bed mobility comments: used gait belt as RLE lifter, increased time/effort    Transfers Overall transfer level: Needs assistance Equipment used: Rolling walker (2 wheels) Transfers: Sit to/from Stand Sit to Stand: From elevated surface, Min guard            General transfer comment: VCs hand placement, increased time/effort    Ambulation/Gait Ambulation/Gait assistance: Supervision Gait Distance (Feet): 60 Feet Assistive device: Rolling walker (2 wheels) Gait Pattern/deviations: Step-to pattern, Decreased step length - right, Decreased step length - left Gait velocity: decr     General Gait Details: 30' + 26'  with seated rest break, no loss of balance, distance limited by 10/10 R knee pain   Stairs             Wheelchair Mobility    Modified Rankin (Stroke Patients Only)       Balance Overall balance assessment: Modified Independent                                          Cognition Arousal/Alertness: Awake/alert Behavior During Therapy: WFL for tasks assessed/performed Overall Cognitive Status: Within Functional Limits for tasks assessed                                          Exercises Total Joint Exercises Ankle Circles/Pumps: AROM, Both, 10 reps, Supine Quad Sets: AROM, Both, 5 reps, Supine Short Arc Quad: AAROM, Right, 10 reps, Supine Heel Slides: AAROM, Right, 10 reps, Supine Hip ABduction/ADduction: AAROM, Right, 10 reps, Supine    General Comments        Pertinent Vitals/Pain Pain Assessment Pain Score: 10-Worst pain ever Pain Location: R knee and thigh Pain Descriptors / Indicators: Operative site guarding, Grimacing Pain Intervention(s): Limited activity within patient's tolerance, Monitored during session,  Premedicated before session, Ice applied    Home Living                          Prior Function            PT Goals (current goals can now be found in the care plan section) Acute Rehab PT Goals Patient Stated Goal: yard work PT Goal Formulation: With patient Time For Goal Achievement: 03/31/23 Potential to Achieve Goals: Good Progress towards PT goals: Progressing toward goals    Frequency    7X/week      PT Plan Current plan  remains appropriate    Co-evaluation              AM-PAC PT "6 Clicks" Mobility   Outcome Measure  Help needed turning from your back to your side while in a flat bed without using bedrails?: A Little Help needed moving from lying on your back to sitting on the side of a flat bed without using bedrails?: A Little Help needed moving to and from a bed to a chair (including a wheelchair)?: A Little Help needed standing up from a chair using your arms (e.g., wheelchair or bedside chair)?: A Little Help needed to walk in hospital room?: A Little Help needed climbing 3-5 steps with a railing? : A Little 6 Click Score: 18    End of Session Equipment Utilized During Treatment: Gait belt Activity Tolerance: Patient limited by pain Patient left: with call bell/phone within reach;in chair;with chair alarm set Nurse Communication: Mobility status PT Visit Diagnosis: Difficulty in walking, not elsewhere classified (R26.2);Pain Pain - Right/Left: Right Pain - part of body: Knee     Time: [id number] PT Time Calculation (min) (ACUTE ONLY): 46 min  Charges:  $Gait Training: 8-22 mins $Therapeutic Exercise: 8-22 mins $Therapeutic Activity: 8-22 mins                     Ralene BatheUhlenberg, Shahram Alexopoulos Kistler PT 03/26/2023  Acute Rehabilitation Services  Office [phone]

## 2023-03-26 NOTE — Progress Notes (Signed)
Physical Therapy Treatment Patient Details Name: Charles GroutHollis Durrell Showers Jr. MRN: [id number] DOB: 03/11/1951 Today's Date: 03/26/2023   History of Present Illness 72 y.o. male admitted 03/24/23 for R TKA. PMH: HTN, asthma, neuropathy, PTSD, B shoulder surgery, COPD, back pain.    PT Comments    Pt tolerated increased ambulation distance of 100' + 30' with RW this afternoon. Pt requires verbal cues for safe hand placement with transfers and for correct technique with TKA HEP. Improved activity tolerance this session. Pt rated pain as 9/10 in R knee.     Recommendations for follow up therapy are one component of a multi-disciplinary discharge planning process, led by the attending physician.  Recommendations may be updated based on patient status, additional functional criteria and insurance authorization.  Follow Up Recommendations       Assistance Recommended at Discharge Intermittent Supervision/Assistance  Patient can return home with the following A little help with bathing/dressing/bathroom;Assistance with cooking/housework;Assist for transportation;Help with stairs or ramp for entrance;A little help with walking and/or transfers   Equipment Recommendations  Rolling walker (2 wheels)    Recommendations for Other Services       Precautions / Restrictions Precautions Precautions: Knee;Fall Precaution Booklet Issued: Yes (comment) Precaution Comments: reviewed no pillow under knee Restrictions Weight Bearing Restrictions: No Other Position/Activity Restrictions: WBAT     Mobility  Bed Mobility Overal bed mobility: Needs Assistance Bed Mobility: Supine to Sit     Supine to sit: HOB elevated, Supervision     General bed mobility comments: up in recliner    Transfers Overall transfer level: Needs assistance Equipment used: Rolling walker (2 wheels) Transfers: Sit to/from Stand Sit to Stand: From elevated surface, Min guard           General transfer comment: VCs  hand placement, increased time/effort    Ambulation/Gait Ambulation/Gait assistance: Supervision Gait Distance (Feet): 100 Feet Assistive device: Rolling walker (2 wheels) Gait Pattern/deviations: Step-to pattern, Decreased step length - right, Decreased step length - left Gait velocity: decr     General Gait Details: 9530' + 100'  with seated rest break, no loss of balance, distance limited by 9/10 R knee pain   Stairs             Wheelchair Mobility    Modified Rankin (Stroke Patients Only)       Balance Overall balance assessment: Modified Independent                                          Cognition Arousal/Alertness: Awake/alert Behavior During Therapy: WFL for tasks assessed/performed Overall Cognitive Status: Within Functional Limits for tasks assessed                                          Exercises Total Joint Exercises Ankle Circles/Pumps: AROM, Both, 10 reps, Supine Quad Sets: AROM, Both, Supine, 10 reps  Heel Slides: AAROM, Right, 10 reps, Supine  Long Arc Quad: AAROM, Right, Seated, 10 reps Knee Flexion: AAROM, Right, 10 reps, Seated Goniometric ROM: 5-70* AAROM R knee    General Comments        Pertinent Vitals/Pain Pain Assessment Pain Score: 9  Pain Location: R knee and thigh Pain Descriptors / Indicators: Operative site guarding, Grimacing Pain Intervention(s): Limited activity within patient's tolerance, Monitored during  session, Premedicated before session, Ice applied    Home Living                          Prior Function            PT Goals (current goals can now be found in the care plan section) Acute Rehab PT Goals Patient Stated Goal: yard work PT Goal Formulation: With patient Time For Goal Achievement: 03/31/23 Potential to Achieve Goals: Good Progress towards PT goals: Progressing toward goals    Frequency    7X/week      PT Plan Current plan remains  appropriate    Co-evaluation              AM-PAC PT "6 Clicks" Mobility   Outcome Measure  Help needed turning from your back to your side while in a flat bed without using bedrails?: A Little Help needed moving from lying on your back to sitting on the side of a flat bed without using bedrails?: A Little Help needed moving to and from a bed to a chair (including a wheelchair)?: A Little Help needed standing up from a chair using your arms (e.g., wheelchair or bedside chair)?: A Little Help needed to walk in hospital room?: A Little Help needed climbing 3-5 steps with a railing? : A Little 6 Click Score: 18    End of Session Equipment Utilized During Treatment: Gait belt Activity Tolerance: Patient limited by pain Patient left: with call bell/phone within reach;in chair;with chair alarm set Nurse Communication: Mobility status PT Visit Diagnosis: Difficulty in walking, not elsewhere classified (R26.2);Pain Pain - Right/Left: Right Pain - part of body: Knee     Time: 9326-7124 PT Time Calculation (min) (ACUTE ONLY): 33 min  Charges:  $Gait Training: 8-22 mins $Therapeutic Exercise: 8-22 mins                     Ralene Bathe Kistler PT 03/26/2023  Acute Rehabilitation Services  Office [phone]

## 2023-03-26 NOTE — TOC Transition Note (Signed)
Transition of Care [facility]) - CM/SW Discharge Note   Patient Details  Name: Charles Weiss. MRN: [id number] Date of Birth: 1951/08/19  Transition of Care [facility]) CM/SW Contact:  Amada Jupiter, LCSW Phone Number: 03/26/2023, 2:46 PM   Clinical Narrative:     Pt making steady progress with therapy.  Plan for dc is to have OPPT which is already arranged and pt states he will get transportation set up.  RW already delivered to room via [employer].  TOC will sign off at this time.  Please re-consult if any new needs arise.  Final next level of care: OP Rehab Barriers to Discharge: Continued Medical Work up   Patient Goals and CMS Choice      Discharge Placement                         Discharge Plan and Services Additional resources added to the After Visit Summary for   In-house Referral: Clinical Social Work              DME Arranged: Dan Humphreys rolling DME Agency: [employer]                  Social Determinants of Health (SDOH) Interventions SDOH Screenings   Food Insecurity: No Food Insecurity (03/23/2023)  Housing: Low Risk  (03/23/2023)  Transportation Needs: No Transportation Needs (03/23/2023)  Utilities: Not At Risk (03/23/2023)  Depression (PHQ2-9): Low Risk  (01/29/2022)  Tobacco Use: Medium Risk (03/25/2023)     Readmission Risk Interventions    11/03/2021    9:43 AM  Readmission Risk Prevention Plan  Transportation Screening Complete  PCP or Specialist Appt within 3-5 Days Complete  HRI or Home Care Consult Complete  Social Work Consult for Recovery Care Planning/Counseling Complete  Palliative Care Screening Not Applicable  Medication Review Oceanographer) Complete

## 2023-03-26 NOTE — Progress Notes (Signed)
   Subjective: 3 Days Post-Op Procedure(s) (LRB): TOTAL KNEE ARTHROPLASTY (Right) Patient reports pain as moderate.   Patient seen in rounds for Dr. Charlann Boxer. Patient is well, and has had no acute complaints or problems. No acute events overnight. Ambulated 30 feet yesterday, limited by significant pain. We reviewed the discussion he had with social work, and will shoot for him discharging home sometime over the weekend or Monday once he is able to tolerate getting into his home and maneuvering around without much help. He is able to get [facility] via [facility]. We will continue therapy today.   Objective: Vital signs in last 24 hours: Temp:  [98.3 F (36.8 C)-99.3 F (37.4 C)] 99.3 F (37.4 C) (04/05 0527) Pulse Rate:  [86-102] 88 (04/05 0527) Resp:  [17-18] 17 (04/05 0527) BP: (130-145)/(70-80) 130/70 (04/05 0527) SpO2:  [94 %-97 %] 97 % (04/05 0527)  Intake/Output from previous day:  Intake/Output Summary (Last 24 hours) at 03/26/2023 0759 Last data filed at 03/26/2023 0558 Gross per 24 hour  Intake 685 ml  Output 825 ml  Net -140 ml     Intake/Output this shift: No intake/output data recorded.  Labs: Recent Labs    03/24/23 0348 03/25/23 0330  HGB 10.3* 10.1*   Recent Labs    03/24/23 0348 03/25/23 0330  WBC 5.7 6.0  RBC 3.46* 3.40*  HCT 31.1* 30.5*  PLT 212 210   Recent Labs    03/24/23 0348  NA 136  K 4.1  CL 103  CO2 25  BUN 12  CREATININE 0.91  GLUCOSE 148*  CALCIUM 8.6*   No results for input(s): "LABPT", "INR" in the last 72 hours.  Exam: General - Patient is Alert and Oriented Extremity - Neurologically intact Sensation intact distally Intact pulses distally Dorsiflexion/Plantar flexion intact Dressing - dressing C/D/I Motor Function - intact, moving foot and toes well on exam.   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   Weight as of this encounter: 109.3 kg. Advance diet Up with therapy   DVT Prophylaxis - Aspirin Weight bearing as tolerated.  Pain meds changed to hydromorphone today to see if this provides any benefit.   Up with PT  Home when meeting goals with therapy for safe discharge.   Dennie Bible, PA-C Orthopedic Surgery [phone] 03/26/2023, 7:59 AM

## 2023-03-26 NOTE — Progress Notes (Signed)
Patient refused Miralax at hs.

## 2023-03-27 MED ORDER — FLEET ENEMA 7-19 GM/118ML RE ENEM
1.0000 | ENEMA | Freq: Every day | RECTAL | Status: DC | PRN
  Filled 2023-03-27: qty 1

## 2023-03-27 NOTE — Progress Notes (Addendum)
Physical Therapy Treatment Patient Details Name: Charles Weiss. MRN: [id number] DOB: Mar 18, 1951 Today's Date: 03/27/2023  Clinical Impression: Pt seen POD3 for second of two sessions upright in recliner upon arrival eager to participate in therapy. Pt required min guard for transfers with RW, min guard for ambulation 287ft with RW, no physical assist required or overt LOB noted. Pt demonstrated modified independence with bed mobility in flat bed with use of bed rail and gait belt to self-assist with RLE for sit to supine and supine to sit. Reviewed bed-level exercises with pt with increasing levels of pain reported via crying/grunting despite use of gait-belt to self-assist and appropriate recovery periods provided, pt requiring verbal cues for form and occasional demonstration or AAROM especially for SLR. Provided various means to attach Charles Weiss to RW for pt to be able to transport SPC to stairs as pt does not have assistance at home, pt participatory in education and brainstorming of solutions. Pt supine in bed with all needs met and RN in the room at end of session. Pt will continue to benefit from acute skilled therapy to address functional limitations.   03/27/23 1532  PT Visit Information  Last PT Received On 03/27/23  Assistance Needed +1  History of Present Illness 72 y.o. male admitted 03/24/23 for R TKA. PMH: HTN, asthma, neuropathy, PTSD, B shoulder surgery, COPD, back pain.  Subjective Data  Subjective let's go  Patient Stated Goal yard work  Precautions  Precautions Knee;Fall  Precaution Booklet Issued Yes (comment)  Precaution Comments reviewed no pillow under knee  Restrictions  Weight Bearing Restrictions No  RLE Weight Bearing WBAT  Other Position/Activity Restrictions WBAT  Pain Assessment  Pain Assessment 0-10  Pain Score 7  Pain Location R knee and thigh  Pain Descriptors / Indicators Operative site guarding;Grimacing  Pain Intervention(s) Monitored during  session;Repositioned;Ice applied  Cognition  Arousal/Alertness Awake/alert  Behavior During Therapy WFL for tasks assessed/performed  Overall Cognitive Status Within Functional Limits for tasks assessed  Bed Mobility  Overal bed mobility Needs Assistance  Bed Mobility Sit to Supine;Supine to Sit  Supine to sit Modified independent (Device/Increase time)  Sit to supine Modified independent (Device/Increase time)  General bed mobility comments Pt utilized gait belt to self assist (as well as LLE), provided leg lifter but pt declined to utilize citing pain across incisional area. Supine to sit from flat bed pt modified independent with use of bed rail and gait belt to self-assist, no physical assist or cuing provided.  Transfers  Overall transfer level Needs assistance  Equipment used Rolling walker (2 wheels)  Transfers Sit to/from Stand  Sit to Stand Min guard  General transfer comment Increased time, no cuing, no physical assist, pt required two attempts and rocking method.  Ambulation/Gait  Ambulation/Gait assistance Min guard  Gait Distance (Feet) 200 Feet  Assistive device Rolling walker (2 wheels)  Gait Pattern/deviations Step-to pattern;Decreased step length - right;Decreased step length - left  General Gait Details Pt ambulated with RW and minguard, no loss of balance, antalgic gait pattern with heavy bUE use of RW, seated rest break so distances 20+180.  Gait velocity decr  Balance  Overall balance assessment Needs assistance  Sitting-balance support Feet supported;No upper extremity supported  Sitting balance-Leahy Scale Good  Standing balance support Reliant on assistive device for balance;During functional activity;Bilateral upper extremity supported  Standing balance-Leahy Scale Poor  Total Joint Exercises  Ankle Circles/Pumps AROM;Both;10 reps;Supine  Quad Sets AROM;Both;Supine;10 reps  Short Arc [employer];Right;10 reps;Supine  Heel Slides AAROM;Right;10  reps;Supine (with belt)  Hip ABduction/ADduction AAROM;Right;10 reps;Supine  Straight Leg Raises AAROM;Right;Supine;10 reps  Goniometric ROM -5-45deg by gross visual approximation  PT - End of Session  Equipment Utilized During Treatment Gait belt  Activity Tolerance Patient limited by pain  Patient left with call bell/phone within reach;with chair alarm set;in chair;with nursing/sitter in room  Nurse Communication Mobility status   PT - Assessment/Plan  PT Plan Current plan remains appropriate  PT Visit Diagnosis Difficulty in walking, not elsewhere classified (R26.2);Pain  Pain - Right/Left Right  Pain - part of body Knee  PT Frequency (ACUTE ONLY) 7X/week  Follow Up Recommendations Follow physician's recommendations for discharge plan and follow up therapies  Assistance recommended at discharge Intermittent Supervision/Assistance  Patient can return home with the following A little help with bathing/dressing/bathroom;Assistance with cooking/housework;Assist for transportation;Help with stairs or ramp for entrance;A little help with walking and/or transfers  PT equipment Rolling walker (2 wheels)  AM-PAC PT "6 Clicks" Mobility Outcome Measure (Version 2)  Help needed turning from your back to your side while in a flat bed without using bedrails? 3  Help needed moving from lying on your back to sitting on the side of a flat bed without using bedrails? 3  Help needed moving to and from a bed to a chair (including a wheelchair)? 3  Help needed standing up from a chair using your arms (e.g., wheelchair or bedside chair)? 3  Help needed to walk in Weiss room? 3  Help needed climbing 3-5 steps with a railing?  1  6 Click Score 16  Consider Recommendation of Discharge To: Home with [facility] [facility]  Progressive Mobility  What is the highest level of mobility based on the progressive mobility assessment? Level 5 (Walks with assist in room/hall) - Balance while stepping forward/back and can walk in room  with assist - Complete  Mobility Referral No  Activity Ambulated with assistance in room  PT Goal Progression  Progress towards PT goals Progressing toward goals  Acute Rehab PT Goals  PT Goal Formulation With patient  Time For Goal Achievement 03/31/23  Potential to Achieve Goals Good  PT Time Calculation  PT Start Time (ACUTE ONLY) 1530  PT Stop Time (ACUTE ONLY) 1600  PT Time Calculation (min) (ACUTE ONLY) 30 min  PT General Charges  $$ ACUTE PT VISIT 1 Visit  PT Treatments  $Gait Training 8-22 mins  $Therapeutic Exercise 8-22 mins   Jamesetta Geralds, PT, DPT [facility] Rehabilitation Department Office: [phone] Weekend pager: [phone]

## 2023-03-27 NOTE — Plan of Care (Signed)
  Problem: Coping: Goal: Level of anxiety will decrease Outcome: Progressing   Problem: Pain Managment: Goal: General experience of comfort will improve Outcome: Progressing   

## 2023-03-27 NOTE — Progress Notes (Signed)
Physical Therapy Treatment Patient Details Name: Charles Weiss. MRN: [id number] DOB: 08/12/1951 Today's Date: 03/27/2023   History of Present Illness 72 y.o. male admitted 03/24/23 for R TKA. PMH: HTN, asthma, neuropathy, PTSD, B shoulder surgery, COPD, back pain.    PT Comments    Pt seen POD3 for first of two sessions asleep upon arrival but rouses easily and agreeable to therapy. Pt demonstrated modified independence with mobility with HOB elevated and use of gait belt to self-assist. Pt required min guard for transfers and ambulation in hallway with RW. Attempted stair training via two methods but pt unable to bear weight significantly through RLE to allow progression of LLE onto stair, discontinued attempts secondary to pain and fatigue. Discussed possible methods and demonstrated options; feel that attaching SPC to RW to allow pt to carry it with him for stair mobility is most likely scenario to allow for safe ingress/egress without assistance, which the pt does not have. We will continue to follow acutely.    Recommendations for follow up therapy are one component of a multi-disciplinary discharge planning process, led by the attending physician.  Recommendations may be updated based on patient status, additional functional criteria and insurance authorization.     Assistance Recommended at Discharge Intermittent Supervision/Assistance  Patient can return home with the following A little help with bathing/dressing/bathroom;Assistance with cooking/housework;Assist for transportation;Help with stairs or ramp for entrance;A little help with walking and/or transfers   Equipment Recommendations  Rolling walker (2 wheels)    Recommendations for Other Services       Precautions / Restrictions Precautions Precautions: Knee;Fall Precaution Booklet Issued: Yes (comment) Precaution Comments: reviewed no pillow under knee Restrictions Weight Bearing Restrictions: No RLE Weight  Bearing: Weight bearing as tolerated Other Position/Activity Restrictions: WBAT     Mobility  Bed Mobility Overal bed mobility: Needs Assistance Bed Mobility: Supine to Sit     Supine to sit: HOB elevated, Modified independent (Device/Increase time)     General bed mobility comments: Pt utilized gait belt to self assist (as well as LLE) with HOB elevated    Transfers Overall transfer level: Needs assistance Equipment used: Rolling walker (2 wheels) Transfers: Sit to/from Stand Sit to Stand: From elevated surface, Min guard           General transfer comment: Increased time, no cuing, no physical assist    Ambulation/Gait Ambulation/Gait assistance: Min guard Gait Distance (Feet): 100 Feet Assistive device: Rolling walker (2 wheels) Gait Pattern/deviations: Step-to pattern, Decreased step length - right, Decreased step length - left Gait velocity: decr     General Gait Details: 15'+ 8685'  with seated rest break, no loss of balance, distance limited by R knee pain, antalgic gait pattern with heavy bUE use of RW.   Stairs Stairs: Yes Stairs assistance: Min guard Stair Management: Step to pattern, With walker, One rail Left, Forwards, Backwards Number of Stairs: 0 General stair comments: Attempted stair mobility trial via two methods: backwards with RW and forwards with RW on RUE and Railing on L; pt unable to WB on RLE and unable to place either foot on first step despite multiple attempts, discontinued secondary to fatigue and pain report. VCs for sequencing, min guard with demonstration of both techniques and discussion of stair setup   Wheelchair Mobility    Modified Rankin (Stroke Patients Only)       Balance Overall balance assessment: Needs assistance Sitting-balance support: Feet supported, No upper extremity supported Sitting balance-Leahy Scale: Good  Standing balance support: Reliant on assistive device for balance, During functional activity,  Bilateral upper extremity supported Standing balance-Leahy Scale: Poor                              Cognition Arousal/Alertness: Awake/alert Behavior During Therapy: WFL for tasks assessed/performed Overall Cognitive Status: Within Functional Limits for tasks assessed                                          Exercises      General Comments        Pertinent Vitals/Pain Pain Assessment Pain Assessment: 0-10 Pain Score: 7  Pain Location: R knee and thigh Pain Descriptors / Indicators: Operative site guarding, Grimacing Pain Intervention(s): Monitored during session, Repositioned, Ice applied, Patient requesting pain meds-RN notified    Home Living                          Prior Function            PT Goals (current goals can now be found in the care plan section) Acute Rehab PT Goals Patient Stated Goal: yard work PT Goal Formulation: With patient Time For Goal Achievement: 03/31/23 Potential to Achieve Goals: Good Progress towards PT goals: Progressing toward goals    Frequency    7X/week      PT Plan Current plan remains appropriate    Co-evaluation              AM-PAC PT "6 Clicks" Mobility   Outcome Measure  Help needed turning from your back to your side while in a flat bed without using bedrails?: A Little Help needed moving from lying on your back to sitting on the side of a flat bed without using bedrails?: A Little Help needed moving to and from a bed to a chair (including a wheelchair)?: A Little Help needed standing up from a chair using your arms (e.g., wheelchair or bedside chair)?: A Little Help needed to walk in hospital room?: A Little Help needed climbing 3-5 steps with a railing? : Total 6 Click Score: 16    End of Session Equipment Utilized During Treatment: Gait belt Activity Tolerance: Patient limited by pain Patient left: with call bell/phone within reach;with chair alarm set;in  bed Nurse Communication: Mobility status PT Visit Diagnosis: Difficulty in walking, not elsewhere classified (R26.2);Pain Pain - Right/Left: Right Pain - part of body: Knee     Time: 8413-2440 PT Time Calculation (min) (ACUTE ONLY): 39 min  Charges:  $Gait Training: 23-37 mins $Therapeutic Activity: 8-22 mins                     Jamesetta Geralds, PT, DPT [facility] Rehabilitation Department Office: [phone] Weekend pager: [phone]   Jamesetta Geralds 03/27/2023, 1:40 PM

## 2023-03-27 NOTE — Progress Notes (Signed)
Charles Weiss.  MRN: [id number] DOB/Age: [birth year] 72 y.o. [facility] Procedure: Procedure(s) (LRB): TOTAL KNEE ARTHROPLASTY (Right)     Subjective: Currently biggest issue is lack of bowel movement, Nursing asked for fleets as this has helped in past.   Patient also relays multiple barriers to DC home due to his social/living situation  Vital Signs Temp:  [98.8 F (37.1 C)-99.3 F (37.4 C)] 99.3 F (37.4 C) (04/06 0520) Pulse Rate:  [79-94] 79 (04/06 0520) Resp:  [17-18] 18 (04/06 0520) BP: (89-110)/(55-68) 110/68 (04/06 0520) SpO2:  [92 %-93 %] 92 % (04/06 0520)  Lab Results Recent Labs    03/25/23 0330  WBC 6.0  HGB 10.1*  HCT 30.5*  PLT 210   BMET No results for input(s): "NA", "K", "CL", "CO2", "GLUCOSE", "BUN", "CREATININE", "CALCIUM" in the last 72 hours. INR  Date Value Ref Range Status  11/02/2021 1.3 (H) 0.8 - 1.2 Final    Comment:    (NOTE) INR goal varies based on device and disease states. Performed at [facility], [address] [place], [place] [address]      Exam Knee dressing is dry Minimal swelling         Plan Focus on bowels today and continue to mobilize Fleets today Will need to be more independent in mobility prior to DC    [facility] PA-C  03/27/2023, 9:38 AM Contact # [phone]

## 2023-03-28 DIAGNOSIS — M1711 Unilateral primary osteoarthritis, right knee: Secondary | ICD-10-CM | POA: Diagnosis present

## 2023-03-28 DIAGNOSIS — F4310 Post-traumatic stress disorder, unspecified: Secondary | ICD-10-CM | POA: Diagnosis present

## 2023-03-28 DIAGNOSIS — Z883 Allergy status to other anti-infective agents status: Secondary | ICD-10-CM | POA: Diagnosis not present

## 2023-03-28 DIAGNOSIS — Z91041 Radiographic dye allergy status: Secondary | ICD-10-CM | POA: Diagnosis not present

## 2023-03-28 DIAGNOSIS — I10 Essential (primary) hypertension: Secondary | ICD-10-CM | POA: Diagnosis present

## 2023-03-28 DIAGNOSIS — Z8616 Personal history of COVID-19: Secondary | ICD-10-CM | POA: Diagnosis not present

## 2023-03-28 DIAGNOSIS — E785 Hyperlipidemia, unspecified: Secondary | ICD-10-CM | POA: Diagnosis present

## 2023-03-28 DIAGNOSIS — J4489 Other specified chronic obstructive pulmonary disease: Secondary | ICD-10-CM | POA: Diagnosis present

## 2023-03-28 DIAGNOSIS — Z808 Family history of malignant neoplasm of other organs or systems: Secondary | ICD-10-CM | POA: Diagnosis not present

## 2023-03-28 DIAGNOSIS — D62 Acute posthemorrhagic anemia: Secondary | ICD-10-CM | POA: Diagnosis not present

## 2023-03-28 DIAGNOSIS — M659 Synovitis and tenosynovitis, unspecified: Secondary | ICD-10-CM | POA: Diagnosis present

## 2023-03-28 DIAGNOSIS — Z1152 Encounter for screening for COVID-19: Secondary | ICD-10-CM | POA: Diagnosis not present

## 2023-03-28 DIAGNOSIS — K219 Gastro-esophageal reflux disease without esophagitis: Secondary | ICD-10-CM | POA: Diagnosis present

## 2023-03-28 DIAGNOSIS — Z888 Allergy status to other drugs, medicaments and biological substances status: Secondary | ICD-10-CM | POA: Diagnosis not present

## 2023-03-28 DIAGNOSIS — Z79899 Other long term (current) drug therapy: Secondary | ICD-10-CM | POA: Diagnosis not present

## 2023-03-28 DIAGNOSIS — R7303 Prediabetes: Secondary | ICD-10-CM | POA: Diagnosis present

## 2023-03-28 DIAGNOSIS — Z8249 Family history of ischemic heart disease and other diseases of the circulatory system: Secondary | ICD-10-CM | POA: Diagnosis not present

## 2023-03-28 DIAGNOSIS — Z87891 Personal history of nicotine dependence: Secondary | ICD-10-CM | POA: Diagnosis not present

## 2023-03-28 DIAGNOSIS — Z8546 Personal history of malignant neoplasm of prostate: Secondary | ICD-10-CM | POA: Diagnosis not present

## 2023-03-28 DIAGNOSIS — Z6835 Body mass index (BMI) 35.0-35.9, adult: Secondary | ICD-10-CM | POA: Diagnosis not present

## 2023-03-28 DIAGNOSIS — Z8262 Family history of osteoporosis: Secondary | ICD-10-CM | POA: Diagnosis not present

## 2023-03-28 DIAGNOSIS — E669 Obesity, unspecified: Secondary | ICD-10-CM | POA: Diagnosis present

## 2023-03-28 MED ORDER — PANTOPRAZOLE SODIUM 40 MG PO TBEC: 40.0000 mg | DELAYED_RELEASE_TABLET | Freq: Two times a day (BID) | ORAL | Status: DC

## 2023-03-28 MED ORDER — PANTOPRAZOLE SODIUM 40 MG PO TBEC
40.0000 mg | DELAYED_RELEASE_TABLET | Freq: Two times a day (BID) | ORAL | Status: DC
  Administered 2023-03-28 – 2023-03-30 (×4): 40 mg via ORAL
  Filled 2023-03-28 (×4): qty 1

## 2023-03-28 MED ORDER — KETOROLAC TROMETHAMINE 15 MG/ML IJ SOLN
7.5000 mg | Freq: Once | INTRAMUSCULAR | Status: AC
  Administered 2023-03-28: 7.5 mg via INTRAVENOUS
  Filled 2023-03-28: qty 1

## 2023-03-28 MED ORDER — TIZANIDINE HCL 4 MG PO TABS
2.0000 mg | ORAL_TABLET | Freq: Three times a day (TID) | ORAL | Status: DC | PRN
  Administered 2023-03-28 – 2023-03-30 (×3): 2 mg via ORAL
  Filled 2023-03-28 (×4): qty 1

## 2023-03-28 NOTE — Progress Notes (Signed)
Physical Therapy Treatment Patient Details Name: Charles Weiss. MRN: [id number] DOB: 1951/01/06 Today's Date: 03/28/2023   History of Present Illness 72 y.o. male admitted 03/24/23 for R TKA. PMH: HTN, asthma, neuropathy, PTSD, B shoulder surgery, COPD, back pain.    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   Recommendations for follow up therapy are one component of a multi-disciplinary discharge planning process, led by the attending physician.  Recommendations may be updated based on patient status, additional functional criteria and insurance authorization.  Follow Up Recommendations       Assistance Recommended at Discharge Intermittent Supervision/Assistance  Patient can return home with the following A little help with bathing/dressing/bathroom;Assistance with cooking/housework;Assist for transportation;Help with stairs or ramp for entrance;A little help with walking and/or transfers   Equipment Recommendations  Rolling walker (2 wheels)    Recommendations for Other Services       Precautions / Restrictions Precautions Precautions: Knee;Fall Restrictions Weight Bearing Restrictions: No RLE Weight Bearing: Weight bearing as tolerated     Mobility  Bed Mobility Overal bed mobility: Needs Assistance Bed Mobility: Sit to Supine, Supine to Sit     Supine to sit: Modified independent (Device/Increase time)     General bed mobility comments: Pt utilized gait belt to self assist (as well as LLE). Supine to sit from flat bed pt modified independent with use of bed rail and gait belt to self-assist, no physical assist or cuing provided.    Transfers Overall transfer level: Needs assistance Equipment used: Rolling walker (2  wheels) Transfers: Sit to/from Stand Sit to Stand: Min guard           General transfer comment: Increased time with cueing for LE management and use of UEs to self assist    Ambulation/Gait Ambulation/Gait assistance: Min guard Gait Distance (Feet): 200 Feet Assistive device: Rolling walker (2 wheels) Gait Pattern/deviations: Step-to pattern, Decreased step length - right, Decreased step length - left Gait velocity: decr     General Gait Details: Pt ambulated with RW and minguard, no loss of balance, antalgic gait pattern with heavy bUE use of RW,   Stairs             Wheelchair Mobility    Modified Rankin (Stroke Patients Only)       Balance Overall balance assessment: Needs assistance Sitting-balance support: Feet supported, No upper extremity supported Sitting balance-Leahy Scale: Good     Standing balance support: Reliant on assistive device for balance, During functional activity, Bilateral upper extremity supported Standing balance-Leahy Scale: Poor                              Cognition Arousal/Alertness: Awake/alert Behavior During Therapy: WFL for tasks assessed/performed Overall Cognitive Status: Within Functional Limits for tasks assessed                                          Exercises Total Joint Exercises Ankle Circles/Pumps: AROM, Both, Supine, 15 reps Quad Sets: AROM, Both, Supine, 10 reps Heel Slides: AAROM, Right, Supine, 15 reps Hip ABduction/ADduction: AAROM,  Right, 10 reps, Supine Straight Leg Raises: AAROM, Right, Supine, 10 reps Goniometric ROM: -5 - 55 AAROM R knee    General Comments        Pertinent Vitals/Pain Pain Assessment Pain Assessment: 0-10 Pain Score: 6  Pain Location: R knee and thigh Pain Descriptors / Indicators: Operative site guarding, Grimacing Pain Intervention(s): Limited activity within patient's tolerance, Monitored during session, Premedicated before session, Ice  applied    Home Living                          Prior Function            PT Goals (current goals can now be found in the care plan section) Acute Rehab PT Goals Patient Stated Goal: yard work PT Goal Formulation: With patient Time For Goal Achievement: 03/31/23 Potential to Achieve Goals: Good Progress towards PT goals: Progressing toward goals    Frequency    7X/week      PT Plan Current plan remains appropriate    Co-evaluation              AM-PAC PT "6 Clicks" Mobility   Outcome Measure  Help needed turning from your back to your side while in a flat bed without using bedrails?: A Little Help needed moving from lying on your back to sitting on the side of a flat bed without using bedrails?: A Little Help needed moving to and from a bed to a chair (including a wheelchair)?: A Little Help needed standing up from a chair using your arms (e.g., wheelchair or bedside chair)?: A Little Help needed to walk in hospital room?: A Little Help needed climbing 3-5 steps with a railing? : Total 6 Click Score: 16    End of Session Equipment Utilized During Treatment: Gait belt Activity Tolerance: Patient limited by pain Patient left: in chair;with call bell/phone within reach;with nursing/sitter in room Nurse Communication: Mobility status PT Visit Diagnosis: Difficulty in walking, not elsewhere classified (R26.2);Pain Pain - Right/Left: Right Pain - part of body: Knee     Time: 8638-1771 PT Time Calculation (min) (ACUTE ONLY): 45 min  Charges:  $Gait Training: 23-37 mins $Therapeutic Exercise: 8-22 mins                     Mauro Kaufmann PT Acute Rehabilitation Services Pager [phone] Office [phone]    [facility] 03/28/2023, 12:37 PM

## 2023-03-28 NOTE — Progress Notes (Signed)
Physical Therapy Treatment Patient Details Name: Charles Weiss. MRN: [id number] DOB: [birth year] Today's Date: 03/28/2023   History of Present Illness 72 y.o. male admitted 03/24/23 for R TKA. PMH: HTN, asthma, neuropathy, PTSD, B shoulder surgery, COPD, back pain.    PT Comments    Pt continues cooperative but requiring increased time for all tasks and performance limited by increased pain - pt had declined premed x 2 prior to session.  Pt up to ambulate in halls but declines to attempt stairs 2* increased pain and "I think I should do this in the morning when [name] fresher".  Multiple questions asked and answered.   Recommendations for follow up therapy are one component of a multi-disciplinary discharge planning process, led by the attending physician.  Recommendations may be updated based on patient status, additional functional criteria and insurance authorization.  Follow Up Recommendations       Assistance Recommended at Discharge Intermittent Supervision/Assistance  Patient can return home with the following A little help with bathing/dressing/bathroom;Assistance with cooking/housework;Assist for transportation;Help with stairs or ramp for entrance;A little help with walking and/or transfers   Equipment Recommendations  Rolling walker (2 wheels)    Recommendations for Other Services       Precautions / Restrictions Precautions Precautions: Knee;Fall Restrictions Weight Bearing Restrictions: No RLE Weight Bearing: Weight bearing as tolerated     Mobility  Bed Mobility Overal bed mobility: Needs Assistance Bed Mobility: Sit to Supine     Supine to sit: Modified independent (Device/Increase time) Sit to supine: Modified independent (Device/Increase time)   General bed mobility comments: Pt utilized gait belt to self assist (as well as LLE).    Transfers Overall transfer level: Needs assistance Equipment used: Rolling walker (2 wheels) Transfers: Sit to/from  Stand Sit to Stand: Min guard           General transfer comment: Increased time with cueing for LE management and use of UEs to self assist    Ambulation/Gait Ambulation/Gait assistance: Min guard Gait Distance (Feet): 124 Feet Assistive device: Rolling walker (2 wheels) Gait Pattern/deviations: Step-to pattern, Decreased step length - right, Decreased step length - left, Antalgic, Trunk flexed Gait velocity: decr     General Gait Details: Pt ambulated with RW and minguard, no loss of balance, antalgic gait pattern with heavy bUE use of RW.  Distance ltd by increased pain   Stairs             Wheelchair Mobility    Modified Rankin (Stroke Patients Only)       Balance Overall balance assessment: Needs assistance Sitting-balance support: Feet supported, No upper extremity supported Sitting balance-Leahy Scale: Good     Standing balance support: Single extremity supported Standing balance-Leahy Scale: Poor                              Cognition Arousal/Alertness: Awake/alert Behavior During Therapy: WFL for tasks assessed/performed Overall Cognitive Status: Within Functional Limits for tasks assessed                                          Exercises Total Joint Exercises Ankle Circles/Pumps: AROM, Both, Supine, 15 reps Quad Sets: AROM, Both, Supine, 10 reps Heel Slides: AAROM, Right, Supine, 15 reps Hip ABduction/ADduction: AAROM, Right, 10 reps, Supine Straight Leg Raises: AAROM, Right, Supine, 10 reps Goniometric  ROM: -5 - 55 AAROM R knee    General Comments        Pertinent Vitals/Pain Pain Assessment Pain Assessment: 0-10 Pain Score: 8  Pain Location: R knee and thigh Pain Descriptors / Indicators: Operative site guarding, Grimacing Pain Intervention(s): Limited activity within patient's tolerance, Monitored during session, Ice applied (pt declined premed when offered)    Home Living                           Prior Function            PT Goals (current goals can now be found in the care plan section) Acute Rehab PT Goals Patient Stated Goal: yard work PT Goal Formulation: With patient Time For Goal Achievement: 03/31/23 Potential to Achieve Goals: Good Progress towards PT goals: Progressing toward goals    Frequency    7X/week      PT Plan Current plan remains appropriate    Co-evaluation              AM-PAC PT "6 Clicks" Mobility   Outcome Measure  Help needed turning from your back to your side while in a flat bed without using bedrails?: A Little Help needed moving from lying on your back to sitting on the side of a flat bed without using bedrails?: A Little Help needed moving to and from a bed to a chair (including a wheelchair)?: A Little Help needed standing up from a chair using your arms (e.g., wheelchair or bedside chair)?: A Little Help needed to walk in hospital room?: A Little Help needed climbing 3-5 steps with a railing? : Total 6 Click Score: 16    End of Session Equipment Utilized During Treatment: Gait belt Activity Tolerance: Patient limited by pain Patient left: in bed;with call bell/phone within reach;with bed alarm set Nurse Communication: Mobility status PT Visit Diagnosis: Difficulty in walking, not elsewhere classified (R26.2);Pain Pain - Right/Left: Right Pain - part of body: Knee     Time: 1427-1450 PT Time Calculation (min) (ACUTE ONLY): 23 min  Charges:  $Gait Training: 8-22 mins $Therapeutic Activity: 8-22 mins                     Mauro Kaufmann PT Acute Rehabilitation Services Pager [phone] Office [phone]    Taite Baldassari 03/28/2023, 4:19 PM

## 2023-03-28 NOTE — Progress Notes (Signed)
   Subjective: 5 Days Post-Op Procedure(s) (LRB): TOTAL KNEE ARTHROPLASTY (Right) Patient reports pain as moderate.   Patient seen in rounds for Dr. Charlann Boxer. Patient is well, and has had no acute complaints or problems. No acute events overnight. Ambulated well with PT yesterday, but still has not been able to do stairs. He is still having significant pain, particularly with lifting his leg.  We will continue therapy today.   Objective: Vital signs in last 24 hours: Temp:  [97.9 F (36.6 C)-98.6 F (37 C)] 98.6 F (37 C) (04/07 0514) Pulse Rate:  [74-81] 74 (04/07 0514) Resp:  [18-19] 18 (04/07 0514) BP: (97-120)/(54-70) 105/54 (04/07 0514) SpO2:  [91 %-94 %] 92 % (04/07 0514)  Intake/Output from previous day:  Intake/Output Summary (Last 24 hours) at 03/28/2023 0804 Last data filed at 03/28/2023 0600 Gross per 24 hour  Intake 450 ml  Output 1375 ml  Net -925 ml     Intake/Output this shift: No intake/output data recorded.  Labs: No results for input(s): "HGB" in the last 72 hours. No results for input(s): "WBC", "RBC", "HCT", "PLT" in the last 72 hours. No results for input(s): "NA", "K", "CL", "CO2", "BUN", "CREATININE", "GLUCOSE", "CALCIUM" in the last 72 hours. No results for input(s): "LABPT", "INR" in the last 72 hours.  Exam: General - Patient is Alert and Oriented Extremity - Neurologically intact Sensation intact distally Intact pulses distally Dorsiflexion/Plantar flexion intact Dressing - dressing C/D/I Motor Function - intact, moving foot and toes well on exam.   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   Weight as of this encounter: 109.3 kg. Advance diet Up with therapy    DVT Prophylaxis - Aspirin Weight bearing as tolerated.  Will change muscle relaxant to tizanidine Will give 1 dose of toradol for pain relief  Up with PT again today Hopeful for discharge home by Monday-Tuesday, dependent on him being able to go up/down stairs  Dennie Bible, PA-C Orthopedic Surgery [phone] 03/28/2023, 8:04 AM

## 2023-03-29 NOTE — Progress Notes (Addendum)
Physical Therapy Treatment Patient Details Name: Charles Weiss. MRN: [id number] DOB: 17-Dec-1951 Today's Date: 03/29/2023   History of Present Illness 72 y.o. male admitted 03/24/23 for R TKA. PMH: HTN, asthma, neuropathy, PTSD, B shoulder surgery, COPD, back pain.    PT Comments    Reviewed gait, stairs; pt is reaching his maximum, potential for acute setting. He is able to mobilize to EOB, STS and amb at supervision to mod I level. Pt with c/o of NT not assisting with peri-care and I have reinforced that our role here is to make him as IND as possible and that he does not and did not have assist for these tasks prior to surgery.  Pt was able to ascend 3 steps  with supervision and cues for sequence. Will follow in acute setting however pt is ready to d/c from PT standpoint; pt should be IND with HEP at this time, it is POD #6   Recommendations for follow up therapy are one component of a multi-disciplinary discharge planning process, led by the attending physician.  Recommendations may be updated based on patient status, additional functional criteria and insurance authorization.  Follow Up Recommendations       Assistance Recommended at Discharge Intermittent Supervision/Assistance  Patient can return home with the following A little help with bathing/dressing/bathroom;Assistance with cooking/housework;Assist for transportation;Help with stairs or ramp for entrance;A little help with walking and/or transfers   Equipment Recommendations  Rolling walker (2 wheels)    Recommendations for Other Services       Precautions / Restrictions Precautions Precautions: Knee;Fall Restrictions Weight Bearing Restrictions: No RLE Weight Bearing: Weight bearing as tolerated     Mobility  Bed Mobility Overal bed mobility: Needs Assistance Bed Mobility: Supine to Sit     Supine to sit: Modified independent (Device/Increase time) Sit to supine: Modified independent (Device/Increase  time)   General bed mobility comments: Pt utilized gait belt to self assist (as well as LLE).    Transfers Overall transfer level: Needs assistance Equipment used: Rolling walker (2 wheels) Transfers: Sit to/from Stand             General transfer comment: Increased time with cueing for LE management and use of UEs to self assist    Ambulation/Gait Ambulation/Gait assistance: Min guard, Supervision Gait Distance (Feet): 40 Feet (20') Assistive device: Rolling walker (2 wheels) Gait Pattern/deviations: Step-to pattern, Decreased step length - right, Decreased step length - left, Antalgic, Trunk flexed Gait velocity: decr     General Gait Details: Pt ambulated with RW and minguard, no loss of balance, antalgic gait pattern with heavy bUE use of RW.  Distance ltd by increased pain   Stairs Stairs: Yes Stairs assistance: Min guard Stair Management: Step to pattern, Forwards, One rail Right, One rail Left, With cane Number of Stairs: 3 General stair comments: cues for sequence and safety; min/guard for safety, no knee buckling, no LOB;   Wheelchair Mobility    Modified Rankin (Stroke Patients Only)       Balance Overall balance assessment: Needs assistance Sitting-balance support: Feet supported, No upper extremity supported Sitting balance-Leahy Scale: Good     Standing balance support: Single extremity supported Standing balance-Leahy Scale: Poor Standing balance comment: reliant on at least unilateral UE support                            Cognition Arousal/Alertness: Awake/alert Behavior During Therapy: WFL for tasks assessed/performed Overall Cognitive Status:  Within Functional Limits for tasks assessed                                          Exercises      General Comments        Pertinent Vitals/Pain Pain Assessment Pain Assessment: 0-10 Pain Score: 4  Pain Location: R knee and thigh Pain Descriptors / Indicators:  Operative site guarding, Grimacing Pain Intervention(s): Limited activity within patient's tolerance, Monitored during session, Premedicated before session    Home Living                          Prior Function            PT Goals (current goals can now be found in the care plan section) Acute Rehab PT Goals Patient Stated Goal: yard work PT Goal Formulation: With patient Time For Goal Achievement: 03/31/23 Potential to Achieve Goals: Good Progress towards PT goals: Progressing toward goals    Frequency    7X/week      PT Plan Current plan remains appropriate    Co-evaluation              AM-PAC PT "6 Clicks" Mobility   Outcome Measure  Help needed turning from your back to your side while in a flat bed without using bedrails?: A Little Help needed moving from lying on your back to sitting on the side of a flat bed without using bedrails?: A Little Help needed moving to and from a bed to a chair (including a wheelchair)?: A Little Help needed standing up from a chair using your arms (e.g., wheelchair or bedside chair)?: A Little Help needed to walk in hospital room?: A Little Help needed climbing 3-5 steps with a railing? : Total 6 Click Score: 16    End of Session Equipment Utilized During Treatment: Gait belt Activity Tolerance: Patient limited by pain Patient left: with call bell/phone within reach;in chair;with chair alarm set Nurse Communication: Mobility status PT Visit Diagnosis: Difficulty in walking, not elsewhere classified (R26.2);Pain Pain - Right/Left: Right Pain - part of body: Knee     Time: 9093-1121 PT Time Calculation (min) (ACUTE ONLY): 38 min  Charges:  $Gait Training: 38-52 mins                     Ebonee Stober, PT  Acute Rehab Dept [facility]) [phone]  03/29/2023    [facility] 03/29/2023, 11:50 AM

## 2023-03-29 NOTE — Progress Notes (Signed)
   Subjective: 6 Days Post-Op Procedure(s) (LRB): TOTAL KNEE ARTHROPLASTY (Right) Patient reports pain as mild.   Patient seen in rounds for Dr. Charlann Boxer. Patient is well, and has had no acute complaints or problems. No acute events overnight. Resting in bed this morning. Did well with PT yesterday, but still has not completed stair training. Plans to try this today. Has had a BM in hospital, voiding without difficulty. We will continue therapy today.   Objective: Vital signs in last 24 hours: Temp:  [97.9 F (36.6 C)-98.4 F (36.9 C)] 97.9 F (36.6 C) (04/08 0502) Pulse Rate:  [71-82] 71 (04/08 0502) Resp:  [17-20] 18 (04/08 0502) BP: (106-120)/(54-75) 106/54 (04/08 0502) SpO2:  [94 %-97 %] 97 % (04/08 0502)  Intake/Output from previous day:  Intake/Output Summary (Last 24 hours) at 03/29/2023 0724 Last data filed at 03/29/2023 0500 Gross per 24 hour  Intake 840 ml  Output 540 ml  Net 300 ml     Intake/Output this shift: No intake/output data recorded.  Labs: No results for input(s): "HGB" in the last 72 hours. No results for input(s): "WBC", "RBC", "HCT", "PLT" in the last 72 hours. No results for input(s): "NA", "K", "CL", "CO2", "BUN", "CREATININE", "GLUCOSE", "CALCIUM" in the last 72 hours. No results for input(s): "LABPT", "INR" in the last 72 hours.  Exam: General - Patient is Alert and Oriented Extremity - Neurologically intact Sensation intact distally Intact pulses distally Dorsiflexion/Plantar flexion intact Dressing - dressing C/D/I Motor Function - intact, moving foot and toes well on exam.   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   Weight as of this encounter: 109.3 kg. Advance diet Up with therapy   DVT Prophylaxis - Aspirin Weight bearing as tolerated.  Up with PT for stair training today  Hopeful for discharge home today or tomorrow if meeting goals with PT.   Dennie Bible, PA-C Orthopedic Surgery [phone] 03/29/2023, 7:24 AM

## 2023-03-29 NOTE — Progress Notes (Signed)
Physical Therapy Treatment Patient Details Name: Charles Weiss. MRN: [id number] DOB: 08-25-1951 Today's Date: 03/29/2023   History of Present Illness      PT Comments    Assisted pt from recliner to ambulate in hallway. Assisted pt to EOB and completed sitting and supine exercises, 3 reps only and educated on short arc quads due to pain and R LE weakness. Addressed all mobility questions, discussed appropriate activity, educated on use of ICE.     Recommendations for follow up therapy are one component of a multi-disciplinary discharge planning process, led by the attending physician.  Recommendations may be updated based on patient status, additional functional criteria and insurance authorization.  Follow Up Recommendations       Assistance Recommended at Discharge Intermittent Supervision/Assistance  Patient can return home with the following A little help with bathing/dressing/bathroom;Assistance with cooking/housework;Assist for transportation;Help with stairs or ramp for entrance;A little help with walking and/or transfers   Equipment Recommendations  Rolling walker (2 wheels)    Recommendations for Other Services       Precautions / Restrictions Precautions Precautions: Knee;Fall Precaution Booklet Issued: Yes (comment) Precaution Comments: reviewed no pillow under knee Restrictions Weight Bearing Restrictions: No RLE Weight Bearing: Weight bearing as tolerated Other Position/Activity Restrictions: WBAT     Mobility  Bed Mobility Overal bed mobility: Needs Assistance Bed Mobility: Sit to Supine       Sit to supine: Modified independent (Device/Increase time)   General bed mobility comments: Pt utilized gait belt to self assist (as well as LLE).    Transfers Overall transfer level: Needs assistance Equipment used: Rolling walker (2 wheels) Transfers: Sit to/from Stand Sit to Stand: Min guard           General transfer comment: Increased time  with cueing for LE management and use of UEs to self assist    Ambulation/Gait Ambulation/Gait assistance: Min guard, Supervision Gait Distance (Feet): 40 Feet Assistive device: Rolling walker (2 wheels) Gait Pattern/deviations: Step-to pattern, Decreased step length - right, Antalgic, Trunk flexed, Step-through pattern, Decreased step length - left Gait velocity: decr     General Gait Details: Pt ambulated with RW and minguard, no loss of balance, antalgic gait pattern with heavy bUE use of RW.  Distance ltd by increased pain   Stairs             Wheelchair Mobility    Modified Rankin (Stroke Patients Only)       Balance                                            Cognition Arousal/Alertness: Awake/alert Behavior During Therapy: WFL for tasks assessed/performed Overall Cognitive Status: Within Functional Limits for tasks assessed                                          Exercises Total Joint Exercises Ankle Circles/Pumps: AROM, Both, Supine, 15 reps Quad Sets: AROM, Both, Supine, 5 reps Towel Squeeze: 5 reps, Both, AROM, Supine Short Arc Quad: AAROM, Right, Supine (3 reps, due to pain and R LE weakness) Heel Slides: AAROM, Right, Supine, 5 reps Straight Leg Raises: AAROM, Right, Supine, 5 reps Long Arc Quad: AAROM, Right, Seated, 5 reps    General Comments  Pertinent Vitals/Pain Pain Assessment Pain Assessment: Faces Pain Score: 3  Pain Location: R knee and thigh Pain Descriptors / Indicators: Operative site guarding, Grimacing Pain Intervention(s): Monitored during session, Repositioned, Ice applied    Home Living                          Prior Function            PT Goals (current goals can now be found in the care plan section) Acute Rehab PT Goals Patient Stated Goal: yard work PT Goal Formulation: With patient Time For Goal Achievement: 03/31/23 Potential to Achieve Goals: Good Progress  towards PT goals: Progressing toward goals    Frequency    7X/week      PT Plan Current plan remains appropriate    Co-evaluation              AM-PAC PT "6 Clicks" Mobility   Outcome Measure  Help needed turning from your back to your side while in a flat bed without using bedrails?: A Little Help needed moving from lying on your back to sitting on the side of a flat bed without using bedrails?: A Little Help needed moving to and from a bed to a chair (including a wheelchair)?: A Little Help needed standing up from a chair using your arms (e.g., wheelchair or bedside chair)?: A Little Help needed to walk in hospital room?: A Little   6 Click Score: 15    End of Session Equipment Utilized During Treatment: Gait belt Activity Tolerance: Patient limited by pain Patient left: with call bell/phone within reach;in bed;with bed alarm set   PT Visit Diagnosis: Difficulty in walking, not elsewhere classified (R26.2);Pain Pain - Right/Left: Right Pain - part of body: Knee     Time:  -     Charges:                        Sharlene Motts, SPTA

## 2023-03-29 NOTE — Plan of Care (Signed)
Plan of care reviewed and discussed. °

## 2023-03-30 ENCOUNTER — Other Ambulatory Visit (HOSPITAL_COMMUNITY): Payer: Self-pay

## 2023-03-30 MED ORDER — TIZANIDINE HCL 2 MG PO TABS
2.0000 mg | ORAL_TABLET | Freq: Three times a day (TID) | ORAL | 0 refills | Status: AC | PRN
  Filled 2023-03-30: qty 30, 10d supply, fill #0

## 2023-03-30 MED ORDER — HYDROMORPHONE HCL 2 MG PO TABS
2.0000 mg | ORAL_TABLET | ORAL | 0 refills | Status: AC | PRN
  Filled 2023-03-30: qty 42, 7d supply, fill #0

## 2023-03-30 MED ORDER — ASPIRIN 81 MG PO CHEW
81.0000 mg | CHEWABLE_TABLET | Freq: Two times a day (BID) | ORAL | 0 refills | Status: AC
  Filled 2023-03-30: qty 42, 21d supply, fill #0

## 2023-03-30 MED ORDER — POLYETHYLENE GLYCOL 3350 17 G PO PACK
17.0000 g | PACK | Freq: Two times a day (BID) | ORAL | 0 refills | Status: AC
  Filled 2023-03-30: qty 14, 7d supply, fill #0

## 2023-03-30 NOTE — Progress Notes (Signed)
Physical Therapy Treatment Patient Details Name: Charles Weiss. MRN: [id number] DOB: [birth year] Today's Date: 03/30/2023   History of Present Illness 72 y.o. male admitted 03/24/23 for R TKA. PMH: HTN, asthma, neuropathy, PTSD, B shoulder surgery, COPD, back pain.    PT Comments    Pt making good progress today; gait pattern and wt shift to RLE improving. No physical assist with stairs-supervision for safety- no LOB. Pt has arranged transportation later today.    Recommendations for follow up therapy are one component of a multi-disciplinary discharge planning process, led by the attending physician.  Recommendations may be updated based on patient status, additional functional criteria and insurance authorization.  Follow Up Recommendations       Assistance Recommended at Discharge Intermittent Supervision/Assistance  Patient can return home with the following A little help with bathing/dressing/bathroom;Assistance with cooking/housework;Assist for transportation;Help with stairs or ramp for entrance;A little help with walking and/or transfers   Equipment Recommendations  Rolling walker (2 wheels)    Recommendations for Other Services       Precautions / Restrictions Precautions Precautions: Knee;Fall Precaution Comments: reviewed no pillow under knee Restrictions Weight Bearing Restrictions: No RLE Weight Bearing: Weight bearing as tolerated     Mobility  Bed Mobility Overal bed mobility: Needs Assistance Bed Mobility: Sit to Supine       Sit to supine: Modified independent (Device/Increase time)   General bed mobility comments: Pt utilized gait belt to self assist (as well as LLE).    Transfers Overall transfer level: Needs assistance Equipment used: Rolling walker (2 wheels) Transfers: Sit to/from Stand Sit to Stand: Supervision, Modified independent (Device/Increase time)           General transfer comment: pt self cues for correct hand  placement    Ambulation/Gait Ambulation/Gait assistance: Supervision Gait Distance (Feet): 50 Feet Assistive device: Rolling walker (2 wheels) Gait Pattern/deviations: Step-to pattern, Step-through pattern       General Gait Details: beginning progression to step through pattern with improved wt shift to RLE. no LOB   Stairs Stairs: Yes Stairs assistance: Supervision Stair Management: Step to pattern, Forwards, One rail Right, One rail Left, With cane Number of Stairs: 3 General stair comments: cues for sequence and safety; supervision for safety, no knee buckling, no LOB; pt able to lift RW to top of landing/3rd step and then progress RW fully to landing after ascending 1 step with cane and rail.   Wheelchair Mobility    Modified Rankin (Stroke Patients Only)       Balance                                            Cognition Arousal/Alertness: Awake/alert Behavior During Therapy: WFL for tasks assessed/performed Overall Cognitive Status: Within Functional Limits for tasks assessed                                          Exercises      General Comments        Pertinent Vitals/Pain Pain Assessment Pain Assessment: Faces Faces Pain Scale: Hurts little more Pain Location: R knee and thigh Pain Descriptors / Indicators: Operative site guarding, Grimacing Pain Intervention(s): Limited activity within patient's tolerance, Monitored during session, Premedicated before session, Repositioned, Ice applied  Home Living                          Prior Function            PT Goals (current goals can now be found in the care plan section) Acute Rehab PT Goals Patient Stated Goal: yard work PT Goal Formulation: With patient Time For Goal Achievement: 03/31/23 Potential to Achieve Goals: Good Progress towards PT goals: Progressing toward goals    Frequency    7X/week      PT Plan Current plan remains  appropriate    Co-evaluation              AM-PAC PT "6 Clicks" Mobility   Outcome Measure  Help needed turning from your back to your side while in a flat bed without using bedrails?: A Little Help needed moving from lying on your back to sitting on the side of a flat bed without using bedrails?: A Little Help needed moving to and from a bed to a chair (including a wheelchair)?: A Little Help needed standing up from a chair using your arms (e.g., wheelchair or bedside chair)?: A Little Help needed to walk in hospital room?: A Little Help needed climbing 3-5 steps with a railing? : A Little 6 Click Score: 18    End of Session Equipment Utilized During Treatment: Gait belt Activity Tolerance: Patient tolerated treatment well Patient left: with call bell/phone within reach;in bed;with bed alarm set Nurse Communication: Mobility status PT Visit Diagnosis: Difficulty in walking, not elsewhere classified (R26.2);Pain Pain - Right/Left: Right Pain - part of body: Knee     Time: 7253-6644 PT Time Calculation (min) (ACUTE ONLY): 24 min  Charges:  $Gait Training: 23-37 mins                     Ellamarie Naeve, PT  Acute Rehab Dept [facility] [phone]  03/30/2023    [facility] 03/30/2023, 11:53 AM

## 2023-03-30 NOTE — Progress Notes (Signed)
Patient ID: Phillips Grout., male   DOB: [birth year], 72 y.o.   MRN: [id number] Subjective: 7 Days Post-Op Procedure(s) (LRB): TOTAL KNEE ARTHROPLASTY (Right)    Patient reports pain as mild to moderate as to be expected at this point. No events Continues to work on discharge plans  Objective:   VITALS:   Vitals:   03/29/23 2143 03/30/23 0543  BP: 125/74 (!) 164/86  Pulse: 88 80  Resp: 16 16  Temp: 99.1 F (37.3 C) 98.1 F (36.7 C)  SpO2: 96% 100%    Neurovascular intact Incision: dressing C/D/I  LABS No results for input(s): "HGB", "HCT", "WBC", "PLT" in the last 72 hours.  No results for input(s): "NA", "K", "BUN", "CREATININE", "GLUCOSE" in the last 72 hours.  No results for input(s): "LABPT", "INR" in the last 72 hours.   Assessment/Plan: 7 Days Post-Op Procedure(s) (LRB): TOTAL KNEE ARTHROPLASTY (Right)   Up with therapy Work to get home today TOC may need to be involved with transportation or touching base with his parole officer RTC next week Outpt PT

## 2023-03-31 ENCOUNTER — Other Ambulatory Visit (HOSPITAL_COMMUNITY): Payer: Self-pay

## 2023-04-20 NOTE — Discharge Summary (Signed)
Physician Discharge Summary   Patient ID: Charles Weiss Charles Weiss. MRN: [id number] DOB/AGE: 1951-02-02 72 y.o.  Admit date: 03/23/2023 Discharge date: 03/30/2023  Primary Diagnosis: Right knee osteoarthritis.   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   Weight as of this encounter: 109.3 kg.  Procedure:  Procedure(s) (LRB): TOTAL KNEE ARTHROPLASTY (Right)   Consults: None  HPI: [employer] Adrian Dinovo. is a 72 y.o. male patient of   mine.  The patient had been seen, evaluated, and treated for months conservatively in the   office with medication, activity modification, and injections.  The patient had   radiographic changes of bone-on-bone arthritis with endplate sclerosis and osteophytes noted.  Based on the radiographic changes and failed conservative measures, the patient   decided to proceed with definitive treatment, total knee replacement.  Risks of infection, DVT, component failure, need for revision surgery, neurovascular injury were reviewed in the office setting.  The postop course was reviewed stressing the efforts to maximize post-operative satisfaction and function.  Consent was obtained for benefit of pain   relief.      Laboratory Data: Admission on 03/23/2023, Discharged on 03/30/2023  Component Date Value Ref Range Status   WBC 03/24/2023  5.7  4.0 - 10.5 K/uL Final   RBC 03/24/2023 3.46 (L)  4.22 - 5.81 MIL/uL Final   Hemoglobin 03/24/2023 10.3 (L)  13.0 - 17.0 g/dL Final   HCT 40/98/1191 31.1 (L)  39.0 - 52.0 % Final   MCV 03/24/2023 89.9  80.0 - 100.0 fL Final   MCH 03/24/2023 29.8  26.0 - 34.0 pg Final   MCHC 03/24/2023 33.1  30.0 - 36.0 g/dL Final   RDW 47/82/9562 14.5  11.5 - 15.5 % Final   Platelets 03/24/2023 212  150 - 400 K/uL Final   nRBC 03/24/2023 0.0  0.0 - 0.2 % Final   Performed at [facility], [address], [place], [place] [address]   Sodium 03/24/2023 136  135 - 145 mmol/L Final   Potassium 03/24/2023 4.1  3.5 - 5.1 mmol/L Final   Chloride 03/24/2023 103  98 - 111 mmol/L Final   CO2 03/24/2023 25  22 - 32 mmol/L Final   Glucose, Bld 03/24/2023 148 (H)  70 - 99 mg/dL Final   Glucose reference range applies only to samples taken after fasting for at least 8 hours.   BUN 03/24/2023 12  8 - 23 mg/dL Final   Creatinine, Ser 03/24/2023 0.91  0.61 - 1.24 mg/dL Final   Calcium 57/84/6962 8.6 (L)  8.9 - 10.3 mg/dL Final   GFR, Estimated 03/24/2023 >60  >60 mL/min Final   Comment: (NOTE) Calculated using the CKD-EPI Creatinine Equation (2021)    Anion gap 03/24/2023 8  5 - 15  Final   Performed at [facility], [address], [place], [place] [address]   WBC 03/25/2023 6.0  4.0 - 10.5 K/uL Final   RBC 03/25/2023 3.40 (L)  4.22 - 5.81 MIL/uL Final   Hemoglobin 03/25/2023 10.1 (L)  13.0 - 17.0 g/dL Final   HCT 60/45/4098 30.5 (L)  39.0 - 52.0 % Final   MCV 03/25/2023 89.7  80.0 - 100.0 fL Final   MCH 03/25/2023 29.7  26.0 - 34.0 pg Final   MCHC 03/25/2023 33.1  30.0 - 36.0 g/dL Final   RDW 11/91/4782 14.6  11.5 - 15.5 % Final   Platelets 03/25/2023 210  150 - 400 K/uL Final   nRBC 03/25/2023 0.0  0.0 - 0.2 % Final   Performed at [facility], [address] [place], [place] [address]  Hospital Outpatient Visit on 03/12/2023  Component Date Value  Ref Range Status   Sodium 03/12/2023 136  135 - 145 mmol/L Final   Potassium 03/12/2023 4.2  3.5 - 5.1 mmol/L Final   Chloride 03/12/2023 102  98 - 111 mmol/L Final   CO2 03/12/2023 26  22 - 32 mmol/L Final   Glucose, Bld 03/12/2023 122 (H)  70 - 99 mg/dL Final   Glucose reference range applies only to samples taken after fasting for at least 8 hours.   BUN 03/12/2023 13  8 - 23 mg/dL Final   Creatinine, Ser 03/12/2023 0.86  0.61 - 1.24 mg/dL Final   Calcium 30/86/5784 9.0  8.9 - 10.3 mg/dL Final   GFR, Estimated 03/12/2023 >60  >60 mL/min Final   Comment: (NOTE) Calculated using the CKD-EPI Creatinine Equation (2021)    Anion gap 03/12/2023 8  5 - 15 Final   Performed at [facility], [address] [place], [place] [address]   WBC 03/12/2023 4.4  4.0 - 10.5 K/uL Final   RBC 03/12/2023 4.11 (L)  4.22 - 5.81 MIL/uL Final   Hemoglobin 03/12/2023 12.3 (L)  13.0 - 17.0 g/dL Final   HCT 52/84/1324 37.7 (L)  39.0 - 52.0 % Final   MCV 03/12/2023 91.7  80.0 - 100.0 fL Final   MCH 03/12/2023 29.9  26.0 - 34.0 pg Final   MCHC 03/12/2023 32.6  30.0 - 36.0 g/dL Final   RDW 40/09/2724 14.8  11.5 - 15.5 % Final   Platelets 03/12/2023 231  150 - 400 K/uL Final   nRBC 03/12/2023 0.0  0.0 - 0.2 % Final   Performed at [facility], [address] [place], [place] [address]   MRSA, PCR 03/12/2023 POSITIVE (A)  NEGATIVE Final   Comment: CRITICAL RESULT CALLED TO, READ BACK BY AND VERIFIED WITH: MINOR, A RN @ 1743 03/12/23. GILBERTL    Staphylococcus aureus 03/12/2023 POSITIVE (A)  NEGATIVE Final   Comment: CRITICAL RESULT CALLED TO, READ BACK BY AND VERIFIED WITH: MINOR, A RN @ 1743 03/12/23. GILBERTL (NOTE) The Xpert SA Assay (FDA approved for NASAL specimens in patients 81 years of age and older), is one component of a comprehensive surveillance program. It is not intended to diagnose infection nor to guide or monitor treatment. Performed at [facility], [address], [place], [place] [address]    Glucose-Capillary 03/12/2023 180 (H)  70 - 99 mg/dL Final   Glucose reference range applies only to samples taken after fasting for at least 8 hours.   Hgb A1c MFr Bld 03/12/2023 6.3 (H)  4.8 - 5.6 %  Final   Comment: (NOTE)         Prediabetes: 5.7 - 6.4         Diabetes: >6.4         Glycemic control for adults with diabetes: <7.0    Mean Plasma Glucose 03/12/2023 134  mg/dL Final   Comment: (NOTE) Performed At: [facility] [address] [place], [place] [id number] Jolene Schimke MD WG:[id number]      X-Rays:No results found.  EKG: Orders placed or performed during the hospital encounter of 03/12/23   EKG 12 lead per protocol   EKG 12 lead per protocol     Hospital Course: Charles Weiss Kotaro Buer. is a 72 y.o. who was admitted to [facility]. They were brought to the operating room on 03/23/2023 and underwent Procedure(s): TOTAL KNEE ARTHROPLASTY.  Patient tolerated the procedure well and was later transferred to the recovery room and then to the orthopaedic floor for postoperative care. They were given PO and IV analgesics for pain control following their surgery. They were given 24 hours of postoperative antibiotics of  Anti-infectives (From admission, onward)    Start     Dose/Rate Route Frequency Ordered Stop   03/23/23 1930  ceFAZolin (ANCEF) IVPB 2g/100 mL premix        2 g 200 mL/hr over 30 Minutes Intravenous Every 6 hours 03/23/23 1702 03/24/23 0103   03/23/23 0945  ceFAZolin (ANCEF) IVPB 2g/100 mL premix        2 g 200 mL/hr over 30 Minutes Intravenous On call to O.R. 03/23/23 0936 03/23/23 1337   03/23/23 0945  vancomycin (VANCOREADY) IVPB 1500 mg/300 mL        1,500 mg 150 mL/hr over 120 Minutes Intravenous On call to O.R. 03/23/23 7846 03/23/23 1327      and started on DVT prophylaxis in the form of Aspirin.   PT and OT were ordered for total joint protocol. Discharge  planning consulted to help with postop disposition and equipment needs. Patient had a fair night on the evening of surgery. They started to get up OOB with therapy on POD #1 but was limited by pain.  Continued to work with therapy into POD #2. His plans for help at home fell through, and he instead decided to pursue SNF. After discussion with social work, it was determined he would not be a candidate for SNF based on prior criminal record. We instead focused on acute inpatient therapy until he could manage ADLs at home. Patient worked on this until POD #7 when he was meeting his goals.   Pt was seen during rounds on day seven and was ready to go home pending progress with therapy. Pt worked with therapy for one additional session and was meeting their goals. HE was discharged to home later that day in stable condition.  Diet: Regular diet Activity: WBAT Follow-up: in 2 weeks Disposition: Home Discharged Condition: good   Discharge Instructions     Call MD / Call 911   Complete by: As directed    If you experience chest pain or shortness of breath, CALL 911 and be transported to the hospital emergency room.  If you develope a fever above 101 F, pus (white drainage) or increased drainage or redness at the wound, or calf pain, call your surgeon's office.   Change dressing   Complete by: As directed    Maintain surgical dressing until follow up in the clinic. If the edges start to pull up, may reinforce  with tape. If the dressing is no longer working, may remove and cover with gauze and tape, but must keep the area dry and clean.  Call with any questions or concerns.   Constipation Prevention   Complete by: As directed    Drink plenty of fluids.  Prune juice may be helpful.  You may use a stool softener, such as Colace (over the counter) 100 mg twice a day.  Use MiraLax (over the counter) for constipation as needed.   Diet - low sodium heart healthy   Complete by: As directed    Increase activity  slowly as tolerated   Complete by: As directed    Weight bearing as tolerated with assist device (walker, cane, etc) as directed, use it as long as suggested by your surgeon or therapist, typically at least 4-6 weeks.   Post-operative opioid taper instructions:   Complete by: As directed    POST-OPERATIVE OPIOID TAPER INSTRUCTIONS: It is important to wean off of your opioid medication as soon as possible. If you do not need pain medication after your surgery it is ok to stop day one. Opioids include: Codeine, Hydrocodone(Norco, Vicodin), Oxycodone(Percocet, oxycontin) and hydromorphone amongst others.  Long term and even short term use of opiods can cause: Increased pain response Dependence Constipation Depression Respiratory depression And more.  Withdrawal symptoms can include Flu like symptoms Nausea, vomiting And more Techniques to manage these symptoms Hydrate well Eat regular healthy meals Stay active Use relaxation techniques(deep breathing, meditating, yoga) Do Not substitute Alcohol to help with tapering If you have been on opioids for less than two weeks and do not have pain than it is ok to stop all together.  Plan to wean off of opioids This plan should start within one week post op of your joint replacement. Maintain the same interval or time between taking each dose and first decrease the dose.  Cut the total daily intake of opioids by one tablet each day Next start to increase the time between doses. The last dose that should be eliminated is the evening dose.      TED hose   Complete by: As directed    Use stockings (TED hose) for 2 weeks on both leg(s).  You may remove them at night for sleeping.      Allergies as of 03/30/2023       Reactions   Elavil [amitriptyline] Other (See Comments)   "Heart beat funny and made me feel jittery"   Iodinated Contrast Media Other (See Comments)   " Makes me feel warm and uncomfortable"   Tetracyclines & Related Other  (See Comments)   Nausea and heartburn        Medication List     STOP taking these medications    acetaminophen 650 MG CR tablet Commonly known as: TYLENOL   aspirin EC 81 MG tablet Replaced by: aspirin 81 MG chewable tablet   benzonatate 100 MG capsule Commonly known as: TESSALON   chlorpheniramine-HYDROcodone 10-8 MG/5ML Suer Commonly known as: TUSSIONEX   dextromethorphan 30 MG/5ML liquid Commonly known as: DELSYM   dextromethorphan-guaiFENesin 30-600 MG 12hr tablet Commonly known as: MUCINEX DM   oxyCODONE 15 MG immediate release tablet Commonly known as: ROXICODONE   oxycodone 5 MG capsule Commonly known as: OXY-IR   tizanidine 2 MG capsule Commonly known as: Zanaflex Replaced by: tiZANidine 2 MG tablet   Vitamin D3 25 MCG (1000 UT) Caps       TAKE these medications    albuterol  108 (90 Base) MCG/ACT inhaler Commonly known as: VENTOLIN HFA Inhale 1 puff into the lungs every 6 (six) hours as needed for wheezing or shortness of breath. What changed: Another medication with the same name was changed. Make sure you understand how and when to take each.   albuterol (2.5 MG/3ML) 0.083% nebulizer solution Commonly known as: PROVENTIL Take 3 mLs by nebulization 3 (three) times daily. What changed:  when to take this reasons to take this   amLODipine 5 MG tablet Commonly known as: NORVASC Take 5 mg by mouth daily.   aspirin 81 MG chewable tablet Chew 1 tablet (81 mg total) by mouth 2 (two) times daily for 21 days. Replaces: aspirin EC 81 MG tablet   busPIRone 10 MG tablet Commonly known as: BUSPAR Take 10 mg by mouth 2 (two) times daily.   ciclesonide 80 MCG/ACT inhaler Commonly known as: ALVESCO Inhale 1 puff into the lungs as needed.   docusate sodium 100 MG capsule Commonly known as: COLACE Take 100 mg by mouth 2 (two) times daily as needed for mild constipation.   gabapentin 800 MG tablet Commonly known as: NEURONTIN Take 800 mg by mouth  3 (three) times daily. What changed: Another medication with the same name was removed. Continue taking this medication, and follow the directions you see here.   hydrochlorothiazide 12.5 MG tablet Commonly known as: HYDRODIURIL Take 12.5 mg by mouth as needed.   HYDROmorphone 2 MG tablet Commonly known as: DILAUDID Take 1 - 2 tablets (2 - 4 mg total) by mouth every 4 hours as needed for moderate pain or severe pain.   hydroxypropyl methylcellulose / hypromellose 2.5 % ophthalmic solution Commonly known as: ISOPTO TEARS / GONIOVISC Place 1 drop into both eyes as needed for dry eyes.   ketotifen 0.025 % ophthalmic solution Commonly known as: ZADITOR Place 1 drop into both eyes 2 (two) times daily as needed (allergies).   losartan 50 MG tablet Commonly known as: COZAAR Take 50 mg by mouth daily.   methocarbamol 500 MG tablet Commonly known as: ROBAXIN Take 1 tablet (500 mg total) by mouth every 6 (six) hours as needed for muscle spasms. What changed:  medication strength how much to take when to take this reasons to take this   mometasone 220 MCG/ACT inhaler Commonly known as: ASMANEX Inhale 2 puffs into the lungs as needed.   montelukast 10 MG tablet Commonly known as: SINGULAIR Take 10 mg by mouth at bedtime.   omeprazole 20 MG capsule Commonly known as: PRILOSEC Take 40 mg by mouth at bedtime.   pantoprazole 40 MG tablet Commonly known as: PROTONIX Take 40 mg by mouth daily.   polyethylene glycol 17 g packet Commonly known as: MIRALAX / GLYCOLAX Take 17 g by mouth 2 (two) times daily. What changed: when to take this   QUEtiapine 25 MG tablet Commonly known as: SEROQUEL Take 1 tablet (25 mg total) by mouth at bedtime.   rosuvastatin 5 MG tablet Commonly known as: CRESTOR Take 5 mg by mouth daily.   senna 8.6 MG Tabs tablet Commonly known as: SENOKOT Take 1 tablet by mouth at bedtime as needed for mild constipation.   SYSTANE BALANCE OP Apply 1 drop to  eye as needed.   tetrahydrozoline-zinc 0.05-0.25 % ophthalmic solution Commonly known as: VISINE-AC 2 drops 3 (three) times daily as needed (dry eye).   tiZANidine 2 MG tablet Commonly known as: ZANAFLEX Take 1 tablet (2 mg total) by mouth every 8 hours as  needed for muscle spasms (muscle pain). Replaces: tizanidine 2 MG capsule   traZODone 50 MG tablet Commonly known as: DESYREL Take 50 mg by mouth at bedtime as needed for sleep.   Trelegy Ellipta 100-62.5-25 MCG/ACT Aepb Generic drug: Fluticasone-Umeclidin-Vilant Inhale 1 puff into the lungs as needed.   Wixela Inhub 250-50 MCG/ACT Aepb Generic drug: fluticasone-salmeterol Inhale 1 puff into the lungs as needed.               Discharge Care Instructions  (From admission, onward)           Start     Ordered   03/24/23 0000  Change dressing       Comments: Maintain surgical dressing until follow up in the clinic. If the edges start to pull up, may reinforce with tape. If the dressing is no longer working, may remove and cover with gauze and tape, but must keep the area dry and clean.  Call with any questions or concerns.   03/24/23 1610            Follow-up Information     Durene Romans, MD. Schedule an appointment as soon as possible for a visit in 2 week(s).   Specialty: Orthopedic Surgery Contact information: [address] [place] [address] [phone]                 Signed: Jahaan Vanwagner, PA-C Orthopedic Surgery 04/20/2023, 7:08 AM
